# Patient Record
Sex: Male | Born: 1960 | Race: Black or African American | Hispanic: No | Marital: Single | Smoking: Current every day smoker
Health system: Southern US, Community
[De-identification: ages and names within clinical notes are randomized; demographics above are authoritative.]

## PROBLEM LIST (undated history)

## (undated) DIAGNOSIS — F1910 Other psychoactive substance abuse, uncomplicated: Secondary | ICD-10-CM

## (undated) DIAGNOSIS — R933 Abnormal findings on diagnostic imaging of other parts of digestive tract: Secondary | ICD-10-CM

## (undated) DIAGNOSIS — R109 Unspecified abdominal pain: Secondary | ICD-10-CM

## (undated) DIAGNOSIS — K219 Gastro-esophageal reflux disease without esophagitis: Secondary | ICD-10-CM

## (undated) HISTORY — PX: OTHER SURGICAL HISTORY: SHX169

---

## 2005-04-09 ENCOUNTER — Emergency Department (HOSPITAL_COMMUNITY): Admission: EM | Admit: 2005-04-09 | Discharge: 2005-04-09 | Payer: Self-pay | Admitting: Emergency Medicine

## 2006-12-05 ENCOUNTER — Emergency Department (HOSPITAL_COMMUNITY): Admission: EM | Admit: 2006-12-05 | Discharge: 2006-12-05 | Payer: Self-pay | Admitting: Emergency Medicine

## 2012-02-14 ENCOUNTER — Encounter (HOSPITAL_COMMUNITY): Payer: Self-pay | Admitting: Emergency Medicine

## 2012-02-14 ENCOUNTER — Emergency Department (HOSPITAL_COMMUNITY)
Admission: EM | Admit: 2012-02-14 | Discharge: 2012-02-15 | Disposition: A | Discharge status: Home / Self Care | Payer: Medicaid Other | Attending: Emergency Medicine | Admitting: Emergency Medicine

## 2012-02-14 DIAGNOSIS — M545 Low back pain, unspecified: Secondary | ICD-10-CM | POA: Insufficient documentation

## 2012-02-14 DIAGNOSIS — M25519 Pain in unspecified shoulder: Secondary | ICD-10-CM | POA: Insufficient documentation

## 2012-02-14 DIAGNOSIS — R109 Unspecified abdominal pain: Secondary | ICD-10-CM | POA: Insufficient documentation

## 2012-02-14 NOTE — ED Notes (Addendum)
Pt c/o intermittent right side abd pain that radiates to lower back and shoulder, that has been persistent for years, but pain has worsened over the last couple days.  Pain is worse during while sleeping and during movement.  Denies nausea and vomiting or burning on urination.

## 2012-02-15 MED ORDER — FENTANYL CITRATE 0.05 MG/ML IJ SOLN: 100.0000 ug | Freq: Once | INTRAMUSCULAR | Status: DC

## 2012-03-01 DIAGNOSIS — F1910 Other psychoactive substance abuse, uncomplicated: Secondary | ICD-10-CM

## 2012-03-01 DIAGNOSIS — R933 Abnormal findings on diagnostic imaging of other parts of digestive tract: Secondary | ICD-10-CM

## 2012-03-01 HISTORY — DX: Other psychoactive substance abuse, uncomplicated: F19.10

## 2012-03-01 HISTORY — DX: Abnormal findings on diagnostic imaging of other parts of digestive tract: R93.3

## 2012-03-02 ENCOUNTER — Emergency Department (HOSPITAL_COMMUNITY): Payer: Medicaid Other

## 2012-03-02 ENCOUNTER — Emergency Department (HOSPITAL_COMMUNITY)
Admission: EM | Admit: 2012-03-02 | Discharge: 2012-03-02 | Disposition: A | Discharge status: Home / Self Care | Payer: Medicaid Other | Attending: Emergency Medicine | Admitting: Emergency Medicine

## 2012-03-02 ENCOUNTER — Encounter (HOSPITAL_COMMUNITY): Payer: Self-pay | Admitting: Radiology

## 2012-03-02 DIAGNOSIS — D72829 Elevated white blood cell count, unspecified: Secondary | ICD-10-CM | POA: Insufficient documentation

## 2012-03-02 DIAGNOSIS — F17200 Nicotine dependence, unspecified, uncomplicated: Secondary | ICD-10-CM | POA: Insufficient documentation

## 2012-03-02 DIAGNOSIS — N390 Urinary tract infection, site not specified: Secondary | ICD-10-CM | POA: Insufficient documentation

## 2012-03-02 DIAGNOSIS — K219 Gastro-esophageal reflux disease without esophagitis: Secondary | ICD-10-CM

## 2012-03-02 LAB — URINALYSIS, ROUTINE W REFLEX MICROSCOPIC
Glucose, UA: NEGATIVE mg/dL
Hgb urine dipstick: NEGATIVE
Protein, ur: 100 mg/dL — AB
pH: 6 (ref 5.0–8.0)

## 2012-03-02 LAB — COMPREHENSIVE METABOLIC PANEL
ALT: 13 U/L (ref 0–53)
AST: 24 U/L (ref 0–37)
Albumin: 4 g/dL (ref 3.5–5.2)
CO2: 30 mEq/L (ref 19–32)
Calcium: 9.8 mg/dL (ref 8.4–10.5)
Chloride: 96 mEq/L (ref 96–112)
GFR calc non Af Amer: >90 mL/min (ref >90)
Sodium: 138 mEq/L (ref 135–145)
Total Bilirubin: 0.7 mg/dL (ref 0.3–1.2)

## 2012-03-02 LAB — RAPID URINE DRUG SCREEN, HOSP PERFORMED
Amphetamines: NOT DETECTED
Benzodiazepines: NOT DETECTED
Opiates: NOT DETECTED
Tetrahydrocannabinol: NOT DETECTED

## 2012-03-02 LAB — URINE MICROSCOPIC-ADD ON

## 2012-03-02 LAB — CBC WITH DIFFERENTIAL/PLATELET
Basophils Absolute: 0 10*3/uL (ref 0.0–0.1)
Lymphocytes Relative: 6 % — ABNORMAL LOW (ref 12–46)
Neutro Abs: 19.2 10*3/uL — ABNORMAL HIGH (ref 1.7–7.7)
Platelets: 162 10*3/uL (ref 150–400)
RDW: 12.8 % (ref 11.5–15.5)
WBC: 22.2 10*3/uL — ABNORMAL HIGH (ref 4.0–10.5)

## 2012-03-02 MED ORDER — SODIUM CHLORIDE 0.9 % IV BOLUS (SEPSIS)
2000.0000 mL | Freq: Once | INTRAVENOUS | Status: AC
  Administered 2012-03-02: 1000 mL via INTRAVENOUS

## 2012-03-02 MED ORDER — HYDROCODONE-ACETAMINOPHEN 5-325 MG PO TABS: 2.0000 | ORAL_TABLET | ORAL | Status: AC | PRN

## 2012-03-02 MED ORDER — HYDROMORPHONE HCL PF 1 MG/ML IJ SOLN
1.0000 mg | Freq: Once | INTRAMUSCULAR | Status: DC
  Filled 2012-03-02: qty 1

## 2012-03-02 MED ORDER — HYDROMORPHONE HCL PF 1 MG/ML IJ SOLN: 1.0000 mg | Freq: Once | INTRAMUSCULAR | Status: DC

## 2012-03-02 MED ORDER — NITROFURANTOIN MONOHYD MACRO 100 MG PO CAPS: 100.0000 mg | ORAL_CAPSULE | Freq: Two times a day (BID) | ORAL | Status: AC

## 2012-03-02 MED ORDER — HYDROMORPHONE HCL PF 1 MG/ML IJ SOLN
1.0000 mg | Freq: Once | INTRAMUSCULAR | Status: AC
  Administered 2012-03-02: 1 mg via INTRAMUSCULAR

## 2012-03-02 MED ORDER — PANTOPRAZOLE SODIUM 20 MG PO TBEC: 20.0000 mg | DELAYED_RELEASE_TABLET | Freq: Every day | ORAL | Status: DC

## 2012-03-02 MED ORDER — ONDANSETRON HCL 4 MG/2ML IJ SOLN
4.0000 mg | Freq: Once | INTRAMUSCULAR | Status: DC
  Filled 2012-03-02 (×2): qty 2

## 2012-03-02 MED ORDER — IOHEXOL 300 MG/ML  SOLN
20.0000 mL | INTRAMUSCULAR | Status: AC
  Administered 2012-03-02: 20 mL via ORAL

## 2012-03-02 MED ORDER — SODIUM CHLORIDE 0.9 % IV SOLN
INTRAVENOUS | Status: DC
  Administered 2012-03-02: 125 mL/h via INTRAVENOUS

## 2012-03-02 MED ORDER — HYDROMORPHONE HCL PF 1 MG/ML IJ SOLN
1.0000 mg | Freq: Once | INTRAMUSCULAR | Status: AC
  Administered 2012-03-02: 1 mg via INTRAVENOUS
  Filled 2012-03-02: qty 1

## 2012-03-02 MED ORDER — IOHEXOL 300 MG/ML  SOLN
80.0000 mL | Freq: Once | INTRAMUSCULAR | Status: AC | PRN
  Administered 2012-03-02: 80 mL via INTRAVENOUS

## 2012-03-02 NOTE — ED Notes (Signed)
Pt brought in via EMS from [facility] store where he was "hanging out" with nausea/vomiting x1 day. Pt requested pain medication from EMS and was denied Pain medication by EMS pt then refused to come to ED

## 2012-03-02 NOTE — ED Provider Notes (Signed)
History     CSN: [id number]  Arrival date & time 03/02/12  1324   First MD Initiated Contact with Patient 03/02/12 1505      Chief Complaint  Patient presents with  . Abdominal Pain    (Consider location/radiation/quality/duration/timing/severity/associated sxs/prior treatment) HPI Pt presenting with c/o epigastric pain as well as nausea and vomiting. He states the pain is sharp and constant.  Has been present for several weeks but became worse today.  Emesis is nonbilious and nonbloody.  No diarrhea.  No fever/chills.  States certain foods make symptoms worse like fruits and vegetables.  There are no other associated systemic symptoms, there are no other alleviating or modifying factors. He denes trying anything for pain prior to arrival.  Pt brought in by EMS.   History reviewed. No pertinent past medical history.  History reviewed. No pertinent past surgical history.  History reviewed. No pertinent family history.  History  Substance Use Topics  . Smoking status: Current Everyday Smoker  . Smokeless tobacco: Not on file  . Alcohol Use: Yes      Review of Systems ROS reviewed and all otherwise negative except for mentioned in HPI  Allergies  Review of patient's allergies indicates no known allergies.  Home Medications   Current Outpatient Rx  Name Route Sig Dispense Refill  . HYDROCODONE-ACETAMINOPHEN 5-325 MG PO TABS Oral Take 2 tablets by mouth every 4 (four) hours as needed for pain. 10 tablet 0  . NITROFURANTOIN MONOHYD MACRO 100 MG PO CAPS Oral Take 1 capsule (100 mg total) by mouth 2 (two) times daily. 10 capsule 0  . PANTOPRAZOLE SODIUM 20 MG PO TBEC Oral Take 1 tablet (20 mg total) by mouth daily. 30 tablet 0    BP 145/89  Pulse 93  Temp 98 F (36.7 C) (Oral)  Resp 18  SpO2 100% Vitals reviewed Physical Exam Physical Examination: General appearance - alert, uncomfortable appearing, and in no distress Mental status - alert, oriented to person, place,  and time Eyes - pupils equal and reactive, no conjunctival injection, no scleral icterus Mouth - mucous membranes moist, pharynx normal without lesions Chest - clear to auscultation, no wheezes, rales or rhonchi, symmetric air entry Heart - normal rate, regular rhythm, normal S1, S2, no murmurs, rubs, clicks or gallops Abdomen - soft, mild ttp in epigastric region, no gaurding or rebound tenderness, nondistended, no masses or organomegaly Extremities - peripheral pulses normal, no pedal edema, no clubbing or cyanosis Skin - normal coloration and turgor, no rashes, no suspicious skin lesions noted Psych- agitated, disgruntled  ED Course  Procedures (including critical care time)  3:09 PM went to see patient, he is not in room  4:58 PM  D/w Doran Durand, PA who will cover patient in CDU  Labs Reviewed  CBC WITH DIFFERENTIAL - Abnormal; Notable for the following:    WBC 22.2 (*)     Hemoglobin 17.1 (*)     Neutrophils Relative 87 (*)     Neutro Abs 19.2 (*)     Lymphocytes Relative 6 (*)     Monocytes Absolute 1.7 (*)     All other components within normal limits  COMPREHENSIVE METABOLIC PANEL - Abnormal; Notable for the following:    Glucose, Bld 107 (*)     All other components within normal limits  URINALYSIS, ROUTINE W REFLEX MICROSCOPIC - Abnormal; Notable for the following:    Color, Urine AMBER (*)  BIOCHEMICALS MAY BE AFFECTED BY COLOR   APPearance HAZY (*)  Bilirubin Urine SMALL (*)     Ketones, ur 15 (*)     Protein, ur 100 (*)     Leukocytes, UA SMALL (*)     All other components within normal limits  URINE RAPID DRUG SCREEN (HOSP PERFORMED) - Abnormal; Notable for the following:    Cocaine POSITIVE (*)     All other components within normal limits  URINE MICROSCOPIC-ADD ON - Abnormal; Notable for the following:    Casts HYALINE CASTS (*)     All other components within normal limits  LIPASE, BLOOD  ETHANOL  URINE CULTURE   Ct Abdomen Pelvis W  Contrast  03/02/2012  *RADIOLOGY REPORT*  Clinical Data: Abdominal pain.  Leukocytosis.  CT ABDOMEN AND PELVIS WITH CONTRAST  Technique:  Multidetector CT imaging of the abdomen and pelvis was performed following the standard protocol during bolus administration of intravenous contrast.  Contrast: 80mL OMNIPAQUE IOHEXOL 300 MG/ML  SOLN  Comparison: No priors.  Findings:  Lung Bases: Marked asymmetric circumferential thickening of the distal esophagus.  Abdomen/Pelvis:  The appearance of the liver, gallbladder, pancreas, spleen, bilateral adrenal glands and bilateral kidneys is unremarkable.  No ascites or pneumoperitoneum and no pathologic distension of bowel.  There is some moderate gaseous distension throughout the colon.  Prostate and urinary bladder are unremarkable in appearance.  Incompletely distended right testicle in the right inguinal canal.  Musculoskeletal: There are no aggressive appearing lytic or blastic lesions noted in the visualized portions of the skeleton.  IMPRESSION: 1.  No acute findings in the abdomen or pelvis to account for the patient's symptoms. 2. However, there is profound asymmetric thickening of the distal esophagus.  While this may simply relate to reflux esophagitis, clinical correlation is recommended.  If there is any clinical concern for Barrett's metaplasia or esophageal neoplasia, further evaluation with endoscopy may be warranted. 3.  Incompletely distended right testicle localized in the right inguinal canal.  This places the patient at increased risk for development of testicular carcinoma, and appropriate clinical surveillance is recommended in the future.  Original Report Authenticated By: Florencia Reasons, M.D.   Dg Abd Acute W/chest  03/02/2012  *RADIOLOGY REPORT*  Clinical Data: Chest pain.  ACUTE ABDOMEN SERIES (ABDOMEN 2 VIEW & CHEST 1 VIEW)  Comparison: None.  Findings: Heart is normal size.  Scarring in the left upper lobe. Right lung is clear.  No effusions.   Mild diffuse gaseous distention of bowel, predominately large bowel.  This may reflect mild ileus.  No free air organomegaly.  No suspicious calcification or acute bony abnormality.  IMPRESSION: Mild gaseous distention of bowel, possibly mild ileus.  Left apical scarring.  Original Report Authenticated By: Cyndie Chime, M.D.     1. GERD (gastroesophageal reflux disease)   2. UTI (urinary tract infection)       MDM  Patient with epigastric pain which has been going on for the past several weeks. He does have an elevated white blood cell count and CT scan was obtained which shows esophagitis. He also has white blood cells in his urine consistent with urinary tract infection. There are no other acute abnormalities on a CT scan or laboratory results. He was apprised of the abnormality in the distal esophagus and given information for followup with GI. He was also started on antibiotics for his urinary tract infection.        Ethelda Chick, MD 03/03/12 2059

## 2012-03-02 NOTE — ED Provider Notes (Signed)
Medical screening exam: Patient presents per EMS, for evaluation of right upper abdominal and flank pain present for several weeks. He asked EMS, for pain medicine. He was taken to triage, where he had a episode of possible syncope. After that, he was brought to the back. He then chose to lie on the floor. He is irritable, but answers questions. They're to family members with him were also somewhat irritable, but cooperative. Vital signs are normal. Screening evaluation is ordered. He'll be given medications for pain, and nausea during the course of the initial evaluation.  Flint Melter, MD 03/02/12 [phone]

## 2012-03-02 NOTE — ED Provider Notes (Signed)
6:15 PM Assumed care of patient in the CDU from Dr. Karma Ganja.  Patient presents today with a chief complaint of nausea, vomiting, and abdominal pain for one day. Patient found to have an elevated WBC of 22, 000 on CBC.   Plan is for patient to get an Ab/Pelvis CT.  Dispo will be determined once the CT has been resulted.  Reassessed patient.  He reports that he is still having pain.  Patient alert and orientated, Heart RRR, Lungs CTAB, Abdomen soft with tenderness to palpation of the epigastrium, No rebound or guarding.    7:45 PM Results of the CT as follows:  1.  No acute findings in the abdomen or pelvis to account for the  patient's symptoms.  2. However, there is profound asymmetric thickening of the distal  esophagus. While this may simply relate to reflux esophagitis,  clinical correlation is recommended. If there is any clinical  concern for Barrett's metaplasia or esophageal neoplasia, further  evaluation with endoscopy may be warranted.  Discussed results of the CT with patient.  Patient will be discharged home with antibiotics to treat UTI and will be discharged with Protonix for GERD.    Pascal Lux [place], PA-C 03/03/12 0149  Pascal Lux [place], PA-C 03/03/12 [phone]

## 2012-03-03 NOTE — ED Provider Notes (Signed)
Medical screening examination/treatment/procedure(s) were conducted as a shared visit with non-physician practitioner(s) and myself.  I personally evaluated the patient during the encounter  I saw this patient initially.   Ethelda Chick, MD 03/03/12 1745

## 2012-03-04 LAB — URINE CULTURE
Colony Count: NO GROWTH
Culture: NO GROWTH

## 2012-08-09 ENCOUNTER — Encounter (HOSPITAL_COMMUNITY): Admission: EM | Disposition: A | Discharge status: Home / Self Care | Payer: Self-pay | Source: Home / Self Care

## 2012-08-09 ENCOUNTER — Encounter (HOSPITAL_COMMUNITY): Payer: Self-pay | Admitting: Emergency Medicine

## 2012-08-09 ENCOUNTER — Inpatient Hospital Stay (HOSPITAL_COMMUNITY)
Admission: EM | Admit: 2012-08-09 | Discharge: 2012-08-20 | DRG: 326 | Disposition: A | Discharge status: Home / Self Care | Payer: Medicaid Other | Attending: General Surgery | Admitting: General Surgery

## 2012-08-09 ENCOUNTER — Emergency Department (HOSPITAL_COMMUNITY): Payer: Medicaid Other

## 2012-08-09 DIAGNOSIS — K264 Chronic or unspecified duodenal ulcer with hemorrhage: Principal | ICD-10-CM | POA: Diagnosis present

## 2012-08-09 DIAGNOSIS — K922 Gastrointestinal hemorrhage, unspecified: Secondary | ICD-10-CM

## 2012-08-09 DIAGNOSIS — F1010 Alcohol abuse, uncomplicated: Secondary | ICD-10-CM

## 2012-08-09 DIAGNOSIS — D62 Acute posthemorrhagic anemia: Secondary | ICD-10-CM | POA: Diagnosis not present

## 2012-08-09 DIAGNOSIS — R55 Syncope and collapse: Secondary | ICD-10-CM

## 2012-08-09 DIAGNOSIS — K269 Duodenal ulcer, unspecified as acute or chronic, without hemorrhage or perforation: Secondary | ICD-10-CM

## 2012-08-09 DIAGNOSIS — R578 Other shock: Secondary | ICD-10-CM | POA: Diagnosis present

## 2012-08-09 DIAGNOSIS — F17200 Nicotine dependence, unspecified, uncomplicated: Secondary | ICD-10-CM | POA: Diagnosis present

## 2012-08-09 DIAGNOSIS — K920 Hematemesis: Secondary | ICD-10-CM

## 2012-08-09 DIAGNOSIS — J9600 Acute respiratory failure, unspecified whether with hypoxia or hypercapnia: Secondary | ICD-10-CM

## 2012-08-09 DIAGNOSIS — I10 Essential (primary) hypertension: Secondary | ICD-10-CM | POA: Diagnosis present

## 2012-08-09 DIAGNOSIS — K219 Gastro-esophageal reflux disease without esophagitis: Secondary | ICD-10-CM | POA: Diagnosis present

## 2012-08-09 DIAGNOSIS — D500 Iron deficiency anemia secondary to blood loss (chronic): Secondary | ICD-10-CM

## 2012-08-09 DIAGNOSIS — I498 Other specified cardiac arrhythmias: Secondary | ICD-10-CM | POA: Diagnosis present

## 2012-08-09 DIAGNOSIS — Z9889 Other specified postprocedural states: Secondary | ICD-10-CM

## 2012-08-09 DIAGNOSIS — I959 Hypotension, unspecified: Secondary | ICD-10-CM

## 2012-08-09 DIAGNOSIS — G9340 Encephalopathy, unspecified: Secondary | ICD-10-CM

## 2012-08-09 HISTORY — DX: Unspecified abdominal pain: R10.9

## 2012-08-09 HISTORY — DX: Abnormal findings on diagnostic imaging of other parts of digestive tract: R93.3

## 2012-08-09 HISTORY — DX: Gastro-esophageal reflux disease without esophagitis: K21.9

## 2012-08-09 HISTORY — DX: Other psychoactive substance abuse, uncomplicated: F19.10

## 2012-08-09 LAB — BASIC METABOLIC PANEL
BUN: 31 mg/dL — ABNORMAL HIGH (ref 6–23)
Calcium: 7.7 mg/dL — ABNORMAL LOW (ref 8.4–10.5)
GFR calc Af Amer: 70 mL/min — ABNORMAL LOW (ref >90)
GFR calc non Af Amer: 60 mL/min — ABNORMAL LOW (ref >90)
Glucose, Bld: 133 mg/dL — ABNORMAL HIGH (ref 70–99)
Sodium: 141 mEq/L (ref 135–145)

## 2012-08-09 LAB — HEPATIC FUNCTION PANEL
Bilirubin, Direct: <0.1 mg/dL (ref 0.0–0.3)
Total Bilirubin: 0.1 mg/dL — ABNORMAL LOW (ref 0.3–1.2)

## 2012-08-09 LAB — URINALYSIS, ROUTINE W REFLEX MICROSCOPIC
Nitrite: NEGATIVE
Protein, ur: NEGATIVE mg/dL
Specific Gravity, Urine: 1.023 (ref 1.005–1.030)
Urobilinogen, UA: 1 mg/dL (ref 0.0–1.0)

## 2012-08-09 LAB — LIPASE, BLOOD: Lipase: 31 U/L (ref 11–59)

## 2012-08-09 LAB — CBC WITH DIFFERENTIAL/PLATELET
Basophils Relative: 0 % (ref 0–1)
Eosinophils Absolute: 0.1 10*3/uL (ref 0.0–0.7)
Eosinophils Relative: 1 % (ref 0–5)
Lymphs Abs: 1.9 10*3/uL (ref 0.7–4.0)
MCH: 26 pg (ref 26.0–34.0)
MCHC: 33.1 g/dL (ref 30.0–36.0)
MCV: 78.4 fL (ref 78.0–100.0)
Neutrophils Relative %: 79 % — ABNORMAL HIGH (ref 43–77)
Platelets: 126 10*3/uL — ABNORMAL LOW (ref 150–400)
RBC: 2.27 MIL/uL — ABNORMAL LOW (ref 4.22–5.81)

## 2012-08-09 LAB — POCT I-STAT TROPONIN I: Troponin i, poc: 0.01 ng/mL (ref 0.00–0.08)

## 2012-08-09 LAB — TROPONIN I: Troponin I: <0.3 ng/mL (ref <0.30)

## 2012-08-09 LAB — PROTIME-INR
INR: 1.35 (ref 0.00–1.49)
Prothrombin Time: 16.4 seconds — ABNORMAL HIGH (ref 11.6–15.2)

## 2012-08-09 LAB — URINE MICROSCOPIC-ADD ON

## 2012-08-09 LAB — ETHANOL: Alcohol, Ethyl (B): <11 mg/dL (ref 0–11)

## 2012-08-09 LAB — PREPARE RBC (CROSSMATCH)

## 2012-08-09 SURGERY — EGD (ESOPHAGOGASTRODUODENOSCOPY)
Anesthesia: Moderate Sedation

## 2012-08-09 MED ORDER — LIDOCAINE VISCOUS 2 % MT SOLN
20.0000 mL | Freq: Once | OROMUCOSAL | Status: AC
  Administered 2012-08-09: 20 mL via OROMUCOSAL
  Filled 2012-08-09: qty 15

## 2012-08-09 MED ORDER — THIAMINE HCL 100 MG/ML IJ SOLN
100.0000 mg | Freq: Every day | INTRAMUSCULAR | Status: DC
  Administered 2012-08-11 – 2012-08-12 (×2): 100 mg via INTRAVENOUS
  Filled 2012-08-09 (×3): qty 1

## 2012-08-09 MED ORDER — SODIUM CHLORIDE 0.9 % IV SOLN
INTRAVENOUS | Status: DC
  Administered 2012-08-09 – 2012-08-10 (×2): via INTRAVENOUS
  Administered 2012-08-10: 200 mL/h via INTRAVENOUS
  Administered 2012-08-12: 09:00:00 via INTRAVENOUS
  Administered 2012-08-13: 20 mL/h via INTRAVENOUS
  Administered 2012-08-16 – 2012-08-18 (×2): via INTRAVENOUS

## 2012-08-09 MED ORDER — OXYMETAZOLINE HCL 0.05 % NA SOLN
1.0000 | Freq: Once | NASAL | Status: AC
  Administered 2012-08-09: 1 via NASAL
  Filled 2012-08-09: qty 15

## 2012-08-09 MED ORDER — FOLIC ACID 1 MG PO TABS
1.0000 mg | ORAL_TABLET | Freq: Every day | ORAL | Status: DC
  Filled 2012-08-09 (×3): qty 1

## 2012-08-09 MED ORDER — MORPHINE SULFATE 4 MG/ML IJ SOLN
4.0000 mg | Freq: Once | INTRAMUSCULAR | Status: AC
  Administered 2012-08-09: 4 mg via INTRAVENOUS
  Filled 2012-08-09: qty 1

## 2012-08-09 MED ORDER — SODIUM CHLORIDE 0.9 % IV SOLN
8.0000 mg/h | INTRAVENOUS | Status: AC
  Administered 2012-08-09 – 2012-08-15 (×11): 8 mg/h via INTRAVENOUS
  Filled 2012-08-09 (×26): qty 80

## 2012-08-09 MED ORDER — SODIUM CHLORIDE 0.9 % IV BOLUS (SEPSIS)
1000.0000 mL | Freq: Once | INTRAVENOUS | Status: AC
  Administered 2012-08-09: 1000 mL via INTRAVENOUS

## 2012-08-09 MED ORDER — VITAMIN B-1 100 MG PO TABS
100.0000 mg | ORAL_TABLET | Freq: Every day | ORAL | Status: DC
  Filled 2012-08-09 (×3): qty 1

## 2012-08-09 MED ORDER — PROMETHAZINE HCL 25 MG PO TABS
12.5000 mg | ORAL_TABLET | Freq: Four times a day (QID) | ORAL | Status: DC | PRN
  Administered 2012-08-19: 12.5 mg via ORAL
  Filled 2012-08-09: qty 1

## 2012-08-09 MED ORDER — LORAZEPAM 1 MG PO TABS: 1.0000 mg | ORAL_TABLET | Freq: Four times a day (QID) | ORAL | Status: DC | PRN

## 2012-08-09 MED ORDER — SODIUM CHLORIDE 0.9 % IJ SOLN
3.0000 mL | Freq: Two times a day (BID) | INTRAMUSCULAR | Status: DC
  Administered 2012-08-10: 10 mL via INTRAVENOUS
  Administered 2012-08-11 – 2012-08-12 (×3): 3 mL via INTRAVENOUS

## 2012-08-09 MED ORDER — PANTOPRAZOLE SODIUM 40 MG IV SOLR
40.0000 mg | Freq: Once | INTRAVENOUS | Status: AC
  Administered 2012-08-09: 40 mg via INTRAVENOUS
  Filled 2012-08-09: qty 40

## 2012-08-09 MED ORDER — ADULT MULTIVITAMIN W/MINERALS CH
1.0000 | ORAL_TABLET | Freq: Every day | ORAL | Status: AC
  Filled 2012-08-09 (×3): qty 1

## 2012-08-09 MED ORDER — MORPHINE SULFATE 2 MG/ML IJ SOLN: 0.5000 mg | INTRAMUSCULAR | Status: DC | PRN

## 2012-08-09 MED ORDER — LORAZEPAM 2 MG/ML IJ SOLN
1.0000 mg | Freq: Four times a day (QID) | INTRAMUSCULAR | Status: AC | PRN
  Administered 2012-08-12: 1 mg via INTRAVENOUS
  Filled 2012-08-09: qty 1

## 2012-08-09 MED ORDER — GI COCKTAIL ~~LOC~~
30.0000 mL | Freq: Once | ORAL | Status: AC
  Administered 2012-08-09: 30 mL via ORAL
  Filled 2012-08-09: qty 30

## 2012-08-09 NOTE — H&P (Signed)
[facility] History and Physical  Jeffree Cazeau ZOX:[id number] DOB: 1961-03-04 DOA: 08/09/2012  Referring physician: Anitra Lauth PCP: Default, Provider, MD  Specialists: GI, already aware of pt.  Chief Complaint: Vomiting blood  HPI: [name] is a 52 y.o. male is an AAM who started having GI upset since 07/31/12-he had an episode of emesis on that day and then on new years day as well.  THis is the first time this has happened.  He states he came here in August and was given some medicaiton fr this which "Did him no good and made him worse" Patient states his voice is hoarse and he is not very communicative [although able to] and as such a full history is hard to complete verbally. He states he had a total of 2 vomiting episodes on 12/31, then another on 1.1.14-he states he continued to have some malaise with mild abdominal discomfort  And a finla non-bloody emesis on 1.7.14 and was spitting up phlegm with this.  On 1/9 am at 01:00 he developed dark bloody emesis and more diaphoresis that "filled the toilet bowl'  He describes darkening of stool over past 1-2 days. He reports he drinks 1-2 beers daily-he quantifies this as drinking about 40 ounces daily but his last drink was 12.31   Review of Systems: The patient denies CP, states he has RUQ pains, + N/+ dark emesis, + dark stool, no CP, no blurred vision, has felt dizzy, has no feer, no chills, no cough or cold   Past Medical History  Diagnosis Date  . Abdominal  pain, other specified site   . GERD (gastroesophageal reflux disease)    History reviewed. No pertinent past surgical history. Social History:  reports that he has been smoking.  He does not have any smokeless tobacco history on file. He reports that he drinks alcohol. He reports that he uses illicit drugs (Marijuana).  No Known Allergies  History reviewed. No pertinent family history. Patient not agreeable to discussing PMH/PSH  Prior to Admission medications   Not on  File   Physical Exam: Filed Vitals:   08/09/12 1345 08/09/12 1430 08/09/12 1543 08/09/12 1557  BP: 104/44 101/49 95/49 104/62  Pulse: 90 88  96  Temp: 97.7 F (36.5 C)  98.1 F (36.7 C) 98.1 F (36.7 C)  TempSrc: Oral  Oral Oral  Resp: 16 20  17   SpO2: 100% 100%       General:  Alert, oriented but sleepy AAM.    Eyes: eomi, some pallor, no ict  ENT: poor dentition  Neck:  Soft, visible Carotid pulsations  Cardiovascular: s1 s2 tachycardic, rate ~ 110  Respiratory: Clear   Abdomen: tender on the RUQ, no Murphy's-states paroxysms of pain made better by laying on that side  Skin: nad  Musculoskeletal: good ROM  Psychiatric: disgruntled and not cooperative  Neurologic:  Grossly intact and follow commands  Labs on Admission:  Basic Metabolic Panel:  Lab 08/09/12 8119  NA 141  K 3.4*  CL 103  CO2 28  GLUCOSE 133*  BUN 31*  CREATININE 1.33  CALCIUM 7.7*  MG --  PHOS --   Liver Function Tests:  Lab 08/09/12 1356  AST 13  ALT 7  ALKPHOS 36*  BILITOT 0.1*  PROT 4.3*  ALBUMIN 2.1*   No results found for this basename: LIPASE:5,AMYLASE:5 in the last 168 hours No results found for this basename: AMMONIA:5 in the last 168 hours CBC:  Lab 08/09/12 1356  WBC 13.0*  NEUTROABS  10.2*  HGB 5.9*  HCT 17.8*  MCV 78.4  PLT 126*   Cardiac Enzymes:  Lab 08/09/12 1402  CKTOTAL --  CKMB --  CKMBINDEX --  TROPONINI <0.30    BNP (last 3 results) No results found for this basename: PROBNP:3 in the last 8760 hours CBG: No results found for this basename: GLUCAP:5 in the last 168 hours  Radiological Exams on Admission: No results found.  EKG: Independently reviewed. Sinus tachycardia ~ 100, PR interval 0.04 QRS axis appears to be 90 with peak T waves in V2 and possible rate related ST-T wave elevations, however there appears to also be some LVH.  Assessment/Plan Active Problems:  * No active hospital problems. *    1. Likely acute gastrointestinal  bleed from gastritis and chronic ethanol use-continue fluid resuscitation, agreed with plan delineated by ED. Patient has a received one unit of packed red blood cells, Protonix drip has been started, NG tube is in place yielding 500 cc of dark blood. An INR has been ordered by EDP. Gastroenterology is aware of patient-B. and creatinine ratio suggests uremia secondary to breakdown of blood l commonly seen with GI bleed of upper etiology.   Patient will be transferred to the step down unit for close monitoring. 2. Abdominal pain-most likely secondary to gastritis, however cannot rule out perforated esophagus with acute episodes of vomiting. Abdominal portable shows no acute findings 3. Hypovolemic shock-on initial presentation blood pressure was 80 systolic and patient was symptomatic -secondary to GI bleed. Maintain IV fluids at 200 cc per hour. Will consult critical care for him to be aware of patient in case he decompensates 4. Leukocytosis-secondary to #1  5. Sinus tachycardia-potentially related to acute hypotension and physiologic in nature. 6. Abnormal EKG-probably has some LVH.  7. ? Altered LFTs-alkaline phosphatase is 36 apneas 2.1 total protein 4.3 bilirubin 0.1.-Patient potentially has cirrhosis based on poor production of these enzymes  Dr Christella Hartigan aware of patient and has already been seen by PA for GI  Code Status: DNAR  Family Communication: none  Disposition Plan: sdu  Time spent: 30  Mahala Menghini [facility] Pager [phone]  If 7PM-7AM, please contact night-coverage [email or web address] Password [email or web address] 08/09/2012, 4:16 PM

## 2012-08-09 NOTE — ED Provider Notes (Signed)
History     CSN: [id number]  Arrival date & time 08/09/12  1332   First MD Initiated Contact with Patient 08/09/12 1353      Chief Complaint  Patient presents with  . Loss of Consciousness    (Consider location/radiation/quality/duration/timing/severity/associated sxs/prior treatment) Patient is a 52 y.o. male presenting with syncope. The history is provided by the patient.  Loss of Consciousness This is a new problem. Episode onset: unknown. states he passed out in the yard last night after vomiting blood. The problem occurs constantly. The problem has been resolved. Associated symptoms include abdominal pain. Pertinent negatives include no chest pain, no headaches and no shortness of breath. Associated symptoms comments: States last night he was having abdominal pain, vomiting blood and diarrhea which has completely resolved. Denies any nausea or abdominal pain now.. Nothing aggravates the symptoms. Nothing relieves the symptoms. He has tried nothing for the symptoms. The treatment provided significant relief.    Past Medical History  Diagnosis Date  . Abdominal  pain, other specified site   . GERD (gastroesophageal reflux disease)     History reviewed. No pertinent past surgical history.  History reviewed. No pertinent family history.  History  Substance Use Topics  . Smoking status: Current Every Day Smoker  . Smokeless tobacco: Not on file  . Alcohol Use: Yes      Review of Systems  Constitutional: Positive for appetite change. Negative for fever.  Respiratory: Negative for shortness of breath.   Cardiovascular: Positive for syncope. Negative for chest pain.  Gastrointestinal: Positive for nausea, vomiting, abdominal pain and diarrhea.       Vomiting blood last night. No vomiting today  Neurological: Negative for headaches.  All other systems reviewed and are negative.    Allergies  Review of patient's allergies indicates no known allergies.  Home Medications     Current Outpatient Rx  Name  Route  Sig  Dispense  Refill  . PANTOPRAZOLE SODIUM 20 MG PO TBEC   Oral   Take 1 tablet (20 mg total) by mouth daily.   30 tablet   0     BP 104/44  Pulse 90  Temp 97.7 F (36.5 C) (Oral)  Resp 16  SpO2 100%  Physical Exam  Nursing note and vitals reviewed. Constitutional: He is oriented to person, place, and time. He appears well-developed and well-nourished. No distress.  HENT:  Head: Normocephalic and atraumatic.  Mouth/Throat: Oropharynx is clear and moist. Mucous membranes are dry. No oropharyngeal exudate, posterior oropharyngeal edema or posterior oropharyngeal erythema.  Eyes: Conjunctivae normal and EOM are normal. Pupils are equal, round, and reactive to light.  Neck: Normal range of motion. Neck supple.  Cardiovascular: Normal rate, regular rhythm and intact distal pulses.   No murmur heard. Pulmonary/Chest: Effort normal and breath sounds normal. No stridor. No respiratory distress. He has no wheezes. He has no rales.  Abdominal: Soft. He exhibits no distension. There is no tenderness. There is no rebound and no guarding.  Musculoskeletal: Normal range of motion. He exhibits no edema and no tenderness.  Neurological: He is alert and oriented to person, place, and time.  Skin: Skin is warm and dry. No rash noted. No erythema.  Psychiatric: He has a normal mood and affect. His behavior is normal.    ED Course  Procedures (including critical care time)  Labs Reviewed  CBC WITH DIFFERENTIAL - Abnormal; Notable for the following:    WBC 13.0 (*)     RBC 2.27 (*)  Hemoglobin 5.9 (*)     HCT 17.8 (*)     RDW 17.5 (*)     Platelets 126 (*)     Neutrophils Relative 79 (*)     Neutro Abs 10.2 (*)     All other components within normal limits  BASIC METABOLIC PANEL - Abnormal; Notable for the following:    Potassium 3.4 (*)     Glucose, Bld 133 (*)     BUN 31 (*)     Calcium 7.7 (*)     GFR calc non Af Amer 60 (*)     GFR  calc Af Amer 70 (*)     All other components within normal limits  HEPATIC FUNCTION PANEL - Abnormal; Notable for the following:    Total Protein 4.3 (*)     Albumin 2.1 (*)     Alkaline Phosphatase 36 (*)     Total Bilirubin 0.1 (*)     All other components within normal limits  ETHANOL  TROPONIN I  POCT I-STAT TROPONIN I  TYPE AND SCREEN  URINALYSIS, ROUTINE W REFLEX MICROSCOPIC  URINE RAPID DRUG SCREEN (HOSP PERFORMED)  LIPASE, BLOOD  PREPARE RBC (CROSSMATCH)  PREPARE FRESH FROZEN PLASMA   No results found.   Date: 08/09/2012  Rate: 96  Rhythm: normal sinus rhythm  QRS Axis: normal  Intervals: Prolonged QT  ST/T Wave abnormalities: nonspecific T wave changes and ST depressions inferiorly  Conduction Disutrbances:none  Narrative Interpretation:   Old EKG Reviewed: none available  CRITICAL CARE Performed by: Gwyneth Sprout   Total critical care time: 30  Critical care time was exclusive of separately billable procedures and treating other patients.  Critical care was necessary to treat or prevent imminent or life-threatening deterioration.  Critical care was time spent personally by me on the following activities: development of treatment plan with patient and/or surrogate as well as nursing, discussions with consultants, evaluation of patient's response to treatment, examination of patient, obtaining history from patient or surrogate, ordering and performing treatments and interventions, ordering and review of laboratory studies, ordering and review of radiographic studies, pulse oximetry and re-evaluation of patient's condition.   No diagnosis found.    MDM   Patient complaining of abdominal pain and vomiting starting last night. States that he had 2 episodes of syncope. States he drinks 2 beers a day but denies heavy alcohol use last night. Patient states now his throat hurts but he denies any chest pain, shortness of breath, abdominal pain, no nausea or  vomiting now. Patient states he has no medical problems except for GERD which she has been here in the past 4. He takes no medications. He denies any allergies. On exam he has no abdominal tenderness and complaints of sore throat however his throat is unrevealing and he has no signs of stridor or other suspicious findings.  Per EMS patient had multiple episodes of vomiting over the last 3 days and went and sat up with them today he syncopized which resolved when I laid him down with a hypotensive blood pressure of 80s.  Pt also states vomiting blood this am which is new.  No prior hx of GI bleed or ulcers.  EKG shows some mild ST depression inferiorly with a prolonged QT. No prior to compare.  Patient symptoms may be GI related however could also be cardiac related. CBC, CMP, UA, EtOH, UDS, troponin, chest x-ray pending  CBC with hb of 5.9 and the patient attempted to sit up he became near  syncopal. NG tube will be placed to evaluate for current active bleeding. Patient was started on protonix bolus and drip.  We'll transfuse 3 units of blood. EKG with prolonged QT but negative troponin.  3:33 PM Patient becoming more hypotensive and responding some time the fluids however patient needs blood. He was given 1 unit of emergency release blood while his type and screen was pending.  Due to transfusing 3 units of blood will also give a unit of FFP   NG tube with 500 cc of dark blood but no active bleeding. Will discuss the case with GI.   Gwyneth Sprout, MD 08/09/12 1540

## 2012-08-09 NOTE — Consult Note (Signed)
[facility] Consult: 4:37 PM 08/09/2012   Referring Provider: ED MD: Anitra Lauth Primary Care Physician:   Primary Gastroenterologist:  none  Reason for Consultation:  [name] bleeding  HPI: [name] is a 52 y.o. male.   In 03/2012 had upper abdominal pain and CT showed "profound asymmetric thickening of the distal esophagus".  He took rx'd PPI, Pantoprazole 20 mg q day until it ran out ~ 05/2012.  He never followed up with a GI MD as recommended by ED staff. Hgb then was 17.1 Describes gastric distress with n/v if he eats fatty or spicey foods.  Started vomiting Tuesday through today.  Yesterday vomit turned maroon, stool  Formed and darker but not bloody.  This AM with hematemesis persisting, he developed syncope.  EMS brought him to ED.  Hgb is 5.9.  BUN is 31.  Maroon, bloody material coming out an NGT.  3 units blood ordered for Hgb of 5.9 and FFP also ordered. However no PPI resulted at present.     ROS Pt has no hx of GI bleed or transfusions.  No dysphagia, no weight loss.  Drinks about 20 to 40 oz malt liquor daily, smokes 2 cigs per day.  No recent street drugs.  No ETOH binging. No abdominal pain.  No chest pain or SOB. No difficulty with urination.  Vision is blurry for distance objects.   Quit school at 30, in 11th grade.  Unable to read very well.      Past Medical History  Diagnosis Date  . Abdominal  pain, other specified site   . GERD (gastroesophageal reflux disease)   . Abnormal CT scan, esophagus 03/2012  . Substance abuse 03/2012    tox screen positive for cocaine.     No previous surgical procedures.  Undescended right testicle on CT of 03/2012. Has not had GU evaluation.   Prior to Admission medications   Not on File    Scheduled Meds:   Infusions:    . pantoprozole (PROTONIX) infusion     PRN Meds:    Allergies as of 08/09/2012  . (No Known Allergies)    History reviewed. No pertinent family  history.  History   Social History  . Marital Status: Single    Spouse Name: N/A    Number of Children: N/A  . Years of Education: N/A   Occupational History  . Not on file.   Social History Main Topics  . Smoking status: Current Every Day Smoker  . Smokeless tobacco: Not on file  . Alcohol Use: Yes  . Drug Use: Yes    Special: Marijuana  . Sexually Active:    Other Topics Concern  . Not on file   Social History Narrative  . Lives with friends     PHYSICAL EXAM: Vital signs in last 24 hours: Temp:  [97.7 F (36.5 C)-98.2 F (36.8 C)] 98.2 F (36.8 C) (01/09 1616) Pulse Rate:  [88-96] 96  (01/09 1616) Resp:  [16-20] 18  (01/09 1616) BP: (95-109)/(44-62) 109/56 mmHg (01/09 1616) SpO2:  [100 %] 100 % (01/09 1430)  General: pale, somewhat ill looking AAM.  NAD Head:  No signs of trauma  Eyes:  No icterus, no pallor.  Eyes sensitive to light Ears:  Not HOH  Nose:  No bleeding or congestion.  NGT with burgundy bloody suctionate. Mouth:  Poor dentition.  Tongue and oral MM clear.  Tongue mid-line Neck:  No mass, no JVD Lungs:  Clear.  No resp distress Heart: RRR.  No MRG Abdomen:  Soft, no mass, no HSM.  No bruits.  BS active..   Rectal: deferred   Musc/Skeltl: no joint swelling or edema Extremities:  No pedal edema  Neurologic:  No asterixis, no tremor.  Moves all 4s Skin:  No rash.  No sores.  No telangectasia.  Tattoos:  Small tatoo, homemade looking, on left forarm Nodes:  No adenopathy at groin or neck   Psych:  Pleasant, cooperative, appropriate.   Intake/Output from previous day:   Intake/Output this shift: Total I/O In: 302 [Blood:302] Out: -   LAB RESULTS:  Basename 08/09/12 1356  WBC 13.0*  HGB 5.9*  HCT 17.8*  PLT 126*   BMET Lab Results  Component Value Date   NA 141 08/09/2012   NA 138 03/02/2012   K 3.4* 08/09/2012   K 4.0 03/02/2012   CL 103 08/09/2012   CL 96 03/02/2012   CO2 28 08/09/2012   CO2 30 03/02/2012   GLUCOSE 133* 08/09/2012    GLUCOSE 107* 03/02/2012   BUN 31* 08/09/2012   BUN 14 03/02/2012   CREATININE 1.33 08/09/2012   CREATININE 0.98 03/02/2012   CALCIUM 7.7* 08/09/2012   CALCIUM 9.8 03/02/2012   LFT  Basename 08/09/12 1356  PROT 4.3*  ALBUMIN 2.1*  AST 13  ALT 7  ALKPHOS 36*  BILITOT 0.1*  BILIDIR <0.1  IBILI NOT CALCULATED   PT/INR No results found for this basename: INR,  PROTIME    Drugs of Abuse     Component Value Date/Time   LABOPIA NONE DETECTED 03/02/2012 1511   COCAINSCRNUR POSITIVE* 03/02/2012 1511   LABBENZ NONE DETECTED 03/02/2012 1511   AMPHETMU NONE DETECTED 03/02/2012 1511   THCU NONE DETECTED 03/02/2012 1511   LABBARB NONE DETECTED 03/02/2012 1511     RADIOLOGY STUDIES: 03/02/12  CT ABDOMEN AND PELVIS WITH CONTRAST IMPRESSION:  1. No acute findings in the abdomen or pelvis to account for the  patient's symptoms.  2. However, there is profound asymmetric thickening of the distal  esophagus. While this may simply relate to reflux esophagitis,  clinical correlation is recommended. If there is any clinical  concern for Barrett's metaplasia or esophageal neoplasia, further  evaluation with endoscopy may be warranted.  3. Incompletely distended right testicle localized in the right  inguinal canal. This places the patient at increased risk for  development of testicular carcinoma, and appropriate clinical  surveillance is recommended in the future.    ENDOSCOPIC STUDIES: None ever  IMPRESSION: *  UGIB.  Rule out ulcers. R/o cancer in esophagus.  R/o Mallory Weiss tear.  *  Symptomatic (syncope) ABL anemia.  3 units PRBCs and one unit FFP ordered.  *  Hyperglycemia.   PLAN: *  EGD but need PT/INR before deciding timing of this.  *  Agree with Protonix drip, transfusions though 2 units might be sufficient.  *  Needs coags tested   LOS: 0 days   Jennye Moccasin  08/09/2012, 4:37 PM Pager: [phone]    ________________________________________________________________________  [facility] MD  note:  I personally examined the patient, reviewed the data and agree with the assessment and plan described above.  I am seeing him at 8pm.  He has received 1 unit of PRBC so far and 1 unit FFP.  His BP is 115/80, HR 82.  I attatched his NG tube to suction cannister in room and there was dark maroon blood, no red blood.    He is HD stable.  Already on PPI  ggts.  Needs the remaining two units of PRBC to be transfused. Will plan on EGD tomorrow AM, sooner if needed.   Rob Bunting, MD [facility] Pager [phone]

## 2012-08-09 NOTE — ED Notes (Signed)
Pt currently denies abdominal pain. Pt states at night his right side of abdomen will hurt. Pt states woke up today and was sweaty. Pt states saw blood in vomit today. Pt mentating appropriately.

## 2012-08-09 NOTE — ED Notes (Signed)
Report given to [place], RN on floor. Nurse has no further questions upon report given. Pt being prepared for transport to floor.

## 2012-08-09 NOTE — Progress Notes (Addendum)
Re-evaluated patient.  Receiving Protonix.  Blood pressure still low.  Ordered IVF 200 cc/hr.  Again asked patient if he were ever to get sick what would he wish for [place] to do in that scenario.  HE states "do everything"  I explained carefully to him the risks of GI bleed and risks for decompensation and he understands and wishes to be FULL CODE.  Will alert PCCM to him being here if he decompensates-Alerted Dr. Delton Coombes of ccm who is aware.  ALso alerted Ms Georgia Dom of Hospitalist service to check in on him later tomight.  Pleas Koch, MD [facility] [phone]

## 2012-08-09 NOTE — ED Notes (Signed)
Pt denies numbness and tingling. Pt denies dizziness and lightheadedness.
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
## 2012-08-09 NOTE — ED Notes (Addendum)
Pt arrives via EMS with reported frequent episodes of syncope over the last week. On EMS arrival pt was diaphoretic, responsive to pain only while sitting. Pt laid flat and began talking. Pt immediately syncopized upon sitting up. BP systolic in 80s, supine. Pt also c/o sore throat and vomiting last 3 days, pt noted blood in vomiting. C/o pain across upper abdomen radiating to right side and flank. CBG 163. 18GIV LAC. 500cc IVF PTA

## 2012-08-09 NOTE — ED Notes (Signed)
MD at bedside. 

## 2012-08-10 ENCOUNTER — Encounter (HOSPITAL_COMMUNITY): Admission: EM | Disposition: A | Discharge status: Home / Self Care | Payer: Self-pay | Source: Home / Self Care

## 2012-08-10 ENCOUNTER — Inpatient Hospital Stay (HOSPITAL_COMMUNITY): Payer: Medicaid Other

## 2012-08-10 ENCOUNTER — Encounter (HOSPITAL_COMMUNITY): Payer: Self-pay | Admitting: *Deleted

## 2012-08-10 ENCOUNTER — Encounter (HOSPITAL_COMMUNITY): Payer: Self-pay | Admitting: Certified Registered"

## 2012-08-10 ENCOUNTER — Inpatient Hospital Stay (HOSPITAL_COMMUNITY): Payer: Medicaid Other | Admitting: Certified Registered"

## 2012-08-10 DIAGNOSIS — J9600 Acute respiratory failure, unspecified whether with hypoxia or hypercapnia: Secondary | ICD-10-CM | POA: Diagnosis not present

## 2012-08-10 DIAGNOSIS — K269 Duodenal ulcer, unspecified as acute or chronic, without hemorrhage or perforation: Secondary | ICD-10-CM

## 2012-08-10 DIAGNOSIS — K264 Chronic or unspecified duodenal ulcer with hemorrhage: Secondary | ICD-10-CM | POA: Diagnosis present

## 2012-08-10 DIAGNOSIS — K260 Acute duodenal ulcer with hemorrhage: Secondary | ICD-10-CM

## 2012-08-10 DIAGNOSIS — F1010 Alcohol abuse, uncomplicated: Secondary | ICD-10-CM | POA: Diagnosis present

## 2012-08-10 DIAGNOSIS — K920 Hematemesis: Secondary | ICD-10-CM | POA: Insufficient documentation

## 2012-08-10 DIAGNOSIS — I10 Essential (primary) hypertension: Secondary | ICD-10-CM | POA: Diagnosis present

## 2012-08-10 DIAGNOSIS — D500 Iron deficiency anemia secondary to blood loss (chronic): Secondary | ICD-10-CM | POA: Diagnosis present

## 2012-08-10 DIAGNOSIS — G9340 Encephalopathy, unspecified: Secondary | ICD-10-CM | POA: Diagnosis not present

## 2012-08-10 DIAGNOSIS — Z9889 Other specified postprocedural states: Secondary | ICD-10-CM

## 2012-08-10 HISTORY — PX: ESOPHAGOGASTRODUODENOSCOPY: SHX5428

## 2012-08-10 HISTORY — PX: LAPAROTOMY: SHX154

## 2012-08-10 LAB — BASIC METABOLIC PANEL
BUN: 23 mg/dL (ref 6–23)
CO2: 24 mEq/L (ref 19–32)
Calcium: 7.6 mg/dL — ABNORMAL LOW (ref 8.4–10.5)
Creatinine, Ser: 0.83 mg/dL (ref 0.50–1.35)
Glucose, Bld: 154 mg/dL — ABNORMAL HIGH (ref 70–99)

## 2012-08-10 LAB — CBC WITH DIFFERENTIAL/PLATELET
Basophils Relative: 0 % (ref 0–1)
HCT: 30.7 % — ABNORMAL LOW (ref 39.0–52.0)
Lymphs Abs: 0.8 10*3/uL (ref 0.7–4.0)
MCH: 29.3 pg (ref 26.0–34.0)
MCHC: 35.2 g/dL (ref 30.0–36.0)
Monocytes Absolute: 0.7 10*3/uL (ref 0.1–1.0)
Monocytes Relative: 5 % (ref 3–12)
Neutro Abs: 12.8 10*3/uL — ABNORMAL HIGH (ref 1.7–7.7)
WBC: 14.3 10*3/uL — ABNORMAL HIGH (ref 4.0–10.5)

## 2012-08-10 LAB — POCT I-STAT 7, (LYTES, BLD GAS, ICA,H+H)
Bicarbonate: 24.5 mEq/L — ABNORMAL HIGH (ref 20.0–24.0)
HCT: 21 % — ABNORMAL LOW (ref 39.0–52.0)
HCT: 25 % — ABNORMAL LOW (ref 39.0–52.0)
Hemoglobin: 7.1 g/dL — ABNORMAL LOW (ref 13.0–17.0)
Hemoglobin: 8.5 g/dL — ABNORMAL LOW (ref 13.0–17.0)
Patient temperature: 35.6
Patient temperature: 35.3
Sodium: 142 mEq/L (ref 135–145)
TCO2: 26 mmol/L (ref 0–100)
pCO2 arterial: 54.5 mmHg — ABNORMAL HIGH (ref 35.0–45.0)
pCO2 arterial: 44.8 mmHg (ref 35.0–45.0)
pH, Arterial: 7.254 — ABNORMAL LOW (ref 7.350–7.450)
pH, Arterial: 7.371 (ref 7.350–7.450)
pO2, Arterial: 399 mmHg — ABNORMAL HIGH (ref 80.0–100.0)
pO2, Arterial: 451 mmHg — ABNORMAL HIGH (ref 80.0–100.0)

## 2012-08-10 LAB — CBC
Hemoglobin: 7.1 g/dL — ABNORMAL LOW (ref 13.0–17.0)
MCH: 27.5 pg (ref 26.0–34.0)
MCV: 85.2 fL (ref 78.0–100.0)
Platelets: 87 10*3/uL — ABNORMAL LOW (ref 150–400)
Platelets: 106 10*3/uL — ABNORMAL LOW (ref 150–400)
RBC: 2.58 MIL/uL — ABNORMAL LOW (ref 4.22–5.81)
RDW: 15.3 % (ref 11.5–15.5)
WBC: 13.5 10*3/uL — ABNORMAL HIGH (ref 4.0–10.5)
WBC: 19.5 10*3/uL — ABNORMAL HIGH (ref 4.0–10.5)

## 2012-08-10 LAB — PROTIME-INR
INR: 1.37 (ref 0.00–1.49)
INR: 1.19 (ref 0.00–1.49)
Prothrombin Time: 16.5 seconds — ABNORMAL HIGH (ref 11.6–15.2)

## 2012-08-10 LAB — APTT: aPTT: 31 seconds (ref 24–37)

## 2012-08-10 LAB — POCT I-STAT 3, ART BLOOD GAS (G3+)
Bicarbonate: 24.4 mEq/L — ABNORMAL HIGH (ref 20.0–24.0)
TCO2: 26 mmol/L (ref 0–100)
pCO2 arterial: 42.1 mmHg (ref 35.0–45.0)
pH, Arterial: 7.372 (ref 7.350–7.450)
pO2, Arterial: 213 mmHg — ABNORMAL HIGH (ref 80.0–100.0)

## 2012-08-10 LAB — GLUCOSE, CAPILLARY: Glucose-Capillary: 124 mg/dL — ABNORMAL HIGH (ref 70–99)

## 2012-08-10 LAB — TROPONIN I: Troponin I: <0.3 ng/mL (ref <0.30)

## 2012-08-10 SURGERY — EGD (ESOPHAGOGASTRODUODENOSCOPY)
Anesthesia: Moderate Sedation

## 2012-08-10 SURGERY — LAPAROTOMY, EXPLORATORY
Anesthesia: General | Site: Abdomen | Wound class: Contaminated

## 2012-08-10 MED ORDER — ROCURONIUM BROMIDE 100 MG/10ML IV SOLN
INTRAVENOUS | Status: DC | PRN
  Administered 2012-08-10 (×3): 50 mg via INTRAVENOUS

## 2012-08-10 MED ORDER — HYDRALAZINE HCL 20 MG/ML IJ SOLN
10.0000 mg | INTRAMUSCULAR | Status: DC | PRN
  Administered 2012-08-11: 20 mg via INTRAVENOUS
  Filled 2012-08-10: qty 2

## 2012-08-10 MED ORDER — SODIUM CHLORIDE 0.9 % IV SOLN: INTRAVENOUS | Status: DC

## 2012-08-10 MED ORDER — BIOTENE DRY MOUTH MT LIQD
15.0000 mL | Freq: Four times a day (QID) | OROMUCOSAL | Status: DC
  Administered 2012-08-11 – 2012-08-12 (×6): 15 mL via OROMUCOSAL

## 2012-08-10 MED ORDER — CHLORHEXIDINE GLUCONATE 0.12 % MT SOLN
15.0000 mL | Freq: Two times a day (BID) | OROMUCOSAL | Status: DC
  Administered 2012-08-10 – 2012-08-12 (×4): 15 mL via OROMUCOSAL
  Filled 2012-08-10 (×4): qty 15

## 2012-08-10 MED ORDER — CEFAZOLIN SODIUM-DEXTROSE 2-3 GM-% IV SOLR
INTRAVENOUS | Status: DC | PRN
  Administered 2012-08-10: 2 g via INTRAVENOUS

## 2012-08-10 MED ORDER — PROPOFOL 10 MG/ML IV EMUL
INTRAVENOUS | Status: AC
  Administered 2012-08-10: 5 ug/kg/min via INTRAVENOUS
  Filled 2012-08-10: qty 100

## 2012-08-10 MED ORDER — MIDAZOLAM HCL 10 MG/2ML IJ SOLN
INTRAMUSCULAR | Status: DC | PRN
  Administered 2012-08-10: 1 mg via INTRAVENOUS
  Administered 2012-08-10 (×2): 2 mg via INTRAVENOUS

## 2012-08-10 MED ORDER — VECURONIUM BROMIDE 10 MG IV SOLR
INTRAVENOUS | Status: DC | PRN
  Administered 2012-08-10: 5 mg via INTRAVENOUS
  Administered 2012-08-10: 10 mg via INTRAVENOUS

## 2012-08-10 MED ORDER — FENTANYL CITRATE 0.05 MG/ML IJ SOLN
INTRAMUSCULAR | Status: DC | PRN
  Administered 2012-08-10 (×3): 25 ug via INTRAVENOUS

## 2012-08-10 MED ORDER — BUTAMBEN-TETRACAINE-BENZOCAINE 2-2-14 % EX AERO
INHALATION_SPRAY | CUTANEOUS | Status: DC | PRN
  Administered 2012-08-10: 2 via TOPICAL

## 2012-08-10 MED ORDER — MIDAZOLAM HCL 10 MG/2ML IJ SOLN
2.5000 mg | Freq: Once | INTRAMUSCULAR | Status: AC
  Administered 2012-08-10: 2.5 mg via INTRAVENOUS

## 2012-08-10 MED ORDER — METOPROLOL TARTRATE 1 MG/ML IV SOLN: 10.0000 mg | INTRAVENOUS | Status: DC | PRN

## 2012-08-10 MED ORDER — ALBUTEROL SULFATE HFA 108 (90 BASE) MCG/ACT IN AERS
8.0000 | INHALATION_SPRAY | Freq: Four times a day (QID) | RESPIRATORY_TRACT | Status: DC
  Administered 2012-08-10 – 2012-08-11 (×3): 8 via RESPIRATORY_TRACT
  Filled 2012-08-10: qty 6.7

## 2012-08-10 MED ORDER — IPRATROPIUM BROMIDE HFA 17 MCG/ACT IN AERS
8.0000 | INHALATION_SPRAY | Freq: Four times a day (QID) | RESPIRATORY_TRACT | Status: DC
  Administered 2012-08-10 – 2012-08-11 (×3): 8 via RESPIRATORY_TRACT
  Filled 2012-08-10: qty 12.9

## 2012-08-10 MED ORDER — MIDAZOLAM HCL 5 MG/5ML IJ SOLN
INTRAMUSCULAR | Status: DC | PRN
  Administered 2012-08-10 (×2): 2 mg via INTRAVENOUS

## 2012-08-10 MED ORDER — POVIDONE-IODINE 10 % EX OINT
TOPICAL_OINTMENT | CUTANEOUS | Status: AC
  Filled 2012-08-10: qty 28.35

## 2012-08-10 MED ORDER — FENTANYL CITRATE 0.05 MG/ML IJ SOLN
INTRAMUSCULAR | Status: DC | PRN
  Administered 2012-08-10 (×2): 50 ug via INTRAVENOUS
  Administered 2012-08-10: 100 ug via INTRAVENOUS
  Administered 2012-08-10: 50 ug via INTRAVENOUS

## 2012-08-10 MED ORDER — DIPHENHYDRAMINE HCL 50 MG/ML IJ SOLN
INTRAMUSCULAR | Status: DC | PRN
  Administered 2012-08-10: 25 mg via INTRAVENOUS

## 2012-08-10 MED ORDER — METHYLPREDNISOLONE SODIUM SUCC 125 MG IJ SOLR
80.0000 mg | Freq: Once | INTRAMUSCULAR | Status: AC
  Administered 2012-08-10: 80 mg via INTRAVENOUS
  Filled 2012-08-10: qty 1.28

## 2012-08-10 MED ORDER — FENTANYL CITRATE 0.05 MG/ML IJ SOLN
25.0000 ug | Freq: Once | INTRAMUSCULAR | Status: AC
  Administered 2012-08-10: 25 ug via INTRAVENOUS

## 2012-08-10 MED ORDER — DEXTROSE 5 % IV SOLN
10.0000 mg | INTRAVENOUS | Status: DC | PRN
  Administered 2012-08-10: 50 ug/min via INTRAVENOUS

## 2012-08-10 MED ORDER — DIPHENHYDRAMINE HCL 50 MG/ML IJ SOLN
INTRAMUSCULAR | Status: AC
  Filled 2012-08-10: qty 1

## 2012-08-10 MED ORDER — MIDAZOLAM HCL 5 MG/ML IJ SOLN
INTRAMUSCULAR | Status: AC
  Filled 2012-08-10: qty 2

## 2012-08-10 MED ORDER — METOPROLOL TARTRATE 1 MG/ML IV SOLN
INTRAVENOUS | Status: AC
  Administered 2012-08-10: 10 mg
  Filled 2012-08-10: qty 10

## 2012-08-10 MED ORDER — SODIUM BICARBONATE 4.2 % IV SOLN
INTRAVENOUS | Status: DC | PRN
  Administered 2012-08-10: 50 mL via INTRAVENOUS

## 2012-08-10 MED ORDER — 0.9 % SODIUM CHLORIDE (POUR BTL) OPTIME
TOPICAL | Status: DC | PRN
  Administered 2012-08-10 (×3): 1000 mL

## 2012-08-10 MED ORDER — LACTATED RINGERS IV SOLN
INTRAVENOUS | Status: DC | PRN
  Administered 2012-08-10 (×2): via INTRAVENOUS

## 2012-08-10 MED ORDER — CEFAZOLIN SODIUM 1-5 GM-% IV SOLN
INTRAVENOUS | Status: AC
  Filled 2012-08-10: qty 100

## 2012-08-10 MED ORDER — FENTANYL BOLUS VIA INFUSION
25.0000 ug | Freq: Four times a day (QID) | INTRAVENOUS | Status: DC | PRN
  Filled 2012-08-10: qty 100

## 2012-08-10 MED ORDER — SODIUM CHLORIDE 0.9 % IV SOLN
80.0000 mg | INTRAVENOUS | Status: DC | PRN
  Administered 2012-08-10: 8 mg/h via INTRAVENOUS

## 2012-08-10 MED ORDER — FENTANYL CITRATE 0.05 MG/ML IJ SOLN
INTRAMUSCULAR | Status: AC
  Filled 2012-08-10: qty 2

## 2012-08-10 MED ORDER — PROPOFOL 10 MG/ML IV EMUL
5.0000 ug/kg/min | INTRAVENOUS | Status: DC
  Administered 2012-08-10: 5 ug/kg/min via INTRAVENOUS
  Administered 2012-08-11: 15 ug/kg/min via INTRAVENOUS
  Filled 2012-08-10: qty 100

## 2012-08-10 MED ORDER — EPINEPHRINE HCL 0.1 MG/ML IJ SOLN
INTRAMUSCULAR | Status: AC
  Filled 2012-08-10: qty 10

## 2012-08-10 MED ORDER — ALBUTEROL SULFATE HFA 108 (90 BASE) MCG/ACT IN AERS: 8.0000 | INHALATION_SPRAY | RESPIRATORY_TRACT | Status: DC | PRN

## 2012-08-10 MED ORDER — ARTIFICIAL TEARS OP OINT
TOPICAL_OINTMENT | OPHTHALMIC | Status: DC | PRN
  Administered 2012-08-10: 1 via OPHTHALMIC

## 2012-08-10 MED ORDER — SODIUM CHLORIDE 0.9 % IV SOLN
25.0000 ug/h | INTRAVENOUS | Status: DC
  Administered 2012-08-10: 50 ug/h via INTRAVENOUS
  Administered 2012-08-11: 100 ug/h via INTRAVENOUS
  Filled 2012-08-10 (×2): qty 50

## 2012-08-10 SURGICAL SUPPLY — 58 items
BLADE SURG ROTATE 9660 (MISCELLANEOUS) ×2 IMPLANT
CANISTER SUCTION 2500CC (MISCELLANEOUS) ×4 IMPLANT
CHLORAPREP W/TINT 26ML (MISCELLANEOUS) ×2 IMPLANT
CLOTH BEACON ORANGE TIMEOUT ST (SAFETY) ×2 IMPLANT
COVER MAYO STAND STRL (DRAPES) IMPLANT
COVER SURGICAL LIGHT HANDLE (MISCELLANEOUS) ×2 IMPLANT
DRAIN CHANNEL 19F RND (DRAIN) ×2 IMPLANT
DRAPE LAPAROSCOPIC ABDOMINAL (DRAPES) ×2 IMPLANT
DRAPE UTILITY 15X26 W/TAPE STR (DRAPE) ×4 IMPLANT
DRAPE WARM FLUID 44X44 (DRAPE) ×2 IMPLANT
DRSG MEPILEX BORDER 4X12 (GAUZE/BANDAGES/DRESSINGS) ×2 IMPLANT
ELECT BLADE 6.5 EXT (BLADE) ×2 IMPLANT
ELECT CAUTERY BLADE 6.4 (BLADE) ×2 IMPLANT
ELECT REM PT RETURN 9FT ADLT (ELECTROSURGICAL) ×2
ELECTRODE REM PT RTRN 9FT ADLT (ELECTROSURGICAL) ×1 IMPLANT
EVACUATOR SILICONE 100CC (DRAIN) ×2 IMPLANT
GLOVE BIO SURGEON STRL SZ7 (GLOVE) ×2 IMPLANT
GLOVE BIO SURGEON STRL SZ7.5 (GLOVE) ×4 IMPLANT
GLOVE BIO SURGEON STRL SZ8 (GLOVE) ×4 IMPLANT
GLOVE BIOGEL PI IND STRL 6.5 (GLOVE) ×1 IMPLANT
GLOVE BIOGEL PI IND STRL 7.0 (GLOVE) ×1 IMPLANT
GLOVE BIOGEL PI IND STRL 8 (GLOVE) ×3 IMPLANT
GLOVE BIOGEL PI INDICATOR 6.5 (GLOVE) ×1
GLOVE BIOGEL PI INDICATOR 7.0 (GLOVE) ×1
GLOVE BIOGEL PI INDICATOR 8 (GLOVE) ×3
GLOVE ECLIPSE 6.5 STRL STRAW (GLOVE) ×2 IMPLANT
GLOVE ECLIPSE 7.0 STRL STRAW (GLOVE) ×4 IMPLANT
GLOVE ECLIPSE 7.5 STRL STRAW (GLOVE) ×12 IMPLANT
GLOVE ORTHO TXT STRL SZ7.5 (GLOVE) ×2 IMPLANT
GOWN BRE IMP SLV AUR XL STRL (GOWN DISPOSABLE) ×2 IMPLANT
GOWN SRG XL XLNG 56XLVL 4 (GOWN DISPOSABLE) ×1 IMPLANT
GOWN STRL NON-REIN LRG LVL3 (GOWN DISPOSABLE) ×6 IMPLANT
GOWN STRL NON-REIN XL XLG LVL4 (GOWN DISPOSABLE) ×2
KIT BASIN OR (CUSTOM PROCEDURE TRAY) ×2 IMPLANT
KIT ROOM TURNOVER OR (KITS) ×2 IMPLANT
LIGASURE IMPACT 36 18CM CVD LR (INSTRUMENTS) IMPLANT
NS IRRIG 1000ML POUR BTL (IV SOLUTION) ×4 IMPLANT
PACK GENERAL/GYN (CUSTOM PROCEDURE TRAY) ×2 IMPLANT
PAD ARMBOARD 7.5X6 YLW CONV (MISCELLANEOUS) ×2 IMPLANT
PAD SHARPS MAGNETIC DISPOSAL (MISCELLANEOUS) IMPLANT
RETAINER VISCERA MED (MISCELLANEOUS) ×2 IMPLANT
SPECIMEN JAR X LARGE (MISCELLANEOUS) IMPLANT
SPONGE LAP 18X18 X RAY DECT (DISPOSABLE) IMPLANT
STAPLER VISISTAT 35W (STAPLE) ×4 IMPLANT
SUCTION POOLE TIP (SUCTIONS) ×2 IMPLANT
SUT ETHILON 3 0 FSL (SUTURE) ×2 IMPLANT
SUT PDS AB 1 TP1 96 (SUTURE) ×4 IMPLANT
SUT SILK 2 0 SH CR/8 (SUTURE) ×4 IMPLANT
SUT SILK 2 0 TIES 10X30 (SUTURE) ×2 IMPLANT
SUT SILK 2 0SH CR/8 30 (SUTURE) ×8 IMPLANT
SUT SILK 3 0 SH CR/8 (SUTURE) ×2 IMPLANT
SUT SILK 3 0 TIES 10X30 (SUTURE) ×2 IMPLANT
SYR BULB IRRIGATION 50ML (SYRINGE) ×2 IMPLANT
SYRINGE IRR TOOMEY STRL 70CC (SYRINGE) ×2 IMPLANT
TOWEL OR 17X26 10 PK STRL BLUE (TOWEL DISPOSABLE) ×2 IMPLANT
TRAY FOLEY CATH 14FRSI W/METER (CATHETERS) ×2 IMPLANT
WATER STERILE IRR 1000ML POUR (IV SOLUTION) IMPLANT
YANKAUER SUCT BULB TIP NO VENT (SUCTIONS) ×2 IMPLANT

## 2012-08-10 NOTE — Interval H&P Note (Signed)
History and Physical Interval Note:  08/10/2012 9:16 AM  [name]  has presented today for surgery, with the diagnosis of gi bleeding  The various methods of treatment have been discussed with the patient and family. After consideration of risks, benefits and other options for treatment, the patient has consented to  Procedure(s) (LRB) with comments: ESOPHAGOGASTRODUODENOSCOPY (EGD) (N/A) - will be done in room 1  as a surgical intervention .  The patient's history has been reviewed, patient examined, no change in status, stable for surgery.  I have reviewed the patient's chart and labs.  Questions were answered to the patient's satisfaction.     Rob Bunting

## 2012-08-10 NOTE — Procedures (Signed)
Intubation Procedure Note Melissa Pulido [id number] [birth year]  Procedure: Intubation Indications: Airway protection and maintenance  Procedure Details Consent: Unable to obtain consent because of emergent medical necessity. Time Out: Verified patient identification, verified procedure, site/side was marked, verified correct patient position, special equipment/implants available, medications/allergies/relevent history reviewed, required imaging and test results available.  Performed  Versed, fentanyl  Etomidate 40 mg Rocuronium 40 mg #4 MAC blade Cords deep and anterior but well visualized Blood suctioned from airway 7.5 ETT placed on first attempt Position confirmed by auscultation and EZCap    Evaluation Hemodynamic Status: BP stable throughout; O2 sats: stable throughout Patient's Current Condition: stable Complications: No apparent complications Patient did tolerate procedure well. Chest X-ray ordered to verify placement.  CXR: tube position acceptable.   Billy Fischer 08/10/2012

## 2012-08-10 NOTE — Consult Note (Signed)
[name] 07/11/1961  [id number].   Requesting MD: Dr. Rob Bunting Chief Complaint/Reason for Consult: UGI bleed from duodenal ulcer HPI: This is a 52 yo male who is currently in endoscopy and sedated.  No information is obtained from him.  It appears from the H&P that the patient has been having some abdominal discomfort and upset.  He has had some emesis, but yesterday awoke with hematemesis.  His hgb was 5 on arrival.  He was given 3 units of pRBCs yesterday and hgb came up to 7.  He was taken to endo today where he was found to have an active bleeding duodenal artery from a duodenal ulcer.  We were called to see the patient given this was unable to be controlled endoscopically.  Review of Systems: unable to obtained.  History reviewed. No pertinent family history.  Past Medical History  Diagnosis Date  . Abdominal  pain, other specified site   . GERD (gastroesophageal reflux disease)   . Abnormal CT scan, esophagus 03/2012  . Substance abuse 03/2012    tox screen positive for cocaine.     Past Surgical History  Procedure Date  . Undescended testicle     on right. noted on 03/2012 CT scan.  no surgery referral.     Social History:  reports that he has been smoking.  He does not have any smokeless tobacco history on file. He reports that he drinks alcohol. He reports that he uses illicit drugs (Marijuana).  Allergies: No Known Allergies  No prescriptions prior to admission    Blood pressure 155/97, pulse 82, temperature 97.5 F (36.4 C), temperature source Oral, resp. rate 16, height 5\' 9"  (1.753 m), weight 163 lb 5.8 oz (74.1 kg), SpO2 100.00%. Physical Exam: General: sedated thin black male HEENT: head is normocephalic, atraumatic.  Sclera are noninjected.  PERRL.  Ears and nose without any masses or lesions.  Mouth is pink and moist Heart: regular, rate, and rhythm.  Normal s1,s2. No obvious murmurs, gallops, or rubs noted.  Palpable radial and pedal pulses  bilaterally Lungs: diffuse rhonchi, coarse breath sounds, no respiratory distress Abd: soft, NT, ND, +BS, no masses, hernias, or organomegaly MS: all 4 extremities are symmetrical with no cyanosis, clubbing, or edema. Skin: warm and dry with no masses, lesions, or rashes, some diaphoresis Psych: sedated, unable to assess    Results for orders placed during the hospital encounter of 08/09/12 (from the past 48 hour(s))  CBC WITH DIFFERENTIAL     Status: Abnormal   Collection Time   08/09/12  1:56 PM      Component Value Range Comment   WBC 13.0 (*) 4.0 - 10.5 K/uL    RBC 2.27 (*) 4.22 - 5.81 MIL/uL    Hemoglobin 5.9 (*) 13.0 - 17.0 g/dL    HCT 40.9 (*) 81.1 - 52.0 %    MCV 78.4  78.0 - 100.0 fL    MCH 26.0  26.0 - 34.0 pg    MCHC 33.1  30.0 - 36.0 g/dL    RDW 91.4 (*) 78.2 - 15.5 %    Platelets 126 (*) 150 - 400 K/uL    Neutrophils Relative 79 (*) 43 - 77 %    Neutro Abs 10.2 (*) 1.7 - 7.7 K/uL    Lymphocytes Relative 15  12 - 46 %    Lymphs Abs 1.9  0.7 - 4.0 K/uL    Monocytes Relative 6  3 - 12 %    Monocytes Absolute 0.8  0.1 - 1.0 K/uL    Eosinophils Relative 1  0 - 5 %    Eosinophils Absolute 0.1  0.0 - 0.7 K/uL    Basophils Relative 0  0 - 1 %    Basophils Absolute 0.0  0.0 - 0.1 K/uL   BASIC METABOLIC PANEL     Status: Abnormal   Collection Time   08/09/12  1:56 PM      Component Value Range Comment   Sodium 141  135 - 145 mEq/L    Potassium 3.4 (*) 3.5 - 5.1 mEq/L    Chloride 103  96 - 112 mEq/L    CO2 28  19 - 32 mEq/L    Glucose, Bld 133 (*) 70 - 99 mg/dL    BUN 31 (*) 6 - 23 mg/dL    Creatinine, Ser 4.09  0.50 - 1.35 mg/dL    Calcium 7.7 (*) 8.4 - 10.5 mg/dL    GFR calc non Af Amer 60 (*) >90 mL/min    GFR calc Af Amer 70 (*) >90 mL/min   HEPATIC FUNCTION PANEL     Status: Abnormal   Collection Time   08/09/12  1:56 PM      Component Value Range Comment   Total Protein 4.3 (*) 6.0 - 8.3 g/dL    Albumin 2.1 (*) 3.5 - 5.2 g/dL    AST 13  0 - 37 U/L    ALT 7  0 -  53 U/L    Alkaline Phosphatase 36 (*) 39 - 117 U/L    Total Bilirubin 0.1 (*) 0.3 - 1.2 mg/dL    Bilirubin, Direct <8.1  0.0 - 0.3 mg/dL    Indirect Bilirubin NOT CALCULATED  0.3 - 0.9 mg/dL   ETHANOL     Status: Normal   Collection Time   08/09/12  1:56 PM      Component Value Range Comment   Alcohol, Ethyl (B) <11  0 - 11 mg/dL   LIPASE, BLOOD     Status: Normal   Collection Time   08/09/12  1:56 PM      Component Value Range Comment   Lipase 31  11 - 59 U/L   TROPONIN I     Status: Normal   Collection Time   08/09/12  2:02 PM      Component Value Range Comment   Troponin I <0.30  <0.30 ng/mL   POCT I-STAT TROPONIN I     Status: Normal   Collection Time   08/09/12  2:19 PM      Component Value Range Comment   Troponin i, poc 0.01  0.00 - 0.08 ng/mL    Comment 3            PREPARE RBC (CROSSMATCH)     Status: Normal   Collection Time   08/09/12  3:13 PM      Component Value Range Comment   Order Confirmation ORDER PROCESSED BY BLOOD BANK     TYPE AND SCREEN     Status: Normal (Preliminary result)   Collection Time   08/09/12  3:13 PM      Component Value Range Comment   ABO/RH(D) O NEG      Antibody Screen NEG      Sample Expiration 08/12/2012      Unit Number [id number]      Blood Component Type RBC LR PHER1      Unit division 00      Status of Unit ISSUED,FINAL  Unit tag comment VERBAL ORDERS PER DR PLUNKETT      Transfusion Status OK TO TRANSFUSE      Crossmatch Result COMPATIBLE      Unit Number [id number]      Blood Component Type RED CELLS,LR      Unit division 00      Status of Unit ISSUED,FINAL      Transfusion Status OK TO TRANSFUSE      Crossmatch Result Compatible      Unit Number [id number]      Blood Component Type RBC LR PHER1      Unit division 00      Status of Unit ISSUED,FINAL      Transfusion Status OK TO TRANSFUSE      Crossmatch Result Compatible      Unit Number [id number]      Blood Component Type RBC LR PHER2      Unit division  00      Status of Unit REL FROM [facility]      Transfusion Status OK TO TRANSFUSE      Crossmatch Result Compatible      Unit Number [id number]      Blood Component Type RBC LR PHER1      Unit division 00      Status of Unit REL FROM [facility]      Transfusion Status OK TO TRANSFUSE      Crossmatch Result Compatible      Unit Number [id number]      Blood Component Type RED CELLS,LR      Unit division 00      Status of Unit ISSUED      Transfusion Status OK TO TRANSFUSE      Crossmatch Result COMPATIBLE      Unit tag comment VERBAL ORDERS PER DR DR. PATRICK      Unit Number [id number]      Blood Component Type RED CELLS,LR      Unit division 00      Status of Unit ISSUED      Transfusion Status OK TO TRANSFUSE      Crossmatch Result COMPATIBLE      Unit tag comment VERBAL ORDERS PER DR PATRICK      Unit Number [id number]      Blood Component Type RBC LR PHER2      Unit division 00      Status of Unit ISSUED      Unit tag comment VERBAL ORDERS PER DR DR Luisa Hart      Transfusion Status OK TO TRANSFUSE      Crossmatch Result COMPATIBLE      Unit Number [id number]      Blood Component Type RED CELLS,LR      Unit division 00      Status of Unit ISSUED      Unit tag comment VERBAL ORDERS PER DR DR. PATRICK      Transfusion Status OK TO TRANSFUSE      Crossmatch Result COMPATIBLE      Unit Number [id number]      Blood Component Type RBC LR PHER1      Unit division 00      Status of Unit ISSUED      Unit tag comment VERBAL ORDERS PER DR DR. PATRICK      Transfusion Status OK TO TRANSFUSE      Crossmatch Result COMPATIBLE      Unit Number [id number]      Blood Component Type RED CELLS,LR  Unit division 00      Status of Unit ISSUED      Unit tag comment VERBAL ORDERS PER DR DR. PATRICK      Transfusion Status OK TO TRANSFUSE      Crossmatch Result COMPATIBLE      Unit Number [id number]      Blood Component Type RBC LR PHER2      Unit division 00       Status of Unit ISSUED      Unit tag comment VERBAL ORDERS PER DR DR. PATRICK      Transfusion Status OK TO TRANSFUSE      Crossmatch Result COMPATIBLE      Unit Number [id number]      Blood Component Type RBC LR PHER1      Unit division 00      Status of Unit ISSUED      Unit tag comment VERBAL ORDERS PER DR DR Luisa Hart      Transfusion Status OK TO TRANSFUSE      Crossmatch Result COMPATIBLE      Unit Number [id number]      Blood Component Type RBC LR PHER2      Unit division 00      Status of Unit REL FROM [facility]      Unit tag comment VERBAL ORDERS PER DR DR Luisa Hart      Transfusion Status OK TO TRANSFUSE      Crossmatch Result COMPATIBLE      Unit Number [id number]      Blood Component Type RBC LR PHER2      Unit division 00      Status of Unit REL FROM [facility]      Unit tag comment VERBAL ORDERS PER DR DR Luisa Hart      Transfusion Status OK TO TRANSFUSE      Crossmatch Result COMPATIBLE      Unit Number [id number]      Blood Component Type RBC LR PHER1      Unit division 00      Status of Unit REL FROM [facility]      Unit tag comment VERBAL ORDERS PER DR DR Luisa Hart      Transfusion Status OK TO TRANSFUSE      Crossmatch Result COMPATIBLE      Unit Number [id number]      Blood Component Type RBC LR PHER1      Unit division 00      Status of Unit REL FROM [facility]      Unit tag comment VERBAL ORDERS PER DR DR Luisa Hart      Transfusion Status OK TO TRANSFUSE      Crossmatch Result COMPATIBLE      Unit Number [id number]      Blood Component Type RBC LR PHER2      Unit division 00      Status of Unit REL FROM [facility]      Unit tag comment VERBAL ORDERS PER DR DR Luisa Hart      Transfusion Status OK TO TRANSFUSE      Crossmatch Result COMPATIBLE      Unit Number [id number]      Blood Component Type RED CELLS,LR      Unit division 00      Status of Unit REL FROM [facility]      Unit tag comment VERBAL ORDERS PER DR DR Luisa Hart      Transfusion Status OK TO TRANSFUSE       Crossmatch Result  COMPATIBLE      Unit Number [id number]      Blood Component Type RED CELLS,LR      Unit division 00      Status of Unit REL FROM [facility]      Unit tag comment VERBAL ORDERS PER DR DR Luisa Hart      Transfusion Status OK TO TRANSFUSE      Crossmatch Result COMPATIBLE      Unit Number [id number]      Blood Component Type RED CELLS,LR      Unit division 00      Status of Unit ALLOCATED      Unit tag comment VERBAL ORDERS PER DR PATRICK      Transfusion Status OK TO TRANSFUSE      Crossmatch Result COMPATIBLE      Unit Number [id number]      Blood Component Type RED CELLS,LR      Unit division 00      Status of Unit ALLOCATED      Unit tag comment VERBAL ORDERS PER DR PATRICK      Transfusion Status OK TO TRANSFUSE      Crossmatch Result COMPATIBLE      Unit Number [id number]      Blood Component Type RED CELLS,LR      Unit division 00      Status of Unit ALLOCATED      Unit tag comment VERBAL ORDERS PER DR DR Luisa Hart      Transfusion Status OK TO TRANSFUSE      Crossmatch Result COMPATIBLE      Unit Number [id number]      Blood Component Type RED CELLS,LR      Unit division 00      Status of Unit ALLOCATED      Unit tag comment VERBAL ORDERS PER DR DR Luisa Hart      Transfusion Status OK TO TRANSFUSE      Crossmatch Result COMPATIBLE      Unit Number [id number]      Blood Component Type RED CELLS,LR      Unit division 00      Status of Unit ALLOCATED      Unit tag comment VERBAL ORDERS PER DR DR Luisa Hart      Transfusion Status OK TO TRANSFUSE      Crossmatch Result COMPATIBLE      Unit Number [id number]      Blood Component Type RED CELLS,LR      Unit division 00      Status of Unit ALLOCATED      Unit tag comment VERBAL ORDERS PER DR DR Luisa Hart      Transfusion Status OK TO TRANSFUSE      Crossmatch Result COMPATIBLE     ABO/RH     Status: Normal   Collection Time   08/09/12  3:13 PM      Component Value Range Comment   ABO/RH(D) O  NEG     PREPARE FRESH FROZEN PLASMA     Status: Normal (Preliminary result)   Collection Time   08/09/12  4:00 PM      Component Value Range Comment   Unit Number [id number]      Blood Component Type THAWED PLASMA      Unit division 00      Status of Unit ISSUED,FINAL      Transfusion Status OK TO TRANSFUSE      Unit Number [id number]      Blood Component Type THAWED  PLASMA      Unit division 00      Status of Unit ISSUED      Transfusion Status OK TO TRANSFUSE      Unit Number [id number]      Blood Component Type THAWED PLASMA      Unit division 00      Status of Unit ISSUED      Transfusion Status OK TO TRANSFUSE      Unit Number [id number]      Blood Component Type THAWED PLASMA      Unit division 00      Status of Unit ISSUED      Transfusion Status OK TO TRANSFUSE      Unit Number [id number]      Blood Component Type THAWED PLASMA      Unit division 00      Status of Unit ISSUED      Transfusion Status OK TO TRANSFUSE     PROTIME-INR     Status: Abnormal   Collection Time   08/09/12  4:34 PM      Component Value Range Comment   Prothrombin Time 16.4 (*) 11.6 - 15.2 seconds    INR 1.35  0.00 - 1.49   URINALYSIS, ROUTINE W REFLEX MICROSCOPIC     Status: Abnormal   Collection Time   08/09/12  6:24 PM      Component Value Range Comment   Color, Urine YELLOW  YELLOW    APPearance CLEAR  CLEAR    Specific Gravity, Urine 1.023  1.005 - 1.030    pH 5.5  5.0 - 8.0    Glucose, UA NEGATIVE  NEGATIVE mg/dL    Hgb urine dipstick NEGATIVE  NEGATIVE    Bilirubin Urine NEGATIVE  NEGATIVE    Ketones, ur NEGATIVE  NEGATIVE mg/dL    Protein, ur NEGATIVE  NEGATIVE mg/dL    Urobilinogen, UA 1.0  0.0 - 1.0 mg/dL    Nitrite NEGATIVE  NEGATIVE    Leukocytes, UA LARGE (*) NEGATIVE   URINE RAPID DRUG SCREEN (HOSP PERFORMED)     Status: Abnormal   Collection Time   08/09/12  6:24 PM      Component Value Range Comment   Opiates POSITIVE (*) NONE DETECTED    Cocaine NONE  DETECTED  NONE DETECTED    Benzodiazepines NONE DETECTED  NONE DETECTED    Amphetamines NONE DETECTED  NONE DETECTED    Tetrahydrocannabinol NONE DETECTED  NONE DETECTED    Barbiturates NONE DETECTED  NONE DETECTED   URINE MICROSCOPIC-ADD ON     Status: Abnormal   Collection Time   08/09/12  6:24 PM      Component Value Range Comment   Squamous Epithelial / LPF FEW (*) RARE    WBC, UA 7-10  <3 WBC/hpf    Bacteria, UA RARE  RARE   MRSA PCR SCREENING     Status: Normal   Collection Time   08/09/12  7:26 PM      Component Value Range Comment   MRSA by PCR NEGATIVE  NEGATIVE   CBC     Status: Abnormal   Collection Time   08/10/12  4:14 AM      Component Value Range Comment   WBC 13.5 (*) 4.0 - 10.5 K/uL    RBC 2.58 (*) 4.22 - 5.81 MIL/uL    Hemoglobin 7.1 (*) 13.0 - 17.0 g/dL    HCT 74.2 (*) 59.5 - 52.0 %    MCV 80.2  78.0 - 100.0 fL    MCH 27.5  26.0 - 34.0 pg    MCHC 34.3  30.0 - 36.0 g/dL    RDW 16.1 (*) 09.6 - 15.5 %    Platelets 87 (*) 150 - 400 K/uL   PREPARE FRESH FROZEN PLASMA     Status: Normal (Preliminary result)   Collection Time   08/10/12 11:04 AM      Component Value Range Comment   Unit Number [id number]      Blood Component Type THAWED PLASMA      Unit division 00      Status of Unit ALLOCATED      Transfusion Status OK TO TRANSFUSE      Unit Number [id number]      Blood Component Type THAWED PLASMA      Unit division 00      Status of Unit ALLOCATED      Transfusion Status OK TO TRANSFUSE      Unit Number [id number]      Blood Component Type THAWED PLASMA      Unit division 00      Status of Unit ISSUED      Transfusion Status OK TO TRANSFUSE      Unit Number [id number]      Blood Component Type THAWED PLASMA      Unit division 00      Status of Unit ISSUED      Transfusion Status OK TO TRANSFUSE      Unit Number [id number]      Blood Component Type THAWED PLASMA      Unit division 00      Status of Unit ALLOCATED      Transfusion  Status OK TO TRANSFUSE      Unit Number [id number]      Blood Component Type THAWED PLASMA      Unit division 00      Status of Unit ALLOCATED      Transfusion Status OK TO TRANSFUSE      Unit Number [id number]      Blood Component Type THAWED PLASMA      Unit division 00      Status of Unit ALLOCATED      Transfusion Status OK TO TRANSFUSE      Unit Number [id number]      Blood Component Type THAWED PLASMA      Unit division 00      Status of Unit ALLOCATED      Transfusion Status OK TO TRANSFUSE     PREPARE PLATELET PHERESIS     Status: Normal (Preliminary result)   Collection Time   08/10/12 11:08 AM      Component Value Range Comment   Unit Number [id number]      Blood Component Type PLTPHER LR1      Unit division 00      Status of Unit ISSUED      Transfusion Status OK TO TRANSFUSE      Unit Number [id number]      Blood Component Type PLTPHER LR2      Unit division 00      Status of Unit ISS'D ANOTH PT      Transfusion Status OK TO TRANSFUSE     TYPE AND SCREEN     Status: Normal   Collection Time   08/10/12 11:15 AM      Component Value Range Comment   ABO/RH(D) O NEG      Antibody Screen NEG  Sample Expiration 08/13/2012     POCT I-STAT 7, (LYTES, BLD GAS, ICA,H+H)     Status: Abnormal   Collection Time   08/10/12 11:49 AM      Component Value Range Comment   pH, Arterial 7.254 (*) 7.350 - 7.450    pCO2 arterial 54.5 (*) 35.0 - 45.0 mmHg    pO2, Arterial 399.0 (*) 80.0 - 100.0 mmHg    Bicarbonate 24.5 (*) 20.0 - 24.0 mEq/L    TCO2 26  0 - 100 mmol/L    O2 Saturation 100.0      Acid-base deficit 3.0 (*) 0.0 - 2.0 mmol/L    Sodium 145  135 - 145 mEq/L    Potassium 4.8  3.5 - 5.1 mEq/L    Calcium, Ion 0.72 (*) 1.12 - 1.23 mmol/L    HCT 21.0 (*) 39.0 - 52.0 %    Hemoglobin 7.1 (*) 13.0 - 17.0 g/dL    Patient temperature 35.6 C      Sample type ARTERIAL      Comment VALUES EXPECTED, NO REPEAT     APTT     Status: Normal   Collection Time    08/10/12 12:46 PM      Component Value Range Comment   aPTT 31  24 - 37 seconds   PROTIME-INR     Status: Abnormal   Collection Time   08/10/12 12:46 PM      Component Value Range Comment   Prothrombin Time 16.5 (*) 11.6 - 15.2 seconds    INR 1.37  0.00 - 1.49   CBC     Status: Abnormal   Collection Time   08/10/12 12:46 PM      Component Value Range Comment   WBC 19.5 (*) 4.0 - 10.5 K/uL    RBC 3.05 (*) 4.22 - 5.81 MIL/uL    Hemoglobin 8.9 (*) 13.0 - 17.0 g/dL DELTA CHECK NOTED   HCT 26.0 (*) 39.0 - 52.0 %    MCV 85.2  78.0 - 100.0 fL    MCH 29.2  26.0 - 34.0 pg    MCHC 34.2  30.0 - 36.0 g/dL    RDW 16.1  09.6 - 04.5 %    Platelets 106 (*) 150 - 400 K/uL CONSISTENT WITH PREVIOUS RESULT  POCT I-STAT 7, (LYTES, BLD GAS, ICA,H+H)     Status: Abnormal   Collection Time   08/10/12 12:53 PM      Component Value Range Comment   pH, Arterial 7.371  7.350 - 7.450    pCO2 arterial 44.8  35.0 - 45.0 mmHg    pO2, Arterial 451.0 (*) 80.0 - 100.0 mmHg    Bicarbonate 26.4 (*) 20.0 - 24.0 mEq/L    TCO2 28  0 - 100 mmol/L    O2 Saturation 100.0      Sodium 142  135 - 145 mEq/L    Potassium 5.5 (*) 3.5 - 5.1 mEq/L    Calcium, Ion 0.81 (*) 1.12 - 1.23 mmol/L    HCT 25.0 (*) 39.0 - 52.0 %    Hemoglobin 8.5 (*) 13.0 - 17.0 g/dL    Patient temperature 35.3 C      Sample type ARTERIAL      Dg Chest Port 1 View  08/09/2012  *RADIOLOGY REPORT*  Clinical Data: Vomiting blood for 4 days.  Abdominal pain.  PORTABLE CHEST - 1 VIEW  Comparison: 03/02/2012.  Findings: Trachea is midline.  Heart size normal.  Nasogastric tube is followed into the stomach.  Left apical pleural parenchymal scarring with retraction of the left hilum.  Lungs are low in volume but otherwise clear.  No pleural fluid.  IMPRESSION: No acute findings.   Original Report Authenticated By: Leanna Battles, M.D.    Dg Abd Portable 1v  08/09/2012  *RADIOLOGY REPORT*  Clinical Data: Evaluate for free intraperitoneal gas.  PORTABLE ABDOMEN -  1 VIEW  Comparison: None.  Findings: There are no disproportionally dilated loops of bowel. There is no obvious free intraperitoneal gas.  Free intraperitoneal gas cannot be excluded on a supine AP abdominal film.  Unremarkable soft tissues. NG tube tip is in the body of the stomach.  IMPRESSION: Nonobstructive bowel gas pattern.  No obvious free intraperitoneal gas.  See above comments.   Original Report Authenticated By: Jolaine Click, M.D.        Assessment/Plan 1. Acute UGI bleed secondary to duodenal ulcer with arterial bleed 2. Likely aspiration 3. Polysubstance abuse 4. ABL anemia 5. Hypovolemic shock secondary to number 4  Plan: 1. Patient is currently being resuscitated in the endoscopy suite.  CCM has been called for their assistance.  He will be intubated prior to needing an emergent laparotomy.  He has one unit of blood going, but we will initiate the massive blood transfusion protocol.  He has no family that he would like [place] to call per Dr. Mahala Menghini, who I called to alert of the situation.  Thank you for this consultation.  Rafel Garde E 08/10/2012, 2:56 PM Pager: [phone]

## 2012-08-10 NOTE — Op Note (Signed)
[facility] [address] [place], [address]   ENDOSCOPY PROCEDURE REPORT  PATIENT: [name], [name]  MR#: [id number] BIRTHDATE: 1961/02/07 , 52  yrs. old GENDER: Male ENDOSCOPIST: [name], MD PROCEDURE DATE:  08/10/2012 PROCEDURE:  EGD w/ control of bleeding ASA CLASS:     Class IV INDICATIONS:  hematemesis. MEDICATIONS: Fentanyl-Detailed and Versed-Detailed TOPICAL ANESTHETIC: Cetacaine Spray  DESCRIPTION OF PROCEDURE: After the risks benefits and alternatives of the procedure were thoroughly explained, informed consent was obtained.  The Pentax Gastroscope [id number] endoscope was introduced through the mouth and advanced to the second portion of the duodenum. Without limitations.  The instrument was slowly withdrawn as the mucosa was fully examined.    There was red blood in esophagus and proximal stomach.  There was active bleeding in duodenal bulb.  A small, pulsatile vessel was found in pool of blood in distal duodenum bulb.  A single endoclip was placed at the site and bleeding stopped.  A second endoclip was placed to ensure hemostasis and bleed returned, very briskly.  He was increaslingy hypotensive with SBP as low as 60s.  Procedure was terminated.  Rush blood was ordered.  Surgery and critical care were consulted and both evaluated patient in endoscopy.  He was intubated for airway protection and plan is to take him directly to the OR.  Retroflexed views revealed no abnormalities.     The scope was then withdrawn from the patient and the procedure completed.  COMPLICATIONS: There were no complications. ENDOSCOPIC IMPRESSION: There was red blood in esophagus and proximal stomach.  There was active bleeding in duodenal bulb.  A small, pulsatile vessel was found in pool of blood in distal duodenum bulb.  A single endoclip was placed at the site and bleeding stopped.  A second endoclip was placed to ensure hemostasis and bleed  returned, very briskly.  He was increaslingy hypotensive with SBP as low as 60s.  Procedure was terminated.  Rush blood was ordered.  Surgery and critical care were consulted and both evaluated patient in endoscopy.  He was intubated for airway protection and plan is to take him directly to the OR.  RECOMMENDATIONS: Emergent laparotomy.    eSigned:  [name], MD 08/10/2012 10:42 AM

## 2012-08-10 NOTE — Op Note (Signed)
OPERATIVE REPORT  DATE OF OPERATION: 08/09/2012 - 08/10/2012  PATIENT:  [name]  52 y.o. male  PRE-OPERATIVE DIAGNOSIS:  Duodenal hemorrhage from ulcer with hypotension  POST-OPERATIVE DIAGNOSIS:  Bleeding posterior duodenal bleeding ulcer  PROCEDURE:  Procedure(s): EXPLORATORY LAPAROTOMY  SURGEON:  Surgeon(s): [name], MD Liz Malady, MD  ASSISTANT: Osborne/Thompson  ANESTHESIA:   general  EBL: 1500 ml  BLOOD ADMINISTERED: 2500 CC PRBC  DRAINS: (One) Blake drain(s) in the Morrison's pouch   SPECIMEN:  No Specimen  COUNTS CORRECT:  YES  PROCEDURE DETAILS: The patient was taken to the operating room and placed on the table in the supine position. He was taken directly to the OR from the endoscopy suite after almost exsanguinating from a large posterior duodenal ulcer with visible bleeding.  After proper time out was performed identifying the patient and the procedure to be performed he was prepped and draped in usual sterile manner. A midline incision was made from the xiphoid down to just right side of the umbilicus. Was taken down to and through the midline fascia.  The small bowel and intestines are markedly distended from the gaseous distention from the endoscopy and also being filled with blood.  An NG tube was in place but had to be passed further into the stomach. Induration around the duodenal bulb was noted.  With the patient in reverse Trendelenburg and left side down a Kocher maneuver was performed mobilizing the second and third portion of the duodenum.  At the level of the pylorus 2 longitudinal stay sutures of 2-0 silk were placed in the full thickness wall. A longitudinal duodenotomy was made using electrocautery. As we entered into the duodenal bulb a large amount of old bloody and also fresh bloody fluid was obtained. There was also bleeding from the serosal wall which was controlled with a figure-of-eight stitch of 2-0 silk. With the stay  sutures in place and proper retraction using an Omni retractor we were able to identify the 2 cm posterior duodenal ulcer. There was active bleeding from a blood vessel that was visible in the center of the ulcer.  It took several stitches in order to control the bleeding from the ulcer, however we ultimately were able to do so with complete control of the bleeding from the posterior wall. After this was confirmed we closed the anterior duodenotomy using interrupted 2-0 silk sutures. A 19 mm Blake drain was placed lateral and posterior to the repair into Morrison's pouch.  The NG tube was in proper position in the mid to distal portion of the stomach well away from the reapproximated duodenum. The surgeon and the assistant changed gowns and gloves prior to irrigation which we did so with normal saline. The patient was very actively trying to breathe during the closure likely the because he metabolizes paralytics and other medications very quickly.  Once we irrigated the abdomen we reapproximated the fascia using running looped #1 PDS suture. Once this was done the skin was closed using stainless steel staples. A sterile dressing was applied. He remained intubated on the ventilator and taken back to the intensive care unit much more hemodynamically stable.  The patient did use multiple units of packed cells and FFP in the operating room. He also received platelets.  PATIENT DISPOSITION:  ICU - intubated and hemodynamically stable.   [name] 1/10/20143:28 PM

## 2012-08-10 NOTE — Consult Note (Signed)
PULMONARY  / CRITICAL CARE MEDICINE  Name: [name] MRN: [id number] DOB: 03/10/1961    LOS: 1  REFERRING MD :  [facility] / GI  CHIEF COMPLAINT:  Acute respiratory failure, shock  BRIEF PATIENT DESCRIPTION: 51 y/o AAM with PMH of GERD, ETOH abuse, ? Cocaine abuse admitted per [facility] on 1/9 with a week hx of  hematemesis, melena.  Underwent EGD on 1/10 with active duodenal bleeding.  Decompensated requiring intubation and emergent surgical consultation for laparotomy.  Returned to ICU on vent.    LINES / TUBES: 1/10 OETT>>> 1/10 R FEM Cordis>>> 1/10 L Rad Aline>>> 1/10 R Abd drain>>>  CULTURES:   ANTIBIOTICS:   SIGNIFICANT EVENTS:  1/9 - Admit per [facility] for hematemesis, dark stool 1/10 - EGD with active duodenal bleed, decompensated.  Intubated & to OR for Ex-Lap  LEVEL OF CARE:  ICU PRIMARY SERVICE:  TR[facility]-->PCCM CONSULTANTS:  GI, CCS CODE STATUS:  Full Code DIET:  NPO DVT Px:  SCD's GI Px:  Protonix gtt  HISTORY OF PRESENT ILLNESS:  52 y/o AAM with PMH of GERD, ETOH abuse, ? Cocaine abuse admitted per [facility] on 1/9 with a week hx of hematemesis and melena.  Required 3 units PRBC's on 1/9 with Hbg up to 7 gm.  Underwent EGD on 1/10 with active duodenal bleeding requiring endoclip.  However, once second clip was placed, he had return of bleeding.  Decompensated requiring intubation and emergent surgical consultation for laparotomy.  Returned to ICU on vent.Marland Kitchen    PAST MEDICAL HISTORY :  Past Medical History  Diagnosis Date  . Abdominal  pain, other specified site   . GERD (gastroesophageal reflux disease)   . Abnormal CT scan, esophagus 03/2012  . Substance abuse 03/2012    tox screen positive for cocaine.    Past Surgical History  Procedure Date  . Undescended testicle     on right. noted on 03/2012 CT scan.  no surgery referral.    Prior to Admission medications   Not on File   No Known Allergies  FAMILY HISTORY:  History reviewed. No pertinent family history. SOCIAL  HISTORY:  reports that he has been smoking.  He does not have any smokeless tobacco history on file. He reports that he drinks alcohol. He reports that he uses illicit drugs (Marijuana).  REVIEW OF SYSTEMS:  Unable to complete as pt is on vent, no family available.    INTERVAL HISTORY:  Sedate on vent, no distress.  VITAL SIGNS: Temp:  [97.5 F (36.4 C)-99.5 F (37.5 C)] 97.5 F (36.4 C) (01/10 1415) Pulse Rate:  [64-102] 64  (01/10 1530) Resp:  [6-31] 16  (01/10 1530) BP: (64-174)/(19-99) 149/94 mmHg (01/10 1545) SpO2:  [97 %-100 %] 100 % (01/10 1530) Arterial Line BP: (176-184)/(97-99) 184/99 mmHg (01/10 1500) FiO2 (%):  [39.9 %-60.1 %] 39.9 % (01/10 1530) Weight:  [160 lb 0.9 oz (72.6 kg)-163 lb 5.8 oz (74.1 kg)] 163 lb 5.8 oz (74.1 kg) (01/10 0500)  HEMODYNAMICS:    VENTILATOR SETTINGS: Vent Mode:  [-] PRVC FiO2 (%):  [39.9 %-60.1 %] 39.9 % Set Rate:  [16 bmp] 16 bmp Vt Set:  [550 mL] 550 mL PEEP:  [5 cmH20] 5 cmH20 Plateau Pressure:  [30 cmH20-32 cmH20] 30 cmH20  INTAKE / OUTPUT: Intake/Output      01/09 0701 - 01/10 0700 01/10 0701 - 01/11 0700   P.O. 590    I.V. (mL/kg) 3070 (41.4) 2000 (27)   Blood 1282.8 4186   NG/GT  200    Total Intake(mL/kg) 5142.8 (69.4) 6186 (83.5)   Urine (mL/kg/hr) 1350 (0.8) 725 (1.1)   Drains  60   Other 300    Blood  1500   Total Output 1650 2285   Net +3492.8 +3901        Stool Occurrence 1 x      PHYSICAL EXAMINATION: General:  wdwn adult male in NAD Neuro:  sedate HEENT:  Mm pink/moist, OETT Cardiovascular:  s1s2 rrr, no m/r/g Lungs:  resp's even/non-labored on vent, lungs bilaterally coarse, no wheeze Abdomen:  Round /tight, midline incision, JP x1 , bsx4 hypoactive Musculoskeletal:  No acute deformities Skin:  Warm/dry, no edema  LABS: Cbc  Lab 08/10/12 1253 08/10/12 1246 08/10/12 1149 08/10/12 0414 08/09/12 1356  WBC -- 19.5* -- -- --  HGB 8.5* 8.9* 7.1* -- --  HCT 25.0* 26.0* 21.0* -- --  PLT -- 106* -- 87*  126*   Chemistry  Lab 08/10/12 1253 08/10/12 1149 08/09/12 1356  NA 142 145 141  K 5.5* 4.8 3.4*  CL -- -- 103  CO2 -- -- 28  BUN -- -- 31*  CREATININE -- -- 1.33  CALCIUM -- -- 7.7*  MG -- -- --  PHOS -- -- --  GLUCOSE -- -- 133*   Liver fxn  Lab 08/09/12 1356  AST 13  ALT 7  ALKPHOS 36*  BILITOT 0.1*  PROT 4.3*  ALBUMIN 2.1*   coags  Lab 08/10/12 1246 08/09/12 1634  APTT 31 --  INR 1.37 1.35   Sepsis markers No results found for this basename: LATICACIDVEN:3,PROCALCITON:3 in the last 168 hours  Cardiac markers  Lab 08/09/12 1402  CKTOTAL --  CKMB --  TROPONINI <0.30   BNP No results found for this basename: PROBNP:3 in the last 168 hours  ABG  Lab 08/10/12 1523 08/10/12 1253 08/10/12 1149  PHART 7.372 7.371 7.254*  PCO2ART 42.1 44.8 54.5*  PO2ART 213.0* 451.0* 399.0*  HCO3 24.4* 26.4* 24.5*  TCO2 26 28 26     CBG trend No results found for this basename: GLUCAP:5 in the last 168 hours  IMAGING: 1/10 CXR>>>  ECG: 1/10>>>  DIAGNOSES: Principal Problem:  *Status post laparotomy with oversew of bleeding duodenal artery Active Problems:  Acute respiratory failure  Duodenal ulcer  Anemia due to GI blood loss  Hypertension  Alcohol abuse  Encephalopathy acute   ASSESSMENT / PLAN:  PULMONARY  ASSESSMENT: Acute respiratory failure - in setting of ex-lap, duodenal bleeding ? Aspiration Event - vomiting / bleeding during intubation  PLAN:   -full vent support, SBT in am -propofol overnight with fentanyl gtt -  hopeful to be able to extubate in am (ok with surgery if meets criteria) -abg / cxr now and in am   CARDIOVASCULAR  ASSESSMENT:  Hypertension  Hx of Cocaine Abuse - UDS neg for cocaine on admit 1/9 Hypotension - Resolved, occurred during active bleed prior to surgery  PLAN:  -no beta blocker -cycle enzymes with hypotension, concern for demand ischemia -ekg now -PRN hydralazine for SBP <170 -propofol for sedation agent /  BP effect  RENAL  ASSESSMENT:   No known Hx.    PLAN:   -Assess post-op labs now  GASTROINTESTINAL  ASSESSMENT:   Hematemesis  - in setting of duodenal bleed S/P Exploratory Laparotomy 1/10 -   PLAN:   -post-op care per CCS -IV protonix gtt -NPO -NGT / Further feedings per CCS  HEMATOLOGIC  ASSESSMENT:   Acute Blood Loss Anemia  PLAN:  -now CBC with Diff, repeat at MN and in am -transfusion Hgb <7 %, new active bleeding or MI (<8)   INFECTIOUS  ASSESSMENT:   Rule out Aspiration PNA - ? Aspiration event during intubation with vomiting / bleeding  PLAN:   -assess cxr now -hold abx  ENDOCRINE  ASSESSMENT:   No hx of DM  PLAN:   CBG Q6 while NPO  NEUROLOGIC  ASSESSMENT:   Acute Encephalopathy - in setting of acute blood loss anemia, post op laparotomy Hx of Polysubstance Abuse   PLAN:   -monitor mental status, WUA in am -will need to monitor for DT's closely once sedation off / extubated  I have personally obtained a history, examined the patient, evaluated laboratory and imaging results, formulated the assessment and plan and placed orders.   CRITICAL CARE: The patient is critically ill with multiple organ systems failure and requires high complexity decision making for assessment and support, frequent evaluation and titration of therapies, application of advanced monitoring technologies and extensive interpretation of multiple databases. Critical Care Time devoted to patient care services described in this note is 45 minutes.   Discussed with Dr Christella Hartigan and Dr Lindie Spruce    Billy Fischer, MD ; [facility] [phone].  After 5:30 PM or weekends, call [phone]   08/10/2012, 3:49 PM

## 2012-08-10 NOTE — Progress Notes (Signed)
INITIAL NUTRITION ASSESSMENT  DOCUMENTATION CODES Per approved criteria  -Not Applicable   INTERVENTION:  Recommend nutrition support initiation (EN vs TPN) within 24-48 hours if prolonged intubation expected RD to follow for nutrition care plan  NUTRITION DIAGNOSIS: Inadequate oral intake related to inability to eat as evidenced by NPO status  Goal: Oral intake vs nutrition support to meet >/= 90% of estimated nutrition needs  Monitor:  Nutrition support initiation, respiratory status, weight, labs, I/O's  Reason for Assessment: VDRF  52 y.o. male  Admitting Dx: GI bleed  ASSESSMENT: Patient admitted after passing out in the yard after vomiting blood; s/p EGD this AM ---> red blood in esophagus and proximal stomach as well as active bleeding in duodenal bulb; patient became increaslingy hypotensive and procedure was terminated; taken to OR for emergent laparotomy.  Patient is currently intubated on ventilator support MV: 8.3 Temp: 36.4  Height: Ht Readings from Last 1 Encounters:  08/09/12 5\' 9"  (1.753 m)    Weight: Wt Readings from Last 1 Encounters:  08/10/12 163 lb 5.8 oz (74.1 kg)    Ideal Body Weight: 72.7 kg  % Ideal Body Weight: 101%  Wt Readings from Last 10 Encounters:  08/10/12 163 lb 5.8 oz (74.1 kg)  08/10/12 163 lb 5.8 oz (74.1 kg)  08/10/12 163 lb 5.8 oz (74.1 kg)  08/10/12 163 lb 5.8 oz (74.1 kg)  08/10/12 163 lb 5.8 oz (74.1 kg)    Usual Body Weight: unable to obtain  % Usual Body Weight: ---  BMI:  Body mass index is 24.12 kg/(m^2).  Estimated Nutritional Needs: Kcal: 1650-1750 Protein: 110-120 gm Fluid: 1.6-1.7 L  Skin: surgical abdominal incision   Diet Order: NPO  EDUCATION NEEDS: -No education needs identified at this time   Intake/Output Summary (Last 24 hours) at 08/10/12 1457 Last data filed at 08/10/12 1338  Gross per 24 hour  Intake 11153.83 ml  Output   3775 ml  Net 7378.83 ml    Labs:   Lab 08/10/12 1253  08/10/12 1149 08/09/12 1356  NA 142 145 141  K 5.5* 4.8 3.4*  CL -- -- 103  CO2 -- -- 28  BUN -- -- 31*  CREATININE -- -- 1.33  CALCIUM -- -- 7.7*  MG -- -- --  PHOS -- -- --  GLUCOSE -- -- 133*    CBG (last 3)  No results found for this basename: GLUCAP:3 in the last 72 hours  Scheduled Meds:   . folic acid  1 mg Oral Daily  . metoprolol      . multivitamin with minerals  1 tablet Oral Daily  . propofol      . sodium chloride  3 mL Intravenous Q12H  . thiamine  100 mg Oral Daily   Or  . thiamine  100 mg Intravenous Daily    Continuous Infusions:   . sodium chloride 200 mL/hr (08/10/12 0738)  . fentaNYL infusion INTRAVENOUS    . pantoprozole (PROTONIX) infusion 8 mg/hr (08/10/12 0428)  . propofol      Past Medical History  Diagnosis Date  . Abdominal  pain, other specified site   . GERD (gastroesophageal reflux disease)   . Abnormal CT scan, esophagus 03/2012  . Substance abuse 03/2012    tox screen positive for cocaine.     Past Surgical History  Procedure Date  . Undescended testicle     on right. noted on 03/2012 CT scan.  no surgery referral.     Maureen Chatters, RD,  LDN Pager #: [phone] After-Hours Pager #: [phone]

## 2012-08-10 NOTE — Transfer of Care (Signed)
Immediate Anesthesia Transfer of Care Note  Patient: [name]  Procedure(s) Performed: Procedure(s) (LRB) with comments: EXPLORATORY LAPAROTOMY (N/A)  Patient Location: PACU  Anesthesia Type:General  Level of Consciousness: sedated and unresponsive  Airway & Oxygen Therapy: Patient remains intubated per anesthesia plan and Patient placed on Ventilator (see vital sign flow sheet for setting)  Post-op Assessment: Report given to PACU RN and Post -op Vital signs reviewed and stable  Post vital signs: Reviewed and stable  Complications: No apparent anesthesia complications

## 2012-08-10 NOTE — Progress Notes (Signed)
During egd procedure, pt. actively bleeding in distal duodenum.  BP 70's systolic. Dr. Christella Hartigan notified surgeons after attemptinh clips x2.  2 units packed cells ordered per Dr. Christella Hartigan.  Fentany 25mg . Ordered per Dr. Sung Amabile and Versed 2.5 mg in 2 increments.  Pt. Intubated per Dr. Sung Amabile.  1srt unit of blood checked and hung infusing wide open.  BP then in 100 systolic range.  Pt not responsive at this time.  IVF's running at 999 Normal saline.  OR staff present and took the pt to the OR monitored en route.

## 2012-08-10 NOTE — Progress Notes (Signed)
Foley catheter removed per patient request.

## 2012-08-11 DIAGNOSIS — K922 Gastrointestinal hemorrhage, unspecified: Secondary | ICD-10-CM

## 2012-08-11 DIAGNOSIS — F1010 Alcohol abuse, uncomplicated: Secondary | ICD-10-CM

## 2012-08-11 LAB — COMPREHENSIVE METABOLIC PANEL
AST: 30 U/L (ref 0–37)
Albumin: 2.4 g/dL — ABNORMAL LOW (ref 3.5–5.2)
BUN: 19 mg/dL (ref 6–23)
CO2: 26 mEq/L (ref 19–32)
Calcium: 8 mg/dL — ABNORMAL LOW (ref 8.4–10.5)
Calcium: 8 mg/dL — ABNORMAL LOW (ref 8.4–10.5)
Creatinine, Ser: 0.98 mg/dL (ref 0.50–1.35)
Creatinine, Ser: 0.96 mg/dL (ref 0.50–1.35)
GFR calc Af Amer: >90 mL/min (ref >90)
GFR calc non Af Amer: >90 mL/min (ref >90)
GFR calc non Af Amer: >90 mL/min (ref >90)
Glucose, Bld: 103 mg/dL — ABNORMAL HIGH (ref 70–99)

## 2012-08-11 LAB — PREPARE PLATELET PHERESIS: Unit division: 0

## 2012-08-11 LAB — PROCALCITONIN: Procalcitonin: 0.15 ng/mL

## 2012-08-11 LAB — CBC
HCT: 27.5 % — ABNORMAL LOW (ref 39.0–52.0)
HCT: 28.8 % — ABNORMAL LOW (ref 39.0–52.0)
HCT: 31.5 % — ABNORMAL LOW (ref 39.0–52.0)
Hemoglobin: 10 g/dL — ABNORMAL LOW (ref 13.0–17.0)
Hemoglobin: 10.6 g/dL — ABNORMAL LOW (ref 13.0–17.0)
MCH: 29.8 pg (ref 26.0–34.0)
MCH: 29.5 pg (ref 26.0–34.0)
MCH: 28.8 pg (ref 26.0–34.0)
MCHC: 33.7 g/dL (ref 30.0–36.0)
MCV: 82.8 fL (ref 78.0–100.0)
MCV: 85 fL (ref 78.0–100.0)
Platelets: 99 10*3/uL — ABNORMAL LOW (ref 150–400)
RBC: 3.39 MIL/uL — ABNORMAL LOW (ref 4.22–5.81)
RDW: 15.3 % (ref 11.5–15.5)
RDW: 15.7 % — ABNORMAL HIGH (ref 11.5–15.5)

## 2012-08-11 LAB — PREPARE FRESH FROZEN PLASMA
Unit division: 0
Unit division: 0
Unit division: 0

## 2012-08-11 LAB — TROPONIN I
Troponin I: <0.3 ng/mL (ref <0.30)
Troponin I: <0.3 ng/mL (ref <0.30)
Troponin I: <0.3 ng/mL (ref <0.30)

## 2012-08-11 MED ORDER — FENTANYL CITRATE 0.05 MG/ML IJ SOLN
INTRAMUSCULAR | Status: AC
  Administered 2012-08-11: 100 ug via INTRAVENOUS
  Filled 2012-08-11: qty 2

## 2012-08-11 MED ORDER — IPRATROPIUM BROMIDE 0.02 % IN SOLN
RESPIRATORY_TRACT | Status: AC
  Administered 2012-08-12: 0.5 mg via RESPIRATORY_TRACT
  Filled 2012-08-11: qty 2.5

## 2012-08-11 MED ORDER — IPRATROPIUM BROMIDE 0.02 % IN SOLN
0.5000 mg | Freq: Four times a day (QID) | RESPIRATORY_TRACT | Status: DC
  Administered 2012-08-11 – 2012-08-13 (×7): 0.5 mg via RESPIRATORY_TRACT
  Filled 2012-08-11 (×6): qty 2.5

## 2012-08-11 MED ORDER — FENTANYL CITRATE 0.05 MG/ML IJ SOLN
25.0000 ug | INTRAMUSCULAR | Status: DC | PRN
  Administered 2012-08-11: 100 ug via INTRAVENOUS
  Administered 2012-08-11: 50 ug via INTRAVENOUS
  Administered 2012-08-11 – 2012-08-12 (×6): 100 ug via INTRAVENOUS
  Filled 2012-08-11 (×7): qty 2

## 2012-08-11 MED ORDER — ALBUTEROL SULFATE (5 MG/ML) 0.5% IN NEBU
INHALATION_SOLUTION | RESPIRATORY_TRACT | Status: AC
  Administered 2012-08-12: 2.5 mg via RESPIRATORY_TRACT
  Filled 2012-08-11: qty 0.5

## 2012-08-11 MED ORDER — FENTANYL BOLUS VIA INFUSION
25.0000 ug | INTRAVENOUS | Status: DC | PRN
  Filled 2012-08-11: qty 100

## 2012-08-11 MED ORDER — ALBUTEROL SULFATE HFA 108 (90 BASE) MCG/ACT IN AERS
2.0000 | INHALATION_SPRAY | RESPIRATORY_TRACT | Status: DC | PRN
  Administered 2012-08-15: 2 via RESPIRATORY_TRACT
  Filled 2012-08-11 (×3): qty 6.7

## 2012-08-11 MED ORDER — ALBUTEROL SULFATE (5 MG/ML) 0.5% IN NEBU
2.5000 mg | INHALATION_SOLUTION | Freq: Four times a day (QID) | RESPIRATORY_TRACT | Status: DC
  Administered 2012-08-11 – 2012-08-13 (×7): 2.5 mg via RESPIRATORY_TRACT
  Filled 2012-08-11 (×6): qty 0.5

## 2012-08-11 NOTE — Procedures (Signed)
Extubation Procedure Note   Patient Details:   Name: [name] DOB: 1960/11/25 MRN: [id number]   Airway Documentation:    Evaluation  O2 sats: stable throughout Complications: No apparent complications Patient did tolerate procedure well. Bilateral Breath Sounds: Clear Suctioning: Airway Yes  Patient weaned from ventilator and extubated. VC>810cc's and good cuff leak. He is now wearing nasal cannula with oxygen running at 4lpm.

## 2012-08-11 NOTE — Progress Notes (Signed)
1 Day Post-Op  Subjective: ON VENT STABLE  Objective: Vital signs in last 24 hours: Temp:  [97.5 F (36.4 C)-99.3 F (37.4 C)] 98 F (36.7 C) (01/11 0726) Pulse Rate:  [61-98] 61  (01/11 0700) Resp:  [11-31] 16  (01/11 0700) BP: (64-174)/(19-103) 114/54 mmHg (01/11 0700) SpO2:  [99 %-100 %] 100 % (01/11 0700) Arterial Line BP: (122-184)/(57-99) 138/58 mmHg (01/11 0700) FiO2 (%):  [39.7 %-60.1 %] 40.1 % (01/11 0700) Weight:  [179 lb 0.2 oz (81.2 kg)] 179 lb 0.2 oz (81.2 kg) (01/11 0500)    Intake/Output from previous day: 01/10 0701 - 01/11 0700 In: 7686.5 [I.V.:3390.5; HYQMV:7846; NG/GT:110] Out: 3400 [Urine:1335; Emesis/NG output:150; Drains:415; Blood:1500] Intake/Output this shift:    Incision/Wound:DRESSING WITH MINIMAL DRAINAGE. JPSEROBLOODY DRAINAG  Lab Results:   [facility] 08/11/12 0317 08/10/12 1530  WBC 17.8* 14.3*  HGB 9.9* 10.8*  HCT 27.5* 30.7*  PLT 99* 109*   BMET  Basename 08/11/12 0317 08/11/12 0206  NA 142 144  K 4.0 4.0  CL 108 110  CO2 26 26  GLUCOSE 104* 103*  BUN 19 19  CREATININE 0.96 0.98  CALCIUM 8.0* 8.0*   PT/INR  Basename 08/10/12 1530 08/10/12 1246  LABPROT 14.9 16.5*  INR 1.19 1.37   ABG  Basename 08/10/12 1523 08/10/12 1253  PHART 7.372 7.371  HCO3 24.4* 26.4*    Studies/Results: Dg Chest Port 1 View  08/10/2012  *RADIOLOGY REPORT*  Clinical Data: Intubation, evaluate endotracheal tube placement  PORTABLE CHEST - 1 VIEW  Comparison: Portable exam 1515 hours compared to 08/09/2012  Findings: Tip of endotracheal tube 2.6 cm above carina. Nasogastric tube coiled in proximal stomach. Upper normal heart size. Stable mediastinal contours. Left upper lobe scarring. New left lower lobe infiltrate question pneumonia versus aspiration. Mild atelectasis versus early infiltrate at right base as well. No gross pleural effusion or pneumothorax.  IMPRESSION: Satisfactory endotracheal tube position. New left lower lobe infiltrate with  atelectasis versus consolidation at right base as well. Persistent left upper lobe scarring   Original Report Authenticated By: Ulyses Southward, M.D.    Dg Chest Port 1 View  08/09/2012  *RADIOLOGY REPORT*  Clinical Data: Vomiting blood for 4 days.  Abdominal pain.  PORTABLE CHEST - 1 VIEW  Comparison: 03/02/2012.  Findings: Trachea is midline.  Heart size normal.  Nasogastric tube is followed into the stomach.  Left apical pleural parenchymal scarring with retraction of the left hilum.  Lungs are low in volume but otherwise clear.  No pleural fluid.  IMPRESSION: No acute findings.   Original Report Authenticated By: Leanna Battles, M.D.    Dg Abd Portable 1v  08/09/2012  *RADIOLOGY REPORT*  Clinical Data: Evaluate for free intraperitoneal gas.  PORTABLE ABDOMEN - 1 VIEW  Comparison: None.  Findings: There are no disproportionally dilated loops of bowel. There is no obvious free intraperitoneal gas.  Free intraperitoneal gas cannot be excluded on a supine AP abdominal film.  Unremarkable soft tissues. NG tube tip is in the body of the stomach.  IMPRESSION: Nonobstructive bowel gas pattern.  No obvious free intraperitoneal gas.  See above comments.   Original Report Authenticated By: Jolaine Click, M.D.     Anti-infectives: Anti-infectives    None      Assessment/Plan: s/p Procedure(s) (LRB) with comments: EXPLORATORY LAPAROTOMY (N/A) Looks well Continue protonix drip for now. Vent per CCM Hopefully extubate soon. Continue NGT  LOS: 2 days    Shandale Malak A. 08/11/2012

## 2012-08-11 NOTE — Consult Note (Signed)
This patient was exsanguinating from a large posterior duodenal ulcer with a visible vessel.  After intubation in the endoscopy suite he was taken to the OR for oversew.  This patient has been seen and I agree with the findings and treatment plan. Marta Lamas. Gae Bon, MD, FACS [phone] [facility]

## 2012-08-11 NOTE — Progress Notes (Signed)
200 cc of Fentanyl wasted in sink from IV infusion bag.  50 cc of Diprivan wasted in sink from IV infusion bottle.  Both wastes witnessed by 2nd RN, Margaree Mackintosh.

## 2012-08-11 NOTE — Consult Note (Signed)
PULMONARY  / CRITICAL CARE MEDICINE  Name: [name] MRN: [id number] DOB: June 20, 1961    LOS: 2  REFERRING MD :  TRH / GI  CHIEF COMPLAINT:  Acute respiratory failure, shock  BRIEF PATIENT DESCRIPTION: 52 y/o AAM with PMH of GERD, ETOH abuse, ? Cocaine abuse admitted per [facility] on 1/9 with a week hx of  hematemesis, melena.  Underwent EGD on 1/10 with active duodenal bleeding.  Decompensated requiring intubation and emergent surgical consultation for laparotomy.  Returned to ICU on vent.    LINES / TUBES: 1/10 OETT>>> 1/10 R FEM Cordis>>> 1/10 L Rad Aline>>> 1/10 R Abd drain>>>  CULTURES: Results for orders placed during the hospital encounter of 08/09/12  MRSA PCR SCREENING     Status: Normal   Collection Time   08/09/12  7:26 PM      Component Value Range Status Comment   MRSA by PCR NEGATIVE  NEGATIVE Final      ANTIBIOTICS: Anti-infectives    None       SIGNIFICANT EVENTS:  1/9 - Admit per [facility] for hematemesis, dark stool 1/10 - EGD with active duodenal bleed, decompensated.  Intubated & to OR for Ex-Lap  LEVEL OF CARE:  ICU PRIMARY SERVICE:  TRH-->PCCM CONSULTANTS:  GI, CCS CODE STATUS:  Full Code DIET:  NPO DVT Px:  SCD's GI Px:  Protonix gtt  HISTORY OF PRESENT ILLNESS:  52 y/o AAM with PMH of GERD, ETOH abuse, ? Cocaine abuse admitted per [facility] on 1/9 with a week hx of hematemesis and melena.  Required 3 units PRBC's on 1/9 with Hbg up to 7 gm.  Underwent EGD on 1/10 with active duodenal bleeding requiring endoclip.  However, once second clip was placed, he had return of bleeding.  Decompensated requiring intubation and emergent surgical consultation for laparotomy.  Returned to ICU on vent.Marland Kitchen    EVENTS.    INTERVAL HISTORY:    08/11/2012: Meets extubation criteria. No active bleeding. Hgb up   VITAL SIGNS: Temp:  [97.5 F (36.4 C)-99.3 F (37.4 C)] 98.4 F (36.9 C) (01/11 1121) Pulse Rate:  [61-103] 103  (01/11 1101) Resp:  [12-20] 20  (01/11 1101) BP:  (106-174)/(51-103) 172/72 mmHg (01/11 1115) SpO2:  [99 %-100 %] 99 % (01/11 1100) Arterial Line BP: (118-184)/(54-99) 172/65 mmHg (01/11 1100) FiO2 (%):  [39.7 %-60.1 %] 40 % (01/11 1102) Weight:  [81.2 kg (179 lb 0.2 oz)] 81.2 kg (179 lb 0.2 oz) (01/11 0500)  HEMODYNAMICS:    VENTILATOR SETTINGS: Vent Mode:  [-] PSV FiO2 (%):  [39.7 %-60.1 %] 40 % Set Rate:  [16 bmp] 16 bmp Vt Set:  [550 mL] 550 mL PEEP:  [5 cmH20] 5 cmH20 Pressure Support:  [5 cmH20-10 cmH20] 5 cmH20 Plateau Pressure:  [26 cmH20-32 cmH20] 26 cmH20  INTAKE / OUTPUT: Intake/Output      01/10 0701 - 01/11 0700 01/11 0701 - 01/12 0700   P.O.     I.V. (mL/kg) 3390.5 (41.8) 200 (2.5)   Blood 4186    NG/GT 110 30   Total Intake(mL/kg) 7686.5 (94.7) 230 (2.8)   Urine (mL/kg/hr) 1335 (0.7) 110 (0.3)   Emesis/NG output 150    Drains 415 265   Other     Blood 1500    Total Output 3400 375   Net +4286.5 -145          PHYSICAL EXAMINATION: General:  Male on vent Neuro:  On diprivan and fent gtt -> RASS 0, CAm_ICU neg for delirium.  Asking for extubation HEENT:  Mm pink/moist, OETT Cardiovascular:  s1s2 rrr, no m/r/g Lungs:  resp's even/non-labored on vent, lungs bilaterally coarse, no wheeze Abdomen:  Round /tight, midline incision, JP x1 , bsx4 hypoactive Musculoskeletal:  No acute deformities Skin:  Warm/dry, no edema  LABS: Cbc  Lab 08/11/12 1049 08/11/12 0317 08/10/12 1530  WBC 17.5* -- --  HGB 10.0* 9.9* 10.8*  HCT 28.8* 27.5* 30.7*  PLT 104* 99* 109*   Chemistry  Lab 08/11/12 0317 08/11/12 0206 08/10/12 1530  NA 142 144 144  K 4.0 4.0 3.7  CL 108 110 110  CO2 26 26 24   BUN 19 19 23   CREATININE 0.96 0.98 0.83  CALCIUM 8.0* 8.0* 7.6*  MG -- 1.6 --  PHOS -- -- --  GLUCOSE 104* 103* 154*   Liver fxn  Lab 08/11/12 0317 08/11/12 0206 08/09/12 1356  AST 30 29 13   ALT 23 24 7   ALKPHOS 45 46 36*  BILITOT 0.6 0.6 0.1*  PROT 4.8* 4.8* 4.3*  ALBUMIN 2.4* 2.4* 2.1*   coags  Lab 08/10/12  1530 08/10/12 1246 08/09/12 1634  APTT -- 31 --  INR 1.19 1.37 1.35   Sepsis markers No results found for this basename: LATICACIDVEN:3,PROCALCITON:3 in the last 168 hours  Cardiac markers  Lab 08/11/12 0930 08/11/12 0317 08/10/12 1534  CKTOTAL -- -- --  CKMB -- -- --  TROPONINI <0.30 <0.30 <0.30   BNP No results found for this basename: PROBNP:3 in the last 168 hours  ABG  Lab 08/10/12 1523 08/10/12 1253 08/10/12 1149  PHART 7.372 7.371 7.254*  PCO2ART 42.1 44.8 54.5*  PO2ART 213.0* 451.0* 399.0*  HCO3 24.4* 26.4* 24.5*  TCO2 26 28 26     CBG trend  Lab 08/10/12 1735  GLUCAP 124*    IMAGING:  Dg Chest Port 1 View  08/10/2012  *RADIOLOGY REPORT*  Clinical Data: Intubation, evaluate endotracheal tube placement  PORTABLE CHEST - 1 VIEW  Comparison: Portable exam 1515 hours compared to 08/09/2012  Findings: Tip of endotracheal tube 2.6 cm above carina. Nasogastric tube coiled in proximal stomach. Upper normal heart size. Stable mediastinal contours. Left upper lobe scarring. New left lower lobe infiltrate question pneumonia versus aspiration. Mild atelectasis versus early infiltrate at right base as well. No gross pleural effusion or pneumothorax.  IMPRESSION: Satisfactory endotracheal tube position. New left lower lobe infiltrate with atelectasis versus consolidation at right base as well. Persistent left upper lobe scarring   Original Report Authenticated By: Ulyses Southward, M.D.    Dg Chest Port 1 View  08/09/2012  *RADIOLOGY REPORT*  Clinical Data: Vomiting blood for 4 days.  Abdominal pain.  PORTABLE CHEST - 1 VIEW  Comparison: 03/02/2012.  Findings: Trachea is midline.  Heart size normal.  Nasogastric tube is followed into the stomach.  Left apical pleural parenchymal scarring with retraction of the left hilum.  Lungs are low in volume but otherwise clear.  No pleural fluid.  IMPRESSION: No acute findings.   Original Report Authenticated By: Leanna Battles, M.D.    Dg Abd  Portable 1v  08/09/2012  *RADIOLOGY REPORT*  Clinical Data: Evaluate for free intraperitoneal gas.  PORTABLE ABDOMEN - 1 VIEW  Comparison: None.  Findings: There are no disproportionally dilated loops of bowel. There is no obvious free intraperitoneal gas.  Free intraperitoneal gas cannot be excluded on a supine AP abdominal film.  Unremarkable soft tissues. NG tube tip is in the body of the stomach.  IMPRESSION: Nonobstructive bowel gas pattern.  No obvious free intraperitoneal gas.  See above comments.   Original Report Authenticated By: Jolaine Click, M.D.       DIAGNOSES: Principal Problem:  *Status post laparotomy with oversew of bleeding duodenal artery Active Problems:  Anemia due to GI blood loss  Acute respiratory failure  Hypertension  Duodenal ulcer  Alcohol abuse  Encephalopathy acute   ASSESSMENT / PLAN:  PULMONARY  ASSESSMENT: Acute respiratory failure - in setting of ex-lap, duodenal bleeding ? Aspiration Event - vomiting / bleeding during intubation  08/11/2012: meets extubation criteria but currently on diprivan and fent gtt   PLAN:   - extubate if maintains status off sedation gtt   CARDIOVASCULAR   Lab 08/11/12 0930 08/11/12 0317 08/10/12 1534 08/09/12 1402  TROPONINI <0.30 <0.30 <0.30 <0.30   No results found for this basename: PROBNP:5 in the last 168 hours  ASSESSMENT:  Hypertension  Hx of Cocaine Abuse - UDS neg for cocaine on admit 1/9 Hypotension - Resolved, occurred during active bleed prior to surgery  On 08/11/2012: Normal hemodynamics  PLAN:  -no beta blocker -dc propofol for sedation agent / BP effect  RENAL  Lab 08/11/12 0317 08/11/12 0206 08/10/12 1530 08/10/12 1253 08/10/12 1149 08/09/12 1356  NA 142 144 144 142 145 --  K 4.0 4.0 -- -- -- --  CL 108 110 110 -- -- 103  CO2 26 26 24  -- -- 28  GLUCOSE 104* 103* 154* -- -- 133*  BUN 19 19 23  -- -- 31*  CREATININE 0.96 0.98 0.83 -- -- 1.33  CALCIUM 8.0* 8.0* 7.6* -- -- 7.7*  MG --  1.6 -- -- -- --  PHOS -- -- -- -- -- --    ASSESSMENT:   No known Hx.    PLAN:   -monitor  GASTROINTESTINAL  Lab 08/11/12 0317 08/11/12 0206 08/10/12 1530 08/10/12 1246 08/09/12 1634 08/09/12 1356  AST 30 29 -- -- -- 13  ALT 23 24 -- -- -- 7  ALKPHOS 45 46 -- -- -- 36*  BILITOT 0.6 0.6 -- -- -- 0.1*  PROT 4.8* 4.8* -- -- -- 4.3*  ALBUMIN 2.4* 2.4* -- -- -- 2.1*  INR -- -- 1.19 1.37 1.35 --    ASSESSMENT:   Hematemesis  - in setting of duodenal bleed S/P Exploratory Laparotomy 1/10 -   On 08/11/2012: no active bleeding  PLAN:   -post-op care per CCS -IV protonix gtt -NPO -NGT / Further feedings per CCS  HEMATOLOGIC  Lab 08/11/12 1049 08/11/12 0317 08/10/12 1530  HGB 10.0* 9.9* 10.8*  HCT 28.8* 27.5* 30.7*  WBC 17.5* 17.8* 14.3*  PLT 104* 99* 109*    ASSESSMENT:   Acute Blood Loss Anemia   PLAN:  -now CBC with Diff, repeat at MN and in am -transfusion Hgb <7 % even with bleeding unless hemodynamically unstable than prbc per bp/hr  INFECTIOUS No results found for this basename: PROCALCITON:5 in the last 168 hours  ASSESSMENT:   Rule out Aspiration PNA - ? Aspiration event during intubation with vomiting / bleeding  PLAN:   -hold abx  ENDOCRINE  ASSESSMENT:   No hx of DM  PLAN:   CBG Q6 while NPO  NEUROLOGIC  ASSESSMENT:   Acute Encephalopathy - in setting of acute blood loss anemia, post op laparotomy Hx of Polysubstance Abuse    On 08/11/2012: normal mental status   PLAN:   -monitor mental status, WUA in am -will need to monitor for DT's closely once sedation off /  extubated; use precedex - fent prn for pain  I have personally obtained a history, examined the patient, evaluated laboratory and imaging results, formulated the assessment and plan and placed orders.   CRITICAL CARE: The patient is critically ill with multiple organ systems failure and requires high complexity decision making for assessment and support, frequent evaluation  and titration of therapies, application of advanced monitoring technologies and extensive interpretation of multiple databases. Critical Care Time devoted to patient care services described in this note is 45 minutes.     Dr. Kalman Shan, M.D., [facility].C.P Pulmonary and Critical Care Medicine Staff Physician [facility] [place] [facility] Pager: [phone], If no answer or between  15:00h - 7:00h: call [phone]  08/11/2012 12:32 PM

## 2012-08-11 NOTE — Procedures (Deleted)
Extubation Procedure Note  Patient Details:   Name: Lacy Taglieri DOB: [birth year] MRN: [id number]   Airway Documentation:  Patient weaned from ventilator and extubated. Patient is wearing nasal cannula with oxygen running at 4lpm.  Evaluation  O2 sats: stable throughout Complications: No apparent complications Patient did tolerate procedure well. Bilateral Breath Sounds: Clear Suctioning: Airway Yes  Clearance Coots 08/11/2012, 2:36 PM

## 2012-08-12 LAB — CBC
HCT: 29 % — ABNORMAL LOW (ref 39.0–52.0)
MCH: 29.5 pg (ref 26.0–34.0)
MCH: 29.7 pg (ref 26.0–34.0)
MCHC: 33.9 g/dL (ref 30.0–36.0)
MCHC: 34.1 g/dL (ref 30.0–36.0)
MCHC: 34 g/dL (ref 30.0–36.0)
MCV: 87.1 fL (ref 78.0–100.0)
Platelets: 132 10*3/uL — ABNORMAL LOW (ref 150–400)
Platelets: 123 10*3/uL — ABNORMAL LOW (ref 150–400)
RBC: 3.66 MIL/uL — ABNORMAL LOW (ref 4.22–5.81)
RDW: 15.4 % (ref 11.5–15.5)

## 2012-08-12 LAB — PREPARE FRESH FROZEN PLASMA
Unit division: 0
Unit division: 0
Unit division: 0
Unit division: 0

## 2012-08-12 LAB — PROCALCITONIN: Procalcitonin: 0.15 ng/mL

## 2012-08-12 LAB — MAGNESIUM: Magnesium: 2.1 mg/dL (ref 1.5–2.5)

## 2012-08-12 MED ORDER — INSULIN ASPART 100 UNIT/ML ~~LOC~~ SOLN
0.0000 [IU] | SUBCUTANEOUS | Status: DC
  Administered 2012-08-13 – 2012-08-14 (×2): 1 [IU] via SUBCUTANEOUS

## 2012-08-12 MED ORDER — CLINIMIX E/DEXTROSE (5/15) 5 % IV SOLN
INTRAVENOUS | Status: DC
  Filled 2012-08-12: qty 2000

## 2012-08-12 MED ORDER — DIPHENHYDRAMINE HCL 50 MG/ML IJ SOLN: 12.5000 mg | Freq: Four times a day (QID) | INTRAMUSCULAR | Status: DC | PRN

## 2012-08-12 MED ORDER — HYDROMORPHONE 0.3 MG/ML IV SOLN
INTRAVENOUS | Status: DC
  Administered 2012-08-12: 2 mg via INTRAVENOUS
  Administered 2012-08-12: 1.8 mg via INTRAVENOUS
  Administered 2012-08-12 (×2): 1.5 mg via INTRAVENOUS
  Administered 2012-08-12: 09:00:00 via INTRAVENOUS
  Administered 2012-08-13: 1.5 mg via INTRAVENOUS
  Administered 2012-08-13: 1.2 mg via INTRAVENOUS
  Administered 2012-08-13: 3 mg via INTRAVENOUS
  Administered 2012-08-13: 1.8 mg via INTRAVENOUS
  Administered 2012-08-13: 0.9 mg via INTRAVENOUS
  Administered 2012-08-13: 15:00:00 via INTRAVENOUS
  Administered 2012-08-14: 1.5 mg via INTRAVENOUS
  Administered 2012-08-14: 0.6 mg via INTRAVENOUS
  Administered 2012-08-14: 0.3 mg via INTRAVENOUS
  Administered 2012-08-14: 1.5 mg via INTRAVENOUS
  Administered 2012-08-15: 0.3 mg via INTRAVENOUS
  Administered 2012-08-15: 0.9 mg via INTRAVENOUS
  Administered 2012-08-15: 1.2 mg via INTRAVENOUS
  Administered 2012-08-15: 1.5 mg via INTRAVENOUS
  Administered 2012-08-15: 2.7 mg via INTRAVENOUS
  Administered 2012-08-15: 11:00:00 via INTRAVENOUS
  Administered 2012-08-16 (×2): 0.9 mg via INTRAVENOUS
  Administered 2012-08-16: 0.6 mg via INTRAVENOUS
  Filled 2012-08-12 (×4): qty 25

## 2012-08-12 MED ORDER — ONDANSETRON HCL 4 MG/2ML IJ SOLN
4.0000 mg | Freq: Four times a day (QID) | INTRAMUSCULAR | Status: DC | PRN
  Administered 2012-08-14 – 2012-08-15 (×2): 4 mg via INTRAVENOUS
  Filled 2012-08-12 (×2): qty 2

## 2012-08-12 MED ORDER — BIOTENE DRY MOUTH MT LIQD
15.0000 mL | Freq: Two times a day (BID) | OROMUCOSAL | Status: DC
  Administered 2012-08-13 – 2012-08-15 (×4): 15 mL via OROMUCOSAL

## 2012-08-12 MED ORDER — SODIUM CHLORIDE 0.9 % IJ SOLN: 10.0000 mL | INTRAMUSCULAR | Status: DC | PRN

## 2012-08-12 MED ORDER — NALOXONE HCL 0.4 MG/ML IJ SOLN: 0.4000 mg | INTRAMUSCULAR | Status: DC | PRN

## 2012-08-12 MED ORDER — FENTANYL 10 MCG/ML IV SOLN
INTRAVENOUS | Status: DC
  Filled 2012-08-12: qty 50

## 2012-08-12 MED ORDER — DIPHENHYDRAMINE HCL 12.5 MG/5ML PO ELIX
12.5000 mg | ORAL_SOLUTION | Freq: Four times a day (QID) | ORAL | Status: DC | PRN
  Filled 2012-08-12: qty 5

## 2012-08-12 MED ORDER — CHLORHEXIDINE GLUCONATE 0.12 % MT SOLN
15.0000 mL | Freq: Two times a day (BID) | OROMUCOSAL | Status: DC
  Administered 2012-08-12 – 2012-08-16 (×6): 15 mL via OROMUCOSAL
  Filled 2012-08-12 (×6): qty 15

## 2012-08-12 MED ORDER — ONDANSETRON HCL 4 MG/2ML IJ SOLN
4.0000 mg | Freq: Three times a day (TID) | INTRAMUSCULAR | Status: DC | PRN
  Administered 2012-08-12: 4 mg via INTRAVENOUS

## 2012-08-12 MED ORDER — SODIUM CHLORIDE 0.9 % IJ SOLN
10.0000 mL | Freq: Two times a day (BID) | INTRAMUSCULAR | Status: DC
  Administered 2012-08-12: 10 mL

## 2012-08-12 MED ORDER — SODIUM CHLORIDE 0.9 % IJ SOLN: 9.0000 mL | INTRAMUSCULAR | Status: DC | PRN

## 2012-08-12 MED ORDER — SODIUM CHLORIDE 0.9 % IJ SOLN
9.0000 mL | INTRAMUSCULAR | Status: DC | PRN
  Administered 2012-08-12: 9 mL via INTRAVENOUS

## 2012-08-12 MED ORDER — ONDANSETRON HCL 4 MG/2ML IJ SOLN
INTRAMUSCULAR | Status: AC
  Administered 2012-08-12: 4 mg via INTRAVENOUS
  Filled 2012-08-12: qty 2

## 2012-08-12 MED ORDER — THIAMINE HCL 100 MG/ML IJ SOLN
INTRAMUSCULAR | Status: AC
  Administered 2012-08-12: 17:00:00 via INTRAVENOUS
  Filled 2012-08-12: qty 2000

## 2012-08-12 NOTE — Progress Notes (Signed)
eLink Physician-Brief Progress Note Patient Name: [name] DOB: 02/10/1961 MRN: [id number]  Date of Service  08/12/2012   HPI/Events of Note  Nausea despite NGT in place.  Has oral phenergan ordered    eICU Interventions  Plan: IV zofran ordered prn   Intervention Category Minor Interventions: Routine modifications to care plan (e.g. PRN medications for pain, fever)  DETERDING,ELIZABETH 08/12/2012, 4:51 AM

## 2012-08-12 NOTE — Progress Notes (Signed)
Pt transferred to 6N24 via wheelchair on 2L [place].  Pt received by RN and stable.

## 2012-08-12 NOTE — Progress Notes (Signed)
Peripherally Inserted Central Catheter/Midline Placement  The IV Nurse has discussed with the patient and/or persons authorized to consent for the patient, the purpose of this procedure and the potential benefits and risks involved with this procedure.  The benefits include less needle sticks, lab draws from the catheter and patient may be discharged home with the catheter.  Risks include, but not limited to, infection, bleeding, blood clot (thrombus formation), and puncture of an artery; nerve damage and irregular heat beat.  Alternatives to this procedure were also discussed.  PICC/Midline Placement Documentation  PICC / Midline Double Lumen 08/12/12 PICC Right Basilic (Active)       [name] 08/12/2012, 11:29 AM

## 2012-08-12 NOTE — Progress Notes (Signed)
[facility]S/Marijke [facility] Progress Note 2 Days Post-Op  Subjective: The patient is doing okay.  Extubated for 24 hours.  Complaining of pain.  Objective: Vital signs in last 24 hours: Temp:  [98 F (36.7 C)-98.6 F (37 C)] 98.6 F (37 C) (01/12 0718) Pulse Rate:  [62-110] 87  (01/12 0730) Resp:  [12-32] 15  (01/12 0730) BP: (120-172)/(65-82) 138/74 mmHg (01/12 0700) SpO2:  [95 %-100 %] 99 % (01/12 0730) Arterial Line BP: (150-173)/(52-70) 171/57 mmHg (01/11 1500) FiO2 (%):  [39.9 %-40.2 %] 39.9 % (01/11 1200) Weight:  [78.6 kg (173 lb 4.5 oz)] 78.6 kg (173 lb 4.5 oz) (01/12 0500)    Intake/Output from previous day: 01/11 0701 - 01/12 0700 In: 1500 [I.V.:1350; NG/GT:150] Out: 2880 [Urine:2085; Emesis/NG output:50; Drains:745] Intake/Output this shift: Total I/O In: 30 [NG/GT:30] Out: 70 [Drains:70]  General: Moaning in distress.  Lungs: Clear  Abd: Firm, guarding.  RUQ drain putting out only serosanguinous fluid, total of 745cc , non bilious.  Extremities: No DVT signs or symptoms.  Neuro: Intact  Lab Results:   Hct 31.9%  WBC 16K. BMET  Basename 08/11/12 0317 08/11/12 0206  NA 142 144  K 4.0 4.0  CL 108 110  CO2 26 26  GLUCOSE 104* 103*  BUN 19 19  CREATININE 0.96 0.98  CALCIUM 8.0* 8.0*   PT/INR  Basename 08/10/12 1530 08/10/12 1246  LABPROT 14.9 16.5*  INR 1.19 1.37   ABG  Basename 08/10/12 1523 08/10/12 1253  PHART 7.372 7.371  HCO3 24.4* 26.4*    Studies/Results: Dg Chest Port 1 View  08/10/2012  *RADIOLOGY REPORT*  Clinical Data: Intubation, evaluate endotracheal tube placement  PORTABLE CHEST - 1 VIEW  Comparison: Portable exam 1515 hours compared to 08/09/2012  Findings: Tip of endotracheal tube 2.6 cm above carina. Nasogastric tube coiled in proximal stomach. Upper normal heart size. Stable mediastinal contours. Left upper lobe scarring. New left lower lobe infiltrate question pneumonia versus aspiration. Mild atelectasis versus early infiltrate at right  base as well. No gross pleural effusion or pneumothorax.  IMPRESSION: Satisfactory endotracheal tube position. New left lower lobe infiltrate with atelectasis versus consolidation at right base as well. Persistent left upper lobe scarring   Original Report Authenticated By: Ulyses Southward, M.D.     Anti-infectives: Anti-infectives    None      Assessment/Plan: s/p Procedure(s): EXPLORATORY LAPAROTOMY d/c foley Advance diet May transfer from the unit Would start PICC line and TPN.   LOS: 3 days   Marta Lamas. Gae Bon, MD, FACS [phone] [facility] Surgery 08/12/2012

## 2012-08-12 NOTE — Progress Notes (Signed)
PULMONARY  / CRITICAL CARE MEDICINE  Name: [name] MRN: [id number] DOB: 1961/05/16    LOS: 3  REFERRING MD :  TRH / GI  CHIEF COMPLAINT:  Acute respiratory failure, shock  BRIEF PATIENT DESCRIPTION: 52 y/o AAM with PMH of GERD, ETOH abuse, ? Cocaine abuse admitted per [facility] on 1/9 with a week hx of  hematemesis, melena.  Underwent EGD on 1/10 with active duodenal bleeding.  Decompensated requiring intubation and emergent surgical consultation for laparotomy.  Returned to ICU on vent.    LINES / TUBES: 1/10 OETT>>>1/11 1/10 R FEM Cordis>>>1/11 1/10 L Rad Aline>>>1/11 1/10 R Abd drain>>>  CULTURES: 1/9 MRSA PCR>>>neg  ANTIBIOTICS: None LEVEL OF CARE:  ICU PRIMARY SERVICE:  TRH-->PCCM CONSULTANTS:  GI, CCS CODE STATUS:  Full Code DIET:  NPO DVT Px:  SCD's GI Px:  Protonix gtt  SIGNIFICANT EVENTS:  1/9 - Admit per [facility] for hematemesis, dark stool 1/10 - EGD with active duodenal bleed, decompensated.  Intubated & to OR for Ex-Lap 1/11 -  Meets extubation criteria. No active bleeding. Hgb up 1/12 - stable from respiratory standpoint, begin PCA for pain, tx out of ICU   INTERVAL HISTORY:   08/12/2012 : Complains of abd pain and nausea despite NGT.  No acute events overnight.      VITAL SIGNS: Temp:  [98 F (36.7 C)-98.6 F (37 C)] 98.6 F (37 C) (01/12 0718) Pulse Rate:  [83-108] 91  (01/12 1100) Resp:  [13-32] 25  (01/12 1100) BP: (120-147)/(65-82) 141/80 mmHg (01/12 1000) SpO2:  [93 %-100 %] 97 % (01/12 1100) Arterial Line BP: (150-173)/(52-60) 171/57 mmHg (01/11 1500) Weight:  [78.6 kg (173 lb 4.5 oz)] 78.6 kg (173 lb 4.5 oz) (01/12 0500)  VENTILATOR SETTINGS:    INTAKE / OUTPUT: Intake/Output      01/11 0701 - 01/12 0700 01/12 0701 - 01/13 0700   I.V. (mL/kg) 1350 (17.2) 200 (2.5)   Blood     NG/GT 150 30   Total Intake(mL/kg) 1500 (19.1) 230 (2.9)   Urine (mL/kg/hr) 2085 (1.1) 385 (0.9)   Emesis/NG output 50 150   Drains 745 120   Blood     Total  Output 2880 655   Net -1380 -425          PHYSICAL EXAMINATION: General:  AAM on vent Neuro:  AAOx4, speech clear, MAE HEENT:  Mm pink/moist, OETT Cardiovascular:  s1s2 rrr, no m/r/g Lungs:  resp's even/non-labored, lungs bilaterally coarse, no wheeze.  Hoarse voice Abdomen:  Round / tight, midline incision c/d/i, JP x1 , bsx4 hypoactive, NGT in place Musculoskeletal:  No acute deformities Skin:  Warm/dry, no edema  LABS: Cbc  Lab 08/12/12 0420 08/11/12 2019 08/11/12 1049  WBC 16.0* -- --  HGB 10.8* 10.6* 10.0*  HCT 31.9* 31.5* 28.8*  PLT 132* 120* 104*   Chemistry  Lab 08/12/12 0420 08/11/12 0317 08/11/12 0206 08/10/12 1530  NA -- 142 144 144  K -- 4.0 4.0 3.7  CL -- 108 110 110  CO2 -- 26 26 24   BUN -- 19 19 23   CREATININE -- 0.96 0.98 0.83  CALCIUM -- 8.0* 8.0* 7.6*  MG 2.1 -- 1.6 --  PHOS 3.6 -- -- --  GLUCOSE -- 104* 103* 154*   Liver fxn  Lab 08/11/12 0317 08/11/12 0206 08/09/12 1356  AST 30 29 13   ALT 23 24 7   ALKPHOS 45 46 36*  BILITOT 0.6 0.6 0.1*  PROT 4.8* 4.8* 4.3*  ALBUMIN 2.4* 2.4*  2.1*   coags  Lab 08/10/12 1530 08/10/12 1246 08/09/12 1634  APTT -- 31 --  INR 1.19 1.37 1.35   Sepsis markers  Lab 08/12/12 0420 08/11/12 1315  LATICACIDVEN -- --  PROCALCITON 0.15 0.15    Cardiac markers  Lab 08/11/12 2020 08/11/12 0930 08/11/12 0317  CKTOTAL -- -- --  CKMB -- -- --  TROPONINI <0.30 <0.30 <0.30   ABG  Lab 08/10/12 1523 08/10/12 1253 08/10/12 1149  PHART 7.372 7.371 7.254*  PCO2ART 42.1 44.8 54.5*  PO2ART 213.0* 451.0* 399.0*  HCO3 24.4* 26.4* 24.5*  TCO2 26 28 26    CBG trend  Lab 08/10/12 1735  GLUCAP 124*    IMAGING:  Dg Chest Port 1 View  08/10/2012  *RADIOLOGY REPORT*  Clinical Data: Intubation, evaluate endotracheal tube placement  PORTABLE CHEST - 1 VIEW  Comparison: Portable exam 1515 hours compared to 08/09/2012  Findings: Tip of endotracheal tube 2.6 cm above carina. Nasogastric tube coiled in proximal stomach.  Upper normal heart size. Stable mediastinal contours. Left upper lobe scarring. New left lower lobe infiltrate question pneumonia versus aspiration. Mild atelectasis versus early infiltrate at right base as well. No gross pleural effusion or pneumothorax.  IMPRESSION: Satisfactory endotracheal tube position. New left lower lobe infiltrate with atelectasis versus consolidation at right base as well. Persistent left upper lobe scarring   Original Report Authenticated By: Ulyses Southward, M.D.     DIAGNOSES: Principal Problem:  *Status post laparotomy with oversew of bleeding duodenal artery Active Problems:  Anemia due to GI blood loss  Acute respiratory failure  Hypertension  Duodenal ulcer  Alcohol abuse  Encephalopathy acute   ASSESSMENT / PLAN:  PULMONARY  ASSESSMENT: Acute respiratory failure - in setting of ex-lap, duodenal bleeding ? Aspiration Event - vomiting / bleeding during intubation  PLAN:   -aggressive pulmonary hygiene; hold off abx   CARDIOVASCULAR   ASSESSMENT:  Hypertension  Hx of Cocaine Abuse - UDS neg for cocaine on admit 1/9 Hypotension - Resolved, occurred during active bleed prior to surgery  PLAN:  -consider restart BP medications   RENAL  ASSESSMENT:   No known Hx.    PLAN:   -monitor  GASTROINTESTINAL  ASSESSMENT:   Hematemesis  - in setting of duodenal bleed S/P Exploratory Laparotomy 1/10 -   PLAN:   -post-op care per CCS -IV protonix gtt -NPO -NGT / feedings per CCS  HEMATOLOGIC  ASSESSMENT:   Acute Blood Loss Anemia  - pn 08/12/2012: NO active bleeding   PLAN:  -now CBC with Diff, repeat at MN and in am -transfusion Hgb <7 % even with bleeding unless hemodynamically unstable than prbc per bp/hr  INFECTIOUS  ASSESSMENT:   Rule out Aspiration PNA - ? Aspiration event during intubation with vomiting / bleeding  PLAN:   -hold abx for now  ENDOCRINE  ASSESSMENT:   No hx of DM  PLAN:   CBG Q6 while  NPO  NEUROLOGIC  ASSESSMENT:   Acute Encephalopathy - in setting of acute blood loss anemia, post op laparotomy Hx of Polysubstance Abuse   PLAN:   -will need to monitor for DT's closely once sedation off / extubated - dilaudid PCA per CCS   Patient with stable hemodynamics and respiratory status.  Will transfer out of ICU to med tele and back to [facility] in am 11/13 0700.  PCCM will sign off.     Canary Brim, NP-C [place] [facility] Pgr: [phone] or [phone]    08/12/2012 12:28  PM    STAFF NOTE: I, Dr Lavinia Sharps have personally reviewed patient's available data, including medical history, events of note, physical examination and test results as part of my evaluation. I have discussed with resident/NP and other care providers such as pharmacist, RN and RRT.  In addition,  I personally evaluated patient and elicited key findings of gi bleeding s/p lap and s/p suture of ulcer. He is hemodynamically stable and no active bleeding. Can move out of ICU. [facility] pick up tomorrow. Active issues are a) surgical pain - dialudid PCA per CCS; b) monitor for DTs; c)  atlectasis lung post aspiration; wathching off antibiotics.  Rest per NP/medical resident whose note is outlined above and that I agree with    Dr. Kalman Shan, M.D., [facility].C.P Pulmonary and Critical Care Medicine Staff Physician [place] [facility] [place] [facility] Pager: [phone], If no answer or between  15:00h - 7:00h: call [phone]  08/12/2012 12:28 PM

## 2012-08-12 NOTE — Progress Notes (Signed)
PARENTERAL NUTRITION CONSULT NOTE - INITIAL  Pharmacy Consult for TPN Indication: Bleeding duodenal ulcer  No Known Allergies  Patient Measurements: Height: 5\' 9"  (175.3 cm) Weight: 173 lb 4.5 oz (78.6 kg) IBW/kg (Calculated) : 70.7   Vital Signs: Temp: 98.6 F (37 C) (01/12 0718) Temp src: Oral (01/12 0718) BP: 141/80 mmHg (01/12 1000) Pulse Rate: 84  (01/12 1000) Intake/Output from previous day: 01/11 0701 - 01/12 0700 In: 1500 [I.V.:1350; NG/GT:150] Out: 2880 [Urine:2085; Emesis/NG output:50; Drains:745] Intake/Output from this shift: Total I/O In: 180 [I.V.:150; NG/GT:30] Out: 545 [Urine:325; Emesis/NG output:150; Drains:70]  Labs:  [facility] 08/12/12 0420 08/11/12 2019 08/11/12 1049 08/10/12 1530 08/10/12 1246 08/09/12 1634  WBC 16.0* 16.1* 17.5* -- -- --  HGB 10.8* 10.6* 10.0* -- -- --  HCT 31.9* 31.5* 28.8* -- -- --  PLT 132* 120* 104* -- -- --  APTT -- -- -- -- 31 --  INR -- -- -- 1.19 1.37 1.35     Basename 08/12/12 0420 08/11/12 0317 08/11/12 0206 08/10/12 1530 08/09/12 1356  NA -- 142 144 144 --  K -- 4.0 4.0 3.7 --  CL -- 108 110 110 --  CO2 -- 26 26 24  --  GLUCOSE -- 104* 103* 154* --  BUN -- 19 19 23  --  CREATININE -- 0.96 0.98 0.83 --  LABCREA -- -- -- -- --  CREAT24HRUR -- -- -- -- --  CALCIUM -- 8.0* 8.0* 7.6* --  MG 2.1 -- 1.6 -- --  PHOS 3.6 -- -- -- --  PROT -- 4.8* 4.8* -- 4.3*  ALBUMIN -- 2.4* 2.4* -- 2.1*  AST -- 30 29 -- 13  ALT -- 23 24 -- 7  ALKPHOS -- 45 46 -- 36*  BILITOT -- 0.6 0.6 -- 0.1*  BILIDIR -- -- -- -- <0.1  IBILI -- -- -- -- NOT CALCULATED  PREALBUMIN -- -- -- -- --  TRIG -- -- -- -- --  CHOLHDL -- -- -- -- --  CHOL -- -- -- -- --   Estimated Creatinine Clearance: 90 ml/min (by C-G formula based on Cr of 0.96).    Basename 08/10/12 1735  GLUCAP 124*    Medical History: Past Medical History  Diagnosis Date  . Abdominal  pain, other specified site   . GERD (gastroesophageal reflux disease)   . Abnormal CT  scan, esophagus 03/2012  . Substance abuse 03/2012    tox screen positive for cocaine.     Medications:  No prescriptions prior to admission    Insulin Requirements in the past 24 hours:  No insulin on board  Current Nutrition:  NPO  Nutritional Goals per RD:  1650-1750 kCal, 110-120 grams of protein per day  Assessment:  Admit: [name] was admitted with hematemesis and Hgb of 5 on admit. He underwent EGD which revealed active bleeding duodenal artery from a duodenal ulcer, which could not be controlled endoscopically. Patient is now s/p urgent surgical repair of his bleeding duodenal ulcer (1/10).   GI: NGT in place, suctioning out bloody output- 150cc in last 24h. No BM reported, patient is NPO and complains of abd pain and nausea. Noted he is on a PPI drip (started ~ 64 hrs ago). Noted PO MVI ordered. Moderate refeeding risk as pt has poor baseline nutritional status and has been NPO for ~ 5 days. Endo: No prior hx of DM or other endocrine disorders. Not currently on insulin supplementation. LytesJudieth Keens were nml on CMET 1/11. Corr Ca 9.3. On  NS at 50cc/hr. Renal: Scr nml, UOP is good at 1.1 ml/kg/hr. Pulm: Extubated ~24 h ago, off propofol (had received ~ 3 days worth) Cards: VSS Hepatobil: Alb low at 2.4, LFTs otherwise nml. Neuro: WNL, conversant, but sleepy ID: Afeb, WBC elevated at 16, PCT 0.15. No current abx, received Ancef perioperatively. Best Practices: SCDs, IV PPI gtt TPN Access: PICC pending (spoke to IV team, this will be done today) TPN day#: 1  Plan:  - Once PICC placed, start TPN with Clinimix E 5/15 at 50cc/hr (goal is 100 cc/hr). Will titrate slowly depending on patient tolerance to ultimately meet RD recommended goals. - Will supplement MVI, trace elements and IV fats on MWF due to ongoing national shortages. Will d/c PO MVI and folate (? Absorption) for now, and supplement thiamine and folate via TPN.  - Will start sensitive scale SSI when TPN starts  tonight, will plan to d/c SSI if glycemia is well controlled on goal TPN - Continue NS at 50cc/hr for now, will plan to decrease this as TPN rate increases. - Will f/up TPN labs in AM  Thanks, Katrena Stehlin K. Allena Katz, PharmD, BCPS.  Clinical Pharmacist Pager [phone]. 08/12/2012 10:30 AM

## 2012-08-13 ENCOUNTER — Encounter (HOSPITAL_COMMUNITY): Payer: Self-pay | Admitting: Gastroenterology

## 2012-08-13 ENCOUNTER — Inpatient Hospital Stay (HOSPITAL_COMMUNITY): Payer: Medicaid Other

## 2012-08-13 DIAGNOSIS — I10 Essential (primary) hypertension: Secondary | ICD-10-CM

## 2012-08-13 DIAGNOSIS — D62 Acute posthemorrhagic anemia: Secondary | ICD-10-CM

## 2012-08-13 LAB — TYPE AND SCREEN
Unit division: 0
Unit division: 0
Unit division: 0
Unit division: 0
Unit division: 0
Unit division: 0
Unit division: 0
Unit division: 0
Unit division: 0
Unit division: 0
Unit division: 0
Unit division: 0
Unit division: 0
Unit division: 0

## 2012-08-13 LAB — CBC
HCT: 27.9 % — ABNORMAL LOW (ref 39.0–52.0)
RDW: 14.9 % (ref 11.5–15.5)
WBC: 14.3 10*3/uL — ABNORMAL HIGH (ref 4.0–10.5)

## 2012-08-13 LAB — COMPREHENSIVE METABOLIC PANEL
AST: 22 U/L (ref 0–37)
Albumin: 2.2 g/dL — ABNORMAL LOW (ref 3.5–5.2)
CO2: 32 mEq/L (ref 19–32)
Calcium: 8.4 mg/dL (ref 8.4–10.5)
Creatinine, Ser: 0.77 mg/dL (ref 0.50–1.35)
GFR calc non Af Amer: >90 mL/min (ref >90)
Total Protein: 5.2 g/dL — ABNORMAL LOW (ref 6.0–8.3)

## 2012-08-13 LAB — GLUCOSE, CAPILLARY
Glucose-Capillary: 114 mg/dL — ABNORMAL HIGH (ref 70–99)
Glucose-Capillary: 112 mg/dL — ABNORMAL HIGH (ref 70–99)

## 2012-08-13 LAB — DIFFERENTIAL
Basophils Absolute: 0 10*3/uL (ref 0.0–0.1)
Lymphocytes Relative: 7 % — ABNORMAL LOW (ref 12–46)
Monocytes Absolute: 1 10*3/uL (ref 0.1–1.0)
Neutro Abs: 12.3 10*3/uL — ABNORMAL HIGH (ref 1.7–7.7)

## 2012-08-13 LAB — PHOSPHORUS: Phosphorus: 2.6 mg/dL (ref 2.3–4.6)

## 2012-08-13 LAB — CHOLESTEROL, TOTAL: Cholesterol: 106 mg/dL (ref 0–200)

## 2012-08-13 LAB — PREALBUMIN: Prealbumin: 10.1 mg/dL — ABNORMAL LOW (ref 17.0–34.0)

## 2012-08-13 LAB — TRIGLYCERIDES: Triglycerides: 54 mg/dL (ref <150)

## 2012-08-13 MED ORDER — FAT EMULSION 20 % IV EMUL
250.0000 mL | INTRAVENOUS | Status: AC
  Administered 2012-08-13: 250 mL via INTRAVENOUS
  Filled 2012-08-13: qty 250

## 2012-08-13 MED ORDER — HYDRALAZINE HCL 20 MG/ML IJ SOLN
5.0000 mg | INTRAMUSCULAR | Status: DC | PRN
  Administered 2012-08-14: 10 mg via INTRAVENOUS
  Filled 2012-08-13 (×2): qty 0.5

## 2012-08-13 MED ORDER — LORAZEPAM 1 MG PO TABS: 1.0000 mg | ORAL_TABLET | Freq: Four times a day (QID) | ORAL | Status: AC | PRN

## 2012-08-13 MED ORDER — LORAZEPAM 2 MG/ML IJ SOLN
1.0000 mg | Freq: Four times a day (QID) | INTRAMUSCULAR | Status: AC | PRN
  Administered 2012-08-13 – 2012-08-16 (×3): 1 mg via INTRAVENOUS
  Filled 2012-08-13 (×5): qty 1

## 2012-08-13 MED ORDER — SODIUM CHLORIDE 0.9 % IJ SOLN
10.0000 mL | INTRAMUSCULAR | Status: DC | PRN
  Administered 2012-08-13 – 2012-08-20 (×7): 10 mL

## 2012-08-13 MED ORDER — TRACE MINERALS CR-CU-F-FE-I-MN-MO-SE-ZN IV SOLN
INTRAVENOUS | Status: AC
  Administered 2012-08-13: 18:00:00 via INTRAVENOUS
  Filled 2012-08-13: qty 2400

## 2012-08-13 NOTE — Evaluation (Addendum)
Physical Therapy Evaluation Patient Details Name: [name] MRN: [id number] DOB: 17-Aug-1960 Today's Date: 08/13/2012 Time: 4098-1191 PT Time Calculation (min): 24 min  PT Assessment / Plan / Recommendation Clinical Impression  Patient is 52 y.o. male s/p laparotomy with deficits in functional mobility secondary to pain, poor activity tolerance and fatigue.  Patient ambulated 2 trials of 80 feet with breaks need for rest secondary to increased dizziness with ambulation. Patient placed on 2 liters O2 for ambulation with increased tolerance for activity and resolved dizziness. Will continue to monitor progress and increase activity as tolerated.    PT Assessment  Patient needs continued PT services    Follow Up Recommendations   NO PT follow up    Does the patient have the potential to tolerate intense rehabilitation      Barriers to Discharge Decreased caregiver support lives with a friend unsure if friend can provide support    Equipment Recommendations       Recommendations for Other Services     Frequency Min 3X/week    Precautions / Restrictions     Pertinent Vitals/Pain 6/10      Mobility  Bed Mobility Bed Mobility: Not assessed Transfers Transfers: Sit to Stand;Stand to Sit Sit to Stand: 4: Min assist;4: Min guard;From chair/3-in-1 (from rolling chair; two trials from recliner) Stand to Sit: 4: Min assist;4: Min guard;To chair/3-in-1 Details for Transfer Assistance: Patient steady but limited by pain and not feeling well;  Previous bouts of dizziness with standing, resolved Ambulation/Gait Ambulation/Gait Assistance: 4: Min guard Ambulation Distance (Feet): 80 Feet Assistive device: None Ambulation/Gait Assistance Details: assist to maintain O2 tank on 2 liters, patient saturation ranging from 97 to 91 on 2 liters with ambulation Gait Pattern: Step-through pattern;Decreased stride length;Trunk flexed Gait velocity: decreased General Gait Details: patient very  rigid and max cues for upright posture Stairs: No           PT Diagnosis: Difficulty walking;Acute pain;Generalized weakness  PT Problem List: Decreased strength;Decreased activity tolerance;Pain;Decreased mobility PT Treatment Interventions: Gait training;Functional mobility training;Therapeutic activities;Patient/family education   PT Goals Acute Rehab PT Goals PT Goal Formulation: With patient Time For Goal Achievement: 08/20/12 Potential to Achieve Goals: Good Pt will go Sit to Stand: Independently PT Goal: Sit to Stand - Progress: Goal set today Pt will go Stand to Sit: Independently PT Goal: Stand to Sit - Progress: Goal set today Pt will Ambulate: >150 feet;Independently PT Goal: Ambulate - Progress: Goal set today  Visit Information  Last PT Received On: 08/13/12 Assistance Needed: +1    Subjective Data  Subjective: I was trying to walk with nsg but felt dizzy Patient Stated Goal: to go home   Prior Functioning  Home Living Lives With: Friend(s) Available Help at Discharge: Available PRN/intermittently Type of Home: House Home Access: Level entry Home Layout: One level Bathroom Shower/Tub: Health visitor: Standard Home Adaptive Equipment: None Prior Function Level of Independence: Independent Able to Take Stairs?: Yes Driving: No Vocation: Unemployed Communication Communication: No difficulties Dominant Hand: Right    Cognition  Overall Cognitive Status: Appears within functional limits for tasks assessed/performed Arousal/Alertness: Awake/alert Orientation Level: Appears intact for tasks assessed Behavior During Session: [facility] for tasks performed    Extremity/Trunk Assessment Right Upper Extremity Assessment RUE ROM/Strength/Tone: [facility] for tasks assessed Left Upper Extremity Assessment LUE ROM/Strength/Tone: [facility] for tasks assessed Right Lower Extremity Assessment RLE ROM/Strength/Tone: [facility] for tasks assessed Left Lower Extremity  Assessment LLE ROM/Strength/Tone: [facility] for tasks assessed   Balance  End of Session PT - End of Session Equipment Utilized During Treatment: Gait belt;Oxygen Activity Tolerance: Patient limited by fatigue;Patient limited by pain Patient left: in chair;with call bell/phone within reach Nurse Communication: Mobility status  GP     [name] 08/13/2012, 12:57 PM  Charlotte Crumb, PT DPT  [phone]

## 2012-08-13 NOTE — Progress Notes (Signed)
PROGRESS NOTE  [name] ZOX:[id number] DOB: 1961/05/06 DOA: 08/09/2012 PCP: Default, Provider, MD  Brief narrative: This 52 year old African American male was admitted 08/09/2012. He complained of a one-week history of continuous nausea vomiting and emesis that suddenly became bloody. This is in setting of drinking 40 ounces daily of alcohol. He also describe darkening of her stool that period of time. He was admitted to step down unit and during the endoscopic procedure on 1.10 was still bleeding from a large post duodenal ulcer with blood pressure in the 70s, and an attempt was made by gastroenterologist to clip the area x2. Because of hemodynamic instability he was intubated. He went the OR emergently and had Exploratory Laprarotomy He was transfused 3 units of blood and 1 unit of FFP He was extubated on 1.11 and thought to be stable on 1.12 qand transferred back to [facility] to help coordinate medical management with Gen surgery  Past medical history-As per Problem lis  Consultants:  PCCM-signed off 11.13  GI-Singed off 1.10  Gen surgery  Procedures:  EGD 08/10/2012  Intubated 1.10  Extubated 1.11  Exploratory laparotomy 08/09/2012-JP drain R side placed 1/10, NG tube in situ 1/10  Antibiotics:  None currently   Subjective  Alert oriented and calm does not seem to be in much pain.. Still no flatus, no stool. NG putting out dark appearing blood   Objective    Interim History: No other issues  Telemetry: None telemetry at present  Objective: Filed Vitals:   08/12/12 2120 08/13/12 0209 08/13/12 0321 08/13/12 0441  BP: 153/80 141/80  164/89  Pulse: 103 87  99  Temp: 99.6 F (37.6 C) 98.6 F (37 C)  99.3 F (37.4 C)  TempSrc: Oral Oral  Oral  Resp: 21 19 16 18   Height:      Weight:      SpO2: 94% 98% 96% 98%    Intake/Output Summary (Last 24 hours) at 08/13/12 0854 Last data filed at 08/13/12 8119  Gross per 24 hour  Intake   1109 ml  Output    1740 ml  Net   -631 ml    Exam:  General: Alert pleasant oriented African American male, no specific distress Cardiovascular: S1-S2 slightly tachycardic Respiratory: Clinically clear no added sound Abdomen: Bowel slightly firm, is dressing soaked through on the right side, bowel sounds hypoactive, mild tenderness Skin lower extremity soft Neuro intact  Data Reviewed: Basic Metabolic Panel:  Lab 08/13/12 1478 08/12/12 0420 08/11/12 0317 08/11/12 0206 08/10/12 1530 08/10/12 1253 08/09/12 1356  NA 140 -- 142 144 144 142 --  K 3.7 -- 4.0 -- -- -- --  CL 102 -- 108 110 110 -- 103  CO2 32 -- 26 26 24  -- 28  GLUCOSE 120* -- 104* 103* 154* -- 133*  BUN 16 -- 19 19 23  -- 31*  CREATININE 0.77 -- 0.96 0.98 0.83 -- 1.33  CALCIUM 8.4 -- 8.0* 8.0* 7.6* -- 7.7*  MG 2.0 2.1 -- 1.6 -- -- --  PHOS 2.6 3.6 -- -- -- -- --   Liver Function Tests:  Lab 08/13/12 0640 08/11/12 0317 08/11/12 0206 08/09/12 1356  AST 22 30 29 13   ALT 17 23 24 7   ALKPHOS 55 45 46 36*  BILITOT 0.5 0.6 0.6 0.1*  PROT 5.2* 4.8* 4.8* 4.3*  ALBUMIN 2.2* 2.4* 2.4* 2.1*    Lab 08/09/12 1356  LIPASE 31  AMYLASE --   No results found for this basename: AMMONIA:5 in the last 168  hours CBC:  Lab 08/13/12 0640 08/12/12 2000 08/12/12 1200 08/12/12 0420 08/11/12 2019 08/10/12 1530 08/09/12 1356  WBC 14.3* 14.6* 15.2* 16.0* 16.1* -- --  NEUTROABS 12.3* -- -- -- -- 12.8* 10.2*  HGB 9.5* 9.6* 9.9* 10.8* 10.6* -- --  HCT 27.9* 28.2* 29.0* 31.9* 31.5* -- --  MCV 86.9 87.3 87.1 87.2 85.6 -- --  PLT 133* 123* 134* 132* 120* -- --   Cardiac Enzymes:  Lab 08/11/12 2020 08/11/12 0930 08/11/12 0317 08/10/12 1534 08/09/12 1402  CKTOTAL -- -- -- -- --  CKMB -- -- -- -- --  CKMBINDEX -- -- -- -- --  TROPONINI <0.30 <0.30 <0.30 <0.30 <0.30   BNP: No components found with this basename: POCBNP:5 CBG:  Lab 08/13/12 0807 08/13/12 0436 08/12/12 2358 08/12/12 2028 08/10/12 1735  GLUCAP 110* 121* 120* 112* 124*    Recent  Results (from the past 240 hour(s))  MRSA PCR SCREENING     Status: Normal   Collection Time   08/09/12  7:26 PM      Component Value Range Status Comment   MRSA by PCR NEGATIVE  NEGATIVE Final      Studies:              All Imaging reviewed and is as per above notation   Scheduled Meds:   . albuterol  2.5 mg Nebulization Q6H  . antiseptic oral rinse  15 mL Mouth Rinse q12n4p  . chlorhexidine  15 mL Mouth Rinse BID  . HYDROmorphone PCA 0.3 mg/mL   Intravenous Q4H  . insulin aspart  0-9 Units Subcutaneous Q4H  . ipratropium  0.5 mg Nebulization Q6H   Continuous Infusions:   . sodium chloride 50 mL/hr at 08/12/12 1100  . pantoprozole (PROTONIX) infusion 8 mg/hr (08/13/12 0319)  . TPN (CLINIMIX) +/- additives 50 mL/hr at 08/12/12 1726     Assessment/Plan: 1. Acute resp failure-?Aspiration-CXR 1/13 shows only atelectasis-Unsure what event prompted this-probable sedation with Endoscopy in an already hemodynamically tenuous patient-No current role for Abx coverage at present-suspect white count and low grade temps post-surgical 2. Acute upper GI bleed from posteriorDuodenal ulcer status post Exploratory Laparotomy 1/10-doing stable. Hemoglobin stable at present. Continue protonic Gtt 8 mg per as per surgery. Continue TPN/Clinimix as per surgery 50 cc per hour. Pain management with PCA dilaudid and the decision to feed to be determined as the patient progress-? Clamping trial planned for 08/13/12. Continue IV fluids is normal saline 50 cc per hour till feeds-JP Drain has put out about 230 cc overnight 1/12, NGT ~150 cc 3. FEN-Continue TPN per PICC line placed 08/12/12-CBG q4 hourly-monitor and cover c insulin if prn-sugars 110-121 4. Acute blood loss anemia-hemoglobin has been stable her last 3 checks-will change to every 24 hours. 5. Hypertension-unclear if this is because of pain. Patient is on hydralazine 10-40 mg every 4 when necessary which are changed to 5-10 mg IV every 4 when necessary.   If he persistently hypertensive despite lack pain, could add calcium. blocker amlodipine 5-10 mg when able to take PO meds 6. H/o ETOH abuse-CIWA protocol added.  Not being measured, but suspect would have clinically withdrawn over 3-4 days in hospital; without alcohol   Code Status: Full Family Communication: none at bedside Disposition Plan: Per general surgery-I have requested Gen surgery PA Ms Dort to assess patient with Attending MD when seen to take over management-happy to follow on as consultant if Gen surg feels need   Pleas Koch, MD  [facility] Pager [phone] 08/13/2012, 8:54 AM    LOS: 4 days

## 2012-08-13 NOTE — Anesthesia Postprocedure Evaluation (Signed)
  Anesthesia Post-op Note  Patient: [name]  Procedure(s) Performed: Procedure(s) (LRB) with comments: EXPLORATORY LAPAROTOMY (N/A)  Patient Location: PACU and Nursing Unit  Anesthesia Type:General  Level of Consciousness: awake, alert , oriented and patient cooperative  Airway and Oxygen Therapy: Patient Spontanous Breathing and Patient connected to nasal cannula oxygen  Post-op Pain: mild  Post-op Assessment: Post-op Vital signs reviewed, Patient's Cardiovascular Status Stable, Respiratory Function Stable, Patent Airway and No signs of Nausea or vomiting  Post-op Vital Signs: Reviewed and stable  Complications: No apparent anesthesia complications

## 2012-08-13 NOTE — Progress Notes (Signed)
3 Days Post-Op  Subjective: Pt states pain is still severe, pt still nauseated.  Pt denies flatus or BM, urinating on own.  Pt not ambulating yet.  Objective: Vital signs in last 24 hours: Temp:  [97.9 F (36.6 C)-99.6 F (37.6 C)] 99.3 F (37.4 C) (01/13 0441) Pulse Rate:  [84-103] 99  (01/13 0441) Resp:  [14-25] 18  (01/13 0441) BP: (136-164)/(80-89) 164/89 mmHg (01/13 0441) SpO2:  [93 %-100 %] 98 % (01/13 0441)    Intake/Output from previous day: 01/12 0701 - 01/13 0700 In: 1189 [I.V.:619; NG/GT:510] Out: 2065 [Urine:1685; Emesis/NG output:150; Drains:230] Intake/Output this shift: Total I/O In: -  Out: 70 [Drains:70]  PE: Gen:  Alert, NAD, pleasant Abd: Soft, ND, moderate ttp, +BS, no HSM, incisions C/D/I, drain with minimal sanguinous drainage   Lab Results:   Basename 08/13/12 0640 08/12/12 2000  WBC 14.3* 14.6*  HGB 9.5* 9.6*  HCT 27.9* 28.2*  PLT 133* 123*   BMET  Basename 08/13/12 0640 08/11/12 0317  NA 140 142  K 3.7 4.0  CL 102 108  CO2 32 26  GLUCOSE 120* 104*  BUN 16 19  CREATININE 0.77 0.96  CALCIUM 8.4 8.0*   PT/INR  Basename 08/10/12 1530 08/10/12 1246  LABPROT 14.9 16.5*  INR 1.19 1.37   CMP     Component Value Date/Time   NA 140 08/13/2012 0640   K 3.7 08/13/2012 0640   CL 102 08/13/2012 0640   CO2 32 08/13/2012 0640   GLUCOSE 120* 08/13/2012 0640   BUN 16 08/13/2012 0640   CREATININE 0.77 08/13/2012 0640   CALCIUM 8.4 08/13/2012 0640   PROT 5.2* 08/13/2012 0640   ALBUMIN 2.2* 08/13/2012 0640   AST 22 08/13/2012 0640   ALT 17 08/13/2012 0640   ALKPHOS 55 08/13/2012 0640   BILITOT 0.5 08/13/2012 0640   GFRNONAA >90 08/13/2012 0640   GFRAA >90 08/13/2012 0640   Lipase     Component Value Date/Time   LIPASE 31 08/09/2012 1356     Studies/Results: Dg Chest Port 1 View  08/13/2012  *RADIOLOGY REPORT*  Clinical Data: Follow up airspace disease, concern for aspiration 01/10  PORTABLE CHEST - 1 VIEW  Comparison: Prior chest x-ray 08/10/2012   Findings: The patient has been extubated.  Stable position of nasogastric tube and right upper extremity approach PICC.  The tip of the PICC catheter projects over the superior cavoatrial junction.  Stable left apical and medial pleural parenchymal scarring versus fibrosis.  Overall, the inspiratory volumes are low there are linear bibasilar opacities most likely reflecting atelectasis.  New blunting of the right costophrenic angle may reflect a small pleural effusion.  IMPRESSION:  1.  Interval extubation. 2.  Persistent low inspiratory volumes with bibasilar opacities favored to reflect atelectasis. 3.  Likely small right pleural effusion.   Original Report Authenticated By: Malachy Moan, M.D.       Assessment/Plan POD #3 s/p Ex Lap with oversew of bleeding duodenal artery and ulcer -Urinating on own -Cont PICC line and TPN, NG tube for now, may start clamping trials tomorrow ABL Anemia - stable HTN - medicine following Duodenal ulcer  ETOH Abuse - CIWA protocol VTE - SCD's, no lovenox due to bleeding FEN - TPN DISP - Hopefully start progressing diet soon and weaning off TPN, when on PO's        LOS: 4 days    DORT, Bernadette Gores 08/13/2012, 8:30 AM Pager: [phone]

## 2012-08-13 NOTE — Progress Notes (Signed)
PARENTERAL NUTRITION CONSULT NOTE - Follow up  Pharmacy Consult for TPN Indication: Bleeding duodenal ulcer  No Known Allergies  Patient Measurements: Height: 5\' 9"  (175.3 cm) Weight: 173 lb 4.5 oz (78.6 kg) IBW/kg (Calculated) : 70.7   Vital Signs: Temp: 98.9 F (37.2 C) (01/13 0937) Temp src: Oral (01/13 0937) BP: 144/68 mmHg (01/13 0937) Pulse Rate: 99  (01/13 0937) Intake/Output from previous day: 01/12 0701 - 01/13 0700 In: 1189 [I.V.:619; NG/GT:510] Out: 2065 [Urine:1685; Emesis/NG output:150; Drains:230] Intake/Output from this shift: Total I/O In: 0  Out: 70 [Drains:70]  Labs:  [facility] 08/13/12 0640 08/12/12 2000 08/12/12 1200 08/10/12 1530 08/10/12 1246  WBC 14.3* 14.6* 15.2* -- --  HGB 9.5* 9.6* 9.9* -- --  HCT 27.9* 28.2* 29.0* -- --  PLT 133* 123* 134* -- --  APTT -- -- -- -- 31  INR -- -- -- 1.19 1.37     Basename 08/13/12 0640 08/12/12 0420 08/11/12 0317 08/11/12 0206  NA 140 -- 142 144  K 3.7 -- 4.0 4.0  CL 102 -- 108 110  CO2 32 -- 26 26  GLUCOSE 120* -- 104* 103*  BUN 16 -- 19 19  CREATININE 0.77 -- 0.96 0.98  LABCREA -- -- -- --  CREAT24HRUR -- -- -- --  CALCIUM 8.4 -- 8.0* 8.0*  MG 2.0 2.1 -- 1.6  PHOS 2.6 3.6 -- --  PROT 5.2* -- 4.8* 4.8*  ALBUMIN 2.2* -- 2.4* 2.4*  AST 22 -- 30 29  ALT 17 -- 23 24  ALKPHOS 55 -- 45 46  BILITOT 0.5 -- 0.6 0.6  BILIDIR -- -- -- --  IBILI -- -- -- --  PREALBUMIN -- -- -- --  TRIG 54 -- -- --  CHOLHDL -- -- -- --  CHOL 106 -- -- --   Estimated Creatinine Clearance: 108 ml/min (by C-G formula based on Cr of 0.77).    Basename 08/13/12 0807 08/13/12 0436 08/12/12 2358  GLUCAP 110* 121* 120*    Medical History: Past Medical History  Diagnosis Date  . Abdominal  pain, other specified site   . GERD (gastroesophageal reflux disease)   . Abnormal CT scan, esophagus 03/2012  . Substance abuse 03/2012    tox screen positive for cocaine.     Medications:  No prescriptions prior to admission     Insulin Requirements in the past 24 hours:  1 unit Novolog while on sensitive SSI  Current Nutrition:  Clinimix E 5/15 at 63ml/hr  Nutritional Goals per RD:  1650-1750 kCal, 110-120 grams of protein per day  Assessment:  Admit: Mr. Greenleaf was admitted with hematemesis and Hgb of 5 on admit. He underwent EGD which revealed active bleeding duodenal artery from a duodenal ulcer, which could not be controlled endoscopically. Patient is now s/p urgent surgical repair of his bleeding duodenal ulcer (1/10).   GI: NGT in place, suctioning out bloody output- 150cc in last 24h. No BM reported, patient is NPO and complains of abd pain and nausea. Noted he is on a PPI drip  Noted PO MVI ordered. Moderate refeeding risk as pt has poor baseline nutritional status and has been NPO for ~ 6 days. Endo: No prior hx of DM or other endocrine disorders. Not currently on insulin supplementation. Lytes: Lytes were nml . Renal: Scr nml, UOP is good at 1.1 ml/kg/hr. Pulm: 96% sat on ra Cards: VSS; bp 144/68 Hepatobil: Alb low at 2.4, LFTs otherwise nml. Neuro: WNL, conversant, but sleepy ID: Afeb, WBC elevated  at 14, PCT 0.15. No current abx, received Ancef perioperatively. Best Practices: SCDs, IV PPI gtt TPN Access: DL PICC line right basilic TPN day#: 2  Plan:  Increase TPN to 175ml/hr to give slightly above goal for calories. Patient will receive estimated 120g protein and 1909 kcal per day based upon 3 times weekly lipid supplementation. - Will supplement MVI, trace elements and IV fats on MWF due to ongoing Publishing copy. Will d/c PO MVI and folate (? Absorption) for now, and supplement thiamine and folate via TPN.  - Continue sensitive scale SSI  - Decrease IVF to kvo - Will f/up TPN labs in AM  [place], PharmD, [place] Pager [phone] 08/13/2012 9:42 AM

## 2012-08-13 NOTE — Progress Notes (Signed)
NUTRITION FOLLOW UP  Intervention:   TPN per pharmacy. RD to continue to follow nutrition care plan.  Nutrition Dx:   Inadequate oral intake related to inability to eat as evidenced by NPO status. Ongoing.  Goal:   Pt to meet >/= 90% of their estimated nutrition needs; unmet.  Monitor:   TPN adequacy, transition to oral diet vs EN, weights, labs, I/O's  Assessment:   Extubated 1/11. NGT in place presently. Pt has remained NPO since admission x 4 days. TPN pharmacist consulted yesterday for initiation of TPN.  RD obtained brief nutrition hx from pt during this visit. He states that his appetite was adequate PTA and that he typically eats 3 meals daily. States his weight is stable. Did not discuss ETOH intake, however noted pt with hx of 40 oz daily ETOH intake PTA. Pt states he uses foodstamps to purchase foods.  Patient is receiving TPN with Clinimix E 5/15 @ 50 ml/hr. Pt to advance to goal rate tonight of TPN with Clinimix E 5/15 @ 100 ml/hr. Lipids (20% IVFE @ 10 ml/hr), multivitamins, and trace elements are provided 3 times weekly (MWF) due to national backorder.  Goal rate will provide 1909 kcal and 120 grams protein daily (based on weekly average).  Meets 100% minimum estimated kcal and 100% minimum estimated protein needs. Noted pharmacist concern for moderate risk for refeeding syndrome. Current potassium, magnesium and phosphorus WNL.  Height: Ht Readings from Last 1 Encounters:  08/09/12 5\' 9"  (1.753 m)    Weight Status:   Wt Readings from Last 1 Encounters:  08/12/12 173 lb 4.5 oz (78.6 kg)    Re-estimated needs:  Kcal: 1800 - 2100 Protein: 100 - 120 grams Fluid: 1.8 - 2 liters daily  Skin: intact  Diet Order: NPO   Intake/Output Summary (Last 24 hours) at 08/13/12 1205 Last data filed at 08/13/12 0940  Gross per 24 hour  Intake    959 ml  Output   1450 ml  Net   -491 ml    Last BM: unknown   Labs:   Lab 08/13/12 0640 08/12/12 0420 08/11/12 0317  08/11/12 0206  NA 140 -- 142 144  K 3.7 -- 4.0 4.0  CL 102 -- 108 110  CO2 32 -- 26 26  BUN 16 -- 19 19  CREATININE 0.77 -- 0.96 0.98  CALCIUM 8.4 -- 8.0* 8.0*  MG 2.0 2.1 -- 1.6  PHOS 2.6 3.6 -- --  GLUCOSE 120* -- 104* 103*    CBG (last 3)   Basename 08/13/12 0807 08/13/12 0436 08/12/12 2358  GLUCAP 110* 121* 120*    Scheduled Meds:   . antiseptic oral rinse  15 mL Mouth Rinse q12n4p  . chlorhexidine  15 mL Mouth Rinse BID  . HYDROmorphone PCA 0.3 mg/mL   Intravenous Q4H  . insulin aspart  0-9 Units Subcutaneous Q4H    Continuous Infusions:   . sodium chloride 50 mL/hr at 08/12/12 1100  . TPN (CLINIMIX) +/- additives     And  . fat emulsion    . pantoprozole (PROTONIX) infusion 8 mg/hr (08/13/12 0319)  . TPN [facility]) +/- additives 50 mL/hr at 08/12/12 1726    [name] MS, [place], LDN Pager: [phone] After-hours pager: [phone]

## 2012-08-14 ENCOUNTER — Inpatient Hospital Stay (HOSPITAL_COMMUNITY): Payer: Medicaid Other

## 2012-08-14 LAB — CBC
Hemoglobin: 9.4 g/dL — ABNORMAL LOW (ref 13.0–17.0)
MCHC: 34.3 g/dL (ref 30.0–36.0)
Platelets: 140 10*3/uL — ABNORMAL LOW (ref 150–400)
RDW: 14.2 % (ref 11.5–15.5)

## 2012-08-14 LAB — BASIC METABOLIC PANEL
BUN: 13 mg/dL (ref 6–23)
Calcium: 8.5 mg/dL (ref 8.4–10.5)
GFR calc non Af Amer: >90 mL/min (ref >90)
Glucose, Bld: 124 mg/dL — ABNORMAL HIGH (ref 70–99)

## 2012-08-14 LAB — GLUCOSE, CAPILLARY
Glucose-Capillary: 109 mg/dL — ABNORMAL HIGH (ref 70–99)
Glucose-Capillary: 104 mg/dL — ABNORMAL HIGH (ref 70–99)

## 2012-08-14 MED ORDER — POTASSIUM CHLORIDE 10 MEQ/50ML IV SOLN
10.0000 meq | INTRAVENOUS | Status: DC
  Filled 2012-08-14 (×4): qty 50

## 2012-08-14 MED ORDER — CLINIMIX E/DEXTROSE (5/15) 5 % IV SOLN
INTRAVENOUS | Status: AC
  Administered 2012-08-14: 17:00:00 via INTRAVENOUS
  Filled 2012-08-14: qty 2400

## 2012-08-14 MED ORDER — POTASSIUM CHLORIDE 10 MEQ/100ML IV SOLN
10.0000 meq | INTRAVENOUS | Status: AC
  Administered 2012-08-14 (×4): 10 meq via INTRAVENOUS
  Filled 2012-08-14: qty 100
  Filled 2012-08-14: qty 200

## 2012-08-14 MED ORDER — POTASSIUM CHLORIDE 10 MEQ/100ML IV SOLN
INTRAVENOUS | Status: AC
Start: 2012-08-14 — End: 2012-08-14
  Administered 2012-08-14: 10 meq via INTRAVENOUS
  Filled 2012-08-14: qty 100

## 2012-08-14 MED ORDER — IOHEXOL 300 MG/ML  SOLN
150.0000 mL | Freq: Once | INTRAMUSCULAR | Status: AC | PRN
  Administered 2012-08-14: 150 mL

## 2012-08-14 NOTE — Progress Notes (Signed)
Physical Therapy Treatment Patient Details Name: [name] MRN: [id number] DOB: Sep 02, 1960 Today's Date: 08/14/2012 Time: 4098-1191 PT Time Calculation (min): 24 min  PT Assessment / Plan / Recommendation Comments on Treatment Session  Pt agreeable to participate in ambulation and exercise. Patient demonstrates deficits in functional mobility at this time seocndary to deconditioning, increased pain with activity, and overall "unwell" feeling.  Patient able to ambulate on rm air but required multiple breaks to improve saturation. Patient is unsteady with ambulation but attribute this to pain and "all the wires" currently running.  Will continue to see patient to progress activity as tolerated. Patient may require short term SNF stay upon discharge pending progress. (Will continue to monitor and advise change in plan as needed)    Follow Up Recommendations        Does the patient have the potential to tolerate intense rehabilitation     Barriers to Discharge        Equipment Recommendations       Recommendations for Other Services    Frequency Min 3X/week   Plan Discharge plan remains appropriate    Precautions / Restrictions     Pertinent Vitals/Pain 8/10 but no significant evidence of distress per vitals    Mobility  Bed Mobility Bed Mobility: Rolling Right;Right Sidelying to Sit;Sitting - Scoot to [place] of Bed;Sit to Sidelying Right Rolling Right: 5: Supervision Right Sidelying to Sit: 4: Min assist Sitting - Scoot to Edge of Bed: 4: Min assist Sit to Sidelying Right: 4: Min assist Details for Bed Mobility Assistance: Assist for LE lift  to supine and trunk support to sit Transfers Transfers: Sit to Stand;Stand to Sit Sit to Stand: 4: Min assist;From bed Stand to Sit: 4: Min guard;To bed Details for Transfer Assistance: Patient requires assist to initiate pull to stand Ambulation/Gait Ambulation/Gait Assistance: 4: Min guard;4: Min Environmental consultant (Feet): 100  Feet Assistive device: None Ambulation/Gait Assistance Details: Min guard mostly; occassional assist for balance checks; VCs for controlled gait and rest breaks to improve saturation Gait Pattern: Step-through pattern;Decreased stride length;Trunk flexed Gait velocity: decreased General Gait Details: patient very rigid and max cues for upright posture      PT Goals Acute Rehab PT Goals PT Goal: Sit to Stand - Progress: Progressing toward goal PT Goal: Stand to Sit - Progress: Progressing toward goal PT Goal: Ambulate - Progress: Progressing toward goal  Visit Information  Last PT Received On: 08/14/12 Assistance Needed: +1    Subjective Data  Subjective: I keep tripping on all this stuff   Cognition  Overall Cognitive Status: Appears within functional limits for tasks assessed/performed Arousal/Alertness: Awake/alert Orientation Level: Appears intact for tasks assessed Behavior During Session: [facility] for tasks performed    Balance  Balance Balance Assessed: Yes Dynamic Sitting Balance Dynamic Sitting - Balance Support: No upper extremity supported;Feet supported Dynamic Sitting - Level of Assistance: 7: Independent Dynamic Standing Balance Dynamic Standing - Balance Support: No upper extremity supported;During functional activity Dynamic Standing - Level of Assistance: 5: Stand by assistance (patient able to perform self care while standing in bathroom)  End of Session PT - End of Session Equipment Utilized During Treatment: Gait belt Activity Tolerance: Patient limited by fatigue;Patient limited by pain Patient left: in bed;with call bell/phone within reach Nurse Communication: Mobility status (Activity and O2 saturation levels)   GP     [name] 08/14/2012, 3:38 PM Charlotte Crumb, PT DPT  [phone]

## 2012-08-14 NOTE — Progress Notes (Signed)
4 Days Post-Op  Subjective: Patient a bit confused today, unable to coordinate urinal this am.  Pt thinks he is supposed to be transferred to [facility] or [place], re-assureed the patient is staying here.  Pt much improved today, pain much better well controlled.  No nausea/vomiting, urinating on own, no BM or flatus yet.  Pt is very hungry/thirsty.  Pt ambulated with PT/OT yesterday.  Objective: Vital signs in last 24 hours: Temp:  [97.5 F (36.4 C)-99.8 F (37.7 C)] 98.9 F (37.2 C) (01/14 0555) Pulse Rate:  [90-99] 93  (01/14 0555) Resp:  [16-27] 20  (01/14 0842) BP: (142-180)/(68-89) 172/73 mmHg (01/14 0555) SpO2:  [90 %-99 %] 97 % (01/14 0842)    Intake/Output from previous day: 01/13 0701 - 01/14 0700 In: 2618.5 [I.V.:1308; TPN:1310.5] Out: 3075 [Urine:2350; Emesis/NG output:450; Drains:275] Intake/Output this shift: Total I/O In: -  Out: 500 [Urine:500]  PE: Gen:  Alert, NAD, pleasant Card:  RRR, no M/G/R heard Pulm:  CTA, no W/R/R Abd: Soft, NT/ND, +BS, no HSM, incisions C/D/I, drain with sanguinous drainage (no bile colored drainage)   Lab Results:   Basename 08/14/12 0400 08/13/12 0640  WBC 13.1* 14.3*  HGB 9.4* 9.5*  HCT 27.4* 27.9*  PLT 140* 133*   BMET  Basename 08/14/12 0400 08/13/12 0640  NA 139 140  K 3.4* 3.7  CL 101 102  CO2 32 32  GLUCOSE 124* 120*  BUN 13 16  CREATININE 0.79 0.77  CALCIUM 8.5 8.4   PT/INR No results found for this basename: LABPROT:2,INR:2 in the last 72 hours CMP     Component Value Date/Time   NA 139 08/14/2012 0400   K 3.4* 08/14/2012 0400   CL 101 08/14/2012 0400   CO2 32 08/14/2012 0400   GLUCOSE 124* 08/14/2012 0400   BUN 13 08/14/2012 0400   CREATININE 0.79 08/14/2012 0400   CALCIUM 8.5 08/14/2012 0400   PROT 5.2* 08/13/2012 0640   ALBUMIN 2.2* 08/13/2012 0640   AST 22 08/13/2012 0640   ALT 17 08/13/2012 0640   ALKPHOS 55 08/13/2012 0640   BILITOT 0.5 08/13/2012 0640   GFRNONAA >90 08/14/2012 0400   GFRAA >90  08/14/2012 0400   Lipase     Component Value Date/Time   LIPASE 31 08/09/2012 1356       Studies/Results: Dg Chest Port 1 View  08/13/2012  *RADIOLOGY REPORT*  Clinical Data: Follow up airspace disease, concern for aspiration 01/10  PORTABLE CHEST - 1 VIEW  Comparison: Prior chest x-ray 08/10/2012  Findings: The patient has been extubated.  Stable position of nasogastric tube and right upper extremity approach PICC.  The tip of the PICC catheter projects over the superior cavoatrial junction.  Stable left apical and medial pleural parenchymal scarring versus fibrosis.  Overall, the inspiratory volumes are low there are linear bibasilar opacities most likely reflecting atelectasis.  New blunting of the right costophrenic angle may reflect a small pleural effusion.  IMPRESSION:  1.  Interval extubation. 2.  Persistent low inspiratory volumes with bibasilar opacities favored to reflect atelectasis. 3.  Likely small right pleural effusion.   Original Report Authenticated By: Malachy Moan, M.D.      Assessment/Plan POD #3 s/p Ex Lap with oversew of bleeding duodenal artery and ulcer  -Urinating on own  -Cont PICC line and TPN, NG tube for now -Will do gastrograph study through NG tube to ensure no duodenal leak at surgery site prior to starting clears Confusion - will continue to monitor, likely  due to withdrawal/pain meds, may need condom cath if not able to urinate on own ABL Anemia - stable  HTN - medicine following  Duodenal ulcer  ETOH Abuse - CIWA protocol  VTE - SCD's, no lovenox due to bleeding  FEN - TPN, IVF DISP - Hopefully start progressing diet soon and weaning off TPN, when on PO's   Appreciate IM Hospitalist staying on as consult, will take him on as primary     LOS: 5 days    DORT, Flavio Lindroth 08/14/2012, 9:05 AM Pager: [phone]

## 2012-08-14 NOTE — Progress Notes (Addendum)
PROGRESS NOTE  [name] ZOX:[id number] DOB: [birth year] DOA: 08/09/2012 PCP: Default, Provider, MD  Brief narrative: This 52 year old African American male was admitted 08/09/2012. He complained of a one-week history of continuous nausea vomiting and emesis that suddenly became bloody. This is in setting of drinking 40 ounces daily of alcohol. He also describe darkening of her stool that period of time. He was admitted to step down unit and during the endoscopic procedure on 1.10 was still bleeding from a large post duodenal ulcer with blood pressure in the 70s, and an attempt was made by gastroenterologist to clip the area x2. Because of hemodynamic instability he was intubated. He went the OR emergently and had Exploratory Laprarotomy He was transfused 3 units of blood and 1 unit of FFP He was extubated on 1.11 and thought to be stable on 1.12 qand transferred back to [facility] for Gen Med issues-[facility] have assumed 1ry care  Past medical history-As per Problem lis  Consultants:  PCCM-signed off 11.13  GI-Singed off 1.10  Gen surgery  Procedures:  EGD 08/10/2012  Intubated 1.10  Extubated 1.11  Exploratory laparotomy 08/09/2012-JP drain R side placed 1/10, NG tube in situ 1/10  Antibiotics:  None currently   Subjective  Upset Wishes to eat-doesn't understand why he cannot despite my explanations Has a court date 1.23 and worried about losing his SSI check Will not let me examine him   Objective    Interim History: No other issues  Telemetry: None telemetry at present  Objective: Filed Vitals:   08/14/12 0925 08/14/12 1150 08/14/12 1410 08/14/12 1550  BP: 149/68  134/72   Pulse: 97  107 10  Temp: 99.1 F (37.3 C)  98.4 F (36.9 C)   TempSrc: Oral  Oral   Resp: 25 20 23 24   Height:      Weight:      SpO2: 97% 97% 95% 99%    Intake/Output Summary (Last 24 hours) at 08/14/12 1757 Last data filed at 08/14/12 1411  Gross per 24 hour  Intake  2618.5 ml  Output   3590 ml  Net -971.5 ml    Exam:  Refused Exam citing-"if your not a surgeon, I don't want to have anything to do with you"  Data Reviewed: Basic Metabolic Panel:  Lab 08/14/12 8119 08/13/12 0640 08/12/12 0420 08/11/12 0317 08/11/12 0206 08/10/12 1530  NA 139 140 -- 142 144 144  K 3.4* 3.7 -- -- -- --  CL 101 102 -- 108 110 110  CO2 32 32 -- 26 26 24   GLUCOSE 124* 120* -- 104* 103* 154*  BUN 13 16 -- 19 19 23   CREATININE 0.79 0.77 -- 0.96 0.98 0.83  CALCIUM 8.5 8.4 -- 8.0* 8.0* 7.6*  MG 2.0 2.0 2.1 -- 1.6 --  PHOS 3.1 2.6 3.6 -- -- --   Liver Function Tests:  Lab 08/13/12 0640 08/11/12 0317 08/11/12 0206 08/09/12 1356  AST 22 30 29 13   ALT 17 23 24 7   ALKPHOS 55 45 46 36*  BILITOT 0.5 0.6 0.6 0.1*  PROT 5.2* 4.8* 4.8* 4.3*  ALBUMIN 2.2* 2.4* 2.4* 2.1*    Lab 08/09/12 1356  LIPASE 31  AMYLASE --   No results found for this basename: AMMONIA:5 in the last 168 hours CBC:  Lab 08/14/12 0400 08/13/12 0640 08/12/12 2000 08/12/12 1200 08/12/12 0420 08/10/12 1530 08/09/12 1356  WBC 13.1* 14.3* 14.6* 15.2* 16.0* -- --  NEUTROABS -- 12.3* -- -- -- 12.8* 10.2*  HGB  9.4* 9.5* 9.6* 9.9* 10.8* -- --  HCT 27.4* 27.9* 28.2* 29.0* 31.9* -- --  MCV 85.9 86.9 87.3 87.1 87.2 -- --  PLT 140* 133* 123* 134* 132* -- --   Cardiac Enzymes:  Lab 08/11/12 2020 08/11/12 0930 08/11/12 0317 08/10/12 1534 08/09/12 1402  CKTOTAL -- -- -- -- --  CKMB -- -- -- -- --  CKMBINDEX -- -- -- -- --  TROPONINI <0.30 <0.30 <0.30 <0.30 <0.30   BNP: No components found with this basename: POCBNP:5 CBG:  Lab 08/14/12 1602 08/14/12 1201 08/14/12 0801 08/14/12 0328 08/13/12 2326  GLUCAP 104* 109* 139* 114* 130*    Recent Results (from the past 240 hour(s))  MRSA PCR SCREENING     Status: Normal   Collection Time   08/09/12  7:26 PM      Component Value Range Status Comment   MRSA by PCR NEGATIVE  NEGATIVE Final      Studies:              All Imaging reviewed and is as per  above notation   Scheduled Meds:    . antiseptic oral rinse  15 mL Mouth Rinse q12n4p  . chlorhexidine  15 mL Mouth Rinse BID  . HYDROmorphone PCA 0.3 mg/mL   Intravenous Q4H  . insulin aspart  0-9 Units Subcutaneous Q4H   Continuous Infusions:    . sodium chloride 20 mL/hr (08/13/12 1342)  . TPN (CLINIMIX) +/- additives 100 mL/hr at 08/13/12 1755   And  . fat emulsion 250 mL (08/13/12 1756)  . pantoprozole (PROTONIX) infusion 8 mg/hr (08/14/12 1111)  . TPN (CLINIMIX) +/- additives 100 mL/hr at 08/14/12 1723     Assessment/Plan: 1. Acute resp failure-?Aspiration-CXR 1/13 shows only atelectasis-Unsure what event prompted this-probable sedation with Endoscopy in an already hemodynamically tenuous patient-No current role for Abx coverage at present-Gastrograffin done 1.14 showed no Duod perf 2. Acute upper GI bleed from posteriorDuodenal ulcer status post Exploratory Laparotomy 1/10-doing stable. Hemoglobin stable at present. Continue protonic Gtt 8 mg per as per surgery. Continue TPN/Clinimix as per surgery 100 cc per hour. Pain management with PCA dilaudid and the decision to feed to be determined as the patient progress-? Clamping trial planned for 1/15?Marland Kitchen Continue IV fluids is normal saline 50 cc per hour till feeds-JP Drain has put out about 275 cc overnight 1/12, NGT ~450 cc 3. FEN-Continue TPN per PICC line placed 08/12/12-CBG q4 hourly-monitor and cover c insulin if prn-sugars 104-139 4. Acute blood loss anemia-hemoglobin has been stable-Daily CBC. 5. Hypertension-unclear if this is because of pain. Patient is on hydralazine 10-40 mg every 4 when necessary which are changed to 5-10 mg IV every 4 when necessary.  If he persistently hypertensive despite lack pain, could add calcium. blocker amlodipine 5-10 mg when able to take PO meds 6. H/o ETOH abuse-CIWA protocol added.  Not being measured, but suspect would have clinically withdrawn over 3-4 days in hospital; without  alcohol 7. Social issues-Has court date-need s/SW input   Code Status: Full Family Communication: none at bedside Disposition Plan: Per general surgery-[facility] will follow along with you.  Might need SNF per PT note   Pleas Koch, MD  [facility] Pager [phone] 08/14/2012, 5:57 PM    LOS: 5 days

## 2012-08-14 NOTE — Progress Notes (Signed)
Went into pt's room and NGT had come out.  Pt very upset and states he is tired of hurting and he wants something to drink.  Tried to explain to pt why he could not have anything to eat or drink but pt very agitated.  MD notified and awaiting orders.  Hector Shade [place]

## 2012-08-14 NOTE — Progress Notes (Signed)
RN in to see patient. Pt c/o about NPO status and not "being treated right". Reviewed plan of care with patient., including treatment /xrays and possible plan for re-insertion of NGT for today. Agreed with  pt that  nothing will be done against his will. And our goal to help him feel better. Pt very aware of being in [facility] and recent surgery and events of earlier today. Does not recall pulling out NGT. Emotional support provided Will plan to review plan of care frequently.

## 2012-08-14 NOTE — Progress Notes (Signed)
OT Cancellation Note  Patient Details Name: [name] MRN: [id number] DOB: 05-Jan-1961   Cancelled Treatment:    Reason Eval/Treat Not Completed: Patient at procedure or test/ unavailable  Boykin Reaper [phone] 08/14/2012, 4:32 PM

## 2012-08-14 NOTE — Progress Notes (Signed)
PARENTERAL NUTRITION CONSULT NOTE - Follow up  Pharmacy Consult for TPN Indication: Bleeding duodenal ulcer  No Known Allergies  Patient Measurements: Height: 5\' 9"  (175.3 cm) Weight: 173 lb 4.5 oz (78.6 kg) IBW/kg (Calculated) : 70.7   Vital Signs: Temp: 98.9 F (37.2 C) (01/14 0555) Temp src: Oral (01/14 0555) BP: 172/73 mmHg (01/14 0555) Pulse Rate: 93  (01/14 0555) Intake/Output from previous day: 01/13 0701 - 01/14 0700 In: 2618.5 [I.V.:1308; TPN:1310.5] Out: 3075 [Urine:2350; Emesis/NG output:450; Drains:275] Intake/Output from this shift:    Labs:  [facility] 08/14/12 0400 08/13/12 0640 08/12/12 2000  WBC 13.1* 14.3* 14.6*  HGB 9.4* 9.5* 9.6*  HCT 27.4* 27.9* 28.2*  PLT 140* 133* 123*  APTT -- -- --  INR -- -- --     Basename 08/14/12 0400 08/13/12 0640 08/12/12 0420  NA 139 140 --  K 3.4* 3.7 --  CL 101 102 --  CO2 32 32 --  GLUCOSE 124* 120* --  BUN 13 16 --  CREATININE 0.79 0.77 --  LABCREA -- -- --  CREAT24HRUR -- -- --  CALCIUM 8.5 8.4 --  MG 2.0 2.0 2.1  PHOS 3.1 2.6 3.6  PROT -- 5.2* --  ALBUMIN -- 2.2* --  AST -- 22 --  ALT -- 17 --  ALKPHOS -- 55 --  BILITOT -- 0.5 --  BILIDIR -- -- --  IBILI -- -- --  PREALBUMIN -- 10.1* --  TRIG -- 54 --  CHOLHDL -- -- --  CHOL -- 106 --   Estimated Creatinine Clearance: 108 ml/min (by C-G formula based on Cr of 0.79).    Basename 08/14/12 0801 08/14/12 0328 08/13/12 2326  GLUCAP 139* 114* 130*    Medical History: Past Medical History  Diagnosis Date  . Abdominal  pain, other specified site   . GERD (gastroesophageal reflux disease)   . Abnormal CT scan, esophagus 03/2012  . Substance abuse 03/2012    tox screen positive for cocaine.     Medications:  No prescriptions prior to admission    Insulin Requirements in the past 24 hours:  No insulin required; SSI  Current Nutrition:  Clinimix E 5/15 at 128ml/hr and Intralipid 20% 51ml/hr MWF  Nutritional Goals per RD:  1650-1750 kCal,  110-120 grams of protein per day  Assessment:  Admit: Mr. Aumiller was admitted with hematemesis and Hgb of 5 on admit. He underwent EGD which revealed active bleeding duodenal artery from a duodenal ulcer, which could not be controlled endoscopically. Patient is now s/p urgent surgical repair of his bleeding duodenal ulcer (1/10).   GI: NGT in place, suctioning out bloody output- 150cc in last 24h. No BM reported, patient is NPO and complains of abd pain and nausea. Noted he is on a PPI drip  Noted PO MVI ordered. Moderate refeeding risk as pt has poor baseline nutritional status and has been NPO for ~ 7 days. Continue to tolerate TPN at goal rate. Endo: No prior hx of DM or other endocrine disorders. Not currently on insulin supplementation. Lytes: k low today, ordered 4 runs kcl Renal: Scr nml, UOP is good at 1.1 ml/kg/hr. Pulm: 96% sat on ra Cards: VSS but hypertensive; bp 172/73; consider adding scheduled med Hepatobil: Alb low at 2.4, LFTs otherwise nml. Neuro: WNL, conversant, but sleepy ID: Afeb, WBC elevated at 13, PCT 0.15. No current abx, received Ancef perioperatively. Best Practices: SCDs, IV PPI gtt TPN Access: DL PICC line right basilic TPN day#: 3  Plan:  Continue  TPN tat 170ml/hr to give slightly above goal for calories. Patient will receive estimated 120g protein and 1909 kcal per day based upon 3 times weekly lipid supplementation. - Will supplement MVI, trace elements and IV fats on MWF due to ongoing Publishing copy.- Continue sensitive scale SSI  - IVF at kvo Give 4 runs kcl - Will f/up TPN labs in AM  [place], PharmD, [place] Pager [phone] 08/14/2012 8:42 AM

## 2012-08-15 LAB — CBC
HCT: 28 % — ABNORMAL LOW (ref 39.0–52.0)
Platelets: 185 10*3/uL (ref 150–400)
RDW: 14.1 % (ref 11.5–15.5)
WBC: 12 10*3/uL — ABNORMAL HIGH (ref 4.0–10.5)

## 2012-08-15 LAB — PHOSPHORUS: Phosphorus: 4.2 mg/dL (ref 2.3–4.6)

## 2012-08-15 LAB — GLUCOSE, CAPILLARY
Glucose-Capillary: 119 mg/dL — ABNORMAL HIGH (ref 70–99)
Glucose-Capillary: 114 mg/dL — ABNORMAL HIGH (ref 70–99)

## 2012-08-15 LAB — MAGNESIUM: Magnesium: 2.3 mg/dL (ref 1.5–2.5)

## 2012-08-15 MED ORDER — FAT EMULSION 20 % IV EMUL
250.0000 mL | INTRAVENOUS | Status: AC
  Administered 2012-08-15: 250 mL via INTRAVENOUS
  Filled 2012-08-15: qty 250

## 2012-08-15 MED ORDER — TRACE MINERALS CR-CU-F-FE-I-MN-MO-SE-ZN IV SOLN
INTRAVENOUS | Status: AC
  Administered 2012-08-15: 17:00:00 via INTRAVENOUS
  Filled 2012-08-15: qty 2400

## 2012-08-15 NOTE — Progress Notes (Signed)
NUTRITION FOLLOW UP  Intervention:   TPN per pharmacy. RD to continue to follow nutrition care plan.  Nutrition Dx:   Inadequate oral intake related to inability to eat as evidenced by NPO status. Ongoing.  Goal:   Pt to meet >/= 90% of their estimated nutrition needs; met.  Monitor:   TPN adequacy, transition to oral diet vs EN, weights, labs, I/O's  Assessment:   5 days Post-Op: EXPLORATORY LAPAROTOMY (N/A), duodenotomy with oversewing bleeding duodenal ulcer.  NGT inadvertently came out yesterday. Pt was upset and stated he is tired of hurting and wants something to drink. NGT replaced. 600 cc bilious output total yesterday. Per MD, hopefully pt will have clamping trials on Thursday.  Patient is receiving TPN with Clinimix E 5/15 @ 100 ml/hr. Lipids (20% IVFE @ 10 ml/hr), multivitamins, and trace elements are provided 3 times weekly (MWF) due to national backorder. This provides 1909 kcal and 120 grams protein daily (based on weekly average).  Meets 100% minimum estimated kcal and 100% minimum estimated protein needs. Noted pharmacist concern for moderate risk for refeeding syndrome. Current potassium, magnesium and phosphorus WNL.  Height: Ht Readings from Last 1 Encounters:  08/09/12 5\' 9"  (1.753 m)    Weight Status:   Wt Readings from Last 1 Encounters:  08/12/12 173 lb 4.5 oz (78.6 kg)    Re-estimated needs:  Kcal: 1800 - 2100 Protein: 100 - 120 grams Fluid: 1.8 - 2 liters daily  Skin: intact  Diet Order: NPO   Intake/Output Summary (Last 24 hours) at 08/15/12 1114 Last data filed at 08/15/12 1000  Gross per 24 hour  Intake 3719.17 ml  Output   4800 ml  Net -1080.83 ml    Last BM: unknown   Labs:   Lab 08/15/12 0505 08/14/12 0400 08/13/12 0640 08/11/12 0317  NA -- 139 140 142  K -- 3.4* 3.7 4.0  CL -- 101 102 108  CO2 -- 32 32 26  BUN -- 13 16 19   CREATININE -- 0.79 0.77 0.96  CALCIUM -- 8.5 8.4 8.0*  MG 2.3 2.0 2.0 --  PHOS 4.2 3.1 2.6 --    GLUCOSE -- 124* 120* 104*    CBG (last 3)   Basename 08/15/12 0738 08/15/12 0426 08/14/12 2359  GLUCAP 114* 119* 111*    Scheduled Meds:    . antiseptic oral rinse  15 mL Mouth Rinse q12n4p  . chlorhexidine  15 mL Mouth Rinse BID  . HYDROmorphone PCA 0.3 mg/mL   Intravenous Q4H    Continuous Infusions:    . sodium chloride 20 mL/hr (08/13/12 1342)  . TPN (CLINIMIX) +/- additives     And  . fat emulsion    . pantoprozole (PROTONIX) infusion 8 mg/hr (08/14/12 2258)  . TPN [facility]) +/- additives 100 mL/hr at 08/14/12 1723    Ryan Motto MS, RD, LDN Pager: [phone] After-hours pager: [phone]

## 2012-08-15 NOTE — Evaluation (Signed)
Occupational Therapy Evaluation Patient Details Name: [name] MRN: [id number] DOB: 1960-11-30 Today's Date: 08/15/2012 Time: 4098-1191 OT Time Calculation (min): 28 min  OT Assessment / Plan / Recommendation Clinical Impression  Pt admitted with bloody emesis due to large post duodenal ulcer requiring laparotomy in the setting of alcohol abuse.  Pt remains NPO with NG tube with weakness and decreased activity tolerance and requires min assist for mobility and ADL.  Will follow acutely.    OT Assessment  Patient needs continued OT Services    Follow Up Recommendations  Home health OT    Barriers to Discharge      Equipment Recommendations  None recommended by OT    Recommendations for Other Services    Frequency  Min 2X/week    Precautions / Restrictions Precautions Precautions: Fall   Pertinent Vitals/Pain Abdomen, did not rate, PCA pump encouraged, repositioned    ADL  Eating/Feeding: NPO Grooming: Supervision/safety;Wash/dry hands Where Assessed - Grooming: Unsupported standing Upper Body Bathing: Supervision/safety Where Assessed - Upper Body Bathing: Unsupported sitting Lower Body Bathing: Minimal assistance Where Assessed - Lower Body Bathing: Unsupported standing Upper Body Dressing: Moderate assistance Where Assessed - Upper Body Dressing: Unsupported sitting Lower Body Dressing: Supervision/safety Where Assessed - Lower Body Dressing: Unsupported sit to stand Toileting - Clothing Manipulation and Hygiene: Minimal assistance Where Assessed - Toileting Clothing Manipulation and Hygiene: Standing Equipment Used: Gait belt Transfers/Ambulation Related to ADLs: min guard assist, no device ADL Comments: Pt's ability to perform ADL hindered by multiple lines impeding hand use. Pt noted to need reminders during pericare activity, easily off task.    OT Diagnosis: Generalized weakness;Acute pain;Cognitive deficits  OT Problem List: Decreased activity  tolerance;Impaired balance (sitting and/or standing);Decreased cognition;Pain;Decreased strength OT Treatment Interventions: Self-care/ADL training;Therapeutic activities;Patient/family education   OT Goals Acute Rehab OT Goals OT Goal Formulation: With patient Time For Goal Achievement: 08/22/12 Potential to Achieve Goals: Good ADL Goals Pt Will Perform Grooming: Standing at sink;Independently ADL Goal: Grooming - Progress: Goal set today Pt Will Perform Upper Body Bathing: Sitting at sink;with modified independence ADL Goal: Upper Body Bathing - Progress: Goal set today Pt Will Perform Lower Body Bathing: Sit to stand from chair;Sitting at sink;with modified independence ADL Goal: Lower Body Bathing - Progress: Goal set today Pt Will Perform Upper Body Dressing: Independently;Sitting, chair ADL Goal: Upper Body Dressing - Progress: Goal set today Pt Will Perform Lower Body Dressing: Sit to stand from bed;with modified independence ADL Goal: Lower Body Dressing - Progress: Goal set today Pt Will Transfer to Toilet: Independently;Ambulation;Comfort height toilet ADL Goal: Toilet Transfer - Progress: Goal set today Pt Will Perform Toileting - Clothing Manipulation: Independently;Standing ADL Goal: Toileting - Clothing Manipulation - Progress: Goal set today Pt Will Perform Toileting - Hygiene: Independently;Sit to stand from 3-in-1/toilet ADL Goal: Toileting - Hygiene - Progress: Goal set today Miscellaneous OT Goals Miscellaneous OT Goal #1: Pt will gather necessary items to perform ADL independently. OT Goal: Miscellaneous Goal #1 - Progress: Goal set today  Visit Information  Last OT Received On: 08/15/12 Assistance Needed: +1 (2 helpful due to lines) PT/OT Co-Evaluation/Treatment: Yes    Subjective Data  Subjective: "Thank you." Patient Stated Goal: Return home with assist of friends and sister.   Prior Functioning     Home Living Lives With: Friend(s) Available Help at  Discharge: Available PRN/intermittently Type of Home: House Home Access: Level entry Home Layout: One level Bathroom Shower/Tub: Health visitor: Standard Home Adaptive Equipment: None Prior Function Level  of Independence: Independent Able to Take Stairs?: Yes Driving: No Vocation: Unemployed Communication Communication: No difficulties Dominant Hand: Right         Vision/Perception     Cognition  Overall Cognitive Status: No family/caregiver present to determine baseline cognitive functioning (pt with hx of alcoholism, easily agitated) Arousal/Alertness: Awake/alert Orientation Level: Appears intact for tasks assessed Behavior During Session: Flat affect Cognition - Other Comments: While in bathroom to perform pericare, pt began washing other body parts instead and repeatedly requiring verbal redirection.    Extremity/Trunk Assessment Right Upper Extremity Assessment RUE ROM/Strength/Tone: WFL for tasks assessed RUE Coordination: WFL - gross/fine motor Left Upper Extremity Assessment LUE ROM/Strength/Tone: WFL for tasks assessed LUE Coordination: WFL - gross/fine motor Trunk Assessment Trunk Assessment:  (walks with forward head)     Mobility Bed Mobility Bed Mobility: Not assessed Transfers Transfers: Sit to Stand;Stand to Sit Sit to Stand: 4: Min guard;Without upper extremity assist;From chair/3-in-1 Stand to Sit: 4: Min guard;With upper extremity assist;To chair/3-in-1 Details for Transfer Assistance: verbal cues for hand placement     Shoulder Instructions     Exercise     Balance Balance Balance Assessed: Yes Dynamic Standing Balance Dynamic Standing - Balance Support: No upper extremity supported;During functional activity Dynamic Standing - Level of Assistance: 5: Stand by assistance   End of Session OT - End of Session Equipment Utilized During Treatment: Gait belt Activity Tolerance: Patient limited by fatigue Patient left: in  chair;with call bell/phone within reach Nurse Communication: Mobility status (activity tolerance, NG tube management)  GO     Evern Bio 08/15/2012, 2:51 PM [id number]

## 2012-08-15 NOTE — Progress Notes (Signed)
PROGRESS NOTE  [name] ZOX:[id number] DOB: 1961/05/26 DOA: 08/09/2012 PCP: Default, Provider, MD  Brief narrative: This 52 year old African American male was admitted 08/09/2012. He complained of a one-week history of continuous nausea vomiting and emesis that suddenly became bloody. This is in setting of drinking 40 ounces daily of alcohol. He also describe darkening of her stool that period of time. He was admitted to step down unit and during the endoscopic procedure on 1.10 was still bleeding from a large post duodenal ulcer with blood pressure in the 70s, and an attempt was made by gastroenterologist to clip the area x2. Because of hemodynamic instability he was intubated. He went the OR emergently and had Exploratory Laprarotomy He was transfused 3 units of blood and 1 unit of FFP He was extubated on 1.11 and thought to be stable on 1.12 qand transferred back to [facility] for Gen Med issues  Past medical history-As per Problem lis  Consultants:  PCCM-signed off 11.13  GI-Singed off 1.10  Gen surgery  Procedures:  EGD 08/10/2012  Intubated 1.10  Extubated 1.11  Exploratory laparotomy 08/09/2012-JP drain R side placed 1/10, NG tube in situ 1/10  Antibiotics:  None currently   Subjective  Doing well today, has no complaints. Denies nausea/vomiting. Was able to walk on the hallway today without problems.    Objective   Interim History: No other issues  Telemetry: None telemetry at present  Objective: Filed Vitals:   08/15/12 0823 08/15/12 0952 08/15/12 1147 08/15/12 1316  BP:  151/84  140/69  Pulse:  82  84  Temp:  98.4 F (36.9 C)  98.5 F (36.9 C)  TempSrc:  Oral  Oral  Resp: 18 18 14 19   Height:      Weight:      SpO2: 100% 99% 100% 99%    Intake/Output Summary (Last 24 hours) at 08/15/12 1413 Last data filed at 08/15/12 1316  Gross per 24 hour  Intake 3739.17 ml  Output   4380 ml  Net -640.83 ml    Exam:  GEN: NAD Pulm: Lung  clear to auscultation, good air movement CV: RRR, no MRG Ext: no edema  Data Reviewed: Basic Metabolic Panel:  Lab 08/15/12 8119 08/14/12 0400 08/13/12 0640 08/12/12 0420 08/11/12 0317 08/11/12 0206 08/10/12 1530  NA -- 139 140 -- 142 144 144  K -- 3.4* 3.7 -- -- -- --  CL -- 101 102 -- 108 110 110  CO2 -- 32 32 -- 26 26 24   GLUCOSE -- 124* 120* -- 104* 103* 154*  BUN -- 13 16 -- 19 19 23   CREATININE -- 0.79 0.77 -- 0.96 0.98 0.83  CALCIUM -- 8.5 8.4 -- 8.0* 8.0* 7.6*  MG 2.3 2.0 2.0 2.1 -- 1.6 --  PHOS 4.2 3.1 2.6 3.6 -- -- --   Liver Function Tests:  Lab 08/13/12 0640 08/11/12 0317 08/11/12 0206 08/09/12 1356  AST 22 30 29 13   ALT 17 23 24 7   ALKPHOS 55 45 46 36*  BILITOT 0.5 0.6 0.6 0.1*  PROT 5.2* 4.8* 4.8* 4.3*  ALBUMIN 2.2* 2.4* 2.4* 2.1*    Lab 08/09/12 1356  LIPASE 31  AMYLASE --   No results found for this basename: AMMONIA:5 in the last 168 hours CBC:  Lab 08/15/12 0505 08/14/12 0400 08/13/12 0640 08/12/12 2000 08/12/12 1200 08/10/12 1530 08/09/12 1356  WBC 12.0* 13.1* 14.3* 14.6* 15.2* -- --  NEUTROABS -- -- 12.3* -- -- 12.8* 10.2*  HGB 9.4* 9.4* 9.5*  9.6* 9.9* -- --  HCT 28.0* 27.4* 27.9* 28.2* 29.0* -- --  MCV 87.5 85.9 86.9 87.3 87.1 -- --  PLT 185 140* 133* 123* 134* -- --   Cardiac Enzymes:  Lab 08/11/12 2020 08/11/12 0930 08/11/12 0317 08/10/12 1534 08/09/12 1402  CKTOTAL -- -- -- -- --  CKMB -- -- -- -- --  CKMBINDEX -- -- -- -- --  TROPONINI <0.30 <0.30 <0.30 <0.30 <0.30   BNP: No components found with this basename: POCBNP:5 CBG:  Lab 08/15/12 0738 08/15/12 0426 08/14/12 2359 08/14/12 2012 08/14/12 1602  GLUCAP 114* 119* 111* 116* 104*    Recent Results (from the past 240 hour(s))  MRSA PCR SCREENING     Status: Normal   Collection Time   08/09/12  7:26 PM      Component Value Range Status Comment   MRSA by PCR NEGATIVE  NEGATIVE Final      Studies:              All Imaging reviewed and is as per above notation   Scheduled  Meds:    . antiseptic oral rinse  15 mL Mouth Rinse q12n4p  . chlorhexidine  15 mL Mouth Rinse BID  . HYDROmorphone PCA 0.3 mg/mL   Intravenous Q4H   Continuous Infusions:    . sodium chloride 20 mL/hr (08/13/12 1342)  . TPN (CLINIMIX) +/- additives     And  . fat emulsion    . pantoprozole (PROTONIX) infusion 8 mg/hr (08/15/12 1146)  . TPN (CLINIMIX) +/- additives 100 mL/hr at 08/14/12 1723     Assessment/Plan: 1. Acute resp failure-?Aspiration-CXR 1/13 shows only atelectasis-Unsure what event prompted this-probable sedation with Endoscopy in an already hemodynamically tenuous patient-No current role for Abx coverage at present-Gastrograffin done 1.14 showed no Duod perf  2. Acute upper GI bleed from posteriorDuodenal ulcer status post Exploratory Laparotomy 1/10-doing stable. Hemoglobin stable at present, 12 today. Continue protonic Gtt 8 mg per as per surgery. Continue TPN/Clinimix as per surgery 100 cc per hour. Pain management with PCA dilaudid and the decision to feed to be determined as the patient progress. Continue IV fluids is normal saline 50 cc per hour until able to tolerate po intake.   3. FEN-Continue TPN per PICC line placed 08/12/12-CBG q4 hourly-monitor and cover c insulin if prn-sugars 104-139  4. Acute blood loss anemia-hemoglobin has been stable  - Daily CBC.  5. Hypertension-unclear if this is because of pain. Patient is on hydralazine 10-40 mg every 4 when necessary which are changed to 5-10 mg IV every 4 when necessary.  If he persistently hypertensive despite lack pain, could add calcium. blocker amlodipine 5-10 mg when able to take PO meds. Would hold po antihypertensives for now as he is NPO. Most recent BP 140/69.   6. H/o ETOH abuse-CIWA protocol   7. Social issues-Has court date-need s/SW input   Code Status: Full Family Communication: none at bedside Disposition Plan: Per general surgery-[facility] will follow along with you.  Might need SNF per PT  note   Chelsey Redondo M. Elvera Lennox, MD [facility] Pager: [phone] Please contact night coverage between 7 p.m. - 7 a.m. ([email or web address]) 08/15/2012, 2:13 PM  LOS: 6 days

## 2012-08-15 NOTE — Progress Notes (Signed)
PARENTERAL NUTRITION CONSULT NOTE - Follow up  Pharmacy Consult for TPN Indication: Bleeding duodenal ulcer  No Known Allergies  Patient Measurements: Height: 5\' 9"  (175.3 cm) Weight: 173 lb 4.5 oz (78.6 kg) IBW/kg (Calculated) : 70.7   Vital Signs: Temp: 98 F (36.7 C) (01/15 0504) Temp src: Oral (01/14 2225) BP: 111/64 mmHg (01/15 0504) Pulse Rate: 88  (01/15 0504) Intake/Output from previous day: 01/14 0701 - 01/15 0700 In: 3719.2 [I.V.:683.3; NG/GT:70; IV Piggyback:400; TPN:2565.8] Out: 4940 [Urine:4200; Emesis/NG output:600; Drains:140] Intake/Output from this shift:    Labs:  [facility] 08/15/12 0505 08/14/12 0400 08/13/12 0640  WBC 12.0* 13.1* 14.3*  HGB 9.4* 9.4* 9.5*  HCT 28.0* 27.4* 27.9*  PLT 185 140* 133*  APTT -- -- --  INR -- -- --     Basename 08/15/12 0505 08/14/12 0400 08/13/12 0640  NA -- 139 140  K -- 3.4* 3.7  CL -- 101 102  CO2 -- 32 32  GLUCOSE -- 124* 120*  BUN -- 13 16  CREATININE -- 0.79 0.77  LABCREA -- -- --  CREAT24HRUR -- -- --  CALCIUM -- 8.5 8.4  MG 2.3 2.0 2.0  PHOS 4.2 3.1 2.6  PROT -- -- 5.2*  ALBUMIN -- -- 2.2*  AST -- -- 22  ALT -- -- 17  ALKPHOS -- -- 55  BILITOT -- -- 0.5  BILIDIR -- -- --  IBILI -- -- --  PREALBUMIN -- -- 10.1*  TRIG -- -- 54  CHOLHDL -- -- --  CHOL -- -- 106   Estimated Creatinine Clearance: 108 ml/min (by C-G formula based on Cr of 0.79).    Basename 08/15/12 0738 08/15/12 0426 08/14/12 2359  GLUCAP 114* 119* 111*    Medical History: Past Medical History  Diagnosis Date  . Abdominal  pain, other specified site   . GERD (gastroesophageal reflux disease)   . Abnormal CT scan, esophagus 03/2012  . Substance abuse 03/2012    tox screen positive for cocaine.     Medications:  No prescriptions prior to admission    Insulin Requirements in the past 24 hours:  No insulin required; SSI  Current Nutrition:  Clinimix E 5/15 at 185ml/hr and Intralipid 20% 69ml/hr MWF  Nutritional Goals  per RD:  1650-1750 kCal, 110-120 grams of protein per day  Assessment:  Admit: [name] was admitted with hematemesis and Hgb of 5 on admit. He underwent EGD which revealed active bleeding duodenal artery from a duodenal ulcer, which could not be controlled endoscopically. Patient is now s/p urgent surgical repair of his bleeding duodenal ulcer (1/10).   GI: NGT in place, suctioning out bilious output- 600cc in last 24h. LBM 1.14, patient is NPO and complains of abd pain and nausea. Noted he is on a PPI drip , will change to pepcid in tna.  Noted PO MVI ordered. Moderate refeeding risk as pt has poor baseline nutritional status and has been NPO for ~ 7 days. Continue to tolerate TPN at goal rate. Endo: No prior hx of DM or other endocrine disorders. Not currently on insulin supplementation. Will d/c cbg's d/t minimal insulin usage Lytes: lytes wnl Renal: Scr nml, UOP is good at 1.1 ml/kg/hr. Pulm: 96% sat on ra Cards: VSS but hypertensive; bp better today; consider adding scheduled med Hepatobil: Alb low at 2.4, LFTs otherwise nml. Baseline prealbumin 10.1 Neuro: WNL, conversant, b ID: Afeb, WBC elevated at 13, PCT 0.15. No current abx, received Ancef perioperatively. Best Practices: SCDs, h2ra TPN Access:  DL PICC line right basilic TPN day#: 3  Plan:  Continue TPN tat 128ml/hr to give slightly above goal for calories. Patient will receive estimated 120g protein and 1909 kcal per day based upon 3 times weekly lipid supplementation. - Will supplement MVI, trace elements and IV fats on MWF due to ongoing Publishing copy. - d/c cbg and ssi  - IVF at kvo - add pepcid to tpn and d/c protonix gtt - Will f/up TPN labs in AM  [place], PharmD, [place] Pager [phone] 08/15/2012 9:18 AM

## 2012-08-15 NOTE — Progress Notes (Signed)
Physical Therapy Treatment Patient Details Name: [name] MRN: [id number] DOB: Feb 21, 1961 Today's Date: 08/15/2012 Time: 4010-2725 PT Time Calculation (min): 28 min  PT Assessment / Plan / Recommendation Comments on Treatment Session  Pt making steady progress towards PT goals at this time, patient with increased O2 tolerance with activity today. Will continue to monitor and progress as tolerated.    Follow Up Recommendations        Does the patient have the potential to tolerate intense rehabilitation     Barriers to Discharge        Equipment Recommendations       Recommendations for Other Services    Frequency Min 3X/week   Plan Discharge plan remains appropriate    Precautions / Restrictions Precautions Precautions: Fall   Pertinent Vitals/Pain No pain rated; SpO2 on rm air 97% desaturation to 89% after 180 feet of ambulation, 1 min rest to improve to 96% on rm air.    Mobility  Bed Mobility Bed Mobility: Not assessed Transfers Transfers: Sit to Stand;Stand to Sit Sit to Stand: 4: Min guard;Without upper extremity assist;From chair/3-in-1 Stand to Sit: 4: Min guard;With upper extremity assist;To chair/3-in-1 Details for Transfer Assistance: verbal cues for hand placement Ambulation/Gait Ambulation/Gait Assistance: 4: Min guard Ambulation Distance (Feet): 260 Feet Assistive device: None Ambulation/Gait Assistance Details: VC's for rest break at approx 180' with VC's for deep breathing to increase O2 saturation Gait Pattern: Step-through pattern;Decreased stride length;Trunk flexed Gait velocity: decreased General Gait Details: patient very rigid and VC's for upright posture      PT Goals Acute Rehab PT Goals PT Goal: Sit to Stand - Progress: Progressing toward goal PT Goal: Stand to Sit - Progress: Progressing toward goal PT Goal: Ambulate - Progress: Progressing toward goal  Visit Information  Last PT Received On: 08/15/12 Assistance Needed: +1 (2  helpful due to lines)    Subjective Data  Subjective: feels good to be out of bed Patient Stated Goal: to go home   Cognition  Overall Cognitive Status: No family/caregiver present to determine baseline cognitive functioning (pt with hx of alcoholism, easily agitated) Arousal/Alertness: Awake/alert Orientation Level: Appears intact for tasks assessed Behavior During Session: Flat affect Cognition - Other Comments: While in bathroom to perform pericare, pt began washing other body parts instead and repeatedly requiring verbal redirection.    Balance  Balance Balance Assessed: Yes Dynamic Standing Balance Dynamic Standing - Balance Support: No upper extremity supported;During functional activity Dynamic Standing - Level of Assistance: 5: Stand by assistance  End of Session PT - End of Session Equipment Utilized During Treatment: Gait belt Activity Tolerance: Patient tolerated treatment well Patient left: in chair;with call bell/phone within reach Nurse Communication: Mobility status   GP     [name] 08/15/2012, 4:09 PM

## 2012-08-15 NOTE — Progress Notes (Signed)
5 Days Post-Op  Subjective: Pt doesn't understand why he is still in hospital. No flatus. Reports BM yesterday. Pain ok. 600cc bilious output ng  Objective: Vital signs in last 24 hours: Temp:  [98 F (36.7 C)-99.2 F (37.3 C)] 98 F (36.7 C) (01/15 0504) Pulse Rate:  [10-107] 88  (01/15 0504) Resp:  [11-25] 18  (01/15 0504) BP: (111-151)/(57-83) 111/64 mmHg (01/15 0504) SpO2:  [92 %-100 %] 95 % (01/15 0504) Last BM Date: 08/14/12  Intake/Output from previous day: 01/14 0701 - 01/15 0700 In: 3719.2 [I.V.:683.3; NG/GT:70; IV Piggyback:400; TPN:2565.8] Out: 4940 [Urine:4200; Emesis/NG output:600; Drains:140] Intake/Output this shift:    Alert, ox3 cta with decreased bs at bases Reg Soft, nd. approp TTP. hypoBS No edema  Lab Results:   Basename 08/15/12 0505 08/14/12 0400  WBC 12.0* 13.1*  HGB 9.4* 9.4*  HCT 28.0* 27.4*  PLT 185 140*   BMET  Basename 08/14/12 0400 08/13/12 0640  NA 139 140  K 3.4* 3.7  CL 101 102  CO2 32 32  GLUCOSE 124* 120*  BUN 13 16  CREATININE 0.79 0.77  CALCIUM 8.5 8.4   PT/INR No results found for this basename: LABPROT:2,INR:2 in the last 72 hours ABG No results found for this basename: PHART:2,PCO2:2,PO2:2,HCO3:2 in the last 72 hours  Studies/Results: Dg Abd 1 View  08/14/2012  *RADIOLOGY REPORT*  Clinical Data: NG placement  ABDOMEN - 1 VIEW  Comparison: 08/09/2012  Findings: NG tube is in the gastric antrum.  Midline skin staples from recent laparotomy.  IMPRESSION: NG tube in the gastric antrum.   Original Report Authenticated By: Janeece Riggers, M.D.    Dg Naso G Tube Plc W/fl-no Rad  08/14/2012  CLINICAL DATA: placement of NG tube   NASO G TUBE PLACEMENT WITH FLUORO  Fluoroscopy was utilized by the requesting physician.  No radiographic  interpretation.     Dg Ugi W/water Sol Cm  08/14/2012  *RADIOLOGY REPORT*  Clinical Data:  Evaluate for possible duodenal leak.  Perforating duodenal ulcer status post surgical repair.  UPPER GI  SERIES WITH KUB  Technique:  Routine upper GI series was performed with Omnipaque- 300.  Fluoroscopy Time: 7.16 minutes  Comparison:  No priors.  Findings: The preprocedural KUB demonstrated midline surgical staples.  Bowel gas pattern is nonobstructive.  The examination was very difficult, limited by patient mobility and lack of patient cooperation.  Accordingly, a focused examination was performed with attention directed to the duodenum.  The patient was placed supine and in a right lateral decubitus position while ingesting Omnipaque-300.  Sequential fluoroscopic images were obtained.  These demonstrated marked thickening of the folds of the duodenum, suggesting some degree of duodenal edema.  During the examination, there seemed to be a relatively limited transit of the contrast past the third portion of the duodenum.  No obstructing mass lesion was noted, and this was favored to be related to some postoperative edema.  No definite leakage of contrast outside the confines of the duodenum was identified during the examination.  IMPRESSION: 1.  No evidence of duodenal perforation at this time. 2.  Marked thickening of the duodenal folds and limited the transit of contrast past the third portion of the duodenum, likely indicative of some duodenal edema and postoperative swelling.   Original Report Authenticated By: Trudie Reed, M.D.     Anti-infectives: Anti-infectives    None      Assessment/Plan: s/p Procedure(s) (LRB) with comments: EXPLORATORY LAPAROTOMY (N/A), duodenotomy with oversewing bleeding duodenal ulcer  Cont NPO, ng not ready to be pulled today. Hopefully clamping trials Thursday Picc/tpn Pt/ot Tried to explain rationale for therapies and why ng needs to stay in.   Mary Sella. Andrey Campanile, MD, FACS General, Bariatric, & Minimally Invasive Surgery [facility], [place]   LOS: 6 days    Atilano Ina 08/15/2012

## 2012-08-15 NOTE — Progress Notes (Signed)
Spoke with medical team and then with patient about the need for a letter re: his court date on 08/23/2012. The medical team plans to attempt NGT clamping tomorrow, 08/16/2012.  If the patient tolerates this, he could potentially be discharged prior to his court date.  He may require someone else to transport him there but he would still be able to attend the court hearing.  I explained this to the patient and advised him that we would reassess next week and if he was not going to be able to attend the court date, I would absolutely handle this matter for him.  He stated understanding.

## 2012-08-16 LAB — PHOSPHORUS: Phosphorus: 4.8 mg/dL — ABNORMAL HIGH (ref 2.3–4.6)

## 2012-08-16 LAB — COMPREHENSIVE METABOLIC PANEL
Alkaline Phosphatase: 66 U/L (ref 39–117)
BUN: 17 mg/dL (ref 6–23)
Creatinine, Ser: 0.86 mg/dL (ref 0.50–1.35)
GFR calc Af Amer: >90 mL/min (ref >90)
Glucose, Bld: 114 mg/dL — ABNORMAL HIGH (ref 70–99)
Potassium: 3.9 mEq/L (ref 3.5–5.1)
Total Bilirubin: 0.3 mg/dL (ref 0.3–1.2)
Total Protein: 5.9 g/dL — ABNORMAL LOW (ref 6.0–8.3)

## 2012-08-16 LAB — CBC
MCH: 29.4 pg (ref 26.0–34.0)
MCHC: 33.9 g/dL (ref 30.0–36.0)
Platelets: 231 10*3/uL (ref 150–400)
RBC: 3.33 MIL/uL — ABNORMAL LOW (ref 4.22–5.81)

## 2012-08-16 LAB — MAGNESIUM: Magnesium: 2.1 mg/dL (ref 1.5–2.5)

## 2012-08-16 MED ORDER — FAMOTIDINE 10 MG/ML IV SOLN
INTRAVENOUS | Status: AC
  Administered 2012-08-16: 18:00:00 via INTRAVENOUS
  Filled 2012-08-16: qty 2400

## 2012-08-16 MED ORDER — HYDROMORPHONE HCL PF 1 MG/ML IJ SOLN
0.5000 mg | INTRAMUSCULAR | Status: DC | PRN
  Administered 2012-08-16 – 2012-08-19 (×3): 1 mg via INTRAVENOUS
  Filled 2012-08-16 (×3): qty 1

## 2012-08-16 NOTE — Progress Notes (Signed)
CONSULT PROGRESS NOTE  [name] KVQ:[id number] DOB: Dec 19, 1960 DOA: 08/09/2012 PCP: Default, Provider, MD  Brief narrative: This 52 year old African American male was admitted 08/09/2012. He complained of a one-week history of continuous nausea vomiting and emesis that suddenly became bloody. This is in setting of drinking 40 ounces daily of alcohol. He also describe darkening of her stool that period of time. He was admitted to step down unit and during the endoscopic procedure on 1.10 was still bleeding from a large post duodenal ulcer with blood pressure in the 70s, and an attempt was made by gastroenterologist to clip the area x2. Because of hemodynamic instability he was intubated. He went the OR emergently and had Exploratory Laprarotomy He was transfused 3 units of blood and 1 unit of FFP He was extubated on 1.11 and thought to be stable on 1.12 qand transferred back to [facility] consulting for Gen Med issues    Assessment/Plan: 1. Acute resp failure - resolved, patient is currently on room air.  3. Acute upper GI bleed from posteriorDuodenal ulcer status post Exploratory Laparotomy 1/10-doing stable. Hemoglobin has been stable over the last 4 days. Gastrografin evaluation on January 14 showed no evidence of perforation at that time.  2. FEN-Continue TPN per PICC line placed 08/12/12-CBG q4 hourly-monitor and cover c insulin if prn-sugars 104-139. Patient extremely upset today and adamant that he eats. If okay from a surgical standpoint he would not be unreasonable to try clear liquid diet.  3. Acute blood loss anemia-hemoglobin has been stable  4. Hypertension-unclear if this was because of pain. Patient was more comfortable yesterday and today and his blood pressure has been within normal limits.  5. H/o ETOH abuse - he has been hospitalized for 6 days and showed no overt signs of alcohol withdrawal.  6. Social issues-Has court date  His acute respiratory failure has resolved  patient is currently on room air. He is no longer hypertensive as his pain seems well controlled, his blood pressures are normal. His hemoglobin has been stable over the last few days showing that he is not actively bleeding. Medicine team will sign off at this time, please do not hesitate to call with questions. Thank you for this interesting consult.    Procedures:  EGD 08/10/2012  Intubated 1.10  Extubated 1.11  Exploratory laparotomy 08/09/2012-JP drain R side placed 1/10, NG tube in situ 1/10  Antibiotics:  None currently   Subjective  Patient is extremely upset with me and the treatment team about his inability to eat. Any attempts to explain his medical situation are met with resistance.   Objective   Interim History: No other issues  Telemetry: None telemetry at present  Objective: Filed Vitals:   08/16/12 0528 08/16/12 0729 08/16/12 1300 08/16/12 1404  BP: 126/76   130/77  Pulse: 77   89  Temp: 98.1 F (36.7 C)   97.5 F (36.4 C)  TempSrc: Oral   Oral  Resp: 17 12  18   Height:      Weight:      SpO2: 97% 97% 98% 97%    Intake/Output Summary (Last 24 hours) at 08/16/12 1653 Last data filed at 08/16/12 1600  Gross per 24 hour  Intake   1080 ml  Output   3626 ml  Net  -2546 ml    Exam:  Patient refused physical exam today.  Data Reviewed: Basic Metabolic Panel:  Lab 08/16/12 6433 08/15/12 0505 08/14/12 0400 08/13/12 2951 08/12/12 8841 08/11/12 6606 08/11/12 0206  NA 139 -- 139 140 -- 142 144  K 3.9 -- 3.4* -- -- -- --  CL 101 -- 101 102 -- 108 110  CO2 30 -- 32 32 -- 26 26  GLUCOSE 114* -- 124* 120* -- 104* 103*  BUN 17 -- 13 16 -- 19 19  CREATININE 0.86 -- 0.79 0.77 -- 0.96 0.98  CALCIUM 8.8 -- 8.5 8.4 -- 8.0* 8.0*  MG 2.1 2.3 2.0 2.0 2.1 -- --  PHOS 4.8* 4.2 3.1 2.6 3.6 -- --   Liver Function Tests:  Lab 08/16/12 0515 08/13/12 0640 08/11/12 0317 08/11/12 0206  AST 21 22 30 29   ALT 20 17 23 24   ALKPHOS 66 55 45 46  BILITOT 0.3 0.5 0.6  0.6  PROT 5.9* 5.2* 4.8* 4.8*  ALBUMIN 2.2* 2.2* 2.4* 2.4*   No results found for this basename: LIPASE:5,AMYLASE:5 in the last 168 hours No results found for this basename: AMMONIA:5 in the last 168 hours CBC:  Lab 08/16/12 0515 08/15/12 0505 08/14/12 0400 08/13/12 0640 08/12/12 2000 08/10/12 1530  WBC 11.8* 12.0* 13.1* 14.3* 14.6* --  NEUTROABS -- -- -- 12.3* -- 12.8*  HGB 9.8* 9.4* 9.4* 9.5* 9.6* --  HCT 28.9* 28.0* 27.4* 27.9* 28.2* --  MCV 86.8 87.5 85.9 86.9 87.3 --  PLT 231 185 140* 133* 123* --   Cardiac Enzymes:  Lab 08/11/12 2020 08/11/12 0930 08/11/12 0317 08/10/12 1534  CKTOTAL -- -- -- --  CKMB -- -- -- --  CKMBINDEX -- -- -- --  TROPONINI <0.30 <0.30 <0.30 <0.30   BNP: No components found with this basename: POCBNP:5 CBG:  Lab 08/15/12 0738 08/15/12 0426 08/14/12 2359 08/14/12 2012 08/14/12 1602  GLUCAP 114* 119* 111* 116* 104*    Recent Results (from the past 240 hour(s))  MRSA PCR SCREENING     Status: Normal   Collection Time   08/09/12  7:26 PM      Component Value Range Status Comment   MRSA by PCR NEGATIVE  NEGATIVE Final      Studies:              All Imaging reviewed and is as per above notation   Scheduled Meds:    . antiseptic oral rinse  15 mL Mouth Rinse q12n4p  . chlorhexidine  15 mL Mouth Rinse BID   Continuous Infusions:    . sodium chloride 20 mL/hr at 08/16/12 0727  . TPN (CLINIMIX) +/- additives 100 mL/hr at 08/15/12 1706   And  . fat emulsion 250 mL (08/15/12 1706)  . TPN (CLINIMIX) +/- additives        Code Status: Full Family Communication: none at bedside Disposition Plan: Per general surgery-[facility] will follow along with you.  Might need SNF per PT note   Carlyle Achenbach M. Elvera Lennox, MD [facility] Pager: [phone] Please contact night coverage between 7 p.m. - 7 a.m. ([email or web address]) 08/16/2012, 4:53 PM  LOS: 7 days

## 2012-08-16 NOTE — Progress Notes (Signed)
6 Days Post-Op  Subjective: The patient is feeling improved today, still sedated, but pain well controlled.  Pt denies nausea, no flatus.  Small BM on Tues, none since.  Pt is ambulating with PT/OT well.  Pt's NG tube somehow backed its way out and was no longer in the stomach.  Pt denies pulling it out.    Objective: Vital signs in last 24 hours: Temp:  [97.9 F (36.6 C)-98.8 F (37.1 C)] 98.1 F (36.7 C) (01/16 0528) Pulse Rate:  [77-84] 77  (01/16 0528) Resp:  [12-20] 12  (01/16 0729) BP: (126-152)/(65-84) 126/76 mmHg (01/16 0528) SpO2:  [97 %-100 %] 97 % (01/16 0729) Last BM Date: 08/14/12  Intake/Output from previous day: 01/15 0701 - 01/16 0700 In: 1220 [I.V.:360; NG/GT:60; TPN:800] Out: 3830 [Urine:3200; Emesis/NG output:600; Drains:30] Intake/Output this shift: Total I/O In: -  Out: 60 [Drains:60]  PE: Gen:  Alert, NAD, pleasant Card:  RRR, no M/G/R heard Pulm:  CTA, no W/R/R Abd: Soft, minimally tender in upper quadrants, ND, +BS, no HSM, incisions C/D/I, drain with minimal sanguinous drainage   Lab Results:   Basename 08/16/12 0515 08/15/12 0505  WBC 11.8* 12.0*  HGB 9.8* 9.4*  HCT 28.9* 28.0*  PLT 231 185   BMET  Basename 08/16/12 0515 08/14/12 0400  NA 139 139  K 3.9 3.4*  CL 101 101  CO2 30 32  GLUCOSE 114* 124*  BUN 17 13  CREATININE 0.86 0.79  CALCIUM 8.8 8.5   PT/INR No results found for this basename: LABPROT:2,INR:2 in the last 72 hours CMP     Component Value Date/Time   NA 139 08/16/2012 0515   K 3.9 08/16/2012 0515   CL 101 08/16/2012 0515   CO2 30 08/16/2012 0515   GLUCOSE 114* 08/16/2012 0515   BUN 17 08/16/2012 0515   CREATININE 0.86 08/16/2012 0515   CALCIUM 8.8 08/16/2012 0515   PROT 5.9* 08/16/2012 0515   ALBUMIN 2.2* 08/16/2012 0515   AST 21 08/16/2012 0515   ALT 20 08/16/2012 0515   ALKPHOS 66 08/16/2012 0515   BILITOT 0.3 08/16/2012 0515   GFRNONAA >90 08/16/2012 0515   GFRAA >90 08/16/2012 0515   Lipase     Component Value  Date/Time   LIPASE 31 08/09/2012 1356       Studies/Results: Dg Abd 1 View  08/14/2012  *RADIOLOGY REPORT*  Clinical Data: NG placement  ABDOMEN - 1 VIEW  Comparison: 08/09/2012  Findings: NG tube is in the gastric antrum.  Midline skin staples from recent laparotomy.  IMPRESSION: NG tube in the gastric antrum.   Original Report Authenticated By: Janeece Riggers, M.D.    Dg Naso G Tube Plc W/fl-no Rad  08/14/2012  CLINICAL DATA: placement of NG tube   NASO G TUBE PLACEMENT WITH FLUORO  Fluoroscopy was utilized by the requesting physician.  No radiographic  interpretation.     Dg Ugi W/water Sol Cm  08/14/2012  *RADIOLOGY REPORT*  Clinical Data:  Evaluate for possible duodenal leak.  Perforating duodenal ulcer status post surgical repair.  UPPER GI SERIES WITH KUB  Technique:  Routine upper GI series was performed with Omnipaque- 300.  Fluoroscopy Time: 7.16 minutes  Comparison:  No priors.  Findings: The preprocedural KUB demonstrated midline surgical staples.  Bowel gas pattern is nonobstructive.  The examination was very difficult, limited by patient mobility and lack of patient cooperation.  Accordingly, a focused examination was performed with attention directed to the duodenum.  The patient was placed supine  and in a right lateral decubitus position while ingesting Omnipaque-300.  Sequential fluoroscopic images were obtained.  These demonstrated marked thickening of the folds of the duodenum, suggesting some degree of duodenal edema.  During the examination, there seemed to be a relatively limited transit of the contrast past the third portion of the duodenum.  No obstructing mass lesion was noted, and this was favored to be related to some postoperative edema.  No definite leakage of contrast outside the confines of the duodenum was identified during the examination.  IMPRESSION: 1.  No evidence of duodenal perforation at this time. 2.  Marked thickening of the duodenal folds and limited the transit  of contrast past the third portion of the duodenum, likely indicative of some duodenal edema and postoperative swelling.   Original Report Authenticated By: Trudie Reed, M.D.     Anti-infectives: Anti-infectives    None       Assessment/Plan POD #5 s/p Ex Lap with oversew of bleeding duodenal artery and ulcer  -Urinating on own , pain much improved today -Cont PICC line and TPN, NG tube discontinued because it was nearly out of the patients nose this AM, strict NPO!!! -Will do gastrograph study through NG tube to ensure no duodenal leak at surgery site prior to starting clears  Confusion - much improved today, will d/c PCA to see if he continues to improve ABL Anemia - stable  HTN - medicine following  Duodenal ulcer  ETOH Abuse - CIWA protocol VTE - SCD's, no lovenox due to bleeding, ambulation TID at least FEN - TPN, IVF, may be able to start fluids late today or tomorrow if tolerates NG out today DISP - Hopefully start progressing diet soon and weaning off TPN, when on PO's   Appreciate IM Hospitalist staying on as consult, will take him on as primary   LOS: 7 days    Frazier, Ryan Schlafer 08/16/2012, 7:50 AM Pager: [phone]

## 2012-08-16 NOTE — Progress Notes (Signed)
PARENTERAL NUTRITION CONSULT NOTE - Follow up  Pharmacy Consult for TPN Indication: Bleeding duodenal ulcer  No Known Allergies  Patient Measurements: Height: 5\' 9"  (175.3 cm) Weight: 173 lb 4.5 oz (78.6 kg) IBW/kg (Calculated) : 70.7   Vital Signs: Temp: 98.1 F (36.7 C) (01/16 0528) Temp src: Oral (01/16 0528) BP: 126/76 mmHg (01/16 0528) Pulse Rate: 77  (01/16 0528) Intake/Output from previous day: 01/15 0701 - 01/16 0700 In: 1220 [I.V.:360; NG/GT:60; TPN:800] Out: 3830 [Urine:3200; Emesis/NG output:600; Drains:30] Intake/Output from this shift: Total I/O In: 0  Out: 285 [Urine:225; Drains:60]  Labs:  [facility] 08/16/12 0515 08/15/12 0505 08/14/12 0400  WBC 11.8* 12.0* 13.1*  HGB 9.8* 9.4* 9.4*  HCT 28.9* 28.0* 27.4*  PLT 231 185 140*  APTT -- -- --  INR -- -- --     Basename 08/16/12 0515 08/15/12 0505 08/14/12 0400  NA 139 -- 139  K 3.9 -- 3.4*  CL 101 -- 101  CO2 30 -- 32  GLUCOSE 114* -- 124*  BUN 17 -- 13  CREATININE 0.86 -- 0.79  LABCREA -- -- --  CREAT24HRUR -- -- --  CALCIUM 8.8 -- 8.5  MG 2.1 2.3 2.0  PHOS 4.8* 4.2 3.1  PROT 5.9* -- --  ALBUMIN 2.2* -- --  AST 21 -- --  ALT 20 -- --  ALKPHOS 66 -- --  BILITOT 0.3 -- --  BILIDIR -- -- --  IBILI -- -- --  PREALBUMIN -- -- --  TRIG -- -- --  CHOLHDL -- -- --  CHOL -- -- --   Estimated Creatinine Clearance: 100.5 ml/min (by C-G formula based on Cr of 0.86).    Basename 08/15/12 0738 08/15/12 0426 08/14/12 2359  GLUCAP 114* 119* 111*    Medical History: Past Medical History  Diagnosis Date  . Abdominal  pain, other specified site   . GERD (gastroesophageal reflux disease)   . Abnormal CT scan, esophagus 03/2012  . Substance abuse 03/2012    tox screen positive for cocaine.     Medications:  No prescriptions prior to admission    Insulin Requirements in the past 24 hours:  No insulin required  Current Nutrition:  Clinimix E 5/15 at 148ml/hr and Intralipid 20% 31ml/hr  MWF  Nutritional Goals per RD:  1650-1750 kCal, 110-120 grams of protein per day  Assessment:  Admit: Mr. Redmann was admitted with hematemesis and Hgb of 5 on admit. He underwent EGD which revealed active bleeding duodenal artery from a duodenal ulcer, which could not be controlled endoscopically. Patient is now s/p urgent surgical repair of his bleeding duodenal ulcer (1/10).   GI: NGT has ben dislodged but patient remains npo. Feeling better. pepcid added to tna. Continues to tolerate TPN at goal rate. Endo: No prior hx of DM or other endocrine disorders. Not currently on insulin supplementation. Lytes: lytes wnl, phos elevated, will watch Renal: Scr nml, UOP is good at 1.1 ml/kg/hr. I/O not accurately being measured yest. Pulm: 96% sat on ra Cards: VSS Hepatobil: Alb low at 2.4, LFTs otherwise nml. Baseline prealbumin 10.1 Neuro: WNL, conversant, b ID: Afeb, WBC elevated at 13, PCT 0.15. No current abx, received Ancef perioperatively. Best Practices: SCDs, h2ra TPN Access: DL PICC line right basilic TPN day#: 5  Plan:  Continue TPN tat 178ml/hr to give slightly above goal for calories. Patient will receive estimated 120g protein and 1909 kcal per day based upon 3 times weekly lipid supplementation. - Will supplement MVI, trace elements and IV  fats on MWF due to ongoing national shortages. -Pepcid 40mg  IV in tna daily - IVF at kvo - Will f/up TPN labs in AM  [place], PharmD, [place] Pager [phone] 08/16/2012 11:00 AM

## 2012-08-17 LAB — CBC
Platelets: 276 10*3/uL (ref 150–400)
RBC: 3.5 MIL/uL — ABNORMAL LOW (ref 4.22–5.81)
RDW: 13.8 % (ref 11.5–15.5)
WBC: 10.5 10*3/uL (ref 4.0–10.5)

## 2012-08-17 LAB — PHOSPHORUS: Phosphorus: 4.6 mg/dL (ref 2.3–4.6)

## 2012-08-17 MED ORDER — TRACE MINERALS CR-CU-F-FE-I-MN-MO-SE-ZN IV SOLN
INTRAVENOUS | Status: DC
  Administered 2012-08-17: 17:00:00 via INTRAVENOUS
  Filled 2012-08-17: qty 2400

## 2012-08-17 MED ORDER — FAT EMULSION 20 % IV EMUL
250.0000 mL | INTRAVENOUS | Status: DC
  Administered 2012-08-17: 250 mL via INTRAVENOUS
  Filled 2012-08-17: qty 250

## 2012-08-17 MED ORDER — BOOST / RESOURCE BREEZE PO LIQD
1.0000 | ORAL | Status: DC
  Administered 2012-08-18 – 2012-08-19 (×2): 1 via ORAL

## 2012-08-17 NOTE — Progress Notes (Signed)
NUTRITION FOLLOW UP  Intervention:   1. TPN per pharmacy.  2. Resource Breeze po daily, each supplement provides 250 kcal and 9 grams of protein. 3. RD to continue to follow nutrition care plan.  Nutrition Dx:   Inadequate oral intake related to inability to eat as evidenced by NPO status. Ongoing.  Goal:   Pt to meet >/= 90% of their estimated nutrition needs; met.  Monitor:   TPN adequacy, transition to oral diet vs EN, weights, labs, I/O's  Assessment:   7 days Post-Op: EXPLORATORY LAPAROTOMY (N/A), duodenotomy with oversewing bleeding duodenal ulcer.  Pt stated on sips of non-caffeinated and non-acidic liquids today. Pt had BM and flatus yesterday. Pt reports that he is able to take his clear liquids without any issues at this time, states that he has had broth, juices, and clear sodas.  Patient is receiving TPN with Clinimix E 5/15 @ 100 ml/hr. Lipids (20% IVFE @ 10 ml/hr), multivitamins, and trace elements are provided 3 times weekly (MWF) due to national backorder. This provides 1909 kcal and 120 grams protein daily (based on weekly average).  Meets 100% minimum estimated kcal and 100% minimum estimated protein needs. Noted pharmacist concern for moderate risk for refeeding syndrome. Current potassium, magnesium and phosphorus WNL.  Height: Ht Readings from Last 1 Encounters:  08/09/12 5\' 9"  (1.753 m)    Weight Status:   Wt Readings from Last 1 Encounters:  08/12/12 173 lb 4.5 oz (78.6 kg)    Re-estimated needs:  Kcal: 1800 - 2100 Protein: 100 - 120 grams Fluid: 1.8 - 2 liters daily  Skin: intact  Diet Order: Clear Liquid   Intake/Output Summary (Last 24 hours) at 08/17/12 1144 Last data filed at 08/17/12 0700  Gross per 24 hour  Intake   1040 ml  Output   1851 ml  Net   -811 ml    Last BM: 1/16   Labs:   Lab 08/17/12 0600 08/16/12 0515 08/15/12 0505 08/14/12 0400 08/13/12 0640  NA -- 139 -- 139 140  K -- 3.9 -- 3.4* 3.7  CL -- 101 -- 101 102  CO2  -- 30 -- 32 32  BUN -- 17 -- 13 16  CREATININE -- 0.86 -- 0.79 0.77  CALCIUM -- 8.8 -- 8.5 8.4  MG 2.1 2.1 2.3 -- --  PHOS 4.6 4.8* 4.2 -- --  GLUCOSE -- 114* -- 124* 120*    CBG (last 3)   Basename 08/15/12 0738 08/15/12 0426 08/14/12 2359  GLUCAP 114* 119* 111*    Scheduled Meds:    . antiseptic oral rinse  15 mL Mouth Rinse q12n4p  . chlorhexidine  15 mL Mouth Rinse BID    Continuous Infusions:    . sodium chloride 20 mL/hr at 08/16/12 0727  . TPN (CLINIMIX) +/- additives     And  . fat emulsion    . TPN [facility]) +/- additives 100 mL/hr at 08/16/12 1803    [name] MS, RD, LDN Pager: [phone] After-hours pager: [phone]

## 2012-08-17 NOTE — Progress Notes (Signed)
7 Days Post-Op  Subjective: Pt feeling great, very alert today.  Pt denies any pain even with palpation.  BM yesterday, +flatus, urinating on own.  Ambulating with PT well.  Starting to do normal ADL's on own.    Objective: Vital signs in last 24 hours: Temp:  [97.5 F (36.4 C)-99 F (37.2 C)] 98.8 F (37.1 C) (01/17 0542) Pulse Rate:  [79-89] 79  (01/17 0542) Resp:  [16-18] 18  (01/17 0542) BP: (117-133)/(58-80) 117/58 mmHg (01/17 0542) SpO2:  [97 %-98 %] 98 % (01/17 0542) Last BM Date: 08/16/12  Intake/Output from previous day: 01/16 0701 - 01/17 0700 In: 1040 [I.V.:160; TPN:880] Out: 2136 [Urine:1975; Drains:159; Stool:2] Intake/Output this shift:    PE: Gen: Alert, NAD, pleasant  Card: RRR, no M/G/R heard  Pulm: CTA, no W/R/R  Abd: Soft, minimally tender in upper quadrants, ND, +BS, no HSM, incisions C/D/I, drain with minimal sanguinous drainage   Lab Results:   Basename 08/17/12 0600 08/16/12 0515  WBC 10.5 11.8*  HGB 10.3* 9.8*  HCT 29.8* 28.9*  PLT 276 231   BMET  Basename 08/16/12 0515  NA 139  K 3.9  CL 101  CO2 30  GLUCOSE 114*  BUN 17  CREATININE 0.86  CALCIUM 8.8   PT/INR No results found for this basename: LABPROT:2,INR:2 in the last 72 hours CMP     Component Value Date/Time   NA 139 08/16/2012 0515   K 3.9 08/16/2012 0515   CL 101 08/16/2012 0515   CO2 30 08/16/2012 0515   GLUCOSE 114* 08/16/2012 0515   BUN 17 08/16/2012 0515   CREATININE 0.86 08/16/2012 0515   CALCIUM 8.8 08/16/2012 0515   PROT 5.9* 08/16/2012 0515   ALBUMIN 2.2* 08/16/2012 0515   AST 21 08/16/2012 0515   ALT 20 08/16/2012 0515   ALKPHOS 66 08/16/2012 0515   BILITOT 0.3 08/16/2012 0515   GFRNONAA >90 08/16/2012 0515   GFRAA >90 08/16/2012 0515   Lipase     Component Value Date/Time   LIPASE 31 08/09/2012 1356     Assessment/Plan POD #6 s/p Ex Lap with oversew of bleeding duodenal artery and ulcer  -Urinating on own , pain nearly gone -Cont PICC line and TPN, will start  sips of non-caffenated, non-acidic liquids today Confusion - much improved today, will d/c PCA to see if he continues to improve  ABL Anemia - much improved HTN - medicine following  Duodenal ulcer  ETOH Abuse - CIWA protocol  VTE - SCD's, no lovenox due to bleeding, ambulation TID at least  FEN - TPN, IVF, may be able to start fluid diet tomorrow if tolerating sips today DISP - Hopefully start progressing diet soon and weaning off TPN, when on PO's, PT/OT recommending HH PT-mon/tues if all goes well  Appreciate IM Hospitalist staying on as consult, will take him on as primary  Talked to sister at bedside with patient with a patient update Cell [phone]) and home [phone])    LOS: 8 days    DORT, Kolette Vey 08/17/2012, 8:05 AM Pager: [phone]

## 2012-08-17 NOTE — Progress Notes (Signed)
Occupational Therapy Treatment and Discharge  Patient Details Name: [name] MRN: [id number] DOB: November 10, 1960 Today's Date: 08/17/2012 Time: 1030-1106 OT Time Calculation (min): 36 min  OT Assessment / Plan / Recommendation Comments on Treatment Session Pt has dramatically improved in activity tolerance, balance, cognition, and ADL independence since last visit on Wednesday.  He ambulated around his room, bathroom, and the nursing unit pushing his own IV pole safely. No further OT needs.    Follow Up Recommendations  No OT follow up;Supervision - Intermittent    Barriers to Discharge       Equipment Recommendations  None recommended by OT    Recommendations for Other Services    Frequency Min 2X/week   Plan Discharge plan needs to be updated    Precautions / Restrictions Precautions Precautions: None Precaution Comments: pt is safely ambulating with IV pole Restrictions Weight Bearing Restrictions: No   Pertinent Vitals/Pain No pain    ADL  Eating/Feeding: Independent (clear liquids) Where Assessed - Eating/Feeding: Edge of bed Grooming: Modified independent;Wash/dry hands;Wash/dry face Where Assessed - Grooming: Unsupported standing Upper Body Bathing: Independent Where Assessed - Upper Body Bathing: Unsupported standing Lower Body Bathing: Modified independent Where Assessed - Lower Body Bathing: Unsupported standing;Unsupported sitting Upper Body Dressing: Independent Where Assessed - Upper Body Dressing: Unsupported sitting Lower Body Dressing: Independent Where Assessed - Lower Body Dressing: Unsupported sit to stand Toilet Transfer: Independent Toilet Transfer Method: Sit to Barista: Comfort height toilet Toileting - Clothing Manipulation and Hygiene: Independent Where Assessed - Toileting Clothing Manipulation and Hygiene: Sit to stand from 3-in-1 or toilet Transfers/Ambulation Related to ADLs: mod I (holding IV pole) ADL Comments: Pt  able to perform ADL independently now that his lines have dramatically decreased.    OT Diagnosis:    OT Problem List:   OT Treatment Interventions:     OT Goals ADL Goals Pt Will Perform Grooming: Standing at sink;Independently ADL Goal: Grooming - Progress: Met Pt Will Perform Upper Body Bathing: Sitting at sink;with modified independence ADL Goal: Upper Body Bathing - Progress: Met Pt Will Perform Lower Body Bathing: Sit to stand from chair;Sitting at sink;with modified independence ADL Goal: Lower Body Bathing - Progress: Met Pt Will Perform Upper Body Dressing: Independently;Sitting, chair ADL Goal: Upper Body Dressing - Progress: Met Pt Will Perform Lower Body Dressing: Sit to stand from bed;with modified independence ADL Goal: Lower Body Dressing - Progress: Met Pt Will Transfer to Toilet: Independently;Ambulation;Comfort height toilet ADL Goal: Toilet Transfer - Progress: Met Pt Will Perform Toileting - Clothing Manipulation: Independently;Standing ADL Goal: Toileting - Clothing Manipulation - Progress: Met Pt Will Perform Toileting - Hygiene: Independently;Sit to stand from 3-in-1/toilet ADL Goal: Toileting - Hygiene - Progress: Met Miscellaneous OT Goals Miscellaneous OT Goal #1: Pt will gather necessary items to perform ADL independently. OT Goal: Miscellaneous Goal #1 - Progress: Met  Visit Information  Last OT Received On: 08/17/12 Assistance Needed: +1    Subjective Data      Prior Functioning       Cognition  Overall Cognitive Status: Appears within functional limits for tasks assessed/performed Arousal/Alertness: Awake/alert Orientation Level: Appears intact for tasks assessed Behavior During Session: [facility] for tasks performed    Mobility  Shoulder Instructions Bed Mobility Bed Mobility: Supine to Sit Rolling Right: 7: Independent Transfers Transfers: Sit to Stand;Stand to Sit Sit to Stand: With upper extremity assist;7: Independent;From bed;From  toilet Stand to Sit: 7: Independent;With upper extremity assist;To bed;To chair/3-in-1;To toilet  Exercises      Balance     End of Session OT - End of Session Equipment Utilized During Treatment: Gait belt Activity Tolerance: Patient tolerated treatment well Patient left: in chair;with call bell/phone within reach Nurse Communication: Other (comment) (pt safely ambulates with IV pole)  GO     Evern Bio 08/17/2012, 11:15 AM [phone]

## 2012-08-17 NOTE — Progress Notes (Signed)
PARENTERAL NUTRITION CONSULT NOTE - Follow up  Pharmacy Consult for TPN Indication: Bleeding duodenal ulcer  No Known Allergies  Patient Measurements: Height: 5\' 9"  (175.3 cm) Weight: 173 lb 4.5 oz (78.6 kg) IBW/kg (Calculated) : 70.7   Vital Signs: Temp: 98.8 F (37.1 C) (01/17 0542) Temp src: Oral (01/17 0542) BP: 117/58 mmHg (01/17 0542) Pulse Rate: 79  (01/17 0542) Intake/Output from previous day: 01/16 0701 - 01/17 0700 In: 1040 [I.V.:160; TPN:880] Out: 2136 [Urine:1975; Drains:159; Stool:2] Intake/Output from this shift:    Labs:  [facility] 08/17/12 0600 08/16/12 0515 08/15/12 0505  WBC 10.5 11.8* 12.0*  HGB 10.3* 9.8* 9.4*  HCT 29.8* 28.9* 28.0*  PLT 276 231 185  APTT -- -- --  INR -- -- --     Basename 08/17/12 0600 08/16/12 0515 08/15/12 0505  NA -- 139 --  K -- 3.9 --  CL -- 101 --  CO2 -- 30 --  GLUCOSE -- 114* --  BUN -- 17 --  CREATININE -- 0.86 --  LABCREA -- -- --  CREAT24HRUR -- -- --  CALCIUM -- 8.8 --  MG 2.1 2.1 2.3  PHOS 4.6 4.8* 4.2  PROT -- 5.9* --  ALBUMIN -- 2.2* --  AST -- 21 --  ALT -- 20 --  ALKPHOS -- 66 --  BILITOT -- 0.3 --  BILIDIR -- -- --  IBILI -- -- --  PREALBUMIN -- -- --  TRIG -- -- --  CHOLHDL -- -- --  CHOL -- -- --   Estimated Creatinine Clearance: 100.5 ml/min (by C-G formula based on Cr of 0.86).    Basename 08/15/12 0738 08/15/12 0426 08/14/12 2359  GLUCAP 114* 119* 111*   Insulin Requirements in the past 24 hours:  No insulin required - SSI d/c'd  Current Nutrition:  Clinimix E 5/15 at 132ml/hr and Intralipid 20% 48ml/hr MWF  Nutritional Goals per RD:  1800-2100 kCal, 100-120 grams of protein per day  Assessment: Admit: [name] was admitted with hematemesis and Hgb of 5 on admit. He underwent EGD which revealed active bleeding duodenal artery from a duodenal ulcer, which could not be controlled endoscopically. Patient is now s/p urgent surgical repair of his bleeding duodenal ulcer (1/10).    GI: NGT out 1/16. Pt wants to eat. Noted surgery note states may be able to start liquids if tolerate NGT out. Pepcid in tna. Continues to tolerate TPN at goal rate. Endo: No prior hx of DM or other endocrine disorders. Not currently on insulin supplementation. Lytes: lytes wnl Renal: Scr nml, UOP is good at 1 ml/kg/hr. I/O not accurately being recorded. Cards: VSS Hepatobil: Alb low at 2.2, LFTs otherwise nml. Baseline prealbumin 10.1 ID: Afeb, WBC wnl, PCT 0.15. No current abx, received Ancef perioperatively. Best Practices: SCDs, Pepcid in TPN TPN Access: DL PICC line right basilic TPN day#: 6  Plan:  1) Continue TPN at 118ml/hr with lipids 45ml/hr on MWF to provide 100% estimated needs. 2) Will supplement MVI, trace elements and IV fats on MWF due to ongoing Publishing copy. 3) Pepcid 40mg  IV in TPN daily 4) Will f/up ability to start po liquids  Christoper Fabian, PharmD, BCPS Clinical pharmacist, pager [phone] 08/17/2012 7:39 AM

## 2012-08-17 NOTE — Progress Notes (Signed)
Physical Therapy Treatment Patient Details Name: [name] MRN: [id number] DOB: 02-03-1961 Today's Date: 08/17/2012 Time: 4098-1191 PT Time Calculation (min): 25 min  PT Assessment / Plan / Recommendation Comments on Treatment Session  Patient demonstrates significant improvements in functional mobility at this time. Feel patient will be safe for d/c home from a mobility standpoint. No PT follow up recommended.  Patient able to increase ambulation without difficulty.  Spoke with patient at length regarding expectations for mobility. Patient did express concerns that hospital has no located the clothing that he had when he came in.      Follow Up Recommendations        Does the patient have the potential to tolerate intense rehabilitation     Barriers to Discharge        Equipment Recommendations       Recommendations for Other Services    Frequency Min 3X/week   Plan Discharge plan remains appropriate    Precautions / Restrictions Precautions Precautions: None Precaution Comments: pt is safely ambulating with IV pole   Pertinent Vitals/Pain Pt reports no pain at this time     Mobility  Bed Mobility Bed Mobility: Supine to Sit Rolling Right: 7: Independent Transfers Transfers: Sit to Stand;Stand to Sit Sit to Stand: With upper extremity assist;7: Independent;From bed;From toilet Stand to Sit: 7: Independent;With upper extremity assist;To bed;To chair/3-in-1;To toilet Ambulation/Gait Ambulation/Gait Assistance: 5: Supervision Ambulation Distance (Feet): 400 Feet Assistive device:  (iv pole) Gait Pattern: Within Functional Limits General Gait Details: patient much more fluid with ambulation     PT Goals Acute Rehab PT Goals PT Goal: Sit to Stand - Progress: Met PT Goal: Stand to Sit - Progress: Met PT Goal: Ambulate - Progress: Progressing toward goal  Visit Information  Last PT Received On: 08/17/12 Assistance Needed: +1    Subjective Data  Subjective: They  can't find my clothes Patient Stated Goal: to go home   Cognition  Overall Cognitive Status: Appears within functional limits for tasks assessed/performed Arousal/Alertness: Awake/alert Orientation Level: Appears intact for tasks assessed Behavior During Session: [facility] for tasks performed    Balance  Balance Balance Assessed: Yes Dynamic Sitting Balance Dynamic Sitting - Balance Support: No upper extremity supported;Feet supported Dynamic Sitting - Level of Assistance: 7: Independent Dynamic Standing Balance Dynamic Standing - Balance Support: No upper extremity supported;During functional activity Dynamic Standing - Level of Assistance: 7: Independent  End of Session PT - End of Session Equipment Utilized During Treatment: Gait belt Activity Tolerance: Patient tolerated treatment well Patient left: in chair;with call bell/phone within reach;with family/visitor present Nurse Communication: Mobility status   GP     [name] 08/17/2012, 3:38 PM Charlotte Crumb, PT DPT  [phone]

## 2012-08-17 NOTE — Progress Notes (Signed)
CSW referred to see patient- needs letter for court appearance next week. Per RNCM note- she is following patient and states that she will facilitate this next week should patient still be in the hospital and require court note. No further CSW needs identified. CSW will sign off for now but will be available if needed to assist.  Lupita Leash T. [place] Pugh  [phone] (coverage)

## 2012-08-18 DIAGNOSIS — E46 Unspecified protein-calorie malnutrition: Secondary | ICD-10-CM

## 2012-08-18 LAB — CBC
Platelets: 324 10*3/uL (ref 150–400)
RDW: 13.9 % (ref 11.5–15.5)
WBC: 11 10*3/uL — ABNORMAL HIGH (ref 4.0–10.5)

## 2012-08-18 LAB — PHOSPHORUS: Phosphorus: 4.1 mg/dL (ref 2.3–4.6)

## 2012-08-18 LAB — MAGNESIUM: Magnesium: 2.2 mg/dL (ref 1.5–2.5)

## 2012-08-18 MED ORDER — OXYCODONE-ACETAMINOPHEN 5-325 MG PO TABS
1.0000 | ORAL_TABLET | ORAL | Status: DC | PRN
  Administered 2012-08-19 – 2012-08-20 (×4): 2 via ORAL
  Filled 2012-08-18 (×5): qty 2

## 2012-08-18 MED ORDER — TRACE MINERALS CR-CU-F-FE-I-MN-MO-SE-ZN IV SOLN
INTRAVENOUS | Status: DC
  Filled 2012-08-18: qty 2400

## 2012-08-18 MED ORDER — FAMOTIDINE 10 MG/ML IV SOLN
INTRAVENOUS | Status: DC
  Administered 2012-08-18: 18:00:00 via INTRAVENOUS
  Filled 2012-08-18 (×2): qty 2000

## 2012-08-18 MED ORDER — FAMOTIDINE 10 MG/ML IV SOLN
INTRAVENOUS | Status: DC
  Filled 2012-08-18: qty 2400

## 2012-08-18 MED ORDER — FAT EMULSION 20 % IV EMUL
250.0000 mL | INTRAVENOUS | Status: AC
  Filled 2012-08-18 (×2): qty 250

## 2012-08-18 NOTE — Progress Notes (Signed)
Patient ID: [name], male   DOB: [birth year], 52 y.o.   MRN: [id number] 8 Days Post-Op  Subjective: Pt feels well.  Tolerating clear liquids without any nausea or increase in pain.  He is passing flatus and having BMs.  Objective: Vital signs in last 24 hours: Temp:  [98.2 F (36.8 C)-99.1 F (37.3 C)] 99.1 F (37.3 C) (01/18 0456) Pulse Rate:  [80-101] 86  (01/18 0456) Resp:  [16-19] 16  (01/18 0456) BP: (107-133)/(64-73) 107/64 mmHg (01/18 0456) SpO2:  [98 %-100 %] 98 % (01/18 0456) Last BM Date: 08/17/12  Intake/Output from previous day: 01/17 0701 - 01/18 0700 In: 3630.8 [P.O.:600; I.V.:421.7; TPN:2609.2] Out: 1055 [Urine:1000; Drains:55] Intake/Output this shift:    PE: Abd: soft, minimally tender, drain with serous output, incision c/d/i with staples  Lab Results:   Basename 08/18/12 0440 08/17/12 0600  WBC 11.0* 10.5  HGB 10.7* 10.3*  HCT 31.4* 29.8*  PLT 324 276   BMET  Basename 08/16/12 0515  NA 139  K 3.9  CL 101  CO2 30  GLUCOSE 114*  BUN 17  CREATININE 0.86  CALCIUM 8.8   PT/INR No results found for this basename: LABPROT:2,INR:2 in the last 72 hours CMP     Component Value Date/Time   NA 139 08/16/2012 0515   K 3.9 08/16/2012 0515   CL 101 08/16/2012 0515   CO2 30 08/16/2012 0515   GLUCOSE 114* 08/16/2012 0515   BUN 17 08/16/2012 0515   CREATININE 0.86 08/16/2012 0515   CALCIUM 8.8 08/16/2012 0515   PROT 5.9* 08/16/2012 0515   ALBUMIN 2.2* 08/16/2012 0515   AST 21 08/16/2012 0515   ALT 20 08/16/2012 0515   ALKPHOS 66 08/16/2012 0515   BILITOT 0.3 08/16/2012 0515   GFRNONAA >90 08/16/2012 0515   GFRAA >90 08/16/2012 0515   Lipase     Component Value Date/Time   LIPASE 31 08/09/2012 1356       Studies/Results: No results found.  Anti-infectives: Anti-infectives    None       Assessment/Plan  1. UGI bleed secondary to duodenal ulcer 2. S/p ex lap with large duodenotomy and oversew of bleeding vessel 3. Polysubstance abuse 4. Post op  ileus, resolving 5. PCM/TNA  Plan: 1. Advance to full liquids and start oral pain meds  2. Start to wean TNA 3. Hopefully home by early next week, maybe Monday?  LOS: 9 days    Glynn Freas E 08/18/2012, 8:32 AM Pager: [phone]

## 2012-08-18 NOTE — Progress Notes (Signed)
Agree with A&P of KO,PA Patient eating well, abd benign

## 2012-08-18 NOTE — Progress Notes (Addendum)
PARENTERAL NUTRITION CONSULT NOTE - Follow up  Pharmacy Consult for TPN Indication: Bleeding duodenal ulcer  No Known Allergies  Patient Measurements: Height: 5\' 9"  (175.3 cm) Weight: 173 lb 4.5 oz (78.6 kg) IBW/kg (Calculated) : 70.7   Vital Signs: Temp: 99.1 F (37.3 C) (01/18 0456) BP: 107/64 mmHg (01/18 0456) Pulse Rate: 86  (01/18 0456) Intake/Output from previous day: 01/17 0701 - 01/18 0700 In: 3630.8 [P.O.:600; I.V.:421.7; TPN:2609.2] Out: 1055 [Urine:1000; Drains:55] Intake/Output from this shift:    Labs:  Basename 08/18/12 0440 08/17/12 0600 08/16/12 0515  WBC 11.0* 10.5 11.8*  HGB 10.7* 10.3* 9.8*  HCT 31.4* 29.8* 28.9*  PLT 324 276 231  APTT -- -- --  INR -- -- --     Basename 08/18/12 0440 08/17/12 0600 08/16/12 0515  NA -- -- 139  K -- -- 3.9  CL -- -- 101  CO2 -- -- 30  GLUCOSE -- -- 114*  BUN -- -- 17  CREATININE -- -- 0.86  LABCREA -- -- --  CREAT24HRUR -- -- --  CALCIUM -- -- 8.8  MG 2.2 2.1 2.1  PHOS 4.1 4.6 4.8*  PROT -- -- 5.9*  ALBUMIN -- -- 2.2*  AST -- -- 21  ALT -- -- 20  ALKPHOS -- -- 66  BILITOT -- -- 0.3  BILIDIR -- -- --  IBILI -- -- --  PREALBUMIN -- -- --  TRIG -- -- --  CHOLHDL -- -- --  CHOL -- -- --   Estimated Creatinine Clearance: 100.5 ml/min (by C-G formula based on Cr of 0.86).   No results found for this basename: GLUCAP:3 in the last 72 hours  Insulin Requirements in the past 24 hours:  No insulin required - SSI d/c'd  Current Nutrition:  Clinimix E 5/15 at 180ml/hr and Intralipid 20% 57ml/hr MWF. Pt also taking some clear liquids  Nutritional Goals per RD:  1800-2100 kCal, 100-120 grams of protein per day  Assessment: Admit: [name] was admitted with hematemesis and Hgb of 5 on admit. He underwent EGD which revealed active bleeding duodenal artery from a duodenal ulcer, which could not be controlled endoscopically. s/p urgent surgical repair of his bleeding duodenal ulcer (1/10).   GI: Pt  tolerating clear liquids - noted plan to advance to full liquids soon. Pt with BM and flatus yesterday. Pepcid in tna. Continues to tolerate TPN at goal rate. Endo: No prior hx of DM or other endocrine disorders. Not currently on insulin supplementation. Lytes: lytes wnl Renal: Scr nml. I/O not accurately being recorded. Cards: VSS Hepatobil: Alb low at 2.2, LFTs otherwise nml. Baseline prealbumin 10.1 Best Practices: SCDs, Pepcid in TPN TPN Access: DL PICC line right basilic TPN day#: 7  Plan:  1) Continue TPN at 161ml/hr with lipids 47ml/hr on MWF to provide 100% estimated needs. 2) Will supplement MVI, trace elements and IV fats on MWF due to ongoing Publishing copy. 3) Pepcid 40mg  IV in TPN daily 4) Will f/up ability to start full liquids - once tolerates full liquids, should be able to wean/d/c TPN  Christoper Fabian, PharmD, BCPS Clinical pharmacist, pager [phone] 08/18/2012 7:42 AM   Addendum (1045): Starting full liquid diet today, will wean TPN to 1/2 goal rate. If tolerated full liquids, hopefully can d/c TPN in a.m.

## 2012-08-18 NOTE — Progress Notes (Signed)
Discussed clarification with pharmacist regarding TPN and lipids  Lipids are to continue and TPN is also to be continued and then decreased at 1800 to 50cc/hr to wean the patient off TPN.

## 2012-08-18 NOTE — Progress Notes (Signed)
Pt asking about personal belongings.  Called down to 2600 asking for admitting nurse.  Secretary states she will inform the charge nurse and that the admitting nurse will be present this evening.  Will inform night nurse to follow up.

## 2012-08-18 NOTE — Progress Notes (Addendum)
Followed up on the patients lost/misplaced belongings. He said he is looking for his wallet, keys and clothes. The last time he remembered having it in his room downstairs. Called 2300, 2600 and security. Still waiting response. Will let charge nurse know. KYoung RN  Bag of belongings found in 2600 given to the patient. No other concerns at this time.

## 2012-08-19 LAB — CBC
HCT: 30.2 % — ABNORMAL LOW (ref 39.0–52.0)
Hemoglobin: 10.2 g/dL — ABNORMAL LOW (ref 13.0–17.0)
MCH: 29.1 pg (ref 26.0–34.0)
MCV: 86 fL (ref 78.0–100.0)
RBC: 3.51 MIL/uL — ABNORMAL LOW (ref 4.22–5.81)

## 2012-08-19 MED ORDER — FAMOTIDINE 20 MG PO TABS
20.0000 mg | ORAL_TABLET | Freq: Two times a day (BID) | ORAL | Status: DC
  Administered 2012-08-19 – 2012-08-20 (×3): 20 mg via ORAL
  Filled 2012-08-19 (×4): qty 1

## 2012-08-19 NOTE — Progress Notes (Signed)
9 Days Post-Op   Assessment: s/p Procedure(s): EXPLORATORY LAPAROTOMY Patient Active Problem List  Diagnosis  . Anemia due to GI blood loss  . Acute respiratory failure  . Hypertension  . Status post laparotomy with oversew of bleeding duodenal artery  . Duodenal ulcer  . Alcohol abuse  . Encephalopathy acute    Improving and close to being able to go home  Plan: Advance diet Plan for discharge tomorrow  Subjective: Feels better daily, good pain control, tolerating full liquids and eating all of it.  Objective: Vital signs in last 24 hours: Temp:  [98.4 F (36.9 C)-98.8 F (37.1 C)] 98.8 F (37.1 C) (01/19 0441) Pulse Rate:  [80-96] 80  (01/19 0441) Resp:  [18-19] 18  (01/19 0441) BP: (116-140)/(60-108) 140/108 mmHg (01/19 0441) SpO2:  [98 %-99 %] 98 % (01/19 0441)   Intake/Output from previous day: 01/18 0701 - 01/19 0700 In: 1841.7 [P.O.:240; I.V.:478.3; TPN:1123.3] Out: 2500 [Urine:2450; Drains:50] Intake/Output this shift: Total I/O In: -  Out: 230 [Urine:200; Drains:30]   General appearance: alert, cooperative and no distress Resp: clear to auscultation bilaterally GI: soft, non-tender; bowel sounds normal; no masses,  no organomegaly  Incision: healing well  Lab Results:   Basename 08/19/12 0620 08/18/12 0440  WBC 10.7* 11.0*  HGB 10.2* 10.7*  HCT 30.2* 31.4*  PLT 324 324   BMET No results found for this basename: NA:2,K:2,CL:2,CO2:2,GLUCOSE:2,BUN:2,CREATININE:2,CALCIUM:2 in the last 72 hours PT/INR No results found for this basename: LABPROT:2,INR:2 in the last 72 hours ABG No results found for this basename: PHART:2,PCO2:2,PO2:2,HCO3:2 in the last 72 hours  MEDS, Scheduled    . antiseptic oral rinse  15 mL Mouth Rinse q12n4p  . chlorhexidine  15 mL Mouth Rinse BID  . feeding supplement  1 Container Oral Q24H    Studies/Results: No results found.    LOS: 10 days     Currie Paris, MD, [facility],  [place] [phone]   08/19/2012 9:02 AM

## 2012-08-19 NOTE — Progress Notes (Signed)
PARENTERAL NUTRITION CONSULT NOTE - Follow up  Pharmacy Consult for TPN Indication: Bleeding duodenal ulcer  Noted TPN d/c 1/19 a.m. Will d/c TPN labs, orderset. Will also change pepcid back to po - please consider whether pt will need this at d/c.  Christoper Fabian, PharmD, BCPS Clinical pharmacist, pager [phone] 08/19/2012  9:27 AM

## 2012-08-20 ENCOUNTER — Telehealth (INDEPENDENT_AMBULATORY_CARE_PROVIDER_SITE_OTHER): Payer: Self-pay

## 2012-08-20 MED ORDER — FAMOTIDINE 20 MG PO TABS: 20.0000 mg | ORAL_TABLET | Freq: Two times a day (BID) | ORAL | Status: DC

## 2012-08-20 MED ORDER — OMEPRAZOLE 20 MG PO CPDR: 20.0000 mg | DELAYED_RELEASE_CAPSULE | Freq: Every day | ORAL | Status: DC

## 2012-08-20 MED ORDER — OXYCODONE-ACETAMINOPHEN 5-325 MG PO TABS: 1.0000 | ORAL_TABLET | Freq: Four times a day (QID) | ORAL | Status: DC | PRN

## 2012-08-20 NOTE — Anesthesia Postprocedure Evaluation (Signed)
  Anesthesia Post-op Note  Patient: [name]  Procedure(s) Performed: Procedure(s) (LRB) with comments: EXPLORATORY LAPAROTOMY (N/A)  Patient Location: PACU and ICU  Anesthesia Type:General  Level of Consciousness: Patient remains intubated per anesthesia plan  Airway and Oxygen Therapy: Patient remains intubated per anesthesia plan and Patient placed on Ventilator (see vital sign flow sheet for setting)  Post-op Pain: none  Post-op Assessment: Post-op Vital signs reviewed, Patient's Cardiovascular Status Stable, Respiratory Function Stable, Patent Airway, No signs of Nausea or vomiting and Pain level controlled  Post-op Vital Signs: stable  Complications: No apparent anesthesia complications

## 2012-08-20 NOTE — Progress Notes (Signed)
NUTRITION FOLLOW UP  Intervention:   1. Continue Resource Breeze po daily, each supplement provides 250 kcal and 9 grams of protein. 2. RD to continue to follow nutrition care plan.  Nutrition Dx:   Inadequate oral intake related to inability to eat as evidenced by NPO status. Resolved.   Goal:   Pt to meet >/= 90% of their estimated nutrition needs; met.  Monitor:  Weights, labs, I/O's  Assessment:   10 days Post-Op: EXPLORATORY LAPAROTOMY (N/A), duodenotomy with oversewing bleeding duodenal ulcer.  Pt transitioned off TPN.  Pt tolerating solid food diet and eating well. Pt also consuming Resource Breeze supplement.  Noted most recent magnesium and phosphorus are WNL.   Height: Ht Readings from Last 1 Encounters:  08/09/12 5\' 9"  (1.753 m)    Weight Status:   Wt Readings from Last 1 Encounters:  08/12/12 173 lb 4.5 oz (78.6 kg)  Admit wt was 160 lb. Wt now trending back to admit weight.  Re-estimated needs:  Kcal: 1800 - 2100 Protein: 100 - 120 grams Fluid: 1.8 - 2 liters daily  Skin: intact  Diet Order: Fat Restricted   Intake/Output Summary (Last 24 hours) at 08/20/12 0915 Last data filed at 08/20/12 0500  Gross per 24 hour  Intake    712 ml  Output   1650 ml  Net   -938 ml    Last BM: 1/18   Labs:   Lab 08/18/12 0440 08/17/12 0600 08/16/12 0515 08/14/12 0400  NA -- -- 139 139  K -- -- 3.9 3.4*  CL -- -- 101 101  CO2 -- -- 30 32  BUN -- -- 17 13  CREATININE -- -- 0.86 0.79  CALCIUM -- -- 8.8 8.5  MG 2.2 2.1 2.1 --  PHOS 4.1 4.6 4.8* --  GLUCOSE -- -- 114* 124*    CBG (last 3)  No results found for this basename: GLUCAP:3 in the last 72 hours  Scheduled Meds:    . famotidine  20 mg Oral BID  . feeding supplement  1 Container Oral Q24H    Continuous Infusions:    . sodium chloride 20 mL/hr at 08/18/12 1147    [name]  Dietetic Intern Pager: [phone]  I agree with the above information. Ryan Motto MS, RD,  LDN Pager: [phone] After-hours pager: [phone]

## 2012-08-20 NOTE — Anesthesia Preprocedure Evaluation (Signed)
Anesthesia Evaluation  Patient identified by MRN, date of birth, ID band Patient unresponsive    Reviewed: Allergy & Precautions, H&P , NPO status , Patient's Chart, lab work & pertinent test results  Airway       Dental   Pulmonary  Intubated in endo bbs= + rhonchi         Cardiovascular hypertension, Rhythm:Regular Rate:Tachycardia  hemodyn unstable with GI bleed from endo   Neuro/Psych    GI/Hepatic PUD, GERD-  ,  Endo/Other    Renal/GU      Musculoskeletal   Abdominal   Peds  Hematology   Anesthesia Other Findings intubated  Reproductive/Obstetrics                           Anesthesia Physical Anesthesia Plan  ASA: III and emergent  Anesthesia Plan: General   Post-op Pain Management:    Induction: Intravenous  Airway Management Planned: Oral ETT  Additional Equipment: Arterial line  Intra-op Plan:   Post-operative Plan: Post-operative intubation/ventilation  Informed Consent: I have reviewed the patients History and Physical, chart, labs and discussed the procedure including the risks, benefits and alternatives for the proposed anesthesia with the patient or authorized representative who has indicated his/her understanding and acceptance.     Plan Discussed with: CRNA and Surgeon  Anesthesia Plan Comments:         Anesthesia Quick Evaluation

## 2012-08-20 NOTE — Telephone Encounter (Signed)
Message copied by Brennan Bailey on Mon Aug 20, 2012  1:33 PM ------      Message from: Wilder Glade      Created: Mon Aug 20, 2012  9:09 AM                   ----- Message -----         From: Aris [place], PA-C         Sent: 08/20/2012   8:38 AM           To: [facility] Clinical Pool            Dr. Dixon Boos RN:            Will also need staples removed in 4-5 days.

## 2012-08-20 NOTE — Telephone Encounter (Signed)
LMOM to call back to make a nurse visit for this week to have staples removed.

## 2012-08-20 NOTE — Progress Notes (Signed)
Clinical Social Work Department BRIEF PSYCHOSOCIAL ASSESSMENT 08/20/2012  Patient:  [name], [name]     Account Number:  [id number]     Admit date:  08/09/2012  Clinical Social Worker:  Dennison Bulla  Date/Time:  08/20/2012 10:00 AM  Referred by:  Physician  Date Referred:  08/20/2012 Referred for  Transportation assistance   Other Referral:   Interview type:  Patient Other interview type:    PSYCHOSOCIAL DATA Living Status:  FAMILY Admitted from facility:   Level of care:   Primary support name:  Marcelino Duster Primary support relationship to patient:  NONE Degree of support available:   Adequate    CURRENT CONCERNS Current Concerns  Other - See comment   Other Concerns:   Transportation    SOCIAL WORK ASSESSMENT / PLAN CSW received referral to assist with transportation needs. CSW reviewed chart and met with patient at bedside. No visitors present.    CSW introduced myself and explained role. Patient reports when he dc from the hospital he does not have transportation. Patient states he usually uses the bus and is aware of routes. Patient requested bus pass. CSW provided patient with bus pass.    CSW informed RN that patient has bus pass and knows how to ride the bus. CSW is signing off but can be consulted if further needs arise.   Assessment/plan status:  No Further Intervention Required Other assessment/ plan:   Information/referral to community resources:   Bus pass    PATIENT'S/FAMILY'S RESPONSE TO PLAN OF CARE: Patient alert and oriented. Patient thanked CSW for visit and reports no further needs at this tims.

## 2012-08-20 NOTE — Progress Notes (Signed)
Pt discharged home discharge instructions explained pt verbalized understanding.

## 2012-08-20 NOTE — Discharge Summary (Signed)
Physician Discharge Summary  Patient ID: [name] MRN: [id number] DOB/AGE: 52/11/1960 52 y.o.  Admit date: 08/09/2012 Discharge date: 08/20/2012  Admitting Diagnosis: Acute blood loss anemia Upper abdominal pain Duodenal Ulcer Etoh Abuse Hypotension  Discharge Diagnosis Patient Active Problem List   Diagnosis Date Noted  . Anemia due to GI blood loss 08/10/2012  . Acute respiratory failure 08/10/2012  . Hypertension 08/10/2012  . Status post laparotomy with oversew of bleeding duodenal artery 08/10/2012  . Duodenal ulcer 08/10/2012  . Alcohol abuse 08/10/2012  . Encephalopathy acute 08/10/2012    Consultants Dr. Gerilyn Pilgrim (GI) Dr Sung Amabile (CCM) Dr. Marchelle Gearing (CCM) Dr. Mahala Menghini ([facility])   Imaging: No results found.  Procedures Dr Lindie Spruce 1/101/4-Exploratory laparotomy, longitudinal duodenotomy, duodenal oversew Dr. Gerilyn Pilgrim 08/10/12-EGD w/ control of bleeding  Hospital Course:  [name] is a 52 y.o. male is an AAM who started having GI upset since 07/31/12-he had an episode of emesis on that day and then on new years day as well. This is the first time this has happened.  In 03/2012 had upper abdominal pain and CT showed "profound asymmetric thickening of the distal esophagus". He took rx'd PPI, Pantoprazole 20 mg q day until it ran out ~ 05/2012. He never followed up with a GI MD as recommended by ED staff. Hgb then was 17.1.  Describes gastric distress with n/v if he eats fatty or spicy foods. Started vomiting Tuesday through today. Yesterday vomit turned maroon, stool Formed and darker but not bloody. This AM with hematemesis persisting, he developed syncope. EMS brought him to ED. Hgb is 5.9. BUN is 31. Maroon, bloody material coming out an NGT. 3 units blood ordered for Hgb of 5.9 and FFP also ordered. However no PPI resulted at present.  He reports he drinks 1-2 beers daily-he quantifies this as drinking about 40 ounces daily but his last drink was 12/31.  He [facility]  [facility]  Dr. Gerilyn Pilgrim was consulted.  He underwent a EGD w/ control of bleeding.  Active bleeding during the procedure led to general surgery being consulted for an emergent laparotomy.  The patient underwent the procedure above.  He was transferred to the ICU due to decompensation requiring intubation.  CCM was conulted and followed the patient for respiratory failure.  On 08/11/12 he was extubated and on 08/12/12 he was transferred from the unit to the surgery floor.  PT/OT were consulted and recommended no further follow up needed.  Over the next few days he experienced significant pain and nausea.  He was NPO with NG tube in place.  On 08/14/12 he appeared to be showing some signs of alcohol withdrawal including confusion, and the nurses utilized CIWA protocol for management.  On 08/15/12 the patient was less confused, pain well controlled and bowel function started to return.  His diet was advanced as tolerated.  On POD #11, the patient was voiding well, tolerating diet, ambulating well, pain well controlled, vital signs stable, incisions c/d/i and felt stable for discharge home.  Patient will follow up in our office in 2 weeks and knows to call with questions or concerns.  Physical Exam: General:  Alert, NAD, pleasant, comfortable Abd:  Soft, ND, mild tenderness, incisions C/D/I, +BS, drain with minimal sanguinous drainage  (to be discontinued prior to discharge)    Medication List     As of 08/20/2012 11:00 AM    TAKE these medications         famotidine 20 MG tablet  Commonly known as: PEPCID   Take 1 tablet (20 mg total) by mouth 2 (two) times daily.      omeprazole 20 MG capsule   Commonly known as: PRILOSEC   Take 1 capsule (20 mg total) by mouth daily.      oxyCODONE-acetaminophen 5-325 MG per tablet   Commonly known as: PERCOCET/ROXICET   Take 1-2 tablets by mouth every 6 (six) hours as needed.           Follow-up Information     Follow up with WYATT, Marta Lamas, MD. Schedule an appointment as soon as possible for a visit in 2 weeks.   Contact information:   [address] [place] [address] [phone]       Follow up with Default, Provider, MD. (make an appt with a Primary Care provider)    Contact information:   [address] [place] [address] [phone]       Follow up with Rob Bunting, MD. Schedule an appointment as soon as possible for a visit in 2 weeks.   Contact information:   [address] [place] [address] [phone]          Signed: Aris [place] [facility] [phone]  08/20/2012, 7:51 AM  /

## 2012-08-20 NOTE — Discharge Summary (Signed)
Agree 

## 2012-08-21 ENCOUNTER — Telehealth (INDEPENDENT_AMBULATORY_CARE_PROVIDER_SITE_OTHER): Payer: Self-pay

## 2012-08-21 NOTE — Telephone Encounter (Signed)
Called pt and made nurse visit tomorrow for staple removal

## 2012-08-22 ENCOUNTER — Encounter (INDEPENDENT_AMBULATORY_CARE_PROVIDER_SITE_OTHER): Payer: Self-pay | Admitting: General Surgery

## 2012-08-22 ENCOUNTER — Encounter (HOSPITAL_COMMUNITY): Payer: Self-pay

## 2012-08-22 ENCOUNTER — Encounter (INDEPENDENT_AMBULATORY_CARE_PROVIDER_SITE_OTHER): Payer: Self-pay

## 2012-08-22 ENCOUNTER — Ambulatory Visit (INDEPENDENT_AMBULATORY_CARE_PROVIDER_SITE_OTHER): Payer: Medicaid Other

## 2012-08-22 ENCOUNTER — Emergency Department (HOSPITAL_COMMUNITY)
Admission: RE | Admit: 2012-08-22 | Discharge: 2012-08-22 | Disposition: A | Discharge status: Home / Self Care | Payer: Medicaid Other | Source: Ambulatory Visit | Attending: General Surgery | Admitting: General Surgery

## 2012-08-22 VITALS — BP 116/64 | HR 76 | Temp 98.4°F | Resp 16 | Ht 69.0 in | Wt 148.6 lb

## 2012-08-22 DIAGNOSIS — R110 Nausea: Secondary | ICD-10-CM | POA: Insufficient documentation

## 2012-08-22 DIAGNOSIS — R109 Unspecified abdominal pain: Secondary | ICD-10-CM

## 2012-08-22 DIAGNOSIS — Z4802 Encounter for removal of sutures: Secondary | ICD-10-CM

## 2012-08-22 MED ORDER — IOHEXOL 300 MG/ML  SOLN
80.0000 mL | Freq: Once | INTRAMUSCULAR | Status: AC | PRN
  Administered 2012-08-22: 80 mL via INTRAVENOUS

## 2012-08-22 NOTE — Progress Notes (Signed)
Patient ID: [name], male   DOB: [birth year], 52 y.o.   MRN: [id number] He is postop day #12 from an emergency exploratory laparotomy and oversewing of bleeding posterior duodenal ulcer. He was sent home 2 days ago. He was doing well until this morning when he ate some ice cream and had nausea and vomiting. He developed pain in his incision following this. He went to the office to have his staples removed and was referred to radiology for CT of the abdomen and pelvis. This demonstrated some edema at the operative site (duodenum) as well as a very small amount of free fluid but no evidence of infection. He's had no fevers or chills. He has been passing gas. On examination, his abdomen is soft with no significant tenderness. I removed the midline staples and applied benzoin Steri-Strips. I removed a drain site suture in the right lower quadrant.   I discussed with him pre-admission to the hospital if he cannot keep anything down but he refused. Of note is that he has not filled his prescription for his Prilosec and I told him this was extremely important. He wants to go home and assures me that he will start his Prilosec today. I told him if he has any further problems he should let us know and we may have to admit him back to the hospital.

## 2012-08-23 MED FILL — Medication: Qty: 1 | Status: AC

## 2012-09-04 ENCOUNTER — Encounter (INDEPENDENT_AMBULATORY_CARE_PROVIDER_SITE_OTHER): Payer: Self-pay | Admitting: General Surgery

## 2012-10-16 ENCOUNTER — Encounter (INDEPENDENT_AMBULATORY_CARE_PROVIDER_SITE_OTHER): Payer: Self-pay | Admitting: General Surgery

## 2012-10-29 ENCOUNTER — Encounter (INDEPENDENT_AMBULATORY_CARE_PROVIDER_SITE_OTHER): Payer: Self-pay | Admitting: General Surgery

## 2012-12-23 ENCOUNTER — Emergency Department (HOSPITAL_COMMUNITY)
Admission: EM | Admit: 2012-12-23 | Discharge: 2012-12-24 | Disposition: A | Discharge status: Home / Self Care | Payer: Medicaid Other | Attending: Emergency Medicine | Admitting: Emergency Medicine

## 2012-12-23 ENCOUNTER — Encounter (HOSPITAL_COMMUNITY): Payer: Self-pay | Admitting: Emergency Medicine

## 2012-12-23 DIAGNOSIS — Z8719 Personal history of other diseases of the digestive system: Secondary | ICD-10-CM | POA: Insufficient documentation

## 2012-12-23 DIAGNOSIS — F17200 Nicotine dependence, unspecified, uncomplicated: Secondary | ICD-10-CM | POA: Insufficient documentation

## 2012-12-23 DIAGNOSIS — Z79899 Other long term (current) drug therapy: Secondary | ICD-10-CM | POA: Insufficient documentation

## 2012-12-23 DIAGNOSIS — Z9889 Other specified postprocedural states: Secondary | ICD-10-CM | POA: Insufficient documentation

## 2012-12-23 DIAGNOSIS — R109 Unspecified abdominal pain: Secondary | ICD-10-CM | POA: Insufficient documentation

## 2012-12-23 DIAGNOSIS — K219 Gastro-esophageal reflux disease without esophagitis: Secondary | ICD-10-CM | POA: Insufficient documentation

## 2012-12-23 NOTE — ED Notes (Signed)
Pt arrived via EMS with a complaint of abdominal pain. Pt states it began today after he drank a hot beer.  Pt is not a good historian.  Pt takes a medication but is unaware of what it is or what it is for.

## 2012-12-24 LAB — COMPREHENSIVE METABOLIC PANEL
AST: 36 U/L (ref 0–37)
CO2: 27 mEq/L (ref 19–32)
Calcium: 9.6 mg/dL (ref 8.4–10.5)
Creatinine, Ser: 0.97 mg/dL (ref 0.50–1.35)
GFR calc Af Amer: >90 mL/min (ref >90)
GFR calc non Af Amer: >90 mL/min (ref >90)

## 2012-12-24 LAB — PROTIME-INR
INR: 1 (ref 0.00–1.49)
Prothrombin Time: 13.1 seconds (ref 11.6–15.2)

## 2012-12-24 LAB — CBC WITH DIFFERENTIAL/PLATELET
Basophils Absolute: 0 10*3/uL (ref 0.0–0.1)
Eosinophils Absolute: 0 10*3/uL (ref 0.0–0.7)
Eosinophils Relative: 0 % (ref 0–5)
HCT: 41.2 % (ref 39.0–52.0)
Lymphocytes Relative: 7 % — ABNORMAL LOW (ref 12–46)
MCH: 25 pg — ABNORMAL LOW (ref 26.0–34.0)
MCHC: 32.8 g/dL (ref 30.0–36.0)
MCV: 76.3 fL — ABNORMAL LOW (ref 78.0–100.0)
Monocytes Absolute: 0.8 10*3/uL (ref 0.1–1.0)
RDW: 16.3 % — ABNORMAL HIGH (ref 11.5–15.5)
WBC: 11.8 10*3/uL — ABNORMAL HIGH (ref 4.0–10.5)

## 2012-12-24 NOTE — ED Provider Notes (Signed)
History     CSN: [id number]  Arrival date & time 12/23/12  2323   First MD Initiated Contact with Patient 12/23/12 2352      Chief Complaint  Patient presents with  . Abdominal Pain    (Consider location/radiation/quality/duration/timing/severity/associated sxs/prior treatment) HPI Comments: Patient has Hx ETOH and cocaine abuse has intestinal surgery for bleeding ulcers in Janurary of this year now drinks less frequently but at least every other day.  Today drank a hot beer and developed pain that moves around his abdomen.  Denies vomiting, diarrhea, trauma, change in color of stools   Patient is a 52 y.o. male presenting with abdominal pain. The history is provided by the patient.  Abdominal Pain This is a recurrent problem. The current episode started today. The problem occurs intermittently. The problem has been rapidly improving. Associated symptoms include abdominal pain. Pertinent negatives include no chills, coughing, headaches, myalgias, nausea, rash or vomiting. Exacerbated by: beer. He has tried nothing for the symptoms.    Past Medical History  Diagnosis Date  . Abdominal  pain, other specified site   . GERD (gastroesophageal reflux disease)   . Abnormal CT scan, esophagus 03/2012  . Substance abuse 03/2012    tox screen positive for cocaine.     Past Surgical History  Procedure Laterality Date  . Undescended testicle      on right. noted on 03/2012 CT scan.  no surgery referral.   . Esophagogastroduodenoscopy  08/10/2012    Procedure: ESOPHAGOGASTRODUODENOSCOPY (EGD);  Surgeon: [name], MD;  Location: [facility] ENDOSCOPY;  Service: Endoscopy;  Laterality: N/A;  will be done in room 1   . Laparotomy  08/10/2012    Procedure: EXPLORATORY LAPAROTOMY;  Surgeon: [name], MD;  [facility];  Service: General;  Laterality: N/A;    History reviewed. No pertinent family history.  History  Substance Use Topics  . Smoking status: Current Every Day Smoker -- 1.00  packs/day for 34 years  . Smokeless tobacco: Not on file  . Alcohol Use: Yes     Comment: 40oz beer/day      Review of Systems  Constitutional: Negative for chills.  Respiratory: Negative for cough.   Gastrointestinal: Positive for abdominal pain. Negative for nausea, vomiting, diarrhea, constipation and blood in stool.  Genitourinary: Negative for decreased urine volume.  Musculoskeletal: Negative for myalgias.  Skin: Negative for rash.  Neurological: Negative for dizziness and headaches.  All other systems reviewed and are negative.    Allergies  Review of patient's allergies indicates no known allergies.  Home Medications   Current Outpatient Rx  Name  Route  Sig  Dispense  Refill  . famotidine (PEPCID) 20 MG tablet   Oral   Take 1 tablet (20 mg total) by mouth 2 (two) times daily.   60 tablet   2   . omeprazole (PRILOSEC) 20 MG capsule   Oral   Take 1 capsule (20 mg total) by mouth daily.   60 capsule   2   . oxyCODONE-acetaminophen (PERCOCET/ROXICET) 5-325 MG per tablet   Oral   Take 1-2 tablets by mouth every 6 (six) hours as needed.   40 tablet   0     BP 153/103  Pulse 64  Temp(Src) 98.9 F (37.2 C) (Oral)  Resp 18  Wt 163 lb (73.936 kg)  BMI 24.06 kg/m2  SpO2 97%  Physical Exam  Nursing note and vitals reviewed. Constitutional: He appears well-developed and well-nourished.  HENT:  Head: Normocephalic  and atraumatic.  Eyes: Pupils are equal, round, and reactive to light.  Neck: Normal range of motion.  Cardiovascular: Normal rate and regular rhythm.   Pulmonary/Chest: Effort normal and breath sounds normal.  Abdominal: Soft. Bowel sounds are normal. He exhibits no distension. There is no tenderness.  midline surgical scar well healed     ED Course  Procedures (including critical care time)  Labs Reviewed  CBC WITH DIFFERENTIAL - Abnormal; Notable for the following:    WBC 11.8 (*)    MCV 76.3 (*)    MCH 25.0 (*)    RDW 16.3 (*)     Neutrophils Relative % 87 (*)    Neutro Abs 10.2 (*)    Lymphocytes Relative 7 (*)    All other components within normal limits  COMPREHENSIVE METABOLIC PANEL  ETHANOL  PROTIME-INR  LIPASE, BLOOD   No results found.   1. Abdominal pain       MDM   Patient states no pain, nausea after sleeping for a short period of time         Arman Filter, NP 12/24/12 [phone]

## 2012-12-24 NOTE — ED Provider Notes (Signed)
Medical screening examination/treatment/procedure(s) were performed by non-physician practitioner and as supervising physician I was immediately available for consultation/collaboration.  Lyanne Co, MD 12/24/12 [phone]

## 2013-01-26 ENCOUNTER — Emergency Department (HOSPITAL_COMMUNITY)
Admission: EM | Admit: 2013-01-26 | Discharge: 2013-01-27 | Disposition: A | Discharge status: Home / Self Care | Payer: Medicaid Other | Attending: Emergency Medicine | Admitting: Emergency Medicine

## 2013-01-26 ENCOUNTER — Encounter (HOSPITAL_COMMUNITY): Payer: Self-pay | Admitting: Emergency Medicine

## 2013-01-26 ENCOUNTER — Emergency Department (HOSPITAL_COMMUNITY): Payer: Medicaid Other

## 2013-01-26 DIAGNOSIS — F17200 Nicotine dependence, unspecified, uncomplicated: Secondary | ICD-10-CM | POA: Insufficient documentation

## 2013-01-26 DIAGNOSIS — K2970 Gastritis, unspecified, without bleeding: Secondary | ICD-10-CM

## 2013-01-26 DIAGNOSIS — K219 Gastro-esophageal reflux disease without esophagitis: Secondary | ICD-10-CM | POA: Insufficient documentation

## 2013-01-26 DIAGNOSIS — Z79899 Other long term (current) drug therapy: Secondary | ICD-10-CM | POA: Insufficient documentation

## 2013-01-26 DIAGNOSIS — R109 Unspecified abdominal pain: Secondary | ICD-10-CM

## 2013-01-26 DIAGNOSIS — F1210 Cannabis abuse, uncomplicated: Secondary | ICD-10-CM | POA: Insufficient documentation

## 2013-01-26 DIAGNOSIS — K5289 Other specified noninfective gastroenteritis and colitis: Secondary | ICD-10-CM | POA: Insufficient documentation

## 2013-01-26 DIAGNOSIS — R112 Nausea with vomiting, unspecified: Secondary | ICD-10-CM | POA: Insufficient documentation

## 2013-01-26 LAB — CBC WITH DIFFERENTIAL/PLATELET
Basophils Absolute: 0 10*3/uL (ref 0.0–0.1)
Basophils Relative: 0 % (ref 0–1)
Eosinophils Absolute: 0 10*3/uL (ref 0.0–0.7)
Eosinophils Relative: 0 % (ref 0–5)
HCT: 34.8 % — ABNORMAL LOW (ref 39.0–52.0)
Hemoglobin: 11.1 g/dL — ABNORMAL LOW (ref 13.0–17.0)
MCH: 24.1 pg — ABNORMAL LOW (ref 26.0–34.0)
MCHC: 31.9 g/dL (ref 30.0–36.0)
MCV: 75.5 fL — ABNORMAL LOW (ref 78.0–100.0)
Monocytes Absolute: 1.2 10*3/uL — ABNORMAL HIGH (ref 0.1–1.0)
Monocytes Relative: 11 % (ref 3–12)
Neutro Abs: 8.8 10*3/uL — ABNORMAL HIGH (ref 1.7–7.7)
RDW: 15.3 % (ref 11.5–15.5)

## 2013-01-26 LAB — COMPREHENSIVE METABOLIC PANEL
AST: 22 U/L (ref 0–37)
Albumin: 3.6 g/dL (ref 3.5–5.2)
BUN: 7 mg/dL (ref 6–23)
Calcium: 9.6 mg/dL (ref 8.4–10.5)
Chloride: 100 mEq/L (ref 96–112)
Creatinine, Ser: 0.96 mg/dL (ref 0.50–1.35)
Total Bilirubin: 0.3 mg/dL (ref 0.3–1.2)

## 2013-01-26 LAB — LIPASE, BLOOD: Lipase: 18 U/L (ref 11–59)

## 2013-01-26 MED ORDER — OXYCODONE-ACETAMINOPHEN 5-325 MG PO TABS: 1.0000 | ORAL_TABLET | ORAL | Status: DC | PRN

## 2013-01-26 MED ORDER — OMEPRAZOLE 20 MG PO CPDR: 20.0000 mg | DELAYED_RELEASE_CAPSULE | Freq: Every day | ORAL | Status: DC

## 2013-01-26 MED ORDER — HYDROMORPHONE HCL PF 1 MG/ML IJ SOLN
1.0000 mg | Freq: Once | INTRAMUSCULAR | Status: AC
  Administered 2013-01-26: 1 mg via INTRAVENOUS
  Filled 2013-01-26: qty 1

## 2013-01-26 MED ORDER — SODIUM CHLORIDE 0.9 % IV BOLUS (SEPSIS)
1000.0000 mL | Freq: Once | INTRAVENOUS | Status: AC
  Administered 2013-01-26: 1000 mL via INTRAVENOUS

## 2013-01-26 MED ORDER — ONDANSETRON HCL 4 MG/2ML IJ SOLN
4.0000 mg | Freq: Once | INTRAMUSCULAR | Status: AC
  Administered 2013-01-26: 4 mg via INTRAVENOUS
  Filled 2013-01-26: qty 2

## 2013-01-26 NOTE — ED Provider Notes (Signed)
11:56 PM Pt feels much better at this time. Keeping oral fluids down.  This may represent gastritis.  Home on Prilosec and home with pain medications.  I recommended that he minimize his alcohol intake.  Outpatient GI followup.  Understands to return to the ER for new or worsening symptoms  The primary encounter diagnosis was Abdominal pain. A diagnosis of Gastritis was also pertinent to this visit.  Dg Abd Acute W/chest  01/26/2013   *RADIOLOGY REPORT*  Clinical Data: Weekly, left larger at, altered level of consciousness, left-sided abdominal pain, evaluate for free air  ACUTE ABDOMEN SERIES (ABDOMEN 2 VIEW & CHEST 1 VIEW)  Comparison: CT abdomen pelvis - 08/22/2012; chest radiograph - 08/13/2012; 03/02/2012  Findings:  Grossly unchanged enlarged cardiac silhouette.  Normal mediastinal contours.  Improved aeration of the bilateral lungs. There are persistent heterogeneous opacities within the left lung apex with associated architectural distortion to adjacent pleural parenchymal thickening.  No new focal airspace opacities.  No pleural effusion or pneumothorax.  No evidence of edema.  Nonobstructive bowel gas pattern.  No pneumoperitoneum, pneumatosis or portal venous gas.  No definite abnormal intra-abdominal calcifications.  No acute osseous abnormalities.  IMPRESSION:  1.  No acute cardiopulmonary disease.   Grossly unchanged heterogeneous opacities and architectural distortion within the left upper lung, similar to the 03/2012 examination and favored to represent atelectasis or scar.  2.  Nonobstructive bowel gas pattern.  No evidence of pneumoperitoneum.   Original Report Authenticated By: Tacey Ruiz, MD   I personally reviewed the imaging tests through PACS system I reviewed available ER/hospitalization records through the EMR   Lyanne Co, MD 01/26/13 2358

## 2013-01-26 NOTE — ED Provider Notes (Signed)
History    CSN: [id number] Arrival date & time 01/26/13  2210  First MD Initiated Contact with Patient 01/26/13 2215     Chief Complaint  Patient presents with  . Abdominal Pain  . Nausea   (Consider location/radiation/quality/duration/timing/severity/associated sxs/prior Treatment) Patient is a 52 y.o. male presenting with abdominal pain. The history is provided by the patient.  Abdominal Pain This is a new (pain is 10/10) problem. The current episode started 6 to 12 hours ago. The problem occurs constantly. The problem has been gradually worsening. Associated symptoms include abdominal pain. Pertinent negatives include no shortness of breath. Associated symptoms comments: States started after drinking a beer this am.  Has been vomiting since this morning but denies hematemesis.  . The symptoms are aggravated by walking, bending and eating. Nothing relieves the symptoms. He has tried nothing for the symptoms. The treatment provided no relief.   Past Medical History  Diagnosis Date  . Abdominal  pain, other specified site   . GERD (gastroesophageal reflux disease)   . Abnormal CT scan, esophagus 03/2012  . Substance abuse 03/2012    tox screen positive for cocaine.    Past Surgical History  Procedure Laterality Date  . Undescended testicle      on right. noted on 03/2012 CT scan.  no surgery referral.   . Esophagogastroduodenoscopy  08/10/2012    Procedure: ESOPHAGOGASTRODUODENOSCOPY (EGD);  Surgeon: [name], MD;  Location: [facility] ENDOSCOPY;  Service: Endoscopy;  Laterality: N/A;  will be done in room 1   . Laparotomy  08/10/2012    Procedure: EXPLORATORY LAPAROTOMY;  Surgeon: [name], MD;  Location: [facility] OR;  Service: General;  Laterality: N/A;   No family history on file. History  Substance Use Topics  . Smoking status: Current Every Day Smoker -- 1.00 packs/day for 34 years  . Smokeless tobacco: Not on file  . Alcohol Use: Yes     Comment: 40oz beer/day    Review of  Systems  Constitutional: Negative for fever.  Respiratory: Negative for cough and shortness of breath.   Gastrointestinal: Positive for nausea, vomiting and abdominal pain. Negative for diarrhea and blood in stool.  All other systems reviewed and are negative.    Allergies  Review of patient's allergies indicates no known allergies.  Home Medications   Current Outpatient Rx  Name  Route  Sig  Dispense  Refill  . famotidine (PEPCID) 20 MG tablet   Oral   Take 1 tablet (20 mg total) by mouth 2 (two) times daily.   60 tablet   2   . omeprazole (PRILOSEC) 20 MG capsule   Oral   Take 1 capsule (20 mg total) by mouth daily.   60 capsule   2   . oxyCODONE-acetaminophen (PERCOCET/ROXICET) 5-325 MG per tablet   Oral   Take 1-2 tablets by mouth every 6 (six) hours as needed.   40 tablet   0    BP 149/84  Pulse 77  Temp(Src) 98.1 F (36.7 C) (Oral)  Resp 18  SpO2 98% Physical Exam  Nursing note and vitals reviewed. Constitutional: He is oriented to person, place, and time. He appears well-developed and well-nourished. He appears distressed.  Laying face down on the bed moaning  HENT:  Head: Normocephalic and atraumatic.  Mouth/Throat: Oropharynx is clear and moist.  Eyes: Conjunctivae and EOM are normal. Pupils are equal, round, and reactive to light.  Neck: Normal range of motion. Neck supple.  Cardiovascular: Normal rate, regular  rhythm and intact distal pulses.   No murmur heard. Pulmonary/Chest: Effort normal and breath sounds normal. No respiratory distress. He has no wheezes. He has no rales.  Abdominal: Soft. He exhibits no distension. There is tenderness in the epigastric area and left upper quadrant. There is guarding. There is no rebound and no CVA tenderness.    Well healed surgical scar in the center of the abdomen  Musculoskeletal: Normal range of motion. He exhibits no edema and no tenderness.  Neurological: He is alert and oriented to person, place, and  time.  Skin: Skin is warm and dry. No rash noted. No erythema.  Psychiatric: He has a normal mood and affect. His behavior is normal.    ED Course  Procedures (including critical care time) Labs Reviewed  CBC WITH DIFFERENTIAL - Abnormal; Notable for the following:    WBC 10.9 (*)    Hemoglobin 11.1 (*)    HCT 34.8 (*)    MCV 75.5 (*)    MCH 24.1 (*)    Neutrophils Relative % 80 (*)    Neutro Abs 8.8 (*)    Lymphocytes Relative 8 (*)    Monocytes Absolute 1.2 (*)    All other components within normal limits  COMPREHENSIVE METABOLIC PANEL  LIPASE, BLOOD   Dg Abd Acute W/chest  01/26/2013   *RADIOLOGY REPORT*  Clinical Data: Weekly, left larger at, altered level of consciousness, left-sided abdominal pain, evaluate for free air  ACUTE ABDOMEN SERIES (ABDOMEN 2 VIEW & CHEST 1 VIEW)  Comparison: CT abdomen pelvis - 08/22/2012; chest radiograph - 08/13/2012; 03/02/2012  Findings:  Grossly unchanged enlarged cardiac silhouette.  Normal mediastinal contours.  Improved aeration of the bilateral lungs. There are persistent heterogeneous opacities within the left lung apex with associated architectural distortion to adjacent pleural parenchymal thickening.  No new focal airspace opacities.  No pleural effusion or pneumothorax.  No evidence of edema.  Nonobstructive bowel gas pattern.  No pneumoperitoneum, pneumatosis or portal venous gas.  No definite abnormal intra-abdominal calcifications.  No acute osseous abnormalities.  IMPRESSION:  1.  No acute cardiopulmonary disease.   Grossly unchanged heterogeneous opacities and architectural distortion within the left upper lung, similar to the 03/2012 examination and favored to represent atelectasis or scar.  2.  Nonobstructive bowel gas pattern.  No evidence of pneumoperitoneum.   Original Report Authenticated By: Tacey Ruiz, MD   No diagnosis found.  MDM   Patient with a history of abdominal pain that started today after drinking alcohol. He states  he's had pancreatitis in the past and this feels similar. However patient also has a significant history of bleeding duodenal ulcer requiring surgery in January of this year due to profound anemia.  Pt having upper abd pain here and very uncomfortable.  Denies hematemesis today.  No diarrhea or fever.  Bowel sounds present and pt admits to still drinking but denies NSAID use. Patient given pain control and nausea control. CBC, CMP, lipase pending. Acute abdominal series will be obtained to rule out free air.  11:29 PM Plain film without free air.  CBC with stable hb and WBC wnl.  Gwyneth Sprout, MD 01/26/13 2329

## 2013-01-26 NOTE — ED Notes (Signed)
Per EMS: Pt reports mid abdominal pain at the umbilicus. Per EMS: pt abdomen is soft, non-distended, and non-tender. Pt reports nausea and vomiting earlier today. Pt denies constipation or diarrhea.

## 2013-01-26 NOTE — ED Notes (Signed)
ZOX:[id number]<AV> Expected date:01/26/13<BR> Expected time:10:19 PM<BR> Means of arrival:Ambulance<BR> Comments:<BR> EMS

## 2013-03-16 ENCOUNTER — Emergency Department (HOSPITAL_COMMUNITY)
Admission: EM | Admit: 2013-03-16 | Discharge: 2013-03-16 | Disposition: A | Discharge status: Home / Self Care | Payer: Medicaid Other | Attending: Emergency Medicine | Admitting: Emergency Medicine

## 2013-03-16 ENCOUNTER — Encounter (HOSPITAL_COMMUNITY): Payer: Self-pay | Admitting: Emergency Medicine

## 2013-03-16 DIAGNOSIS — R109 Unspecified abdominal pain: Secondary | ICD-10-CM

## 2013-03-16 DIAGNOSIS — K219 Gastro-esophageal reflux disease without esophagitis: Secondary | ICD-10-CM | POA: Insufficient documentation

## 2013-03-16 DIAGNOSIS — R1013 Epigastric pain: Secondary | ICD-10-CM | POA: Insufficient documentation

## 2013-03-16 DIAGNOSIS — F17200 Nicotine dependence, unspecified, uncomplicated: Secondary | ICD-10-CM | POA: Insufficient documentation

## 2013-03-16 DIAGNOSIS — Z79899 Other long term (current) drug therapy: Secondary | ICD-10-CM | POA: Insufficient documentation

## 2013-03-16 LAB — CBC WITH DIFFERENTIAL/PLATELET
Basophils Absolute: 0 10*3/uL (ref 0.0–0.1)
HCT: 36.9 % — ABNORMAL LOW (ref 39.0–52.0)
Lymphocytes Relative: 22 % (ref 12–46)
Lymphs Abs: 1.8 10*3/uL (ref 0.7–4.0)
Monocytes Absolute: 0.9 10*3/uL (ref 0.1–1.0)
Neutro Abs: 5.4 10*3/uL (ref 1.7–7.7)
Platelets: 263 10*3/uL (ref 150–400)
RBC: 4.96 MIL/uL (ref 4.22–5.81)
RDW: 15.5 % (ref 11.5–15.5)
WBC: 8.2 10*3/uL (ref 4.0–10.5)

## 2013-03-16 LAB — COMPREHENSIVE METABOLIC PANEL
ALT: 11 U/L (ref 0–53)
AST: 25 U/L (ref 0–37)
Alkaline Phosphatase: 73 U/L (ref 39–117)
CO2: 29 mEq/L (ref 19–32)
Chloride: 101 mEq/L (ref 96–112)
GFR calc non Af Amer: 66 mL/min — ABNORMAL LOW (ref >90)
Glucose, Bld: 104 mg/dL — ABNORMAL HIGH (ref 70–99)
Sodium: 138 mEq/L (ref 135–145)
Total Bilirubin: 0.2 mg/dL — ABNORMAL LOW (ref 0.3–1.2)

## 2013-03-16 MED ORDER — ONDANSETRON HCL 4 MG/2ML IJ SOLN
4.0000 mg | Freq: Once | INTRAMUSCULAR | Status: AC
  Administered 2013-03-16: 4 mg via INTRAVENOUS
  Filled 2013-03-16: qty 2

## 2013-03-16 MED ORDER — SODIUM CHLORIDE 0.9 % IV BOLUS (SEPSIS)
1000.0000 mL | Freq: Once | INTRAVENOUS | Status: AC
  Administered 2013-03-16: 1000 mL via INTRAVENOUS

## 2013-03-16 NOTE — ED Provider Notes (Signed)
CSN: [id number]     Arrival date & time 03/16/13  1616 History     First MD Initiated Contact with Patient 03/16/13 1651     Chief Complaint  Patient presents with  . Abdominal Pain   (Consider location/radiation/quality/duration/timing/severity/associated sxs/prior Treatment) HPI Comments: Patient is a 52 year old male with a past medical history of pancreatitis and alcohol abuse who presents with abdominal pain since this morning. The pain is located in his epigastrium and does not radiate. The pain is described as aching and severe. The pain started gradually and progressively worsened since the onset. No alleviating/aggravating factors. The patient has tried nothing for symptoms without relief. Patient reports drinking ETOH yesterday. Associated symptoms include nothing. Patient denies fever, headache, NVD, chest pain, SOB, dysuria, constipation.   Patient is a 52 y.o. male presenting with abdominal pain.  Abdominal Pain   Past Medical History  Diagnosis Date  . Abdominal  pain, other specified site   . GERD (gastroesophageal reflux disease)   . Abnormal CT scan, esophagus 03/2012  . Substance abuse 03/2012    tox screen positive for cocaine.    Past Surgical History  Procedure Laterality Date  . Undescended testicle      on right. noted on 03/2012 CT scan.  no surgery referral.   . Esophagogastroduodenoscopy  08/10/2012    Procedure: ESOPHAGOGASTRODUODENOSCOPY (EGD);  Surgeon: [name], MD;  Location: [facility] ENDOSCOPY;  Service: Endoscopy;  Laterality: N/A;  will be done in room 1   . Laparotomy  08/10/2012    Procedure: EXPLORATORY LAPAROTOMY;  Surgeon: [name], MD;  Location: [facility] OR;  Service: General;  Laterality: N/A;   History reviewed. No pertinent family history. History  Substance Use Topics  . Smoking status: Current Every Day Smoker -- 1.00 packs/day for 34 years  . Smokeless tobacco: Not on file  . Alcohol Use: Yes     Comment: 40oz beer/day    Review of  Systems  Gastrointestinal: Positive for abdominal pain.  All other systems reviewed and are negative.    Allergies  Peanut-containing drug products and Spinach  Home Medications   Current Outpatient Rx  Name  Route  Sig  Dispense  Refill  . ibuprofen (ADVIL,MOTRIN) 200 MG tablet   Oral   Take 400 mg by mouth every 6 (six) hours as needed for pain.         . naproxen sodium (ANAPROX) 220 MG tablet   Oral   Take 440 mg by mouth 2 (two) times daily with a meal.         . omeprazole (PRILOSEC) 20 MG capsule   Oral   Take 1 capsule (20 mg total) by mouth daily.   30 capsule   0    BP 152/95  Pulse 61  Temp(Src) 98.4 F (36.9 C) (Oral)  Resp 25  SpO2 96% Physical Exam  Nursing note and vitals reviewed. Constitutional: He is oriented to person, place, and time. He appears well-developed and well-nourished. No distress.  HENT:  Head: Normocephalic and atraumatic.  Eyes: Conjunctivae are normal. No scleral icterus.  Neck: Normal range of motion.  Cardiovascular: Normal rate and regular rhythm.  Exam reveals no gallop and no friction rub.   No murmur heard. Pulmonary/Chest: Effort normal and breath sounds normal. He has no wheezes. He has no rales. He exhibits no tenderness.  Abdominal: Soft. He exhibits no distension. There is tenderness. There is no rebound and no guarding.  Epigastric tenderness to palpation.  No other focal tenderness or peritoneal signs.   Musculoskeletal: Normal range of motion.  Neurological: He is alert and oriented to person, place, and time. Coordination normal.  Speech is goal-oriented. Moves limbs without ataxia.   Skin: Skin is warm and dry.  Psychiatric: He has a normal mood and affect. His behavior is normal.    ED Course   Procedures (including critical care time)  Labs Reviewed  CBC WITH DIFFERENTIAL - Abnormal; Notable for the following:    Hemoglobin 11.7 (*)    HCT 36.9 (*)    MCV 74.4 (*)    MCH 23.6 (*)    All other  components within normal limits  COMPREHENSIVE METABOLIC PANEL - Abnormal; Notable for the following:    Glucose, Bld 104 (*)    Total Bilirubin 0.2 (*)    GFR calc non Af Amer 66 (*)    GFR calc Af Amer 76 (*)    All other components within normal limits  LIPASE, BLOOD   No results found.  1. Abdominal pain     MDM  6:54 PM Labs unremarkable. Vitals stable and patient afebrile. Patient likely having pain from gastritis. Vitals stable and patient afebrile. I doubt infectious process at this time. Patient reports complete relief of his symptoms and is ready to go. Patient instructed to return with worsening or concerning symptoms.   Emilia Beck, [place] 03/16/13 1904

## 2013-03-16 NOTE — ED Notes (Signed)
Pt has h/o pancreatitis and ETOH abuse.  Pt consumed ETOH yesterday and has abdominal pain of 7/10 that began this morning.  Patient denies fevers/N/V/D.

## 2013-03-17 NOTE — ED Provider Notes (Signed)
Medical screening examination/treatment/procedure(s) were performed by non-physician practitioner and as supervising physician I was immediately available for consultation/collaboration.   Audree Camel, MD 03/17/13 1128

## 2013-03-23 ENCOUNTER — Encounter (HOSPITAL_COMMUNITY): Payer: Self-pay | Admitting: Emergency Medicine

## 2013-03-23 ENCOUNTER — Emergency Department (HOSPITAL_COMMUNITY): Payer: Medicaid Other

## 2013-03-23 ENCOUNTER — Emergency Department (HOSPITAL_COMMUNITY)
Admission: EM | Admit: 2013-03-23 | Discharge: 2013-03-23 | Disposition: A | Discharge status: Home / Self Care | Payer: Medicaid Other | Attending: Emergency Medicine | Admitting: Emergency Medicine

## 2013-03-23 DIAGNOSIS — R109 Unspecified abdominal pain: Secondary | ICD-10-CM

## 2013-03-23 DIAGNOSIS — R1013 Epigastric pain: Secondary | ICD-10-CM | POA: Insufficient documentation

## 2013-03-23 DIAGNOSIS — Z79899 Other long term (current) drug therapy: Secondary | ICD-10-CM | POA: Insufficient documentation

## 2013-03-23 DIAGNOSIS — F17200 Nicotine dependence, unspecified, uncomplicated: Secondary | ICD-10-CM | POA: Insufficient documentation

## 2013-03-23 DIAGNOSIS — K219 Gastro-esophageal reflux disease without esophagitis: Secondary | ICD-10-CM | POA: Insufficient documentation

## 2013-03-23 DIAGNOSIS — Z9889 Other specified postprocedural states: Secondary | ICD-10-CM | POA: Insufficient documentation

## 2013-03-23 LAB — COMPREHENSIVE METABOLIC PANEL
ALT: 11 U/L (ref 0–53)
Calcium: 9.5 mg/dL (ref 8.4–10.5)
GFR calc non Af Amer: 82 mL/min — ABNORMAL LOW (ref >90)
Glucose, Bld: 110 mg/dL — ABNORMAL HIGH (ref 70–99)
Sodium: 135 mEq/L (ref 135–145)
Total Protein: 7.7 g/dL (ref 6.0–8.3)

## 2013-03-23 LAB — ETHANOL: Alcohol, Ethyl (B): <11 mg/dL (ref 0–11)

## 2013-03-23 LAB — CBC WITH DIFFERENTIAL/PLATELET
Basophils Absolute: 0 10*3/uL (ref 0.0–0.1)
Basophils Relative: 0 % (ref 0–1)
Eosinophils Absolute: 0 10*3/uL (ref 0.0–0.7)
Eosinophils Relative: 0 % (ref 0–5)
Lymphs Abs: 1.1 10*3/uL (ref 0.7–4.0)
MCH: 24.1 pg — ABNORMAL LOW (ref 26.0–34.0)
MCHC: 32.9 g/dL (ref 30.0–36.0)
MCV: 73.3 fL — ABNORMAL LOW (ref 78.0–100.0)
Platelets: 207 10*3/uL (ref 150–400)
RDW: 15.4 % (ref 11.5–15.5)

## 2013-03-23 LAB — RAPID URINE DRUG SCREEN, HOSP PERFORMED
Amphetamines: NOT DETECTED
Opiates: NOT DETECTED
Tetrahydrocannabinol: NOT DETECTED

## 2013-03-23 LAB — LIPASE, BLOOD: Lipase: 17 U/L (ref 11–59)

## 2013-03-23 MED ORDER — OMEPRAZOLE 20 MG PO CPDR: 20.0000 mg | DELAYED_RELEASE_CAPSULE | Freq: Every day | ORAL | Status: DC

## 2013-03-23 MED ORDER — MORPHINE SULFATE 4 MG/ML IJ SOLN
4.0000 mg | Freq: Once | INTRAMUSCULAR | Status: AC
  Administered 2013-03-23: 4 mg via INTRAVENOUS
  Filled 2013-03-23: qty 1

## 2013-03-23 MED ORDER — SODIUM CHLORIDE 0.9 % IV BOLUS (SEPSIS)
1000.0000 mL | Freq: Once | INTRAVENOUS | Status: AC
  Administered 2013-03-23: 1000 mL via INTRAVENOUS

## 2013-03-23 MED ORDER — ONDANSETRON HCL 4 MG/2ML IJ SOLN
4.0000 mg | Freq: Once | INTRAMUSCULAR | Status: AC
  Administered 2013-03-23: 4 mg via INTRAVENOUS
  Filled 2013-03-23: qty 2

## 2013-03-23 NOTE — ED Notes (Signed)
EMS reports pt is from home. Abdominal pain started yesterday and has progressively gotten worse. Pt has hx of pancreatitis and had a flare up last Saturday. Pt reports pain feels the same. Pt drank 3 beers this morning. Pt is A&O and ambulated to room. Reports pain 10/10.

## 2013-03-23 NOTE — ED Provider Notes (Signed)
CSN: [id number]     Arrival date & time 03/23/13  1754 History     First MD Initiated Contact with Patient 03/23/13 1756     Chief Complaint  Patient presents with  . Abdominal Pain   (Consider location/radiation/quality/duration/timing/severity/associated sxs/prior Treatment) HPI Comments: Patient presents to the emergency department with chief complaint of abdominal pain. She states that the abdominal pain started this morning. It is located in the epigastric region. He states that he has a history of pancreatitis. He states that he drank 3 beers this morning. Nothing makes the pain better or worse. The pain does not radiate. He denies any fevers, chills, nausea, or vomiting.  The history is provided by the patient. No language interpreter was used.    Past Medical History  Diagnosis Date  . Abdominal  pain, other specified site   . GERD (gastroesophageal reflux disease)   . Abnormal CT scan, esophagus 03/2012  . Substance abuse 03/2012    tox screen positive for cocaine.    Past Surgical History  Procedure Laterality Date  . Undescended testicle      on right. noted on 03/2012 CT scan.  no surgery referral.   . Esophagogastroduodenoscopy  08/10/2012    Procedure: ESOPHAGOGASTRODUODENOSCOPY (EGD);  Surgeon: [name], MD;  Location: [facility] ENDOSCOPY;  Service: Endoscopy;  Laterality: N/A;  will be done in room 1   . Laparotomy  08/10/2012    Procedure: EXPLORATORY LAPAROTOMY;  Surgeon: [name], MD;  Location: [facility] OR;  Service: General;  Laterality: N/A;   No family history on file. History  Substance Use Topics  . Smoking status: Current Every Day Smoker -- 1.00 packs/day for 34 years  . Smokeless tobacco: Never Used  . Alcohol Use: Yes     Comment: 40oz beer/day    Review of Systems  All other systems reviewed and are negative.    Allergies  Peanut-containing drug products and Spinach  Home Medications   Current Outpatient Rx  Name  Route  Sig  Dispense   Refill  . ibuprofen (ADVIL,MOTRIN) 200 MG tablet   Oral   Take 400 mg by mouth every 6 (six) hours as needed for pain.         . naproxen sodium (ANAPROX) 220 MG tablet   Oral   Take 440 mg by mouth 2 (two) times daily with a meal.         . omeprazole (PRILOSEC) 20 MG capsule   Oral   Take 1 capsule (20 mg total) by mouth daily.   30 capsule   0    BP 158/82  Pulse 66  Temp(Src) 98.5 F (36.9 C) (Oral)  Resp 20  SpO2 97% Physical Exam  Nursing note and vitals reviewed. Constitutional: He is oriented to person, place, and time. He appears well-developed and well-nourished.  HENT:  Head: Normocephalic and atraumatic.  Right Ear: External ear normal.  Left Ear: External ear normal.  Nose: Nose normal.  Mouth/Throat: Oropharynx is clear and moist. No oropharyngeal exudate.  Eyes: Conjunctivae and EOM are normal. Pupils are equal, round, and reactive to light. Right eye exhibits no discharge. Left eye exhibits no discharge. No scleral icterus.  Neck: Normal range of motion. Neck supple. No JVD present.  Cardiovascular: Normal rate, regular rhythm, normal heart sounds and intact distal pulses.  Exam reveals no gallop and no friction rub.   No murmur heard. Pulmonary/Chest: Effort normal and breath sounds normal. No respiratory distress. He has no wheezes.  He has no rales. He exhibits no tenderness.  Abdominal: Soft. Bowel sounds are normal. He exhibits no distension and no mass. There is tenderness. There is no rebound and no guarding.  Epigastric abdominal tenderness, no other focal abdominal tenderness, no fluid wave, or signs surgical abdomen  Musculoskeletal: Normal range of motion. He exhibits no edema and no tenderness.  Neurological: He is alert and oriented to person, place, and time. He has normal reflexes.  CN 3-12 intact  Skin: Skin is warm and dry.  Psychiatric: He has a normal mood and affect. His behavior is normal. Judgment and thought content normal.    ED  Course   Procedures (including critical care time)  Labs Reviewed  CBC WITH DIFFERENTIAL  COMPREHENSIVE METABOLIC PANEL  LIPASE, BLOOD  URINE RAPID DRUG SCREEN (HOSP PERFORMED)  ETHANOL   Results for orders placed during the hospital encounter of 03/23/13  CBC WITH DIFFERENTIAL      Result Value Range   WBC 10.8 (*) 4.0 - 10.5 K/uL   RBC 5.31  4.22 - 5.81 MIL/uL   Hemoglobin 12.8 (*) 13.0 - 17.0 g/dL   HCT 09.8 (*) 11.9 - 14.7 %   MCV 73.3 (*) 78.0 - 100.0 fL   MCH 24.1 (*) 26.0 - 34.0 pg   MCHC 32.9  30.0 - 36.0 g/dL   RDW 82.9  56.2 - 13.0 %   Platelets 207  150 - 400 K/uL   Neutrophils Relative % 82 (*) 43 - 77 %   Neutro Abs 8.9 (*) 1.7 - 7.7 K/uL   Lymphocytes Relative 10 (*) 12 - 46 %   Lymphs Abs 1.1  0.7 - 4.0 K/uL   Monocytes Relative 8  3 - 12 %   Monocytes Absolute 0.8  0.1 - 1.0 K/uL   Eosinophils Relative 0  0 - 5 %   Eosinophils Absolute 0.0  0.0 - 0.7 K/uL   Basophils Relative 0  0 - 1 %   Basophils Absolute 0.0  0.0 - 0.1 K/uL  COMPREHENSIVE METABOLIC PANEL      Result Value Range   Sodium 135  135 - 145 mEq/L   Potassium 3.6  3.5 - 5.1 mEq/L   Chloride 94 (*) 96 - 112 mEq/L   CO2 31  19 - 32 mEq/L   Glucose, Bld 110 (*) 70 - 99 mg/dL   BUN 9  6 - 23 mg/dL   Creatinine, Ser 8.65  0.50 - 1.35 mg/dL   Calcium 9.5  8.4 - 78.4 mg/dL   Total Protein 7.7  6.0 - 8.3 g/dL   Albumin 3.9  3.5 - 5.2 g/dL   AST 21  0 - 37 U/L   ALT 11  0 - 53 U/L   Alkaline Phosphatase 74  39 - 117 U/L   Total Bilirubin 0.5  0.3 - 1.2 mg/dL   GFR calc non Af Amer 82 (*) >90 mL/min   GFR calc Af Amer >90  >90 mL/min  LIPASE, BLOOD      Result Value Range   Lipase 17  11 - 59 U/L  URINE RAPID DRUG SCREEN (HOSP PERFORMED)      Result Value Range   Opiates NONE DETECTED  NONE DETECTED   Cocaine POSITIVE (*) NONE DETECTED   Benzodiazepines NONE DETECTED  NONE DETECTED   Amphetamines NONE DETECTED  NONE DETECTED   Tetrahydrocannabinol NONE DETECTED  NONE DETECTED    Barbiturates NONE DETECTED  NONE DETECTED  ETHANOL  Result Value Range   Alcohol, Ethyl (B) <11  0 - 11 mg/dL   Dg Abd Acute W/chest  03/23/2013   CLINICAL DATA:  Abdominal pain, similar to previous bouts of pancreatitis. Smoker.  EXAM: ACUTE ABDOMEN SERIES (ABDOMEN 2 VIEW & CHEST 1 VIEW)  COMPARISON:  01/26/2013.  FINDINGS: The heart remains normal in size. Stable pleural and parenchymal scarring in the left upper lobe. Stable mild diffuse peribronchial thickening.  Normal bowel gas pattern without free peritoneal air. Normal amount of stool. Minimal scoliosis.  IMPRESSION: No acute abnormality. Stable left upper lobe pleural and parenchymal scarring and diffuse chronic bronchitic changes.   Electronically Signed   By: Gordan Payment   On: 03/23/2013 19:04     1. Abdominal pain     MDM  Patient with epigastric abdominal pain. Will check basic labs, and will reevaluate.  Labs are unremarkable. Patient states his pain is improved with morphine. He is not in any apparent distress. He states that he drank alcohol this morning, but ethanol is less than 11 now. No evidence of DTs. No evidence of SBO.  Discussed the patient with Dr. Juleen China, who agrees with the plan. The patient is stable and ready for discharge home.  Roxy Horseman, PA-C 03/23/13 1922

## 2013-03-25 ENCOUNTER — Emergency Department (HOSPITAL_COMMUNITY)
Admission: EM | Admit: 2013-03-25 | Discharge: 2013-03-25 | Disposition: A | Discharge status: Home / Self Care | Payer: Medicaid Other | Attending: Emergency Medicine | Admitting: Emergency Medicine

## 2013-03-25 ENCOUNTER — Encounter (HOSPITAL_COMMUNITY): Payer: Self-pay | Admitting: Emergency Medicine

## 2013-03-25 DIAGNOSIS — Z9889 Other specified postprocedural states: Secondary | ICD-10-CM | POA: Insufficient documentation

## 2013-03-25 DIAGNOSIS — Z79899 Other long term (current) drug therapy: Secondary | ICD-10-CM | POA: Insufficient documentation

## 2013-03-25 DIAGNOSIS — I10 Essential (primary) hypertension: Secondary | ICD-10-CM | POA: Insufficient documentation

## 2013-03-25 DIAGNOSIS — K219 Gastro-esophageal reflux disease without esophagitis: Secondary | ICD-10-CM | POA: Insufficient documentation

## 2013-03-25 DIAGNOSIS — K2970 Gastritis, unspecified, without bleeding: Secondary | ICD-10-CM | POA: Insufficient documentation

## 2013-03-25 DIAGNOSIS — F17200 Nicotine dependence, unspecified, uncomplicated: Secondary | ICD-10-CM | POA: Insufficient documentation

## 2013-03-25 LAB — CBC WITH DIFFERENTIAL/PLATELET
Basophils Absolute: 0 10*3/uL (ref 0.0–0.1)
Lymphocytes Relative: 46 % (ref 12–46)
Lymphs Abs: 2.1 10*3/uL (ref 0.7–4.0)
Neutro Abs: 2 10*3/uL (ref 1.7–7.7)
Neutrophils Relative %: 44 % (ref 43–77)
Platelets: 5 10*3/uL — CL (ref 150–400)
RBC: 5.2 MIL/uL (ref 4.22–5.81)
RDW: 15.6 % — ABNORMAL HIGH (ref 11.5–15.5)
WBC: 4.5 10*3/uL (ref 4.0–10.5)

## 2013-03-25 LAB — URINALYSIS, ROUTINE W REFLEX MICROSCOPIC
Glucose, UA: NEGATIVE mg/dL
Ketones, ur: NEGATIVE mg/dL
Nitrite: NEGATIVE
Specific Gravity, Urine: 1.014 (ref 1.005–1.030)
Urobilinogen, UA: 1 mg/dL (ref 0.0–1.0)
pH: 7.5 (ref 5.0–8.0)

## 2013-03-25 LAB — BASIC METABOLIC PANEL
CO2: 27 mEq/L (ref 19–32)
Calcium: 9.1 mg/dL (ref 8.4–10.5)
Creatinine, Ser: 1.09 mg/dL (ref 0.50–1.35)
GFR calc Af Amer: 88 mL/min — ABNORMAL LOW (ref >90)
Sodium: 136 mEq/L (ref 135–145)

## 2013-03-25 LAB — RAPID URINE DRUG SCREEN, HOSP PERFORMED
Amphetamines: NOT DETECTED
Benzodiazepines: NOT DETECTED
Cocaine: POSITIVE — AB

## 2013-03-25 LAB — ETHANOL: Alcohol, Ethyl (B): <11 mg/dL (ref 0–11)

## 2013-03-25 LAB — LIPASE, BLOOD: Lipase: 35 U/L (ref 11–59)

## 2013-03-25 MED ORDER — GI COCKTAIL ~~LOC~~
30.0000 mL | Freq: Once | ORAL | Status: AC
  Administered 2013-03-25: 30 mL via ORAL
  Filled 2013-03-25: qty 30

## 2013-03-25 MED ORDER — ALUM & MAG HYDROXIDE-SIMETH 200-200-20 MG/5ML PO SUSP
30.0000 mL | Freq: Once | ORAL | Status: AC
  Administered 2013-03-25: 30 mL via ORAL
  Filled 2013-03-25: qty 30

## 2013-03-25 MED ORDER — ACETAMINOPHEN 325 MG PO TABS
650.0000 mg | ORAL_TABLET | Freq: Once | ORAL | Status: AC
  Administered 2013-03-25: 650 mg via ORAL
  Filled 2013-03-25: qty 2

## 2013-03-25 NOTE — Progress Notes (Signed)
Patient confirms his pcp is at [facility] on [address]  Patient does not see any doctor in particular there.

## 2013-03-25 NOTE — ED Notes (Signed)
[place]  Pt is complaining of abdominal pain.  Pt is located medially under the umbilicus.  Pt is unable to sit as it causes him too much pain.

## 2013-03-25 NOTE — ED Provider Notes (Signed)
CSN: [id number]     Arrival date & time 03/25/13  1957 History   First MD Initiated Contact with Patient 03/25/13 2032     Chief Complaint  Patient presents with  . Abdominal Pain   (Consider location/radiation/quality/duration/timing/severity/associated sxs/prior Treatment) HPI Comments: [name] is a 52 y.o. Male who presents for evaluation of recurrent abdominal pain. He was evaluated by me at 21:30. At this time, he, states that the pain is going away. He denies fever, chills, nausea, vomiting, weakness, or dizziness. He was here 2 days ago and at that time diagnosed with gastritis. He was given a prescription for omeprazole, but has not been able to fill it yet. He has no other complaints. He is a known abuser of alcohol and cocaine. There are no other known modifying factors.  Patient is a 52 y.o. male presenting with abdominal pain. The history is provided by the patient.  Abdominal Pain   Past Medical History  Diagnosis Date  . Abdominal  pain, other specified site   . GERD (gastroesophageal reflux disease)   . Abnormal CT scan, esophagus 03/2012  . Substance abuse 03/2012    tox screen positive for cocaine.    Past Surgical History  Procedure Laterality Date  . Undescended testicle      on right. noted on 03/2012 CT scan.  no surgery referral.   . Esophagogastroduodenoscopy  08/10/2012    Procedure: ESOPHAGOGASTRODUODENOSCOPY (EGD);  Surgeon: [name], MD;  Location: [facility] ENDOSCOPY;  Service: Endoscopy;  Laterality: N/A;  will be done in room 1   . Laparotomy  08/10/2012    Procedure: EXPLORATORY LAPAROTOMY;  Surgeon: [name], MD;  Location: [facility] OR;  Service: General;  Laterality: N/A;   History reviewed. No pertinent family history. History  Substance Use Topics  . Smoking status: Current Every Day Smoker -- 1.00 packs/day for 34 years  . Smokeless tobacco: Never Used  . Alcohol Use: Yes     Comment: 40oz beer/day    Review of Systems  Gastrointestinal:  Positive for abdominal pain.  All other systems reviewed and are negative.    Allergies  Peanut-containing drug products and Spinach  Home Medications   Current Outpatient Rx  Name  Route  Sig  Dispense  Refill  . ibuprofen (ADVIL,MOTRIN) 200 MG tablet   Oral   Take 400 mg by mouth every 6 (six) hours as needed for pain.         . naproxen sodium (ANAPROX) 220 MG tablet   Oral   Take 440 mg by mouth 2 (two) times daily with a meal.         . omeprazole (PRILOSEC) 20 MG capsule   Oral   Take 1 capsule (20 mg total) by mouth daily.   30 capsule   0    BP 144/82  Pulse 58  Temp(Src) 98.4 F (36.9 C) (Oral)  Resp 12  Ht 5\' 9"  (1.753 m)  Wt 165 lb (74.844 kg)  BMI 24.36 kg/m2  SpO2 93% Physical Exam  Nursing note and vitals reviewed. Constitutional: He is oriented to person, place, and time. He appears well-developed and well-nourished.  HENT:  Head: Normocephalic and atraumatic.  Right Ear: External ear normal.  Left Ear: External ear normal.  Eyes: Conjunctivae and EOM are normal. Pupils are equal, round, and reactive to light.  Neck: Normal range of motion and phonation normal. Neck supple.  Cardiovascular: Normal rate, regular rhythm, normal heart sounds and intact distal pulses.  He is hypertensive  Pulmonary/Chest: Effort normal and breath sounds normal. He exhibits no bony tenderness.  Abdominal: Soft. Normal appearance. There is no tenderness.  Musculoskeletal: Normal range of motion.  Neurological: He is alert and oriented to person, place, and time. He has normal strength. No cranial nerve deficit or sensory deficit. He exhibits normal muscle tone. Coordination normal.  Skin: Skin is warm, dry and intact.  Psychiatric: He has a normal mood and affect. His behavior is normal. Judgment and thought content normal.    ED Course  Procedures (including critical care time) Medications  acetaminophen (TYLENOL) tablet 650 mg (not administered)  alum & mag  hydroxide-simeth (MAALOX/MYLANTA) 200-200-20 MG/5ML suspension 30 mL (not administered)  gi cocktail (Maalox,Lidocaine,Donnatal) (30 mLs Oral Given 03/25/13 2135)    Patient Vitals for the past 24 hrs:  BP Temp Temp src Pulse Resp SpO2 Height Weight  03/25/13 2343 144/82 mmHg 98.4 F (36.9 C) Oral 58 12 93 % - -  03/25/13 2001 161/102 mmHg 98.1 F (36.7 C) - 65 24 98 % 5\' 9"  (1.753 m) 165 lb (74.844 kg)       Labs Review Labs Reviewed  BASIC METABOLIC PANEL - Abnormal; Notable for the following:    GFR calc non Af Amer 76 (*)    GFR calc Af Amer 88 (*)    All other components within normal limits  CBC WITH DIFFERENTIAL - Abnormal; Notable for the following:    Hemoglobin 12.2 (*)    HCT 38.1 (*)    MCV 73.3 (*)    MCH 23.5 (*)    RDW 15.6 (*)    Platelets 5 (*)    All other components within normal limits  URINALYSIS, ROUTINE W REFLEX MICROSCOPIC - Abnormal; Notable for the following:    Leukocytes, UA SMALL (*)    All other components within normal limits  URINE RAPID DRUG SCREEN (HOSP PERFORMED) - Abnormal; Notable for the following:    Cocaine POSITIVE (*)    All other components within normal limits  LIPASE, BLOOD  ETHANOL  URINE MICROSCOPIC-ADD ON  PLATELET COUNT    Imaging Review No results found.  MDM   1. Gastritis       Nonspecific abdominal pain, most consistent with gastritis. No evidence for acute pancreatitis, acute intra-abdominal infection, metabolic instability, or blood dyscrasia. He, stable for discharge with outpatient management. He is encouraged to stop using illicit substances  Nursing Notes Reviewed/ Care Coordinated, and agree without changes. Applicable Imaging Reviewed.  Interpretation of Laboratory Data incorporated into ED treatment   Plan: Home Medications- Antacid of choice or previously prescribed Omeprazole; Home Treatments and Observation- avoid EtOH and Cocaine; return here if the recommended treatment, does not improve the  symptoms; Recommended follow up- PCP of choice prn.      Flint Melter, MD 03/25/13 [phone]

## 2013-03-28 NOTE — ED Provider Notes (Signed)
Medical screening examination/treatment/procedure(s) were performed by non-physician practitioner and as supervising physician I was immediately available for consultation/collaboration.  Joury Allcorn, MD 03/28/13 0714 

## 2013-04-08 ENCOUNTER — Encounter (HOSPITAL_COMMUNITY): Payer: Self-pay

## 2013-04-08 ENCOUNTER — Emergency Department (HOSPITAL_COMMUNITY)
Admission: EM | Admit: 2013-04-08 | Discharge: 2013-04-09 | Disposition: A | Discharge status: Home / Self Care | Payer: Medicaid Other | Attending: Emergency Medicine | Admitting: Emergency Medicine

## 2013-04-08 DIAGNOSIS — F17200 Nicotine dependence, unspecified, uncomplicated: Secondary | ICD-10-CM | POA: Insufficient documentation

## 2013-04-08 DIAGNOSIS — K219 Gastro-esophageal reflux disease without esophagitis: Secondary | ICD-10-CM | POA: Insufficient documentation

## 2013-04-08 DIAGNOSIS — Z791 Long term (current) use of non-steroidal anti-inflammatories (NSAID): Secondary | ICD-10-CM | POA: Insufficient documentation

## 2013-04-08 DIAGNOSIS — R1013 Epigastric pain: Secondary | ICD-10-CM | POA: Insufficient documentation

## 2013-04-08 DIAGNOSIS — Z9889 Other specified postprocedural states: Secondary | ICD-10-CM | POA: Insufficient documentation

## 2013-04-08 DIAGNOSIS — R109 Unspecified abdominal pain: Secondary | ICD-10-CM

## 2013-04-08 DIAGNOSIS — Z79899 Other long term (current) drug therapy: Secondary | ICD-10-CM | POA: Insufficient documentation

## 2013-04-08 LAB — CBC WITH DIFFERENTIAL/PLATELET
Eosinophils Absolute: 0.1 10*3/uL (ref 0.0–0.7)
Eosinophils Relative: 2 % (ref 0–5)
HCT: 37.1 % — ABNORMAL LOW (ref 39.0–52.0)
Lymphocytes Relative: 20 % (ref 12–46)
Lymphs Abs: 1.3 10*3/uL (ref 0.7–4.0)
MCH: 23.8 pg — ABNORMAL LOW (ref 26.0–34.0)
MCV: 73.5 fL — ABNORMAL LOW (ref 78.0–100.0)
Monocytes Absolute: 0.7 10*3/uL (ref 0.1–1.0)
Monocytes Relative: 11 % (ref 3–12)
Platelets: 193 10*3/uL (ref 150–400)
RBC: 5.05 MIL/uL (ref 4.22–5.81)
WBC: 6.5 10*3/uL (ref 4.0–10.5)

## 2013-04-08 LAB — COMPREHENSIVE METABOLIC PANEL
ALT: 14 U/L (ref 0–53)
BUN: 11 mg/dL (ref 6–23)
CO2: 29 mEq/L (ref 19–32)
Calcium: 9.4 mg/dL (ref 8.4–10.5)
Creatinine, Ser: 0.99 mg/dL (ref 0.50–1.35)
GFR calc Af Amer: >90 mL/min (ref >90)
GFR calc non Af Amer: >90 mL/min (ref >90)
Glucose, Bld: 108 mg/dL — ABNORMAL HIGH (ref 70–99)
Total Protein: 7.2 g/dL (ref 6.0–8.3)

## 2013-04-08 LAB — LIPASE, BLOOD: Lipase: 26 U/L (ref 11–59)

## 2013-04-08 MED ORDER — POTASSIUM CHLORIDE CRYS ER 20 MEQ PO TBCR
40.0000 meq | EXTENDED_RELEASE_TABLET | Freq: Once | ORAL | Status: AC
  Administered 2013-04-09: 40 meq via ORAL
  Filled 2013-04-08: qty 2

## 2013-04-08 MED ORDER — ACETAMINOPHEN 325 MG PO TABS: 650.0000 mg | ORAL_TABLET | Freq: Once | ORAL | Status: DC

## 2013-04-08 MED ORDER — GI COCKTAIL ~~LOC~~
30.0000 mL | Freq: Once | ORAL | Status: AC
  Administered 2013-04-09: 30 mL via ORAL
  Filled 2013-04-08: qty 30

## 2013-04-08 MED ORDER — METOCLOPRAMIDE HCL 5 MG/ML IJ SOLN
10.0000 mg | Freq: Once | INTRAMUSCULAR | Status: AC
  Administered 2013-04-09: 10 mg via INTRAMUSCULAR
  Filled 2013-04-08: qty 2

## 2013-04-08 NOTE — ED Provider Notes (Signed)
CSN: [id number]     Arrival date & time 04/08/13  2124 History   First MD Initiated Contact with Patient 04/08/13 2134     Chief Complaint  Patient presents with  . Abdominal Pain    several days with increased today left umbilcal   (Consider location/radiation/quality/duration/timing/severity/associated sxs/prior Treatment) HPI Comments: Patient is a 52 year old male past medical history significant for chronic abdominal pain, GERD, polysubstance abuse presented to the emergency department for flareup of his chronic epigastric pain that began this morning. Patient states it is a sharp pain with radiation all over the belly. He rates his pain 6/10. He denies meal he really [place] factors. Patient states his pain has improved since his arrival in the emergency department. He denies any fevers, chills, nausea, diarrhea, urinary symptoms. Patient endorses one episode of emesis was nonbloody nonbilious. Patient has an abdominal surgical history include exploratory laparotomy. Patient does have a history of alcohol and recreational drug abuse. Patient does endorse drinking a few beers. Patient states this feels like typical chronic abdominal pain flareup.  Patient is a 52 y.o. male presenting with abdominal pain.  Abdominal Pain Associated symptoms: no chest pain, no chills, no fever, no nausea and no shortness of breath     Past Medical History  Diagnosis Date  . Abdominal  pain, other specified site   . GERD (gastroesophageal reflux disease)   . Abnormal CT scan, esophagus 03/2012  . Substance abuse 03/2012    tox screen positive for cocaine.    Past Surgical History  Procedure Laterality Date  . Undescended testicle      on right. noted on 03/2012 CT scan.  no surgery referral.   . Esophagogastroduodenoscopy  08/10/2012    Procedure: ESOPHAGOGASTRODUODENOSCOPY (EGD);  Surgeon: [name], MD;  Location: [facility] ENDOSCOPY;  Service: Endoscopy;  Laterality: N/A;  will be done in room 1   .  Laparotomy  08/10/2012    Procedure: EXPLORATORY LAPAROTOMY;  Surgeon: [name], MD;  Location: [facility] OR;  Service: General;  Laterality: N/A;   No family history on file. History  Substance Use Topics  . Smoking status: Current Every Day Smoker -- 1.00 packs/day for 34 years  . Smokeless tobacco: Never Used  . Alcohol Use: Yes     Comment: 40oz beer/day    Review of Systems  Constitutional: Negative for fever and chills.  Respiratory: Negative for shortness of breath.   Cardiovascular: Negative for chest pain.  Gastrointestinal: Positive for abdominal pain. Negative for nausea.  All other systems reviewed and are negative.    Allergies  Chocolate; Peanut-containing drug products; and Spinach  Home Medications   Current Outpatient Rx  Name  Route  Sig  Dispense  Refill  . ibuprofen (ADVIL,MOTRIN) 200 MG tablet   Oral   Take 400 mg by mouth every 6 (six) hours as needed for pain.         . naproxen sodium (ANAPROX) 220 MG tablet   Oral   Take 440 mg by mouth 2 (two) times daily with a meal.         . omeprazole (PRILOSEC) 20 MG capsule   Oral   Take 1 capsule (20 mg total) by mouth daily.   30 capsule   0   . ondansetron (ZOFRAN ODT) 4 MG disintegrating tablet   Oral   Take 1 tablet (4 mg total) by mouth every 8 (eight) hours as needed for nausea.   10 tablet   0  BP 178/105  Pulse 67  Temp(Src) 98.5 F (36.9 C) (Oral)  Resp 16  SpO2 97% Physical Exam  Constitutional: He is oriented to person, place, and time. He appears well-developed and well-nourished. No distress.  HENT:  Head: Normocephalic and atraumatic.  Right Ear: External ear normal.  Left Ear: External ear normal.  Nose: Nose normal.  Eyes: Conjunctivae are normal.  Neck: Normal range of motion. Neck supple.  Cardiovascular: Normal rate, regular rhythm and normal heart sounds.   Pulmonary/Chest: Effort normal and breath sounds normal. No respiratory distress.  Abdominal: Soft. He  exhibits no distension. There is tenderness in the epigastric area. There is no rigidity, no rebound and no guarding.  Musculoskeletal: Normal range of motion.  Neurological: He is alert and oriented to person, place, and time. He has normal strength. Gait normal. GCS eye subscore is 4. GCS verbal subscore is 5. GCS motor subscore is 6.  Skin: Skin is warm and dry. He is not diaphoretic.    ED Course  Procedures (including critical care time)  Medications  metoCLOPramide (REGLAN) injection 10 mg (10 mg Intramuscular Given 04/09/13 0008)  gi cocktail (Maalox,Lidocaine,Donnatal) (30 mLs Oral Given 04/09/13 0008)  potassium chloride SA (K-DUR,KLOR-CON) CR tablet 40 mEq (40 mEq Oral Given 04/09/13 0007)     Labs Review Labs Reviewed  CBC WITH DIFFERENTIAL - Abnormal; Notable for the following:    Hemoglobin 12.0 (*)    HCT 37.1 (*)    MCV 73.5 (*)    MCH 23.8 (*)    RDW 16.8 (*)    All other components within normal limits  COMPREHENSIVE METABOLIC PANEL - Abnormal; Notable for the following:    Potassium 3.4 (*)    Glucose, Bld 108 (*)    All other components within normal limits  URINALYSIS, ROUTINE W REFLEX MICROSCOPIC - Abnormal; Notable for the following:    Leukocytes, UA SMALL (*)    All other components within normal limits  LIPASE, BLOOD  URINE RAPID DRUG SCREEN (HOSP PERFORMED)  ETHANOL  URINE MICROSCOPIC-ADD ON   Imaging Review No results found.  MDM   1. Recurrent abdominal pain    Afebrile, NAD, non-toxic appearing, AAOx4. Patient is nontoxic, nonseptic appearing, in no apparent distress.  On reviewing patient's chart he has been to the ED several times for the same complaint with negative work ups without change in symptoms. Patient's pain and other symptoms adequately managed in emergency department.  PO intake tolerated in ED.  Labs, nursing notes, and vitals reviewed.  Patient does not meet the SIRS or Sepsis criteria.  On repeat exam patient does not have a surgical  abdomen and there are nor peritoneal signs.  No indication of appendicitis, bowel obstruction, bowel perforation, cholecystitis, diverticulitis.  Patient discharged home with symptomatic treatment and given strict instructions for follow-up with their primary care physician.  I have also discussed reasons to return immediately to the ER.  Patient expresses understanding and agrees with plan. Patient d/w with Dr. Oletta Lamas, agrees with plan. Patient is stable at time of discharge          Jeannetta Ellis, PA-C 04/09/13 0129

## 2013-04-08 NOTE — ED Notes (Signed)
Per [facility] stomach pain for several day increased today. Left umbilical only- c/o nausea denies vomiting fever.and diarrhea

## 2013-04-08 NOTE — ED Notes (Signed)
Entered patient's room to administer medications. Patient was laying in the floor. Patient states he laid in the floor because he was uncomfortable in the bed. Patient was requested to undress and place a hospital gown on, and return to the bed, patient informed I will not give him medications if he is laying in the floor. Patient advised I will come back around shortly to see if he is ready for his medications at that time.

## 2013-04-08 NOTE — ED Notes (Signed)
Bed: WA02 Expected date: 04/08/13 Expected time: 9:15 PM Means of arrival: Ambulance Comments: abd pain

## 2013-04-09 LAB — URINALYSIS, ROUTINE W REFLEX MICROSCOPIC
Hgb urine dipstick: NEGATIVE
Nitrite: NEGATIVE
Protein, ur: NEGATIVE mg/dL
Specific Gravity, Urine: 1.014 (ref 1.005–1.030)
Urobilinogen, UA: 0.2 mg/dL (ref 0.0–1.0)

## 2013-04-09 LAB — URINE MICROSCOPIC-ADD ON

## 2013-04-09 LAB — RAPID URINE DRUG SCREEN, HOSP PERFORMED
Cocaine: NOT DETECTED
Opiates: NOT DETECTED
Tetrahydrocannabinol: NOT DETECTED

## 2013-04-09 MED ORDER — ONDANSETRON 4 MG PO TBDP: 4.0000 mg | ORAL_TABLET | Freq: Three times a day (TID) | ORAL | Status: DC | PRN

## 2013-04-09 NOTE — ED Notes (Signed)
Patient given bus pass at discharge.  

## 2013-04-09 NOTE — ED Notes (Signed)
Patient remains in room at this time asleep. Patient awakened and reminded that he had been discharged. Patient was requested to put his clothes on and vacate the room. Will con't to monitor that patient leaves room in a timely manner.

## 2013-04-09 NOTE — ED Provider Notes (Signed)
Medical screening examination/treatment/procedure(s) were performed by non-physician practitioner and as supervising physician I was immediately available for consultation/collaboration.   Jessic Standifer Y. Maiana Hennigan, MD 04/09/13 1453 

## 2013-04-13 ENCOUNTER — Emergency Department (HOSPITAL_COMMUNITY)
Admission: EM | Admit: 2013-04-13 | Discharge: 2013-04-13 | Disposition: A | Discharge status: Home / Self Care | Payer: Medicaid Other | Attending: Emergency Medicine | Admitting: Emergency Medicine

## 2013-04-13 ENCOUNTER — Encounter (HOSPITAL_COMMUNITY): Payer: Self-pay | Admitting: *Deleted

## 2013-04-13 DIAGNOSIS — Z79899 Other long term (current) drug therapy: Secondary | ICD-10-CM | POA: Insufficient documentation

## 2013-04-13 DIAGNOSIS — G8929 Other chronic pain: Secondary | ICD-10-CM | POA: Insufficient documentation

## 2013-04-13 DIAGNOSIS — K219 Gastro-esophageal reflux disease without esophagitis: Secondary | ICD-10-CM | POA: Insufficient documentation

## 2013-04-13 DIAGNOSIS — R1084 Generalized abdominal pain: Secondary | ICD-10-CM | POA: Insufficient documentation

## 2013-04-13 DIAGNOSIS — R109 Unspecified abdominal pain: Secondary | ICD-10-CM

## 2013-04-13 DIAGNOSIS — R112 Nausea with vomiting, unspecified: Secondary | ICD-10-CM | POA: Insufficient documentation

## 2013-04-13 DIAGNOSIS — F17200 Nicotine dependence, unspecified, uncomplicated: Secondary | ICD-10-CM | POA: Insufficient documentation

## 2013-04-13 LAB — CBC WITH DIFFERENTIAL/PLATELET
Basophils Absolute: 0 10*3/uL (ref 0.0–0.1)
Basophils Relative: 0 % (ref 0–1)
Hemoglobin: 11.6 g/dL — ABNORMAL LOW (ref 13.0–17.0)
Lymphocytes Relative: 27 % (ref 12–46)
MCHC: 32 g/dL (ref 30.0–36.0)
Monocytes Relative: 13 % — ABNORMAL HIGH (ref 3–12)
Neutro Abs: 4 10*3/uL (ref 1.7–7.7)
Neutrophils Relative %: 58 % (ref 43–77)
RDW: 16.4 % — ABNORMAL HIGH (ref 11.5–15.5)
WBC: 6.8 10*3/uL (ref 4.0–10.5)

## 2013-04-13 LAB — COMPREHENSIVE METABOLIC PANEL
ALT: 11 U/L (ref 0–53)
AST: 23 U/L (ref 0–37)
Albumin: 3.6 g/dL (ref 3.5–5.2)
Alkaline Phosphatase: 68 U/L (ref 39–117)
CO2: 32 mEq/L (ref 19–32)
Chloride: 99 mEq/L (ref 96–112)
Potassium: 3.6 mEq/L (ref 3.5–5.1)
Total Bilirubin: 0.2 mg/dL — ABNORMAL LOW (ref 0.3–1.2)

## 2013-04-13 LAB — URINE MICROSCOPIC-ADD ON

## 2013-04-13 LAB — URINALYSIS, ROUTINE W REFLEX MICROSCOPIC
Bilirubin Urine: NEGATIVE
Glucose, UA: NEGATIVE mg/dL
Hgb urine dipstick: NEGATIVE
Ketones, ur: NEGATIVE mg/dL
pH: 7 (ref 5.0–8.0)

## 2013-04-13 MED ORDER — PROMETHAZINE HCL 25 MG PO TABS: 25.0000 mg | ORAL_TABLET | Freq: Four times a day (QID) | ORAL | Status: DC | PRN

## 2013-04-13 MED ORDER — GI COCKTAIL ~~LOC~~
30.0000 mL | Freq: Once | ORAL | Status: AC
  Administered 2013-04-13: 30 mL via ORAL
  Filled 2013-04-13: qty 30

## 2013-04-13 MED ORDER — TRAMADOL HCL 50 MG PO TABS
50.0000 mg | ORAL_TABLET | Freq: Once | ORAL | Status: AC
  Administered 2013-04-13: 50 mg via ORAL
  Filled 2013-04-13: qty 1

## 2013-04-13 MED ORDER — ONDANSETRON 8 MG PO TBDP
8.0000 mg | ORAL_TABLET | Freq: Once | ORAL | Status: AC
  Administered 2013-04-13: 8 mg via ORAL
  Filled 2013-04-13: qty 1

## 2013-04-13 NOTE — ED Provider Notes (Signed)
CSN: [id number]     Arrival date & time 04/13/13  1544 History   First MD Initiated Contact with Patient 04/13/13 1555     Chief Complaint  Patient presents with  . Abdominal Pain   (Consider location/radiation/quality/duration/timing/severity/associated sxs/prior Treatment) HPI Comments: Patient is a 52 year old male with history of chronic abdominal pain who presents today with one week of gradually worsening abdominal pain. The pain is sharp and diffusely over his abdomen. Eating makes the pain worse. Nothing makes the pain better. He has been seen for this earlier in the week. He did not fill the prescriptions he was given. His last bowel movement was yesterday. There is no melena or hematochezia. He vomited earlier today. His emesis was nonbloody. No fevers, chills, dysuria, urinary urgency, urinary frequency.   The history is provided by the patient. No language interpreter was used.    Past Medical History  Diagnosis Date  . Abdominal  pain, other specified site   . GERD (gastroesophageal reflux disease)   . Abnormal CT scan, esophagus 03/2012  . Substance abuse 03/2012    tox screen positive for cocaine.    Past Surgical History  Procedure Laterality Date  . Undescended testicle      on right. noted on 03/2012 CT scan.  no surgery referral.   . Esophagogastroduodenoscopy  08/10/2012    Procedure: ESOPHAGOGASTRODUODENOSCOPY (EGD);  Surgeon: [name], MD;  Location: [facility] ENDOSCOPY;  Service: Endoscopy;  Laterality: N/A;  will be done in room 1   . Laparotomy  08/10/2012    Procedure: EXPLORATORY LAPAROTOMY;  Surgeon: [name], MD;  Location: [facility] OR;  Service: General;  Laterality: N/A;   No family history on file. History  Substance Use Topics  . Smoking status: Current Every Day Smoker -- 1.00 packs/day for 34 years  . Smokeless tobacco: Never Used  . Alcohol Use: Yes     Comment: 40oz beer/day    Review of Systems  Constitutional: Negative for fever and chills.   Respiratory: Negative for shortness of breath.   Cardiovascular: Negative for chest pain.  Gastrointestinal: Positive for nausea, vomiting and abdominal pain. Negative for diarrhea, constipation and abdominal distention.  Genitourinary: Negative for dysuria, urgency and frequency.  All other systems reviewed and are negative.    Allergies  Chocolate; Peanut-containing drug products; and Spinach  Home Medications   Current Outpatient Rx  Name  Route  Sig  Dispense  Refill  . ibuprofen (ADVIL,MOTRIN) 200 MG tablet   Oral   Take 400 mg by mouth every 6 (six) hours as needed for pain.         . naproxen sodium (ANAPROX) 220 MG tablet   Oral   Take 440 mg by mouth 2 (two) times daily with a meal.         . omeprazole (PRILOSEC) 20 MG capsule   Oral   Take 1 capsule (20 mg total) by mouth daily.   30 capsule   0   . ondansetron (ZOFRAN ODT) 4 MG disintegrating tablet   Oral   Take 1 tablet (4 mg total) by mouth every 8 (eight) hours as needed for nausea.   10 tablet   0    BP 146/85  Pulse 71  Temp(Src) 99.2 F (37.3 C) (Oral)  SpO2 100% Physical Exam  Nursing note and vitals reviewed. Constitutional: He is oriented to person, place, and time. He appears well-developed and well-nourished. No distress.  Patient begins writhing around in pain asking for  pain medicine only after I introduced myself. I visualized him laying comfortably in the bed  HENT:  Head: Normocephalic and atraumatic.  Right Ear: External ear normal.  Left Ear: External ear normal.  Nose: Nose normal.  Eyes: Conjunctivae are normal.  Neck: Normal range of motion. No tracheal deviation present.  Cardiovascular: Normal rate, regular rhythm and normal heart sounds.   Pulmonary/Chest: Effort normal and breath sounds normal. No stridor.  Abdominal: Soft. He exhibits no distension. There is generalized tenderness. There is no rigidity, no rebound and no guarding.  Well healed incision site scar from  exploratory laparotomy. No erythema, swelling, streaking, warmth.   Musculoskeletal: Normal range of motion.  Neurological: He is alert and oriented to person, place, and time.  Skin: Skin is warm and dry. He is not diaphoretic.  Psychiatric: He has a normal mood and affect. His behavior is normal.    ED Course  Procedures (including critical care time) Labs Review Labs Reviewed  CBC WITH DIFFERENTIAL - Abnormal; Notable for the following:    Hemoglobin 11.6 (*)    HCT 36.3 (*)    MCV 72.9 (*)    MCH 23.3 (*)    RDW 16.4 (*)    Monocytes Relative 13 (*)    All other components within normal limits  COMPREHENSIVE METABOLIC PANEL - Abnormal; Notable for the following:    Glucose, Bld 107 (*)    Total Bilirubin 0.2 (*)    GFR calc non Af Amer 78 (*)    All other components within normal limits  URINALYSIS, ROUTINE W REFLEX MICROSCOPIC - Abnormal; Notable for the following:    Leukocytes, UA SMALL (*)    All other components within normal limits  LIPASE, BLOOD  ETHANOL  URINE MICROSCOPIC-ADD ON   5:22 PM Reevaluated the patient. He is sleeping comfortably in the bed. He reports that he did not feel better. Abdomen is soft and nonsurgical. No rebound or guarding.  Imaging Review No results found.  MDM   1. Recurrent abdominal pain    Patient is nontoxic, nonseptic appearing, in no apparent distress.  Patient's pain and other symptoms adequately managed in emergency department.  Labs and vitals reviewed.  Patient does not meet the SIRS or Sepsis criteria.  On repeat exam patient does not have a surgical abdomen and there are no peritoneal signs.  No indication of appendicitis, bowel obstruction, bowel perforation, cholecystitis, diverticulitis.  Patient discharged home with symptomatic treatment and given strict instructions for follow-up with their primary care physician.  I have also discussed reasons to return immediately to the ER.  Patient expresses understanding and agrees  with plan. Discussed case with Dr. Denton Lank who agrees with plan.       Mora Bellman, PA-C 04/13/13 1743

## 2013-04-13 NOTE — ED Notes (Signed)
Per ems: pt from home, was seen at [facility] 9/8 for same symptoms. Pt reports abd that has not gotten better. Reports vomiting once today. EMS states pt able to ambulate to truck in no distress. Pt showed EMS discharge paperwork, had rx for zofran which he did not get filled because he didn't have money. Pain 6/10. bp 150/100, pulse 80

## 2013-04-13 NOTE — ED Notes (Addendum)
Tolerated PO fluids.

## 2013-04-15 NOTE — ED Provider Notes (Signed)
Medical screening examination/treatment/procedure(s) were conducted as a shared visit with non-physician practitioner(s) and myself.  I personally evaluated the patient during the encounter Pt c/o chronic abd pain, upper abd. No fevers. No nv. abd soft nt. Labs.  Suzi Roots, MD 04/15/13 [phone]

## 2013-04-25 ENCOUNTER — Inpatient Hospital Stay (HOSPITAL_COMMUNITY): Payer: Medicaid Other

## 2013-04-25 ENCOUNTER — Emergency Department (HOSPITAL_COMMUNITY): Payer: Medicaid Other

## 2013-04-25 ENCOUNTER — Encounter (HOSPITAL_COMMUNITY): Payer: Self-pay

## 2013-04-25 ENCOUNTER — Inpatient Hospital Stay (HOSPITAL_COMMUNITY)
Admission: EM | Admit: 2013-04-25 | Discharge: 2013-04-29 | DRG: 811 | Discharge status: Against Medical Advice | Payer: Medicaid Other | Attending: Internal Medicine | Admitting: Internal Medicine

## 2013-04-25 DIAGNOSIS — F17200 Nicotine dependence, unspecified, uncomplicated: Secondary | ICD-10-CM | POA: Diagnosis present

## 2013-04-25 DIAGNOSIS — E46 Unspecified protein-calorie malnutrition: Secondary | ICD-10-CM | POA: Diagnosis present

## 2013-04-25 DIAGNOSIS — K264 Chronic or unspecified duodenal ulcer with hemorrhage: Secondary | ICD-10-CM | POA: Diagnosis present

## 2013-04-25 DIAGNOSIS — K922 Gastrointestinal hemorrhage, unspecified: Secondary | ICD-10-CM

## 2013-04-25 DIAGNOSIS — K269 Duodenal ulcer, unspecified as acute or chronic, without hemorrhage or perforation: Secondary | ICD-10-CM

## 2013-04-25 DIAGNOSIS — K219 Gastro-esophageal reflux disease without esophagitis: Secondary | ICD-10-CM | POA: Diagnosis present

## 2013-04-25 DIAGNOSIS — R4182 Altered mental status, unspecified: Secondary | ICD-10-CM | POA: Diagnosis present

## 2013-04-25 DIAGNOSIS — D62 Acute posthemorrhagic anemia: Principal | ICD-10-CM | POA: Diagnosis present

## 2013-04-25 DIAGNOSIS — G9340 Encephalopathy, unspecified: Secondary | ICD-10-CM | POA: Diagnosis present

## 2013-04-25 DIAGNOSIS — F1010 Alcohol abuse, uncomplicated: Secondary | ICD-10-CM

## 2013-04-25 DIAGNOSIS — K921 Melena: Secondary | ICD-10-CM | POA: Diagnosis present

## 2013-04-25 DIAGNOSIS — D649 Anemia, unspecified: Secondary | ICD-10-CM

## 2013-04-25 DIAGNOSIS — F1210 Cannabis abuse, uncomplicated: Secondary | ICD-10-CM | POA: Diagnosis present

## 2013-04-25 DIAGNOSIS — F10239 Alcohol dependence with withdrawal, unspecified: Secondary | ICD-10-CM | POA: Diagnosis present

## 2013-04-25 DIAGNOSIS — F1020 Alcohol dependence, uncomplicated: Secondary | ICD-10-CM | POA: Diagnosis present

## 2013-04-25 DIAGNOSIS — D500 Iron deficiency anemia secondary to blood loss (chronic): Secondary | ICD-10-CM

## 2013-04-25 DIAGNOSIS — F10939 Alcohol use, unspecified with withdrawal, unspecified: Secondary | ICD-10-CM | POA: Diagnosis present

## 2013-04-25 LAB — COMPREHENSIVE METABOLIC PANEL
ALT: 11 U/L (ref 0–53)
BUN: 33 mg/dL — ABNORMAL HIGH (ref 6–23)
Calcium: 8.3 mg/dL — ABNORMAL LOW (ref 8.4–10.5)
Creatinine, Ser: 1.19 mg/dL (ref 0.50–1.35)
GFR calc Af Amer: 80 mL/min — ABNORMAL LOW (ref >90)
GFR calc non Af Amer: 69 mL/min — ABNORMAL LOW (ref >90)
Glucose, Bld: 119 mg/dL — ABNORMAL HIGH (ref 70–99)
Sodium: 142 mEq/L (ref 135–145)
Total Bilirubin: 0.1 mg/dL — ABNORMAL LOW (ref 0.3–1.2)
Total Protein: 5 g/dL — ABNORMAL LOW (ref 6.0–8.3)

## 2013-04-25 LAB — CBC WITH DIFFERENTIAL/PLATELET
Basophils Absolute: 0 10*3/uL (ref 0.0–0.1)
Eosinophils Absolute: 0 10*3/uL (ref 0.0–0.7)
HCT: 8.9 % — ABNORMAL LOW (ref 39.0–52.0)
Lymphs Abs: 2.5 10*3/uL (ref 0.7–4.0)
MCV: 76.1 fL — ABNORMAL LOW (ref 78.0–100.0)
Monocytes Relative: 7 % (ref 3–12)
Neutro Abs: 16.8 10*3/uL — ABNORMAL HIGH (ref 1.7–7.7)
Neutrophils Relative %: 81 % — ABNORMAL HIGH (ref 43–77)
Platelets: 233 10*3/uL (ref 150–400)
RBC: 1.17 MIL/uL — ABNORMAL LOW (ref 4.22–5.81)
RDW: 20.6 % — ABNORMAL HIGH (ref 11.5–15.5)
WBC: 20.7 10*3/uL — ABNORMAL HIGH (ref 4.0–10.5)

## 2013-04-25 LAB — URINALYSIS, ROUTINE W REFLEX MICROSCOPIC
Bilirubin Urine: NEGATIVE
Glucose, UA: NEGATIVE mg/dL
Hgb urine dipstick: NEGATIVE
Ketones, ur: NEGATIVE mg/dL
Leukocytes, UA: NEGATIVE
Protein, ur: NEGATIVE mg/dL
pH: 5 (ref 5.0–8.0)

## 2013-04-25 LAB — LIPASE, BLOOD: Lipase: 27 U/L (ref 11–59)

## 2013-04-25 LAB — RAPID URINE DRUG SCREEN, HOSP PERFORMED
Amphetamines: NOT DETECTED
Barbiturates: NOT DETECTED
Benzodiazepines: NOT DETECTED
Tetrahydrocannabinol: NOT DETECTED

## 2013-04-25 LAB — PROTIME-INR
INR: 1.11 (ref 0.00–1.49)
Prothrombin Time: 14.1 seconds (ref 11.6–15.2)

## 2013-04-25 LAB — APTT: aPTT: 24 seconds (ref 24–37)

## 2013-04-25 LAB — TROPONIN I: Troponin I: <0.3 ng/mL (ref <0.30)

## 2013-04-25 LAB — PREPARE RBC (CROSSMATCH)

## 2013-04-25 LAB — MRSA PCR SCREENING: MRSA by PCR: NEGATIVE

## 2013-04-25 MED ORDER — ONDANSETRON HCL 4 MG/2ML IJ SOLN
4.0000 mg | Freq: Four times a day (QID) | INTRAMUSCULAR | Status: DC | PRN
  Administered 2013-04-27: 4 mg via INTRAVENOUS
  Filled 2013-04-25: qty 2

## 2013-04-25 MED ORDER — SODIUM CHLORIDE 0.9 % IJ SOLN
3.0000 mL | Freq: Two times a day (BID) | INTRAMUSCULAR | Status: DC
  Administered 2013-04-25 – 2013-04-28 (×5): 3 mL via INTRAVENOUS

## 2013-04-25 MED ORDER — LORAZEPAM 2 MG/ML IJ SOLN
0.0000 mg | Freq: Two times a day (BID) | INTRAMUSCULAR | Status: DC
  Administered 2013-04-28: 06:00:00 4 mg via INTRAVENOUS
  Administered 2013-04-28 – 2013-04-29 (×2): 2 mg via INTRAVENOUS
  Filled 2013-04-25: qty 2
  Filled 2013-04-25 (×4): qty 1

## 2013-04-25 MED ORDER — FOLIC ACID 1 MG PO TABS
1.0000 mg | ORAL_TABLET | Freq: Every day | ORAL | Status: DC
Start: 2013-04-25 — End: 2013-04-29
  Administered 2013-04-26 – 2013-04-28 (×2): 1 mg via ORAL
  Filled 2013-04-25 (×4): qty 1

## 2013-04-25 MED ORDER — LORAZEPAM 1 MG PO TABS: 1.0000 mg | ORAL_TABLET | Freq: Four times a day (QID) | ORAL | Status: AC | PRN

## 2013-04-25 MED ORDER — ACETAMINOPHEN 650 MG RE SUPP: 650.0000 mg | Freq: Four times a day (QID) | RECTAL | Status: DC | PRN

## 2013-04-25 MED ORDER — SODIUM CHLORIDE 0.9 % IV BOLUS (SEPSIS)
1000.0000 mL | Freq: Once | INTRAVENOUS | Status: AC
  Administered 2013-04-25: 1000 mL via INTRAVENOUS

## 2013-04-25 MED ORDER — SODIUM CHLORIDE 0.9 % IV SOLN
INTRAVENOUS | Status: DC
  Administered 2013-04-25: 19:00:00 via INTRAVENOUS
  Administered 2013-04-25 – 2013-04-28 (×3): 75 mL/h via INTRAVENOUS
  Administered 2013-04-29: via INTRAVENOUS

## 2013-04-25 MED ORDER — LORAZEPAM 2 MG/ML IJ SOLN
1.0000 mg | Freq: Once | INTRAMUSCULAR | Status: AC
  Administered 2013-04-25: 1 mg via INTRAVENOUS
  Filled 2013-04-25: qty 1

## 2013-04-25 MED ORDER — LORAZEPAM 2 MG/ML IJ SOLN
0.0000 mg | Freq: Four times a day (QID) | INTRAMUSCULAR | Status: AC
  Administered 2013-04-26 – 2013-04-27 (×6): 2 mg via INTRAVENOUS
  Filled 2013-04-25 (×6): qty 1

## 2013-04-25 MED ORDER — VITAMIN B-1 100 MG PO TABS
100.0000 mg | ORAL_TABLET | Freq: Every day | ORAL | Status: DC
  Administered 2013-04-26 – 2013-04-28 (×2): 100 mg via ORAL
  Filled 2013-04-25 (×4): qty 1

## 2013-04-25 MED ORDER — THIAMINE HCL 100 MG/ML IJ SOLN
100.0000 mg | Freq: Every day | INTRAMUSCULAR | Status: DC
  Administered 2013-04-27: 100 mg via INTRAVENOUS
  Filled 2013-04-25 (×4): qty 1

## 2013-04-25 MED ORDER — LORAZEPAM 2 MG/ML IJ SOLN
1.0000 mg | Freq: Four times a day (QID) | INTRAMUSCULAR | Status: AC | PRN
  Administered 2013-04-27 – 2013-04-28 (×3): 1 mg via INTRAVENOUS
  Filled 2013-04-25 (×3): qty 1

## 2013-04-25 MED ORDER — PANTOPRAZOLE SODIUM 40 MG IV SOLR
80.0000 mg | Freq: Once | INTRAVENOUS | Status: AC
  Administered 2013-04-25: 80 mg via INTRAVENOUS
  Filled 2013-04-25: qty 80

## 2013-04-25 MED ORDER — ADULT MULTIVITAMIN W/MINERALS CH
1.0000 | ORAL_TABLET | Freq: Every day | ORAL | Status: DC
  Administered 2013-04-26 – 2013-04-28 (×2): 1 via ORAL
  Filled 2013-04-25 (×4): qty 1

## 2013-04-25 MED ORDER — ONDANSETRON HCL 4 MG PO TABS: 4.0000 mg | ORAL_TABLET | Freq: Four times a day (QID) | ORAL | Status: DC | PRN

## 2013-04-25 MED ORDER — ACETAMINOPHEN 325 MG PO TABS: 650.0000 mg | ORAL_TABLET | Freq: Four times a day (QID) | ORAL | Status: DC | PRN

## 2013-04-25 MED ORDER — PANTOPRAZOLE SODIUM 40 MG IV SOLR
40.0000 mg | Freq: Two times a day (BID) | INTRAVENOUS | Status: DC
  Administered 2013-04-26 – 2013-04-28 (×5): 40 mg via INTRAVENOUS
  Filled 2013-04-25 (×8): qty 40

## 2013-04-25 NOTE — ED Notes (Signed)
Per EMS, pt lives in a small house with 6 people. Sleeps on the floor, Other household member heard a thumb and found him on the floor. Pt appeared lethargic, responsive, answers all questions appropriate. Household member said he uses cocaine and ETOH (pt denies both). Pt smells like ETOH and Urine.

## 2013-04-25 NOTE — ED Notes (Signed)
Patient given a sandwich and drink as per verbal order from EDP. Patient having difficulty sitting up to eat. Patient repeatedly states he wants to go home.

## 2013-04-25 NOTE — ED Provider Notes (Signed)
CSN: [id number]     Arrival date & time 04/25/13  1106 History   First MD Initiated Contact with Patient 04/25/13 1214    Level V caveat due to uncooperativeness Chief Complaint  Patient presents with  . Abdominal Pain   (Consider location/radiation/quality/duration/timing/severity/associated sxs/prior Treatment) Patient is a 52 y.o. male presenting with abdominal pain. The history is provided by the patient.  Abdominal Pain  patient was refill less responsive at home. Reportedly lives in a house with 6 people in it. He did smell like urine. Patient states he does not like being woken up and does not want any treatment here. He denies pain. He asked what time it was. He states he does not want walk home. When told it was 12:30 he asked whether it was in the night during the day. He denies substance abuse.  Past Medical History  Diagnosis Date  . Abdominal  pain, other specified site   . GERD (gastroesophageal reflux disease)   . Abnormal CT scan, esophagus 03/2012  . Substance abuse 03/2012    tox screen positive for cocaine.    Past Surgical History  Procedure Laterality Date  . Undescended testicle      on right. noted on 03/2012 CT scan.  no surgery referral.   . Esophagogastroduodenoscopy  08/10/2012    Procedure: ESOPHAGOGASTRODUODENOSCOPY (EGD);  Surgeon: [name], MD;  Location: [facility] ENDOSCOPY;  Service: Endoscopy;  Laterality: N/A;  will be done in room 1   . Laparotomy  08/10/2012    Procedure: EXPLORATORY LAPAROTOMY;  Surgeon: [name], MD;  Location: [facility] OR;  Service: General;  Laterality: N/A;   History reviewed. No pertinent family history. History  Substance Use Topics  . Smoking status: Current Every Day Smoker -- 1.00 packs/day for 34 years  . Smokeless tobacco: Never Used  . Alcohol Use: Yes     Comment: 40oz beer/day    Review of Systems  Unable to perform ROS: Other  Gastrointestinal: Positive for abdominal pain.    Allergies  Chocolate;  Peanut-containing drug products; and Spinach  Home Medications  No current outpatient prescriptions on file. BP 126/48  Pulse 113  Temp(Src) 98.8 F (37.1 C)  Resp 21  SpO2 100% Physical Exam  Constitutional: He appears well-developed and well-nourished.  HENT:  Head: Normocephalic.  Poor dentition  Eyes: Pupils are equal, round, and reactive to light.  Cardiovascular: Normal rate and regular rhythm.   Pulmonary/Chest: Effort normal.  Abdominal: Soft. Bowel sounds are normal.  Musculoskeletal: Normal range of motion.  Neurological: He is alert.  Patient answers questions. He is able follow commands. He is somewhat uncooperative  Skin: Skin is warm.    ED Course  Procedures (including critical care time) Labs Review Labs Reviewed  CBC WITH DIFFERENTIAL - Abnormal; Notable for the following:    WBC 20.7 (*)    RBC 1.17 (*)    Hemoglobin 2.7 (*)    HCT 8.9 (*)    MCV 76.1 (*)    MCH 23.1 (*)    RDW 20.6 (*)    All other components within normal limits  COMPREHENSIVE METABOLIC PANEL - Abnormal; Notable for the following:    Glucose, Bld 119 (*)    BUN 33 (*)    Calcium 8.3 (*)    Total Protein 5.0 (*)    Albumin 2.5 (*)    Alkaline Phosphatase 34 (*)    Total Bilirubin 0.1 (*)    GFR calc non Af Amer 69 (*)  GFR calc Af Amer 80 (*)    All other components within normal limits  AMMONIA - Abnormal; Notable for the following:    Ammonia 10 (*)    All other components within normal limits  ETHANOL  TROPONIN I  URINALYSIS, ROUTINE W REFLEX MICROSCOPIC  URINE RAPID DRUG SCREEN (HOSP PERFORMED)  CBC WITH DIFFERENTIAL  PREPARE RBC (CROSSMATCH)   Imaging Review Ct Head Wo Contrast  04/25/2013   CLINICAL DATA:  Confusion, lethargy, altered mental status  EXAM: CT HEAD WITHOUT CONTRAST  TECHNIQUE: Contiguous axial images were obtained from the base of the skull through the vertex without intravenous contrast.  COMPARISON:  None.  FINDINGS: No skull fracture is noted.  Paranasal sinuses and mastoid air cells are unremarkable.  No intracranial hemorrhage, mass effect or midline shift.  No hydrocephalus. The gray and white-matter differentiation is preserved.  No acute infarction. No mass lesion is noted on this unenhanced scan. No intra or extra-axial fluid collection.  IMPRESSION: No acute intracranial abnormality.   Electronically Signed   By: Natasha Mead   On: 04/25/2013 15:12    MDM   1. Anemia due to GI blood loss    Patient with altered mental status potentially and uncooperativeness. He was found to be anemic. He would not allow rectal exam was not low with good abdominal examination. He's had previous GI bleeding and this is likely the source of the bleeding. His hemoglobin is gone down to 3 over the last 2 weeks from 11. Will be admitted to internal medicine.    Juliet Rude. Rubin Payor, MD 04/25/13 [phone]

## 2013-04-25 NOTE — ED Notes (Signed)
Patient called several times to use the restroom. I went into room, patient refused to use urinal or bedpan. Patient sat on floor/urinate when he attempted to get up earlier. Explained we were concerned about his safety, patient fussed at me. I informed the nurse, Silvio Pate.

## 2013-04-25 NOTE — Progress Notes (Signed)
UR completed 

## 2013-04-25 NOTE — ED Notes (Signed)
1140 Patient incontinent of urine upon arrival to the ED. Patient was beligerent when attempting to remove urine soaked clothes.

## 2013-04-25 NOTE — ED Notes (Signed)
Bed: WA06 Expected date:  Expected time:  Means of arrival:  Comments: 

## 2013-04-25 NOTE — Progress Notes (Deleted)
   CARE MANAGEMENT ED NOTE 04/25/2013  Patient:  Winokur,Noemi   Account Number:  [id number]  Date Initiated:  04/25/2013  Documentation initiated by:  Avari Nevares  Subjective/Objective Assessment:     Subjective/Objective Assessment Detail:   lives in a small house with 6 people. Sleeps on the floor, Other household member heard a thumb and found him on the floor. Pt appeared lethargic, responsive, answers all questions appropriate. Household member said he uses cocaine and ETOH (pt denies both). Pt smells like ETOH and Urine     Action/Plan:   Action/Plan Detail:   Anticipated DC Date:  04/25/2013     Status Recommendation to Physician:   Result of Recommendation:    Other ED Services  Consult Working Plan    DC Planning Services  Other  PCP issues  Outpatient Services - Pt will follow up    Choice offered to / List presented to:            Status of service:  Completed, signed off  ED Comments:   ED Comments Detail:  Pt listed with medicaid as payer source, no updated medicaid card scanned in  EPIC. CM spoke with pt about his pcp and he was noted to have the bed sheet over his head and in a fetal position.  Pt initially told CM he did not have a pcp.  When CM asked pt if he still was an active medicaid covered pt, he informed CM that his pcp was at [facility] and states he has "told them every time I come here" in an angry tone (never uncovering his head with the bed sheet)  Pt states he has a f/u appointment on 05/17/13 EPIC updated and appointment entered in d/c follow up section   

## 2013-04-25 NOTE — H&P (Addendum)
[facility] History and Physical  Tarell Schollmeyer ZOX:[id number] DOB: 04-Jun-1961 DOA: 04/25/2013   PCP: [name], MD  Specialists: None  Chief Complaint: Possible fall  HPI: [name] is a 52 y.o. male with a past medical history of bleeding duodenal ulcer, for which he underwent emergent surgery back in January of 2014, history of alcohol abuse who apparently was brought in because his other household members found him lying on the floor. He could have had a fall. He was responsive but confused. He was smelling of alcohol. EMS was called. In the emergency department patient has been quite belligerent. He has not been very cooperative with examination. He refuses to answer questions. He refuses rectal examination. He was found to be severely anemic and, hence, he is being admitted.  Home Medications: Prior to Admission medications   Not on File    Allergies:  Allergies  Allergen Reactions  . Chocolate Nausea And Vomiting  . Peanut-Containing Drug Products Nausea And Vomiting  . Spinach Nausea And Vomiting    Past Medical History: Past Medical History  Diagnosis Date  . Abdominal  pain, other specified site   . GERD (gastroesophageal reflux disease)   . Abnormal CT scan, esophagus 03/2012  . Substance abuse 03/2012    tox screen positive for cocaine.     Past Surgical History  Procedure Laterality Date  . Undescended testicle      on right. noted on 03/2012 CT scan.  no surgery referral.   . Esophagogastroduodenoscopy  08/10/2012    Procedure: ESOPHAGOGASTRODUODENOSCOPY (EGD);  Surgeon: [name], MD;  Location: [facility] ENDOSCOPY;  Service: Endoscopy;  Laterality: N/A;  will be done in room 1   . Laparotomy  08/10/2012    Procedure: EXPLORATORY LAPAROTOMY;  Surgeon: [name], MD;  Location: [facility] OR;  Service: General;  Laterality: N/A;    Social History:  reports that he has been smoking.  He has never used smokeless tobacco. He reports that  drinks alcohol. He  reports that he uses illicit drugs (Marijuana).  Living Situation: Unclear as to his living situation Activity Level: Unknown   Family History: Unable to obtain due to altered mental status  Review of Systems - unable to do due to altered mental status  Physical Examination  Filed Vitals:   04/25/13 1430 04/25/13 1500 04/25/13 1506 04/25/13 1530  BP: 127/50  138/48 126/48  Pulse:  114  113  Temp:      Resp: 18   21  SpO2:  100%  100%    General appearance: alert, combative, no distress and uncooperative Head: Normocephalic, without obvious abnormality, atraumatic Eyes: Pallor is noted. No icterus. Throat: Dry mucus membranes Resp: clear to auscultation bilaterally Cardio: regular rate and rhythm, S1, S2 normal, no murmur, click, rub or gallop GI: soft, minimally tender upper abdomen without rebound rigidity or guarding. Bowel sounds normal; no masses,  no organomegaly. Refuses rectal examination. Extremities: Minimal pedal edema is noted. Pulses: 2+ and symmetric Skin: Skin color, texture, turgor normal. No rashes or lesions Lymph nodes: Cervical, supraclavicular, and axillary nodes normal. Neurologic: He is alert. He is confused. No obvious cranial nerve deficits. He is moving all his extremities. Refuses to cooperate with examination.  Laboratory Data: Results for orders placed during the hospital encounter of 04/25/13 (from the past 48 hour(s))  CBC WITH DIFFERENTIAL     Status: Abnormal   Collection Time    04/25/13  2:58 PM      Result Value Range  WBC 20.7 (*) 4.0 - 10.5 K/uL   Comment: ADJUSTED FOR NUCLEATED RBC'S   RBC 1.17 (*) 4.22 - 5.81 MIL/uL   Hemoglobin 2.7 (*) 13.0 - 17.0 g/dL   Comment: RESULTS VERIFIED VIA RECOLLECT     RESULT REPEATED AND VERIFIED     CRITICAL RESULT CALLED TO, READ BACK BY AND VERIFIED WITH:     NEASE,E. AT 1532 ON 09.25.14 BY LOVE,T.   HCT 8.9 (*) 39.0 - 52.0 %   MCV 76.1 (*) 78.0 - 100.0 fL   MCH 23.1 (*) 26.0 - 34.0 pg   MCHC  30.3  30.0 - 36.0 g/dL   RDW 16.1 (*) 09.6 - 04.5 %   Platelets 233  150 - 400 K/uL   Neutrophils Relative % 81 (*) 43 - 77 %   Lymphocytes Relative 12  12 - 46 %   Monocytes Relative 7  3 - 12 %   Eosinophils Relative 0  0 - 5 %   Basophils Relative 0  0 - 1 %   Neutro Abs 16.8 (*) 1.7 - 7.7 K/uL   Lymphs Abs 2.5  0.7 - 4.0 K/uL   Monocytes Absolute 1.4 (*) 0.1 - 1.0 K/uL   Eosinophils Absolute 0.0  0.0 - 0.7 K/uL   Basophils Absolute 0.0  0.0 - 0.1 K/uL   RBC Morphology MARKED POLYCHROMASIA     Smear Review PLATELET COUNT CONFIRMED BY SMEAR    ETHANOL     Status: None   Collection Time    04/25/13  2:58 PM      Result Value Range   Alcohol, Ethyl (B) <11  0 - 11 mg/dL   Comment:            LOWEST DETECTABLE LIMIT FOR     SERUM ALCOHOL IS 11 mg/dL     FOR MEDICAL PURPOSES ONLY  COMPREHENSIVE METABOLIC PANEL     Status: Abnormal   Collection Time    04/25/13  2:58 PM      Result Value Range   Sodium 142  135 - 145 mEq/L   Potassium 4.2  3.5 - 5.1 mEq/L   Chloride 108  96 - 112 mEq/L   CO2 21  19 - 32 mEq/L   Glucose, Bld 119 (*) 70 - 99 mg/dL   BUN 33 (*) 6 - 23 mg/dL   Creatinine, Ser 4.09  0.50 - 1.35 mg/dL   Calcium 8.3 (*) 8.4 - 10.5 mg/dL   Total Protein 5.0 (*) 6.0 - 8.3 g/dL   Albumin 2.5 (*) 3.5 - 5.2 g/dL   AST 17  0 - 37 U/L   ALT 11  0 - 53 U/L   Alkaline Phosphatase 34 (*) 39 - 117 U/L   Total Bilirubin 0.1 (*) 0.3 - 1.2 mg/dL   GFR calc non Af Amer 69 (*) >90 mL/min   GFR calc Af Amer 80 (*) >90 mL/min   Comment: (NOTE)     The eGFR has been calculated using the CKD EPI equation.     This calculation has not been validated in all clinical situations.     eGFR's persistently <90 mL/min signify possible Chronic Kidney     Disease.  TROPONIN I     Status: None   Collection Time    04/25/13  2:58 PM      Result Value Range   Troponin I <0.30  <0.30 ng/mL   Comment:  Due to the release kinetics of cTnI,     a negative result within the first  hours     of the onset of symptoms does not rule out     myocardial infarction with certainty.     If myocardial infarction is still suspected,     repeat the test at appropriate intervals.  AMMONIA     Status: Abnormal   Collection Time    04/25/13  2:58 PM      Result Value Range   Ammonia 10 (*) 11 - 60 umol/L    Radiology Reports: Ct Head Wo Contrast  04/25/2013   CLINICAL DATA:  Confusion, lethargy, altered mental status  EXAM: CT HEAD WITHOUT CONTRAST  TECHNIQUE: Contiguous axial images were obtained from the base of the skull through the vertex without intravenous contrast.  COMPARISON:  None.  FINDINGS: No skull fracture is noted. Paranasal sinuses and mastoid air cells are unremarkable.  No intracranial hemorrhage, mass effect or midline shift.  No hydrocephalus. The gray and white-matter differentiation is preserved.  No acute infarction. No mass lesion is noted on this unenhanced scan. No intra or extra-axial fluid collection.  IMPRESSION: No acute intracranial abnormality.   Electronically Signed   By: Natasha Mead   On: 04/25/2013 15:12    Electrocardiogram: Sinus tach at 114 bpm. Normal axis. Subtle ST depression but old compared to previous EKG. LVH by voltage. Normal intervals.  Problem List  Principal Problem:   Anemia Active Problems:   Status post laparotomy with oversew of bleeding duodenal artery   Duodenal ulcer   Alcohol abuse   Acute encephalopathy   Assessment: This is a 52 year old, African American male, who presents after possibly sustaining a fall at home. He is confused and possibly going through alcohol withdrawal. He is also severely anemic. He's had multiple visits to the ED recently for abdominal pain. Anemia is most likely due to GI bleeding, though this is difficult to prove as patient is not cooperating with examination. He is hemodynamically stable at this time, and no other obvious bleeding is noted.  Plan: #1 severe microcytic anemia: Hemoglobin  was 11.6 on September 13. So this is definitely an acute loss. Apparently, the low hemoglobin has been verified twice. He has most likely experienced a GI bleed considering his history. He'll be transfused 4 units to begin with. He'll be monitored in step down unit.  #2 possible upper GI bleed: Dr. Elnoria Howard has been consulted. Since patient is somewhat agitated and since he is hemodynamically stable and doesn't appear to have active bleeding patient will likely undergo endoscopy only in the morning. If anything changes during the course of the night he may need to be endoscoped earlier. If he still refuses to cooperate and remains agitated he may require airway protection and mechanical ventilation. Back in January he had a bleeding duodenal ulcer requiring emergent surgery. His abdomen is soft. He only has mild tenderness in the upper abdomen, and without any rebound or rigidity. We will get a portable abdominal x-rays to begin with. I didn't see the need for any other imaging studies at this time unless the abdominal x-rays are abnormal. PPI will be given. He'll be kept n.p.o. Lipase. Check coags.  #3 acute encephalopathy: Most likely related to alcohol abuse and withdrawal. He'll be given Ativan and thiamine. Anti psychotic may need to be utilized if Ativan is not effective.  #4 history of drug abuse: He has used cocaine in the past. Urine drug screen  will be ordered.  DVT Prophylaxis: SCDs Code Status: Full code Family Communication: No family at bedside  Disposition Plan: Admit to step down   Further management decisions will depend on results of further testing and patient's response to treatment.  [facility]  [facility] Pager [phone]  If 7PM-7AM, please contact night-coverage [email or web address] Password [facility]  04/25/2013, 5:00 PM

## 2013-04-25 NOTE — Progress Notes (Signed)
   CARE MANAGEMENT ED NOTE 04/25/2013  Patient:  [name], [name]   Account Number:  [id number]  Date Initiated:  04/25/2013  Documentation initiated by:  Edd Arbour  Subjective/Objective Assessment:     Subjective/Objective Assessment Detail:   lives in a small house with 6 people. Sleeps on the floor, Other household member heard a thumb and found him on the floor. Pt appeared lethargic, responsive, answers all questions appropriate. Household member said he uses cocaine and ETOH (pt denies both). Pt smells like ETOH and Urine     Action/Plan:   Action/Plan Detail:   Anticipated DC Date:  04/25/2013     Status Recommendation to Physician:   Result of Recommendation:    Other ED Services  Consult Working Plan    DC Planning Services  Other  PCP issues  Outpatient Services - Pt will follow up    Choice offered to / List presented to:            Status of service:  Completed, signed off  ED Comments:   ED Comments Detail:  Pt listed with medicaid as payer source, no updated medicaid card scanned in  EPIC. CM spoke with pt about his pcp and he was noted to have the bed sheet over his head and in a fetal position.  Pt initially told CM he did not have a pcp.  When CM asked pt if he still was an active medicaid covered pt, he informed CM that his pcp was at [facility] and states he has "told them every time I come here" in an angry tone (never uncovering his head with the bed sheet)  Pt states he has a f/u appointment on 05/17/13 EPIC updated and appointment entered in d/c follow up section

## 2013-04-26 DIAGNOSIS — D500 Iron deficiency anemia secondary to blood loss (chronic): Secondary | ICD-10-CM

## 2013-04-26 DIAGNOSIS — K269 Duodenal ulcer, unspecified as acute or chronic, without hemorrhage or perforation: Secondary | ICD-10-CM

## 2013-04-26 LAB — HEMOGLOBIN AND HEMATOCRIT, BLOOD: Hemoglobin: 8.8 g/dL — ABNORMAL LOW (ref 13.0–17.0)

## 2013-04-26 LAB — CBC
Hemoglobin: 6.7 g/dL — CL (ref 13.0–17.0)
MCH: 28.4 pg (ref 26.0–34.0)
Platelets: 159 10*3/uL (ref 150–400)
RBC: 2.36 MIL/uL — ABNORMAL LOW (ref 4.22–5.81)
RDW: 16.5 % — ABNORMAL HIGH (ref 11.5–15.5)

## 2013-04-26 LAB — COMPREHENSIVE METABOLIC PANEL
AST: 27 U/L (ref 0–37)
Albumin: 2.1 g/dL — ABNORMAL LOW (ref 3.5–5.2)
CO2: 24 mEq/L (ref 19–32)
Calcium: 7.7 mg/dL — ABNORMAL LOW (ref 8.4–10.5)
Creatinine, Ser: 1.36 mg/dL — ABNORMAL HIGH (ref 0.50–1.35)
GFR calc non Af Amer: 58 mL/min — ABNORMAL LOW (ref >90)
Potassium: 3.9 mEq/L (ref 3.5–5.1)
Sodium: 145 mEq/L (ref 135–145)
Total Bilirubin: 0.4 mg/dL (ref 0.3–1.2)

## 2013-04-26 LAB — PREPARE RBC (CROSSMATCH)

## 2013-04-26 NOTE — Progress Notes (Signed)
INITIAL NUTRITION ASSESSMENT  DOCUMENTATION CODES Per approved criteria  -Not Applicable   INTERVENTION: - Diet advancement per MD - Will continue to monitor   NUTRITION DIAGNOSIS: Inadequate oral intake related to inability to eat as evidenced by NPO.   Goal: Advance diet as tolerated to regular diet  Monitor:  Weights, labs, diet advancement  Reason for Assessment: Nutrition risk   52 y.o. male  Admitting Dx: Anemia  ASSESSMENT: Pt admitted with possible fall. Has hx of bleeding duodenal ulcer and alcohol abuse. Blood alcohol level not elevated on admission. Pt found to have hemoglobin of 2.7 g/dL, getting blood transfusion currently. Attempted to meet with pt, however pt sound asleep during visit. Noted pt with some mild/moderate muscle wasting and subcutaneous fat loss in temporal region. Weight trend shows pt's weight down 14 pounds since January of this year.   Height: Ht Readings from Last 1 Encounters:  04/25/13 5\' 9"  (1.753 m)    Weight: Wt Readings from Last 1 Encounters:  04/26/13 159 lb 13.3 oz (72.5 kg)    Ideal Body Weight: 160 lb   % Ideal Body Weight: 99%  Wt Readings from Last 10 Encounters:  04/26/13 159 lb 13.3 oz (72.5 kg)  03/25/13 165 lb (74.844 kg)  12/23/12 163 lb (73.936 kg)  08/22/12 148 lb 9.6 oz (67.405 kg)  08/12/12 173 lb 4.5 oz (78.6 kg)  08/12/12 173 lb 4.5 oz (78.6 kg)  08/12/12 173 lb 4.5 oz (78.6 kg)  08/12/12 173 lb 4.5 oz (78.6 kg)  08/12/12 173 lb 4.5 oz (78.6 kg)    Usual Body Weight: 173 lb in January 2014  % Usual Body Weight: 92%  BMI:  Body mass index is 23.59 kg/(m^2).  Estimated Nutritional Needs: Kcal: 1800-2000 Protein: 75-90g Fluid: 1.8-2L/day  Skin: Intact   Diet Order: NPO  EDUCATION NEEDS: -No education needs identified at this time   Intake/Output Summary (Last 24 hours) at 04/26/13 1525 Last data filed at 04/26/13 1500  Gross per 24 hour  Intake 2146.75 ml  Output   1750 ml  Net 396.75  ml    Last BM: PTA  Labs:   Recent Labs Lab 04/25/13 1458 04/26/13 0835  NA 142 145  K 4.2 3.9  CL 108 114*  CO2 21 24  BUN 33* 25*  CREATININE 1.19 1.36*  CALCIUM 8.3* 7.7*  GLUCOSE 119* 95    CBG (last 3)  No results found for this basename: GLUCAP,  in the last 72 hours  Scheduled Meds: . folic acid  1 mg Oral Daily  . LORazepam  0-4 mg Intravenous Q6H   Followed by  . [START ON 04/27/2013] LORazepam  0-4 mg Intravenous Q12H  . multivitamin with minerals  1 tablet Oral Daily  . pantoprazole (PROTONIX) IV  40 mg Intravenous Q12H  . sodium chloride  3 mL Intravenous Q12H  . thiamine  100 mg Oral Daily   Or  . thiamine  100 mg Intravenous Daily    Continuous Infusions: . sodium chloride 75 mL/hr (04/25/13 1948)    Past Medical History  Diagnosis Date  . Abdominal  pain, other specified site   . GERD (gastroesophageal reflux disease)   . Abnormal CT scan, esophagus 03/2012  . Substance abuse 03/2012    tox screen positive for cocaine.     Past Surgical History  Procedure Laterality Date  . Undescended testicle      on right. noted on 03/2012 CT scan.  no surgery referral.   .  Esophagogastroduodenoscopy  08/10/2012    Procedure: ESOPHAGOGASTRODUODENOSCOPY (EGD);  Surgeon: [name], MD;  Location: [facility] ENDOSCOPY;  Service: Endoscopy;  Laterality: N/A;  will be done in room 1   . Laparotomy  08/10/2012    Procedure: EXPLORATORY LAPAROTOMY;  Surgeon: [name], MD;  Location: [facility] OR;  Service: General;  Laterality: N/A;    Levon Hedger MS, RD, LDN [phone] Pager [phone] After Hours Pager

## 2013-04-26 NOTE — Progress Notes (Signed)
[facility] PROGRESS NOTE  [name] ZOX:[id number] DOB: April 16, 1961 DOA: 04/25/2013 PCP: [name], MD  Assessment/Plan: Severe Microcytic Anemia/Likely Upper GI Bleed -Hb has gone from 2.7 on admission to 6.7 on 9/26 after 4 units of PRBCs. -Will transfuse another 2 units of PRBCs. -Has a h/o of a duodenal ulcer in 1/14 that required surgical correction. -With continued ETOH use, suspect recurrent PUD vs variceal bleed. -GI on board. -EGD deferred today given severe sedation. -Continue BID PPI.  ETOH Abuse with Withdrawals -Monitor on CIWA protocol. -Thiamine/folate  Code Status: Full COde Family Communication: Patient only  Disposition Plan: Keep in SDU today   Consultants:  GI, Dr. Elnoria Howard   Antibiotics:  None   Subjective: Sedated. Able to tell me his name and where he is.  Objective: Filed Vitals:   04/26/13 0600 04/26/13 0630 04/26/13 0700 04/26/13 0800  BP: 113/59 110/59 109/51 107/46  Pulse: 97 96 94 96  Temp:  98.2 F (36.8 C)  99.1 F (37.3 C)  TempSrc:  Axillary  Oral  Resp:    19  Height:      Weight:      SpO2: 100% 100% 99% 98%    Intake/Output Summary (Last 24 hours) at 04/26/13 0947 Last data filed at 04/26/13 0700  Gross per 24 hour  Intake 1346.75 ml  Output   1350 ml  Net  -3.25 ml   Filed Weights   04/25/13 1825 04/26/13 0445  Weight: 72.5 kg (159 lb 13.3 oz) 72.5 kg (159 lb 13.3 oz)    Exam:   General:  Lethargic, can arouse briefly to answer simple questions.  Cardiovascular: RRR, no M/R/G  Respiratory: CTA B  Abdomen: S/NT/ND/+BS  Extremities: no C/C/E   Neurologic:  Non-focal  Data Reviewed: Basic Metabolic Panel:  Recent Labs Lab 04/25/13 1458 04/26/13 0835  NA 142 145  K 4.2 3.9  CL 108 114*  CO2 21 24  GLUCOSE 119* 95  BUN 33* 25*  CREATININE 1.19 1.36*  CALCIUM 8.3* 7.7*   Liver Function Tests:  Recent Labs Lab 04/25/13 1458 04/26/13 0835  AST 17 27  ALT 11 11  ALKPHOS 34* 31*   BILITOT 0.1* 0.4  PROT 5.0* 4.4*  ALBUMIN 2.5* 2.1*    Recent Labs Lab 04/25/13 1410  LIPASE 27    Recent Labs Lab 04/25/13 1458  AMMONIA 10*   CBC:  Recent Labs Lab 04/25/13 1458 04/26/13 0835  WBC 20.7* 13.1*  NEUTROABS 16.8*  --   HGB 2.7* 6.7*  HCT 8.9* 19.3*  MCV 76.1* 81.8  PLT 233 159   Cardiac Enzymes:  Recent Labs Lab 04/25/13 1458  TROPONINI <0.30   BNP (last 3 results) No results found for this basename: PROBNP,  in the last 8760 hours CBG: No results found for this basename: GLUCAP,  in the last 168 hours  Recent Results (from the past 240 hour(s))  MRSA PCR SCREENING     Status: None   Collection Time    04/25/13  6:28 PM      Result Value Range Status   MRSA by PCR NEGATIVE  NEGATIVE Final   Comment:            The GeneXpert MRSA Assay (FDA     approved for NASAL specimens     only), is one component of a     comprehensive MRSA colonization     surveillance program. It is not     intended to diagnose MRSA  infection nor to guide or     monitor treatment for     MRSA infections.     Studies: Ct Head Wo Contrast  04/25/2013   CLINICAL DATA:  Confusion, lethargy, altered mental status  EXAM: CT HEAD WITHOUT CONTRAST  TECHNIQUE: Contiguous axial images were obtained from the base of the skull through the vertex without intravenous contrast.  COMPARISON:  None.  FINDINGS: No skull fracture is noted. Paranasal sinuses and mastoid air cells are unremarkable.  No intracranial hemorrhage, mass effect or midline shift.  No hydrocephalus. The gray and white-matter differentiation is preserved.  No acute infarction. No mass lesion is noted on this unenhanced scan. No intra or extra-axial fluid collection.  IMPRESSION: No acute intracranial abnormality.   Electronically Signed   By: Natasha Mead   On: 04/25/2013 15:12   Dg Abd Portable 1v  04/25/2013   CLINICAL DATA:  Abdominal pain.  EXAM: PORTABLE ABDOMEN - 1 VIEW  COMPARISON:  03/23/2013.   FINDINGS: Motion blurring. Grossly normal bowel gas pattern. Moderate amount of stool in the rectum. Air in the penile urethra. Normal appearing bones.  IMPRESSION: 1. Moderate stool in the rectum. 2. Air in the penile urethra, of unknown significance. 3. Otherwise, unremarkable examination.   Electronically Signed   By: Gordan Payment   On: 04/25/2013 19:38    Scheduled Meds: . folic acid  1 mg Oral Daily  . LORazepam  0-4 mg Intravenous Q6H   Followed by  . [START ON 04/27/2013] LORazepam  0-4 mg Intravenous Q12H  . multivitamin with minerals  1 tablet Oral Daily  . pantoprazole (PROTONIX) IV  40 mg Intravenous Q12H  . sodium chloride  3 mL Intravenous Q12H  . thiamine  100 mg Oral Daily   Or  . thiamine  100 mg Intravenous Daily   Continuous Infusions: . sodium chloride 75 mL/hr (04/25/13 1948)    Principal Problem:   Anemia Active Problems:   Status post laparotomy with oversew of bleeding duodenal artery   Duodenal ulcer   Alcohol abuse   Acute encephalopathy    Time spent: 35 minutes    HERNANDEZ ACOSTA,ESTELA  [facility] Pager [phone]  If 7PM-7AM, please contact night-coverage at [email or web address], password [facility] 04/26/2013, 9:47 AM  LOS: 1 day

## 2013-04-26 NOTE — Progress Notes (Signed)
CRITICAL VALUE ALERT  Critical value received:  6.7  Date of notification:  04/26/13 Time of notification:  0853  Critical value read back: yes  Nurse who received alert: Aldean Jewett RN  MD notified (1st page):  Ardyth Harps  Time of first page:  [phone]  MD notified (2nd page): N/A  Time of second page: N/A  Responding MD:  Ardyth Harps  Time MD responded:  [phone]

## 2013-04-26 NOTE — Consult Note (Signed)
Reason for Consult: Severe anemia and melena. Referring Physician: [facility]  Einar Grad HPI: This is a 52 year old male with a PMH of polypsubstance abuse, ETOH abuse, and posterior duodenal bulb ulcer 08/2012 s/p oversew for severe bleeding who is admitted for severe anemia and syncope.  The patient was found on the floor, smelling of ETOH, and confused.  In the ER he was combative and uncooperative, but he was hemodynamically stable.  In January this year the patient presented with an upper GI bleed.  Dr. Christella Hartigan attempted to arrest the bleeding in the duodenal bulb, but the bleeding was not able to be controlled.  He underwent a laparotomy emergently and ultimate recovered.  Past Medical History  Diagnosis Date  . Abdominal  pain, other specified site   . GERD (gastroesophageal reflux disease)   . Abnormal CT scan, esophagus 03/2012  . Substance abuse 03/2012    tox screen positive for cocaine.     Past Surgical History  Procedure Laterality Date  . Undescended testicle      on right. noted on 03/2012 CT scan.  no surgery referral.   . Esophagogastroduodenoscopy  08/10/2012    Procedure: ESOPHAGOGASTRODUODENOSCOPY (EGD);  Surgeon: [name], MD;  Location: [facility];  Service: Endoscopy;  Laterality: N/A;  will be done in room 1   . Laparotomy  08/10/2012    Procedure: EXPLORATORY LAPAROTOMY;  Surgeon: [name], MD;  Location[facility] OR;  Service: General;  Laterality: N/A;    History reviewed. No pertinent family history.  Social History:  reports that he has been smoking.  He has never used smokeless tobacco. He reports that  drinks alcohol. He reports that he uses illicit drugs (Marijuana).  Allergies:  Allergies  Allergen Reactions  . Chocolate Nausea And Vomiting  . Peanut-Containing Drug Products Nausea And Vomiting  . Spinach Nausea And Vomiting    Medications:  Scheduled: . folic acid  1 mg Oral Daily  . LORazepam  0-4 mg Intravenous Q6H   Followed by   . [START ON 04/27/2013] LORazepam  0-4 mg Intravenous Q12H  . multivitamin with minerals  1 tablet Oral Daily  . pantoprazole (PROTONIX) IV  40 mg Intravenous Q12H  . sodium chloride  3 mL Intravenous Q12H  . thiamine  100 mg Oral Daily   Or  . thiamine  100 mg Intravenous Daily   Continuous: . sodium chloride 75 mL/hr (04/25/13 1948)    Results for orders placed during the hospital encounter of 04/25/13 (from the past 24 hour(s))  LIPASE, BLOOD     Status: None   Collection Time    04/25/13  2:10 PM      Result Value Range   Lipase 27  11 - 59 U/L  CBC WITH DIFFERENTIAL     Status: Abnormal   Collection Time    04/25/13  2:58 PM      Result Value Range   WBC 20.7 (*) 4.0 - 10.5 K/uL   RBC 1.17 (*) 4.22 - 5.81 MIL/uL   Hemoglobin 2.7 (*) 13.0 - 17.0 g/dL   HCT 8.9 (*) 04.5 - 40.9 %   MCV 76.1 (*) 78.0 - 100.0 fL   MCH 23.1 (*) 26.0 - 34.0 pg   MCHC 30.3  30.0 - 36.0 g/dL   RDW 81.1 (*) 91.4 - 78.2 %   Platelets 233  150 - 400 K/uL   Neutrophils Relative % 81 (*) 43 - 77 %   Lymphocytes Relative 12  12 - 46 %   Monocytes Relative 7  3 - 12 %   Eosinophils Relative 0  0 - 5 %   Basophils Relative 0  0 - 1 %   Neutro Abs 16.8 (*) 1.7 - 7.7 K/uL   Lymphs Abs 2.5  0.7 - 4.0 K/uL   Monocytes Absolute 1.4 (*) 0.1 - 1.0 K/uL   Eosinophils Absolute 0.0  0.0 - 0.7 K/uL   Basophils Absolute 0.0  0.0 - 0.1 K/uL   RBC Morphology MARKED POLYCHROMASIA     Smear Review PLATELET COUNT CONFIRMED BY SMEAR    ETHANOL     Status: None   Collection Time    04/25/13  2:58 PM      Result Value Range   Alcohol, Ethyl (B) <11  0 - 11 mg/dL  COMPREHENSIVE METABOLIC PANEL     Status: Abnormal   Collection Time    04/25/13  2:58 PM      Result Value Range   Sodium 142  135 - 145 mEq/L   Potassium 4.2  3.5 - 5.1 mEq/L   Chloride 108  96 - 112 mEq/L   CO2 21  19 - 32 mEq/L   Glucose, Bld 119 (*) 70 - 99 mg/dL   BUN 33 (*) 6 - 23 mg/dL   Creatinine, Ser 1.61  0.50 - 1.35 mg/dL   Calcium  8.3 (*) 8.4 - 10.5 mg/dL   Total Protein 5.0 (*) 6.0 - 8.3 g/dL   Albumin 2.5 (*) 3.5 - 5.2 g/dL   AST 17  0 - 37 U/L   ALT 11  0 - 53 U/L   Alkaline Phosphatase 34 (*) 39 - 117 U/L   Total Bilirubin 0.1 (*) 0.3 - 1.2 mg/dL   GFR calc non Af Amer 69 (*) >90 mL/min   GFR calc Af Amer 80 (*) >90 mL/min  TROPONIN I     Status: None   Collection Time    04/25/13  2:58 PM      Result Value Range   Troponin I <0.30  <0.30 ng/mL  AMMONIA     Status: Abnormal   Collection Time    04/25/13  2:58 PM      Result Value Range   Ammonia 10 (*) 11 - 60 umol/L  URINALYSIS, ROUTINE W REFLEX MICROSCOPIC     Status: Abnormal   Collection Time    04/25/13  4:42 PM      Result Value Range   Color, Urine YELLOW  YELLOW   APPearance CLOUDY (*) CLEAR   Specific Gravity, Urine 1.023  1.005 - 1.030   pH 5.0  5.0 - 8.0   Glucose, UA NEGATIVE  NEGATIVE mg/dL   Hgb urine dipstick NEGATIVE  NEGATIVE   Bilirubin Urine NEGATIVE  NEGATIVE   Ketones, ur NEGATIVE  NEGATIVE mg/dL   Protein, ur NEGATIVE  NEGATIVE mg/dL   Urobilinogen, UA 0.2  0.0 - 1.0 mg/dL   Nitrite NEGATIVE  NEGATIVE   Leukocytes, UA NEGATIVE  NEGATIVE  URINE RAPID DRUG SCREEN (HOSP PERFORMED)     Status: None   Collection Time    04/25/13  4:42 PM      Result Value Range   Opiates NONE DETECTED  NONE DETECTED   Cocaine NONE DETECTED  NONE DETECTED   Benzodiazepines NONE DETECTED  NONE DETECTED   Amphetamines NONE DETECTED  NONE DETECTED   Tetrahydrocannabinol NONE DETECTED  NONE DETECTED   Barbiturates NONE DETECTED  NONE DETECTED  MRSA PCR SCREENING     Status: None   Collection Time    04/25/13  6:28 PM      Result Value Range   MRSA by PCR NEGATIVE  NEGATIVE  PREPARE RBC (CROSSMATCH)     Status: None   Collection Time    04/25/13  6:30 PM      Result Value Range   Order Confirmation ORDER PROCESSED BY BLOOD BANK    TYPE AND SCREEN     Status: None   Collection Time    04/25/13  7:30 PM      Result Value Range   ABO/RH(D)  O NEG     Antibody Screen POS     Sample Expiration 04/28/2013     Antibody Identification ANTI-D     DAT, IgG NEG     Unit Number [id number]     Blood Component Type RED CELLS,LR     Unit division 00     Status of Unit ISSUED     Transfusion Status OK TO TRANSFUSE     Crossmatch Result COMPATIBLE     Unit Number [id number]     Blood Component Type RED CELLS,LR     Unit division 00     Status of Unit ISSUED     Transfusion Status OK TO TRANSFUSE     Crossmatch Result COMPATIBLE     Unit Number [id number]     Blood Component Type RED CELLS,LR     Unit division 00     Status of Unit ISSUED     Transfusion Status OK TO TRANSFUSE     Crossmatch Result COMPATIBLE     Unit Number [id number]     Blood Component Type RED CELLS,LR     Unit division 00     Status of Unit ISSUED     Transfusion Status OK TO TRANSFUSE     Crossmatch Result COMPATIBLE    PROTIME-INR     Status: None   Collection Time    04/25/13  7:30 PM      Result Value Range   Prothrombin Time 14.1  11.6 - 15.2 seconds   INR 1.11  0.00 - 1.49  APTT     Status: None   Collection Time    04/25/13  7:30 PM      Result Value Range   aPTT 24  24 - 37 seconds  ABO/RH     Status: None   Collection Time    04/25/13  7:30 PM      Result Value Range   ABO/RH(D) O NEG       Ct Head Wo Contrast  04/25/2013   CLINICAL DATA:  Confusion, lethargy, altered mental status  EXAM: CT HEAD WITHOUT CONTRAST  TECHNIQUE: Contiguous axial images were obtained from the base of the skull through the vertex without intravenous contrast.  COMPARISON:  None.  FINDINGS: No skull fracture is noted. Paranasal sinuses and mastoid air cells are unremarkable.  No intracranial hemorrhage, mass effect or midline shift.  No hydrocephalus. The gray and white-matter differentiation is preserved.  No acute infarction. No mass lesion is noted on this unenhanced scan. No intra or extra-axial fluid collection.  IMPRESSION: No acute intracranial  abnormality.   Electronically Signed   By: Natasha Mead   On: 04/25/2013 15:12   Dg Abd Portable 1v  04/25/2013   CLINICAL DATA:  Abdominal pain.  EXAM: PORTABLE ABDOMEN - 1 VIEW  COMPARISON:  03/23/2013.  FINDINGS: Motion blurring. Grossly normal  bowel gas pattern. Moderate amount of stool in the rectum. Air in the penile urethra. Normal appearing bones.  IMPRESSION: 1. Moderate stool in the rectum. 2. Air in the penile urethra, of unknown significance. 3. Otherwise, unremarkable examination.   Electronically Signed   By: Gordan Payment   On: 04/25/2013 19:38    ROS:  As stated above in the HPI otherwise negative.  Blood pressure 107/46, pulse 96, temperature 98.2 F (36.8 C), temperature source Axillary, resp. rate 19, height 5\' 9"  (1.753 m), weight 159 lb 13.3 oz (72.5 kg), SpO2 98.00%.    PE: Gen: NAD, Alert and Oriented HEENT:  [place]/AT, EOMI Neck: Supple, no LAD Lungs: CTA Bilaterally CV: RRR without M/G/R ABM: Soft, NTND, +BS Ext: No C/C/E  Assessment/Plan: 1) Severe anemia. 2) History of duodenal ulcer s/p oversew. 3) ETOH abuse. 4) ETOH withdrawal.   At this time the patient is sedated.  He remains hemodynamically stable and his HGB has increased with 4 units of PRBC.  Nursing reports that he does not have any family members and he cannot consent for the procedure with his ETOH withdrawal, albeit mild at this time.  Plan: 1) EGD once the patient can consent for the procedure. 2) EGD can be performed emergently if he has worsening bleeding/decompensation. 3) Follow HGB. 4) PPI. 5) Treat ETOH withdrawal.  Kaceton Vieau D 04/26/2013, 8:49 AM

## 2013-04-27 LAB — CBC
Hemoglobin: 8.7 g/dL — ABNORMAL LOW (ref 13.0–17.0)
MCH: 27.4 pg (ref 26.0–34.0)
MCHC: 33.1 g/dL (ref 30.0–36.0)
MCV: 83 fL (ref 78.0–100.0)
Platelets: 161 10*3/uL (ref 150–400)
RBC: 3.17 MIL/uL — ABNORMAL LOW (ref 4.22–5.81)
RDW: 15.9 % — ABNORMAL HIGH (ref 11.5–15.5)
WBC: 13.7 10*3/uL — ABNORMAL HIGH (ref 4.0–10.5)

## 2013-04-27 LAB — BASIC METABOLIC PANEL
Calcium: 7.8 mg/dL — ABNORMAL LOW (ref 8.4–10.5)
Chloride: 111 mEq/L (ref 96–112)
Creatinine, Ser: 1.3 mg/dL (ref 0.50–1.35)
GFR calc non Af Amer: 62 mL/min — ABNORMAL LOW (ref >90)
Glucose, Bld: 87 mg/dL (ref 70–99)
Sodium: 141 mEq/L (ref 135–145)

## 2013-04-27 MED ORDER — LORAZEPAM 2 MG/ML IJ SOLN
2.0000 mg | Freq: Once | INTRAMUSCULAR | Status: AC
  Administered 2013-04-27: 2 mg via INTRAVENOUS

## 2013-04-27 MED ORDER — HALOPERIDOL LACTATE 5 MG/ML IJ SOLN
5.0000 mg | Freq: Once | INTRAMUSCULAR | Status: AC
  Administered 2013-04-27: 5 mg via INTRAVENOUS

## 2013-04-27 MED ORDER — CHLORHEXIDINE GLUCONATE 0.12 % MT SOLN
15.0000 mL | Freq: Two times a day (BID) | OROMUCOSAL | Status: DC
  Administered 2013-04-27 – 2013-04-29 (×4): 15 mL via OROMUCOSAL
  Filled 2013-04-27 (×7): qty 15

## 2013-04-27 MED ORDER — FUROSEMIDE 10 MG/ML IJ SOLN
40.0000 mg | Freq: Once | INTRAMUSCULAR | Status: AC
  Administered 2013-04-27: 40 mg via INTRAVENOUS
  Filled 2013-04-27: qty 4

## 2013-04-27 MED ORDER — BIOTENE DRY MOUTH MT LIQD
15.0000 mL | Freq: Two times a day (BID) | OROMUCOSAL | Status: DC
  Administered 2013-04-27 (×2): 15 mL via OROMUCOSAL

## 2013-04-27 MED ORDER — HALOPERIDOL LACTATE 5 MG/ML IJ SOLN
INTRAMUSCULAR | Status: AC
  Filled 2013-04-27: qty 1

## 2013-04-27 NOTE — Progress Notes (Signed)
Unassigned patient Subjective: Patient is fast asleep. As per his nurse he has been very combative yesterday. There have been no further signs of GI bleeding.  Objective: Vital signs in last 24 hours: Temp:  [97.4 F (36.3 C)-98.8 F (37.1 C)] 98.8 F (37.1 C) (09/27 1400) Pulse Rate:  [74-106] 97 (09/27 1400) Resp:  [17-53] 18 (09/27 1400) BP: (99-133)/(49-76) 99/49 mmHg (09/27 1400) SpO2:  [95 %-100 %] 100 % (09/27 1400) Weight:  [74.2 kg (163 lb 9.3 oz)] 74.2 kg (163 lb 9.3 oz) (09/27 0400)    Intake/Output from previous day: 09/26 0701 - 09/27 0700 In: 2167 [I.V.:1530; Blood:637] Out: 1210 [Urine:1210] Intake/Output this shift: Total I/O In: 450 [I.V.:450] Out: 2650 [Urine:2650]  GPE not done as I did not want to wake him up. Lab Results:  Recent Labs  04/25/13 1458 04/26/13 0835 04/26/13 2015 04/27/13 0405  WBC 20.7* 13.1*  --  13.7*  HGB 2.7* 6.7* 8.8* 8.7*  HCT 8.9* 19.3* 25.4* 26.3*  PLT 233 159  --  161   BMET  Recent Labs  04/25/13 1458 04/26/13 0835 04/27/13 0405  NA 142 145 141  K 4.2 3.9 4.0  CL 108 114* 111  CO2 21 24 22   GLUCOSE 119* 95 87  BUN 33* 25* 22  CREATININE 1.19 1.36* 1.30  CALCIUM 8.3* 7.7* 7.8*   LFT  Recent Labs  04/26/13 0835  PROT 4.4*  ALBUMIN 2.1*  AST 27  ALT 11  ALKPHOS 31*  BILITOT 0.4   PT/INR  Recent Labs  04/25/13 1930  LABPROT 14.1  INR 1.11   Studies/Results: Dg Abd Portable 1v  04/25/2013   CLINICAL DATA:  Abdominal pain.  EXAM: PORTABLE ABDOMEN - 1 VIEW  COMPARISON:  03/23/2013.  FINDINGS: Motion blurring. Grossly normal bowel gas pattern. Moderate amount of stool in the rectum. Air in the penile urethra. Normal appearing bones.  IMPRESSION: 1. Moderate stool in the rectum. 2. Air in the penile urethra, of unknown significance. 3. Otherwise, unremarkable examination.   Electronically Signed   By: Gordan Payment   On: 04/25/2013 19:38   Medications: I have reviewed the patient's current  medications.  Assessment/Plan: 1) Anemia with alcohol abuse/encephalopathy and a history of duodenal ulcers. His hemoglobin is stable currently. Will scope early next week-Monday or Tuesday or even tomorrow if he is able to given consent for a procedure.  2) Malnutrition.  LOS: 2 days   Tavonna Worthington 04/27/2013, 3:37 PM

## 2013-04-27 NOTE — Progress Notes (Signed)
Patient has another heart sound in addition to S1, S2.  He is asymptomatic.  Will notify Dr. Ardyth Harps at rounding.

## 2013-04-27 NOTE — Progress Notes (Addendum)
[facility] PROGRESS NOTE  [name] ZOX:[id number] DOB: 1961/05/20 DOA: 04/25/2013 PCP: [name], MD  Assessment/Plan: Severe Microcytic Anemia/Likely Upper GI Bleed -Hb has gone from 2.7 on admission to 8.7 on 9/27 after a total of 6 units of PRBCs. -No signs of active bleeding while in the hospital. -Has a h/o of a duodenal ulcer in 1/14 that required surgical correction. -With continued ETOH use, suspect recurrent PUD vs variceal bleed. -GI on board. -?timing of EGD. -Continue BID PPI.  ETOH Abuse with Withdrawals -Monitor on CIWA protocol. -Thiamine/folate  Code Status: Full Code Family Communication: Patient only  Disposition Plan: Move to telemetry today.   Consultants:  GI, Dr. Elnoria Howard   Antibiotics:  None   Subjective: Extremely sedated this am. Received multiple doses of ativan overnight 2/2 withdrawal phenomena.  Objective: Filed Vitals:   04/27/13 0900 04/27/13 1000 04/27/13 1200 04/27/13 1216  BP:  108/53 105/53   Pulse:    106  Temp:      TempSrc:      Resp: 26 25 23    Height:      Weight:      SpO2: 100%  100%     Intake/Output Summary (Last 24 hours) at 04/27/13 1224 Last data filed at 04/27/13 1200  Gross per 24 hour  Intake 2029.5 ml  Output   1735 ml  Net  294.5 ml   Filed Weights   04/25/13 1825 04/26/13 0445 04/27/13 0400  Weight: 72.5 kg (159 lb 13.3 oz) 72.5 kg (159 lb 13.3 oz) 74.2 kg (163 lb 9.3 oz)    Exam:   General:  Drowsy, sedated.  Cardiovascular: RRR, no M/R/G  Respiratory: Bilateral crackles.  Abdomen: S/NT/ND/+BS  Extremities: no C/C/E   Neurologic:  Non-focal  Data Reviewed: Basic Metabolic Panel:  Recent Labs Lab 04/25/13 1458 04/26/13 0835 04/27/13 0405  NA 142 145 141  K 4.2 3.9 4.0  CL 108 114* 111  CO2 21 24 22   GLUCOSE 119* 95 87  BUN 33* 25* 22  CREATININE 1.19 1.36* 1.30  CALCIUM 8.3* 7.7* 7.8*   Liver Function Tests:  Recent Labs Lab 04/25/13 1458 04/26/13 0835   AST 17 27  ALT 11 11  ALKPHOS 34* 31*  BILITOT 0.1* 0.4  PROT 5.0* 4.4*  ALBUMIN 2.5* 2.1*    Recent Labs Lab 04/25/13 1410  LIPASE 27    Recent Labs Lab 04/25/13 1458  AMMONIA 10*   CBC:  Recent Labs Lab 04/25/13 1458 04/26/13 0835 04/26/13 2015 04/27/13 0405  WBC 20.7* 13.1*  --  13.7*  NEUTROABS 16.8*  --   --   --   HGB 2.7* 6.7* 8.8* 8.7*  HCT 8.9* 19.3* 25.4* 26.3*  MCV 76.1* 81.8  --  83.0  PLT 233 159  --  161   Cardiac Enzymes:  Recent Labs Lab 04/25/13 1458  TROPONINI <0.30   BNP (last 3 results) No results found for this basename: PROBNP,  in the last 8760 hours CBG: No results found for this basename: GLUCAP,  in the last 168 hours  Recent Results (from the past 240 hour(s))  MRSA PCR SCREENING     Status: None   Collection Time    04/25/13  6:28 PM      Result Value Range Status   MRSA by PCR NEGATIVE  NEGATIVE Final   Comment:            The GeneXpert MRSA Assay (FDA     approved for NASAL specimens  only), is one component of a     comprehensive MRSA colonization     surveillance program. It is not     intended to diagnose MRSA     infection nor to guide or     monitor treatment for     MRSA infections.     Studies: Ct Head Wo Contrast  04/25/2013   CLINICAL DATA:  Confusion, lethargy, altered mental status  EXAM: CT HEAD WITHOUT CONTRAST  TECHNIQUE: Contiguous axial images were obtained from the base of the skull through the vertex without intravenous contrast.  COMPARISON:  None.  FINDINGS: No skull fracture is noted. Paranasal sinuses and mastoid air cells are unremarkable.  No intracranial hemorrhage, mass effect or midline shift.  No hydrocephalus. The gray and white-matter differentiation is preserved.  No acute infarction. No mass lesion is noted on this unenhanced scan. No intra or extra-axial fluid collection.  IMPRESSION: No acute intracranial abnormality.   Electronically Signed   By: Natasha Mead   On: 04/25/2013 15:12    Dg Abd Portable 1v  04/25/2013   CLINICAL DATA:  Abdominal pain.  EXAM: PORTABLE ABDOMEN - 1 VIEW  COMPARISON:  03/23/2013.  FINDINGS: Motion blurring. Grossly normal bowel gas pattern. Moderate amount of stool in the rectum. Air in the penile urethra. Normal appearing bones.  IMPRESSION: 1. Moderate stool in the rectum. 2. Air in the penile urethra, of unknown significance. 3. Otherwise, unremarkable examination.   Electronically Signed   By: Gordan Payment   On: 04/25/2013 19:38    Scheduled Meds: . antiseptic oral rinse  15 mL Mouth Rinse q12n4p  . chlorhexidine  15 mL Mouth Rinse BID  . folic acid  1 mg Oral Daily  . LORazepam  0-4 mg Intravenous Q6H   Followed by  . LORazepam  0-4 mg Intravenous Q12H  . multivitamin with minerals  1 tablet Oral Daily  . pantoprazole (PROTONIX) IV  40 mg Intravenous Q12H  . sodium chloride  3 mL Intravenous Q12H  . thiamine  100 mg Oral Daily   Or  . thiamine  100 mg Intravenous Daily   Continuous Infusions: . sodium chloride 75 mL/hr (04/27/13 1138)    Principal Problem:   Anemia Active Problems:   Status post laparotomy with oversew of bleeding duodenal artery   Duodenal ulcer   Alcohol abuse   Acute encephalopathy    Time spent: 35 minutes    HERNANDEZ ACOSTA,ESTELA  [facility] Pager [phone]  If 7PM-7AM, please contact night-coverage at [email or web address], password [facility] 04/27/2013, 12:24 PM  LOS: 2 days

## 2013-04-27 NOTE — Progress Notes (Signed)
Report called to Darl Pikes, RN.  Patient transferred via bed to room 1416 at 1345.

## 2013-04-28 LAB — TYPE AND SCREEN
ABO/RH(D): O NEG
Antibody Screen: POSITIVE
Unit division: 0
Unit division: 0
Unit division: 0

## 2013-04-28 LAB — BASIC METABOLIC PANEL
BUN: 28 mg/dL — ABNORMAL HIGH (ref 6–23)
Calcium: 7.9 mg/dL — ABNORMAL LOW (ref 8.4–10.5)
Chloride: 113 mEq/L — ABNORMAL HIGH (ref 96–112)
Creatinine, Ser: 1.23 mg/dL (ref 0.50–1.35)
GFR calc Af Amer: 76 mL/min — ABNORMAL LOW (ref >90)
GFR calc non Af Amer: 66 mL/min — ABNORMAL LOW (ref >90)
Glucose, Bld: 91 mg/dL (ref 70–99)
Potassium: 3.7 mEq/L (ref 3.5–5.1)

## 2013-04-28 LAB — CBC
Hemoglobin: 7.5 g/dL — ABNORMAL LOW (ref 13.0–17.0)
MCH: 27.9 pg (ref 26.0–34.0)
MCHC: 33.8 g/dL (ref 30.0–36.0)
Platelets: 166 10*3/uL (ref 150–400)
RDW: 15.7 % — ABNORMAL HIGH (ref 11.5–15.5)
WBC: 10.4 10*3/uL (ref 4.0–10.5)

## 2013-04-28 MED ORDER — PNEUMOCOCCAL VAC POLYVALENT 25 MCG/0.5ML IJ INJ
0.5000 mL | INJECTION | INTRAMUSCULAR | Status: DC
  Filled 2013-04-28 (×2): qty 0.5

## 2013-04-28 MED ORDER — INFLUENZA VAC SPLIT QUAD 0.5 ML IM SUSP
0.5000 mL | INTRAMUSCULAR | Status: DC
  Filled 2013-04-28 (×2): qty 0.5

## 2013-04-28 MED ORDER — LORAZEPAM 2 MG/ML IJ SOLN
2.0000 mg | Freq: Four times a day (QID) | INTRAMUSCULAR | Status: DC | PRN
  Administered 2013-04-28 – 2013-04-29 (×2): 2 mg via INTRAVENOUS
  Filled 2013-04-28 (×2): qty 1

## 2013-04-28 MED ORDER — LORAZEPAM 2 MG/ML IJ SOLN
INTRAMUSCULAR | Status: AC
  Filled 2013-04-28: qty 1

## 2013-04-28 MED ORDER — LORAZEPAM 2 MG/ML IJ SOLN
1.0000 mg | Freq: Once | INTRAMUSCULAR | Status: AC
  Administered 2013-04-28: 13:00:00 1 mg via INTRAVENOUS

## 2013-04-28 MED ORDER — LORAZEPAM 2 MG/ML IJ SOLN
2.0000 mg | Freq: Once | INTRAMUSCULAR | Status: AC
  Administered 2013-04-28: 23:00:00 2 mg via INTRAVENOUS
  Filled 2013-04-28: qty 1

## 2013-04-28 NOTE — Progress Notes (Signed)
Pt refusing to keep telemetry box on, combative towards nursing staff. Telemetry d/ced through previous order to d/c telemetry in 48hs, all alarms reviewed by RN, pt meets discontinuation criteria.   On call MD notified about patient being combative and threatening nursing staff. Security called. New orders for prn ativan to manage detox symptoms. Will continue to monitor.  Earnest Conroy. Clelia Croft, RN

## 2013-04-28 NOTE — Progress Notes (Signed)
Unassigned patient Subjective: Since I last evaluated the patient, he has a Comptroller. He is trying to such on a popsicle. Does not co-operate with any instructions. No family availalble so far to discuss further treatment plans. He deneis having any active GI issues.  Objective: Vital signs in last 24 hours: Temp:  [98.1 F (36.7 C)-98.4 F (36.9 C)] 98.4 F (36.9 C) (09/28 0624) Pulse Rate:  [80-82] 82 (09/28 0624) Resp:  [18] 18 (09/27 2300) BP: (95-102)/(46-53) 102/53 mmHg (09/28 0624) SpO2:  [95 %-96 %] 96 % (09/28 0624)    Intake/Output from previous day: 09/27 0701 - 09/28 0700 In: 2217.5 [I.V.:2217.5] Out: 3951 [Urine:3950; Stool:1] Intake/Output this shift: Total I/O In: 360 [P.O.:360] Out: 500 [Urine:500]  General appearance: distracted, fatigued, no distress and slowed mentation Resp: clear to auscultation bilaterally Cardio: regular rate and rhythm, S1, S2 normal, no murmur, click, rub or gallop GI: soft, non-tender; bowel sounds normal; no masses,  no organomegaly Extremities: extremities normal, atraumatic, no cyanosis or edema  Lab Results:  Recent Labs  04/26/13 0835 04/26/13 2015 04/27/13 0405 04/28/13 0712  WBC 13.1*  --  13.7* 10.4  HGB 6.7* 8.8* 8.7* 7.5*  HCT 19.3* 25.4* 26.3* 22.2*  PLT 159  --  161 166   BMET  Recent Labs  04/26/13 0835 04/27/13 0405 04/28/13 0712  NA 145 141 144  K 3.9 4.0 3.7  CL 114* 111 113*  CO2 24 22 24   GLUCOSE 95 87 91  BUN 25* 22 28*  CREATININE 1.36* 1.30 1.23  CALCIUM 7.7* 7.8* 7.9*   LFT  Recent Labs  04/26/13 0835  PROT 4.4*  ALBUMIN 2.1*  AST 27  ALT 11  ALKPHOS 31*  BILITOT 0.4   PT/INR  Recent Labs  04/25/13 1930  LABPROT 14.1  INR 1.11   Medications: I have reviewed the patient's current medications.  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 Patient unable to give consent for any procedures and no family available to help with this  either. May need to discuss this with the social worker.   LOS: 3 days   Ardice Boyan 04/28/2013, 2:52 PM

## 2013-04-28 NOTE — Progress Notes (Signed)
Pt is refusing lab work this am. Pt educated on the importance of lab draw. Lab work reschedule per pt request. Will continue to monitor the pt. Roosvelt Maser, RN.

## 2013-04-28 NOTE — Progress Notes (Signed)
[facility] PROGRESS NOTE  [name] ZOX:[id number] DOB: Oct 28, 1960 DOA: 04/25/2013 PCP: [name], MD  Assessment/Plan: Severe Microcytic Anemia/Likely Upper GI Bleed -Hb has gone from 2.7 on admission to 8.7 on 9/27 after a total of 6 units of PRBCs. -No signs of active bleeding while in the hospital. -Has a h/o of a duodenal ulcer in 1/14 that required surgical correction. -With continued ETOH use, suspect recurrent PUD vs variceal bleed. -GI on board. -?timing of EGD. -Continue BID PPI.  ETOH Abuse with Withdrawals -Monitor on CIWA protocol. -Thiamine/folate -Has been agitated today and wanting to leave.   Code Status: Full Code Family Communication: Patient only  Disposition Plan: Home when medically stable.   Consultants:  GI, Dr. Elnoria Howard   Antibiotics:  None   Subjective: Curled up in bed. Per RN has been agitated and threatening to leave. Had to call security at one point.  Objective: Filed Vitals:   04/27/13 1216 04/27/13 1400 04/27/13 2300 04/28/13 0624  BP:  99/49 95/46 102/53  Pulse: 106 97 80 82  Temp:  98.8 F (37.1 C) 98.1 F (36.7 C) 98.4 F (36.9 C)  TempSrc:  Oral Oral Oral  Resp:  18 18   Height:      Weight:      SpO2:  100% 95% 96%    Intake/Output Summary (Last 24 hours) at 04/28/13 1403 Last data filed at 04/28/13 1100  Gross per 24 hour  Intake 2127.5 ml  Output   1801 ml  Net  326.5 ml   Filed Weights   04/25/13 1825 04/26/13 0445 04/27/13 0400  Weight: 72.5 kg (159 lb 13.3 oz) 72.5 kg (159 lb 13.3 oz) 74.2 kg (163 lb 9.3 oz)    Exam:   General:  Drowsy, sedated.  Cardiovascular: RRR, no M/R/G  Respiratory: Bilateral crackles.  Abdomen: S/NT/ND/+BS  Extremities: no C/C/E   Neurologic:  Non-focal  Data Reviewed: Basic Metabolic Panel:  Recent Labs Lab 04/25/13 1458 04/26/13 0835 04/27/13 0405 04/28/13 0712  NA 142 145 141 144  K 4.2 3.9 4.0 3.7  CL 108 114* 111 113*  CO2 21 24 22 24    GLUCOSE 119* 95 87 91  BUN 33* 25* 22 28*  CREATININE 1.19 1.36* 1.30 1.23  CALCIUM 8.3* 7.7* 7.8* 7.9*   Liver Function Tests:  Recent Labs Lab 04/25/13 1458 04/26/13 0835  AST 17 27  ALT 11 11  ALKPHOS 34* 31*  BILITOT 0.1* 0.4  PROT 5.0* 4.4*  ALBUMIN 2.5* 2.1*    Recent Labs Lab 04/25/13 1410  LIPASE 27    Recent Labs Lab 04/25/13 1458  AMMONIA 10*   CBC:  Recent Labs Lab 04/25/13 1458 04/26/13 0835 04/26/13 2015 04/27/13 0405 04/28/13 0712  WBC 20.7* 13.1*  --  13.7* 10.4  NEUTROABS 16.8*  --   --   --   --   HGB 2.7* 6.7* 8.8* 8.7* 7.5*  HCT 8.9* 19.3* 25.4* 26.3* 22.2*  MCV 76.1* 81.8  --  83.0 82.5  PLT 233 159  --  161 166   Cardiac Enzymes:  Recent Labs Lab 04/25/13 1458  TROPONINI <0.30   BNP (last 3 results) No results found for this basename: PROBNP,  in the last 8760 hours CBG: No results found for this basename: GLUCAP,  in the last 168 hours  Recent Results (from the past 240 hour(s))  MRSA PCR SCREENING     Status: None   Collection Time    04/25/13  6:28 PM  Result Value Range Status   MRSA by PCR NEGATIVE  NEGATIVE Final   Comment:            The GeneXpert MRSA Assay (FDA     approved for NASAL specimens     only), is one component of a     comprehensive MRSA colonization     surveillance program. It is not     intended to diagnose MRSA     infection nor to guide or     monitor treatment for     MRSA infections.     Studies: No results found.  Scheduled Meds: . antiseptic oral rinse  15 mL Mouth Rinse q12n4p  . chlorhexidine  15 mL Mouth Rinse BID  . folic acid  1 mg Oral Daily  . [START ON 04/29/2013] influenza vac split quadrivalent PF  0.5 mL Intramuscular Tomorrow-1000  . LORazepam  0-4 mg Intravenous Q12H  . LORazepam  1 mg Intravenous Once  . multivitamin with minerals  1 tablet Oral Daily  . pantoprazole (PROTONIX) IV  40 mg Intravenous Q12H  . [START ON 04/29/2013] pneumococcal 23 valent vaccine   0.5 mL Intramuscular Tomorrow-1000  . sodium chloride  3 mL Intravenous Q12H  . thiamine  100 mg Oral Daily   Or  . thiamine  100 mg Intravenous Daily   Continuous Infusions: . sodium chloride 75 mL/hr (04/28/13 1158)    Principal Problem:   Anemia Active Problems:   Status post laparotomy with oversew of bleeding duodenal artery   Duodenal ulcer   Alcohol abuse   Acute encephalopathy    Time spent: 35 minutes    [name],[name]  [facility] Pager [phone]  If 7PM-7AM, please contact night-coverage at [email or web address], password [facility] 04/28/2013, 2:03 PM  LOS: 3 days

## 2013-04-29 LAB — CBC
HCT: 20.3 % — ABNORMAL LOW (ref 39.0–52.0)
Platelets: 154 10*3/uL (ref 150–400)
RBC: 2.51 MIL/uL — ABNORMAL LOW (ref 4.22–5.81)
RDW: 15 % (ref 11.5–15.5)
WBC: 9 10*3/uL (ref 4.0–10.5)

## 2013-04-29 MED ORDER — LORAZEPAM 2 MG/ML IJ SOLN
1.0000 mg | Freq: Once | INTRAMUSCULAR | Status: AC
  Administered 2013-04-29: 1 mg via INTRAVENOUS
  Filled 2013-04-29: qty 1

## 2013-04-29 NOTE — Care Management Note (Addendum)
    Page 1 of 1   04/29/2013     11:46:24 AM   CARE MANAGEMENT NOTE 04/29/2013  Patient:  [name], [name]   Account Number:  [id number]  Date Initiated:  04/26/2013  Documentation initiated by:  DAVIS,RHONDA  Subjective/Objective Assessment:   etoh abuse and ams     Action/Plan:   home   Anticipated DC Date:  04/29/2013   Anticipated DC Plan:  HOME/SELF CARE         Choice offered to / List presented to:             Status of service:  Completed, signed off Medicare Important Message given?   (If response is "NO", the following Medicare IM given date fields will be blank) Date Medicare IM given:   Date Additional Medicare IM given:    Discharge Disposition:  AGAINST MEDICAL ADVICE  Per UR Regulation:  Reviewed for med. necessity/level of care/duration of stay  If discussed at Long Length of Stay Meetings, dates discussed:    Comments:  04/29/13 [facility] RN,BSN NCM [phone] LEFT AMA.  [id number] Earlene Plater, RN, BSN, CCM: CHART REVIEWED AND UPDATED.  Next chart review due on [id number]. NO DISCHARGE NEEDS PRESENT AT THIS TIME. CASE MANAGEMENT [phone]

## 2013-04-29 NOTE — Progress Notes (Signed)
Security called to unit, as pt combative, yelling and pulling at lines and cords. PRN dose of Ativan given at 7:11am and pt calmed for 30 min. Patient will need physical restraints if more chemical restraints not ordered-for self and staff safety.

## 2013-04-29 NOTE — Discharge Summary (Signed)
Patient was here for Anemia with a prior h/o GI Bleed and an initial Hb of 2.7. He was transfused a total of 6 units of PRBCs. He became upset because he has been kept NPO for EGD. He decided to leave the hospital AMA today. No prescriptions provided to the patient.  Peggye Pitt, MD [facility] Pager: [phone]

## 2013-04-30 ENCOUNTER — Inpatient Hospital Stay (HOSPITAL_COMMUNITY): Payer: Medicaid Other

## 2013-04-30 ENCOUNTER — Inpatient Hospital Stay (HOSPITAL_COMMUNITY)
Admission: EM | Admit: 2013-04-30 | Discharge: 2013-05-05 | DRG: 377 | Disposition: A | Discharge status: Home / Self Care | Payer: Medicaid Other | Attending: Family Medicine | Admitting: Family Medicine

## 2013-04-30 ENCOUNTER — Encounter (HOSPITAL_COMMUNITY): Payer: Self-pay | Admitting: Emergency Medicine

## 2013-04-30 DIAGNOSIS — D649 Anemia, unspecified: Secondary | ICD-10-CM

## 2013-04-30 DIAGNOSIS — I10 Essential (primary) hypertension: Secondary | ICD-10-CM | POA: Diagnosis present

## 2013-04-30 DIAGNOSIS — K2980 Duodenitis without bleeding: Secondary | ICD-10-CM | POA: Diagnosis present

## 2013-04-30 DIAGNOSIS — F17200 Nicotine dependence, unspecified, uncomplicated: Secondary | ICD-10-CM | POA: Diagnosis present

## 2013-04-30 DIAGNOSIS — F22 Delusional disorders: Secondary | ICD-10-CM | POA: Diagnosis present

## 2013-04-30 DIAGNOSIS — K922 Gastrointestinal hemorrhage, unspecified: Secondary | ICD-10-CM | POA: Diagnosis present

## 2013-04-30 DIAGNOSIS — F1010 Alcohol abuse, uncomplicated: Secondary | ICD-10-CM | POA: Diagnosis present

## 2013-04-30 DIAGNOSIS — D509 Iron deficiency anemia, unspecified: Secondary | ICD-10-CM | POA: Diagnosis present

## 2013-04-30 DIAGNOSIS — R55 Syncope and collapse: Secondary | ICD-10-CM | POA: Diagnosis present

## 2013-04-30 DIAGNOSIS — E43 Unspecified severe protein-calorie malnutrition: Secondary | ICD-10-CM | POA: Diagnosis present

## 2013-04-30 DIAGNOSIS — D500 Iron deficiency anemia secondary to blood loss (chronic): Secondary | ICD-10-CM | POA: Diagnosis present

## 2013-04-30 DIAGNOSIS — IMO0002 Reserved for concepts with insufficient information to code with codable children: Secondary | ICD-10-CM

## 2013-04-30 DIAGNOSIS — K219 Gastro-esophageal reflux disease without esophagitis: Secondary | ICD-10-CM | POA: Diagnosis present

## 2013-04-30 DIAGNOSIS — F29 Unspecified psychosis not due to a substance or known physiological condition: Secondary | ICD-10-CM | POA: Diagnosis present

## 2013-04-30 DIAGNOSIS — G9340 Encephalopathy, unspecified: Secondary | ICD-10-CM | POA: Diagnosis present

## 2013-04-30 DIAGNOSIS — K264 Chronic or unspecified duodenal ulcer with hemorrhage: Principal | ICD-10-CM | POA: Diagnosis present

## 2013-04-30 DIAGNOSIS — D72829 Elevated white blood cell count, unspecified: Secondary | ICD-10-CM | POA: Diagnosis present

## 2013-04-30 DIAGNOSIS — D62 Acute posthemorrhagic anemia: Secondary | ICD-10-CM | POA: Diagnosis present

## 2013-04-30 DIAGNOSIS — N179 Acute kidney failure, unspecified: Secondary | ICD-10-CM | POA: Diagnosis not present

## 2013-04-30 LAB — CBC
Hemoglobin: 5.3 g/dL — CL (ref 13.0–17.0)
MCH: 26.6 pg (ref 26.0–34.0)
MCV: 79.9 fL (ref 78.0–100.0)
RBC: 1.99 MIL/uL — ABNORMAL LOW (ref 4.22–5.81)

## 2013-04-30 LAB — URINALYSIS, ROUTINE W REFLEX MICROSCOPIC
Bilirubin Urine: NEGATIVE
Glucose, UA: NEGATIVE mg/dL
Leukocytes, UA: NEGATIVE
Nitrite: NEGATIVE
Specific Gravity, Urine: 1.017 (ref 1.005–1.030)
Urobilinogen, UA: 0.2 mg/dL (ref 0.0–1.0)
pH: 5 (ref 5.0–8.0)

## 2013-04-30 LAB — CBC WITH DIFFERENTIAL/PLATELET
Basophils Absolute: 0 10*3/uL (ref 0.0–0.1)
Basophils Relative: 0 % (ref 0–1)
HCT: 16.8 % — ABNORMAL LOW (ref 39.0–52.0)
Hemoglobin: 5.7 g/dL — CL (ref 13.0–17.0)
Lymphocytes Relative: 18 % (ref 12–46)
MCH: 27 pg (ref 26.0–34.0)
MCHC: 33.9 g/dL (ref 30.0–36.0)
Monocytes Absolute: 0.9 10*3/uL (ref 0.1–1.0)
Monocytes Relative: 8 % (ref 3–12)
Neutro Abs: 8.7 10*3/uL — ABNORMAL HIGH (ref 1.7–7.7)
Neutrophils Relative %: 73 % (ref 43–77)
Platelets: 167 10*3/uL (ref 150–400)
WBC: 11.8 10*3/uL — ABNORMAL HIGH (ref 4.0–10.5)

## 2013-04-30 LAB — RAPID URINE DRUG SCREEN, HOSP PERFORMED
Amphetamines: NOT DETECTED
Barbiturates: NOT DETECTED
Benzodiazepines: NOT DETECTED
Tetrahydrocannabinol: NOT DETECTED

## 2013-04-30 LAB — BASIC METABOLIC PANEL
BUN: 24 mg/dL — ABNORMAL HIGH (ref 6–23)
Calcium: 8.1 mg/dL — ABNORMAL LOW (ref 8.4–10.5)
Chloride: 107 mEq/L (ref 96–112)
GFR calc Af Amer: >90 mL/min (ref >90)
Potassium: 3.6 mEq/L (ref 3.5–5.1)
Sodium: 139 mEq/L (ref 135–145)

## 2013-04-30 LAB — AMMONIA: Ammonia: 23 umol/L (ref 11–60)

## 2013-04-30 LAB — MAGNESIUM: Magnesium: 2 mg/dL (ref 1.5–2.5)

## 2013-04-30 LAB — PREPARE RBC (CROSSMATCH)

## 2013-04-30 LAB — PROTIME-INR: INR: 1.06 (ref 0.00–1.49)

## 2013-04-30 LAB — APTT: aPTT: 29 seconds (ref 24–37)

## 2013-04-30 MED ORDER — LORAZEPAM 1 MG PO TABS
1.0000 mg | ORAL_TABLET | Freq: Four times a day (QID) | ORAL | Status: AC | PRN
  Administered 2013-05-02 – 2013-05-03 (×2): 1 mg via ORAL
  Filled 2013-04-30 (×2): qty 1

## 2013-04-30 MED ORDER — MAGNESIUM CITRATE PO SOLN: 1.0000 | Freq: Once | ORAL | Status: AC | PRN

## 2013-04-30 MED ORDER — ADULT MULTIVITAMIN W/MINERALS CH
1.0000 | ORAL_TABLET | Freq: Every day | ORAL | Status: DC
  Administered 2013-04-30 – 2013-05-05 (×6): 1 via ORAL
  Filled 2013-04-30 (×6): qty 1

## 2013-04-30 MED ORDER — SODIUM CHLORIDE 0.9 % IV BOLUS (SEPSIS)
1000.0000 mL | Freq: Once | INTRAVENOUS | Status: AC
  Administered 2013-04-30: 1000 mL via INTRAVENOUS

## 2013-04-30 MED ORDER — ONDANSETRON HCL 4 MG/2ML IJ SOLN: 4.0000 mg | Freq: Four times a day (QID) | INTRAMUSCULAR | Status: DC | PRN

## 2013-04-30 MED ORDER — ACETAMINOPHEN 325 MG PO TABS
650.0000 mg | ORAL_TABLET | Freq: Once | ORAL | Status: AC
  Administered 2013-04-30: 650 mg via ORAL
  Filled 2013-04-30: qty 2

## 2013-04-30 MED ORDER — SODIUM CHLORIDE 0.9 % IV SOLN
INTRAVENOUS | Status: DC
  Administered 2013-05-01 – 2013-05-03 (×4): via INTRAVENOUS
  Administered 2013-05-04: 1000 mL via INTRAVENOUS

## 2013-04-30 MED ORDER — PANTOPRAZOLE SODIUM 40 MG IV SOLR
40.0000 mg | Freq: Two times a day (BID) | INTRAVENOUS | Status: DC
  Filled 2013-04-30 (×2): qty 40

## 2013-04-30 MED ORDER — LORAZEPAM 2 MG/ML IJ SOLN
0.0000 mg | Freq: Four times a day (QID) | INTRAMUSCULAR | Status: AC
  Administered 2013-05-01: 1 mg via INTRAVENOUS
  Administered 2013-05-01: 2 mg via INTRAVENOUS
  Administered 2013-05-02: 1 mg via INTRAVENOUS
  Filled 2013-04-30 (×3): qty 1

## 2013-04-30 MED ORDER — SODIUM CHLORIDE 0.9 % IV SOLN
80.0000 mg | Freq: Once | INTRAVENOUS | Status: AC
  Administered 2013-04-30: 19:00:00 80 mg via INTRAVENOUS
  Filled 2013-04-30: qty 80

## 2013-04-30 MED ORDER — ACETAMINOPHEN 650 MG RE SUPP: 650.0000 mg | Freq: Four times a day (QID) | RECTAL | Status: DC | PRN

## 2013-04-30 MED ORDER — DIPHENHYDRAMINE HCL 25 MG PO CAPS
25.0000 mg | ORAL_CAPSULE | Freq: Once | ORAL | Status: AC
  Administered 2013-04-30: 25 mg via ORAL
  Filled 2013-04-30: qty 1

## 2013-04-30 MED ORDER — LORAZEPAM 2 MG/ML IJ SOLN
0.0000 mg | Freq: Two times a day (BID) | INTRAMUSCULAR | Status: AC
  Administered 2013-05-03: 1 mg via INTRAVENOUS
  Filled 2013-04-30: qty 1

## 2013-04-30 MED ORDER — SODIUM CHLORIDE 0.9 % IV BOLUS (SEPSIS): 1000.0000 mL | Freq: Once | INTRAVENOUS | Status: DC

## 2013-04-30 MED ORDER — BIOTENE DRY MOUTH MT LIQD
15.0000 mL | Freq: Two times a day (BID) | OROMUCOSAL | Status: DC
  Administered 2013-04-30 – 2013-05-05 (×8): 15 mL via OROMUCOSAL

## 2013-04-30 MED ORDER — POLYETHYLENE GLYCOL 3350 17 G PO PACK
17.0000 g | PACK | Freq: Every day | ORAL | Status: DC | PRN
  Filled 2013-04-30: qty 1

## 2013-04-30 MED ORDER — VITAMIN B-1 100 MG PO TABS
100.0000 mg | ORAL_TABLET | Freq: Every day | ORAL | Status: DC
  Administered 2013-04-30 – 2013-05-05 (×6): 100 mg via ORAL
  Filled 2013-04-30 (×6): qty 1

## 2013-04-30 MED ORDER — LORAZEPAM 2 MG/ML IJ SOLN
1.0000 mg | Freq: Four times a day (QID) | INTRAMUSCULAR | Status: AC | PRN
  Administered 2013-05-01: 1 mg via INTRAVENOUS
  Filled 2013-04-30: qty 1

## 2013-04-30 MED ORDER — OXYCODONE HCL 5 MG PO TABS: 5.0000 mg | ORAL_TABLET | ORAL | Status: DC | PRN

## 2013-04-30 MED ORDER — VITAMIN B-1 100 MG PO TABS: 100.0000 mg | ORAL_TABLET | Freq: Once | ORAL | Status: DC

## 2013-04-30 MED ORDER — FUROSEMIDE 10 MG/ML IJ SOLN
20.0000 mg | Freq: Once | INTRAMUSCULAR | Status: AC
  Administered 2013-05-01: 20 mg via INTRAVENOUS
  Filled 2013-04-30: qty 2

## 2013-04-30 MED ORDER — DIPHENHYDRAMINE HCL 25 MG PO CAPS
ORAL_CAPSULE | ORAL | Status: AC
  Filled 2013-04-30: qty 1

## 2013-04-30 MED ORDER — SODIUM CHLORIDE 0.9 % IJ SOLN
3.0000 mL | Freq: Two times a day (BID) | INTRAMUSCULAR | Status: DC
  Administered 2013-05-01 – 2013-05-05 (×6): 3 mL via INTRAVENOUS

## 2013-04-30 MED ORDER — ONDANSETRON HCL 4 MG PO TABS: 4.0000 mg | ORAL_TABLET | Freq: Four times a day (QID) | ORAL | Status: DC | PRN

## 2013-04-30 MED ORDER — FOLIC ACID 1 MG PO TABS
1.0000 mg | ORAL_TABLET | Freq: Every day | ORAL | Status: DC
  Administered 2013-04-30 – 2013-05-05 (×6): 1 mg via ORAL
  Filled 2013-04-30 (×6): qty 1

## 2013-04-30 MED ORDER — SORBITOL 70 % SOLN
30.0000 mL | Freq: Every day | Status: DC | PRN
  Filled 2013-04-30: qty 30

## 2013-04-30 MED ORDER — THIAMINE HCL 100 MG/ML IJ SOLN
100.0000 mg | Freq: Every day | INTRAMUSCULAR | Status: DC
  Filled 2013-04-30 (×6): qty 1

## 2013-04-30 MED ORDER — SODIUM CHLORIDE 0.9 % IV SOLN
8.0000 mg/h | INTRAVENOUS | Status: DC
  Administered 2013-04-30 – 2013-05-02 (×4): 8 mg/h via INTRAVENOUS
  Filled 2013-04-30 (×9): qty 80

## 2013-04-30 MED ORDER — ACETAMINOPHEN 325 MG PO TABS: 650.0000 mg | ORAL_TABLET | Freq: Four times a day (QID) | ORAL | Status: DC | PRN

## 2013-04-30 NOTE — ED Notes (Signed)
When asking patient what brings him here today, pt states "like I know". Pt is unwilling to state why he is here and is being very loud and rude. Pt wants to be left alone at this time.

## 2013-04-30 NOTE — Progress Notes (Signed)
Informed per Dr. Ardyth Harps that pt is leaving AMA. Security called back to unit as pt is cursing at Clinical research associate, and yelling that he is leaving. Call to pt's family member Marcelino Duster, and she states she will try to leave work and come over and pick him up. She states he is constantly high on crack and will not adhere to any medical plan. MD aware. Pt dressing himself and leaving with security.

## 2013-04-30 NOTE — ED Notes (Signed)
Family number is [phone]

## 2013-04-30 NOTE — H&P (Signed)
[facility] History and Physical  [name] QMV:[id number] DOB: July 29, 1961 DOA: 04/30/2013  Referring physician: Dr Wilkie Aye PCP: Ryan Plum, MD  Specialists:   Chief Complaint: weakness/dizziness  HPI: [name] is a 52 y.o. male  With history of bleeding duodenal ulcer in January of 2014 40 underwent emergent surgery, history of chronic alcohol abuse who was initially admitted on 04/25/2013 with severe microcytic anemia with a hemoglobin of 2.7 possible upper GI bleed and an acute encephalopathy who left AGAINST MEDICAL ADVICE on 04/29/2013 who presents back to the ED with generalized weakness, syncope, nonbloody emesis per niece who is the guardian. On assessing the patient patient does endorse some dizziness states she was so weak he could not walk and also some constipation. Patient denies any emesis, denies any blood in his stool, denies any fever, no chills, no nausea, no abdominal pain, no diarrhea. Patient denies any alcohol use as he states he had normally to buy any alcohol. In the emergency room CBC which was done showed a hemoglobin of 5.7 and a white count of 11.8 otherwise was within normal limits. Basic metabolic profile done was unremarkable. Patient was ordered 1 unit of packed red blood cells. We will called to admit the patient and for further evaluation and management.  Review of Systems: The patient denies anorexia, fever, weight loss,, vision loss, decreased hearing, hoarseness, chest pain, syncope, dyspnea on exertion, peripheral edema, balance deficits, hemoptysis, abdominal pain, melena, hematochezia, severe indigestion/heartburn, hematuria, incontinence, genital sores, muscle weakness, suspicious skin lesions, transient blindness, difficulty walking, depression, unusual weight change, abnormal bleeding, enlarged lymph nodes, angioedema, and breast masses.   Past Medical History  Diagnosis Date  . Abdominal  pain, other specified site   . GERD  (gastroesophageal reflux disease)   . Abnormal CT scan, esophagus 03/2012  . Substance abuse 03/2012    tox screen positive for cocaine.    Past Surgical History  Procedure Laterality Date  . Undescended testicle      on right. noted on 03/2012 CT scan.  no surgery referral.   . Esophagogastroduodenoscopy  08/10/2012    Procedure: ESOPHAGOGASTRODUODENOSCOPY (EGD);  Surgeon: [name], MD;  Location: [facility] ENDOSCOPY;  Service: Endoscopy;  Laterality: N/A;  will be done in room 1   . Laparotomy  08/10/2012    Procedure: EXPLORATORY LAPAROTOMY;  Surgeon: [name], MD;  Location: [facility] OR;  Service: General;  Laterality: N/A;   Social History:  reports that he has been smoking.  He has never used smokeless tobacco. He reports that  drinks alcohol. He reports that he uses illicit drugs (Marijuana).  Allergies  Allergen Reactions  . Chocolate Nausea And Vomiting  . Peanut-Containing Drug Products Nausea And Vomiting  . Spinach Nausea And Vomiting    History reviewed. No pertinent family history.   Prior to Admission medications   Not on File   Physical Exam: Filed Vitals:   04/30/13 1900  BP: 159/91  Pulse: 110  Temp:   Resp: 34     General:  Well-developed disheveled looking gentleman in no acute cardiopulmonary distress  Eyes: Pupils equal round reactive to light and accommodation. Extraocular movements intact.  ENT: Oropharynx is clear, edentulous, poor dentition, no lesions no exudates.   Neck: Supple no lymphadenopathy.  Cardiovascular: Tachycardic regular rhythm no murmurs rubs or gallops  Respiratory: Clear to auscultation bilaterally. No wheezes, no crackles, no rhonchi.  Abdomen: Soft, nontender, nondistended, positive bowel sounds.  Skin: No rashes or lesions.  Musculoskeletal: 4/5 bilateral upper  extremity strength,4/5 bilateral lower extremity strength.  Psychiatric: flat affect. Flat mood. Poor insight. Poor judgment.  Neurologic: alert to self. Moving  all extremities spontaneously.  Labs on Admission:  Basic Metabolic Panel:  Recent Labs Lab 04/25/13 1458 04/26/13 0835 04/27/13 0405 04/28/13 0712 04/30/13 1640  NA 142 145 141 144 139  K 4.2 3.9 4.0 3.7 3.6  CL 108 114* 111 113* 107  CO2 21 24 22 24 24   GLUCOSE 119* 95 87 91 95  BUN 33* 25* 22 28* 24*  CREATININE 1.19 1.36* 1.30 1.23 0.97  CALCIUM 8.3* 7.7* 7.8* 7.9* 8.1*   Liver Function Tests:  Recent Labs Lab 04/25/13 1458 04/26/13 0835  AST 17 27  ALT 11 11  ALKPHOS 34* 31*  BILITOT 0.1* 0.4  PROT 5.0* 4.4*  ALBUMIN 2.5* 2.1*    Recent Labs Lab 04/25/13 1410  LIPASE 27    Recent Labs Lab 04/25/13 1458  AMMONIA 10*   CBC:  Recent Labs Lab 04/25/13 1458 04/26/13 0835 04/26/13 2015 04/27/13 0405 04/28/13 0712 04/29/13 0505 04/30/13 1640  WBC 20.7* 13.1*  --  13.7* 10.4 9.0 11.8*  NEUTROABS 16.8*  --   --   --   --   --  8.7*  HGB 2.7* 6.7* 8.8* 8.7* 7.5* 7.0* 5.7*  HCT 8.9* 19.3* 25.4* 26.3* 22.2* 20.3* 16.8*  MCV 76.1* 81.8  --  83.0 82.5 80.9 79.6  PLT 233 159  --  161 166 154 167   Cardiac Enzymes:  Recent Labs Lab 04/25/13 1458  TROPONINI <0.30    BNP (last 3 results) No results found for this basename: PROBNP,  in the last 8760 hours CBG: No results found for this basename: GLUCAP,  in the last 168 hours  Radiological Exams on Admission: Dg Chest Port 1 View  04/30/2013   CLINICAL DATA:  Leukocytosis and weakness.  EXAM: PORTABLE CHEST - 1 VIEW  COMPARISON:  03/23/2013  FINDINGS: The heart size and mediastinal contours are within normal limits. Stable bilateral parenchymal scarring present. There is atelectasis at the right lung base. The visualized skeletal structures are unremarkable.  IMPRESSION: No focal infiltrate identified. Atelectasis present at the right lung base.   Electronically Signed   By: Irish Lack   On: 04/30/2013 19:16    EKG: Independently reviewed. sinus tachycardia  Assessment/Plan Principal  Problem:   GIB (gastrointestinal bleeding) Active Problems:   Anemia due to GI blood loss   Hypertension   Alcohol abuse   Encephalopathy acute   Anemia   Acute encephalopathy   Leukocytosis   Microcytic anemia   Syncope   #1 microcytic anemia/probable upper GI bleed Patient was admitted on 04/25/2013 with a microcytic anemia FOBT was positive hemoglobin at the time of admission was 2.7. Patient was transfused a total of 6 units packed red blood cells during that time however patient left AMA on 04/29/2013. Patient presented back with generalized weakness syncope and dizziness. Patient with history of alcohol abuse. Hemoglobin is 5.7. Will type and cross. Transfuse 4 units of packed red blood cells. Place on a Protonix drip. Check PT/ PTT/ INR. Will check serial CBC every 8 hours. Spoke with Dr [name] who will assess patient for possible endoscopy in the morning.   #2 acute encephalopathy Questionable etiology. May be secondary to history of alcohol abuse. Ammonia levels are pending. Check a UA with cultures and sensitivities. Check a chest x-ray. 2 rule out infectious etiologies. Check a B12, RPR, RBC folate. Will consult with  psychiatry for capacity. Follow.  #3 history of alcohol abuse Will place on Ativan withdrawal protocol. Alcohol cessation.  #4 Syncope Likely secondary to #1. Will check a CT of the head. Monitor on telemetry.  #5 leukocytosis Likely reactive leukocytosis. Check a UA with cultures and sensitivities. Check a chest x-ray. Hold off on antibiotics at this time. Follow.  #6 history of polysubstance abuse UDS pending. Follow.  #7 prophylaxis PPI for GI prophylaxis. SCDs for DVT prophylaxis.    Code Status: Full Family Communication: updated patient and niece who states she is his guardian Marcelino Duster [phone]) Disposition Plan: Admit to SDU  Time spent: 65 mins  [facility] Pager [phone]  If 7PM-7AM, please contact  night-coverage [email or web address] Password [facility] 04/30/2013, 7:36 PM

## 2013-04-30 NOTE — ED Notes (Signed)
Patient refused to finish orthostatic vital signs.

## 2013-04-30 NOTE — ED Notes (Addendum)
Per Caregiver: Pt discharged himself yesterday without dr's order, pt was not complaint with procedures. Pt is now back with SOB, weakness, abd pain and dizziness. Caregiver states that mentally pt is not at normal.

## 2013-05-01 ENCOUNTER — Encounter (HOSPITAL_COMMUNITY)
Admission: EM | Disposition: A | Discharge status: Home / Self Care | Payer: Self-pay | Source: Home / Self Care | Attending: Family Medicine

## 2013-05-01 ENCOUNTER — Encounter (HOSPITAL_COMMUNITY): Payer: Self-pay | Admitting: *Deleted

## 2013-05-01 DIAGNOSIS — F29 Unspecified psychosis not due to a substance or known physiological condition: Secondary | ICD-10-CM

## 2013-05-01 HISTORY — PX: ESOPHAGOGASTRODUODENOSCOPY: SHX5428

## 2013-05-01 LAB — COMPREHENSIVE METABOLIC PANEL
ALT: 14 U/L (ref 0–53)
BUN: 20 mg/dL (ref 6–23)
CO2: 24 mEq/L (ref 19–32)
Calcium: 7.7 mg/dL — ABNORMAL LOW (ref 8.4–10.5)
Creatinine, Ser: 1.06 mg/dL (ref 0.50–1.35)
GFR calc Af Amer: >90 mL/min (ref >90)
GFR calc non Af Amer: 79 mL/min — ABNORMAL LOW (ref >90)
Glucose, Bld: 94 mg/dL (ref 70–99)
Total Bilirubin: 0.3 mg/dL (ref 0.3–1.2)
Total Protein: 4.3 g/dL — ABNORMAL LOW (ref 6.0–8.3)

## 2013-05-01 LAB — PROTIME-INR
INR: 1.04 (ref 0.00–1.49)
Prothrombin Time: 13.4 seconds (ref 11.6–15.2)

## 2013-05-01 LAB — IRON AND TIBC: Iron: <10 ug/dL — ABNORMAL LOW (ref 42–135)

## 2013-05-01 LAB — FOLATE: Folate: 10.2 ng/mL

## 2013-05-01 LAB — CBC
HCT: 26.1 % — ABNORMAL LOW (ref 39.0–52.0)
MCHC: 33.7 g/dL (ref 30.0–36.0)
MCV: 80.8 fL (ref 78.0–100.0)
RDW: 15.4 % (ref 11.5–15.5)

## 2013-05-01 LAB — VITAMIN B12: Vitamin B-12: 502 pg/mL (ref 211–911)

## 2013-05-01 SURGERY — EGD (ESOPHAGOGASTRODUODENOSCOPY)
Anesthesia: Moderate Sedation | Laterality: Left

## 2013-05-01 MED ORDER — DIPHENHYDRAMINE HCL 50 MG/ML IJ SOLN
INTRAMUSCULAR | Status: AC
  Filled 2013-05-01: qty 1

## 2013-05-01 MED ORDER — FENTANYL CITRATE 0.05 MG/ML IJ SOLN
INTRAMUSCULAR | Status: AC
  Filled 2013-05-01: qty 4

## 2013-05-01 MED ORDER — EPINEPHRINE HCL 0.1 MG/ML IJ SOSY
PREFILLED_SYRINGE | INTRAMUSCULAR | Status: AC
  Filled 2013-05-01: qty 10

## 2013-05-01 MED ORDER — BOOST / RESOURCE BREEZE PO LIQD
1.0000 | Freq: Two times a day (BID) | ORAL | Status: DC
  Administered 2013-05-01 – 2013-05-05 (×6): 1 via ORAL

## 2013-05-01 MED ORDER — DIPHENHYDRAMINE HCL 50 MG/ML IJ SOLN
INTRAMUSCULAR | Status: DC | PRN
  Administered 2013-05-01: 25 mg via INTRAVENOUS

## 2013-05-01 MED ORDER — MIDAZOLAM HCL 10 MG/2ML IJ SOLN
INTRAMUSCULAR | Status: AC
  Filled 2013-05-01: qty 4

## 2013-05-01 MED ORDER — FUROSEMIDE 10 MG/ML IJ SOLN
20.0000 mg | Freq: Once | INTRAMUSCULAR | Status: AC
  Administered 2013-05-01: 20 mg via INTRAVENOUS

## 2013-05-01 MED ORDER — SUCRALFATE 1 GM/10ML PO SUSP
1.0000 g | Freq: Three times a day (TID) | ORAL | Status: DC
  Administered 2013-05-01 – 2013-05-05 (×17): 1 g via ORAL
  Filled 2013-05-01 (×20): qty 10

## 2013-05-01 MED ORDER — FENTANYL CITRATE 0.05 MG/ML IJ SOLN
INTRAMUSCULAR | Status: DC | PRN
  Administered 2013-05-01 (×3): 25 ug via INTRAVENOUS

## 2013-05-01 MED ORDER — SODIUM CHLORIDE 0.9 % IV SOLN: INTRAVENOUS | Status: DC

## 2013-05-01 MED ORDER — MIDAZOLAM HCL 10 MG/2ML IJ SOLN
INTRAMUSCULAR | Status: DC | PRN
  Administered 2013-05-01 (×3): 2 mg via INTRAVENOUS
  Administered 2013-05-01: 1 mg via INTRAVENOUS

## 2013-05-01 MED ORDER — LIDOCAINE VISCOUS 2 % MT SOLN
OROMUCOSAL | Status: AC
  Filled 2013-05-01: qty 15

## 2013-05-01 MED ORDER — FUROSEMIDE 10 MG/ML IJ SOLN
INTRAMUSCULAR | Status: AC
  Administered 2013-05-01: 20 mg via INTRAVENOUS
  Filled 2013-05-01: qty 2

## 2013-05-01 MED ORDER — SODIUM CHLORIDE 0.9 % IJ SOLN
PREFILLED_SYRINGE | INTRAMUSCULAR | Status: DC | PRN
  Administered 2013-05-01: 08:00:00

## 2013-05-01 NOTE — Progress Notes (Signed)
OT Cancellation Note  Patient Details Name: [name] MRN: [id number] DOB: October 10, 1960   Cancelled Treatment:    Reason Eval/Treat Not Completed: Patient declined, no reason specified. Pt eating lunch and meeting with SW.  Pt agreed to OT next day.  Alba Cory 05/01/2013, 1:08 PM

## 2013-05-01 NOTE — Op Note (Signed)
[facility] [address] [place], [address]   OPERATIVE PROCEDURE REPORT  PATIENT :[name], [name]  MR#: [id number] BIRTHDATE :1961-01-23 GENDER: Male ENDOSCOPIST: Dr.  Lorenza Burton, MD ASSISTANT:   Beryle Beams, technician Karoline Caldwell, RN, BSN PROCEDURE DATE: May 06, 2013 PRE-PROCEDURE PREPERATION: Patient fasted for 8 hours prior to the procedure. PRE-PROCEDURE PHYSICAL: Patient has stable vital signs.  Neck is supple.  There is no JVD, thyromegaly or LAD.  Chest clear to auscultation.  S1 and S2 regular.  Abdomen soft, non-distended, non-tender with NABS. PROCEDURE:     EGD w/ control of bleeding ASA CLASS:     Class III INDICATIONS:     Acute post hemorrhagic anemia and melena.Marland Kitchen MEDICATIONS:     Fentanyl 75 mcg, Versed 7 mg and Benadryl 25 mg IV.  TOPICAL ANESTHETIC:   Viscous xylocaine-15 cc PO.  DESCRIPTION OF PROCEDURE: After the risks benefits and alternatives of the procedure were thoroughly explained, informed consent was obtained.  The Pentax Gastroscope Peds [id number]  was introduced through the mouth and advanced to the second portion of the duodenum , without limitations. The instrument was slowly withdrawn as the mucosa was fully examined.   The esophagus, GEJ and the stomach appeared normal. There was an post-bulbar duodenal ullcer with a large visible vessel that was oozing. 6 cc's of 1:10, 000 epinephrine was injected around the ulcer base and hemostasis was achieved. The small bowel distal to the ulcer upto 60 cm appeared normal. Sutures were noted along with duodenitis in the duodenal bulb. No masses or polyps noted. Retroflexed views revealed no abnormalities. The scope was then withdrawn from the patient and the procedure terminated. The patient tolerated the procedure without immediate complications.  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  RECOMMENDATIONS:     1) Continue PPI's. 2) Clear liquid diet. 3)  Carafate suspension 1 gm QID PO.  REPEAT EXAM:  None planned for now.  DISCHARGE INSTRUCTIONS: standard instructions given.  _______________________________ eSigned:  Dr. Lorenza Burton, MD 2013-05-06 8:00 AM   CPT CODES:     43255, EGD with Control Hemorrhage  DIAGNOSIS CODES:     280.9,  578.1   CC: [facility]-Dr. Ramiro Harvest.  PATIENT NAME:  [name], [name] MR#: [id number]

## 2013-05-01 NOTE — Evaluation (Signed)
Physical Therapy Evaluation Patient Details Name: [name] MRN: [id number] DOB: 23-Mar-1961 Today's Date: 05/01/2013 Time: 9629-5284 PT Time Calculation (min): 26 min  PT Assessment / Plan / Recommendation History of Present Illness  52 year old ? admitted 9/292014 with severe venous, syncope after leaving AGAINST MEDICAL ADVICE 04/25/2013 severe microcytic anemia anemia, hemoglobin 2.7, acute encephalopathy -transfused 6 units total of packed red blood cells  Clinical Impression  Pt will benefit from PT to address deficits below; He is mildly disoriented on eval, no family present but follows one step commands well    PT Assessment  Patient needs continued PT services    Follow Up Recommendations  Home health PT;No PT follow up (depending on progress)    Does the patient have the potential to tolerate intense rehabilitation      Barriers to Discharge        Equipment Recommendations  None recommended by PT    Recommendations for Other Services     Frequency Min 3X/week    Precautions / Restrictions Precautions Precautions: Fall   Pertinent Vitals/Pain No c/o pain      Mobility  Bed Mobility Bed Mobility: Supine to Sit Supine to Sit: 4: Min assist Details for Bed Mobility Assistance: verbal cues for safety Transfers Transfers: Sit to Stand;Stand to Sit Sit to Stand: 4: Min assist Stand to Sit: 4: Min assist Details for Transfer Assistance: verbal cues for hand placement and safety; pt had been incontinent of urine (bed changed by PT) and RN notified Ambulation/Gait Ambulation/Gait Assistance: 4: Min assist Ambulation Distance (Feet): 200 Feet Assistive device: 1 person hand held assist;None Ambulation/Gait Assistance Details: pt requiring directional cues and min assist for balance--gait stability Gait Pattern: Step-through pattern;Trunk flexed General Gait Details: LOB x 2 with min to recover    Exercises     PT Diagnosis: Difficulty walking  PT Problem  List: Decreased strength;Decreased activity tolerance;Decreased balance;Decreased mobility;Decreased knowledge of precautions;Decreased knowledge of use of DME PT Treatment Interventions: DME instruction;Gait training;Stair training;Therapeutic activities;Therapeutic exercise;Patient/family education;Functional mobility training     PT Goals(Current goals can be found in the care plan section) Acute Rehab PT Goals PT Goal Formulation: Patient unable to participate in goal setting Time For Goal Achievement: 05/08/13 Potential to Achieve Goals: Good  Visit Information  Last PT Received On: 05/01/13 Assistance Needed: +1 History of Present Illness: 52 year old ? admitted 9/292014 with severe venous, syncope after leaving AGAINST MEDICAL ADVICE 04/25/2013 severe microcytic anemia anemia, hemoglobin 2.7, acute encephalopathy -transfused 6 units total of packed red blood cells       Prior Functioning  Home Living Family/patient expects to be discharged to:: Group home Living Arrangements: Other relatives;Non-relatives/Friends Additional Comments: pt reports he lives with 3 other people Prior Function Level of Independence: Independent    Cognition  Cognition Arousal/Alertness: Awake/alert Behavior During Therapy: WFL for tasks assessed/performed Overall Cognitive Status: Impaired/Different from baseline Area of Impairment: Orientation Orientation Level: Disoriented to;Place;Time;Situation    Extremity/Trunk Assessment Upper Extremity Assessment Upper Extremity Assessment: Defer to OT evaluation Lower Extremity Assessment Lower Extremity Assessment: Overall WFL for tasks assessed   Balance Static Standing Balance Static Standing - Balance Support: No upper extremity supported;During functional activity Static Standing - Level of Assistance: 5: Stand by assistance;4: Min assist  End of Session PT - End of Session Equipment Utilized During Treatment: Gait belt Activity Tolerance:  Patient tolerated treatment well Patient left: in bed;with call bell/phone within reach;with bed alarm set;with nursing/sitter in room  GP     [facility] 05/01/2013,  1:36 PM

## 2013-05-01 NOTE — Consult Note (Signed)
Reason for Consult: Anemia with black stools. Referring Physician: [facility]-Dr. Ramiro Harvest  [name] is an 52 y.o. male.  HPI: 52 year old black male with multiple medical problems listed below, left the hospital AMA after being admitted for a GI bleed. We were not able to get consent from any family member for his procedure and he was not co-operative during his last hospitalization. He reportedly had several black stools at home along with a syncopal episode and that prompted his family members to bring him back to the ER. He was found to have a hemoglobin of 5 gm/dl and therefore he was placed in the Stepdown unit for more PRBC's transfusions and rehydration. He had a duodenal ulcer in January that required an oversow. He denies having any abdominal pain, nausea or vomiting. He has been on Prilosec for reflux. He denies having any dysphagia or odynophagia.  Past Medical History  Diagnosis Date  . Abdominal  pain, other specified site   . GERD (gastroesophageal reflux disease)   . Abnormal CT scan, esophagus 03/2012  . Substance abuse 03/2012    tox screen positive for cocaine.    Past Surgical History  Procedure Laterality Date  . Undescended testicle      on right. noted on 03/2012 CT scan.  no surgery referral.   . Esophagogastroduodenoscopy  08/10/2012    Procedure: ESOPHAGOGASTRODUODENOSCOPY (EGD);  Surgeon: [name], MD;  Location: [facility] ENDOSCOPY;  Service: Endoscopy;  Laterality: N/A;  will be done in room 1   . Laparotomy  08/10/2012    Procedure: EXPLORATORY LAPAROTOMY;  Surgeon: [name], MD;  Location: [facility] OR;  Service: General;  Laterality: N/A;   History reviewed. No pertinent family history.  Social History:  reports that he has been smoking.  He has never used smokeless tobacco. He reports that  drinks alcohol. He reports that he uses illicit drugs (Marijuana).  Allergies:  Allergies  Allergen Reactions  . Chocolate Nausea And Vomiting  . Peanut-Containing Drug  Products Nausea And Vomiting  . Spinach Nausea And Vomiting   Medications: I have reviewed the patient's current medications.  Results for orders placed during the hospital encounter of 04/30/13 (from the past 48 hour(s))  CBC WITH DIFFERENTIAL     Status: Abnormal   Collection Time    04/30/13  4:40 PM      Result Value Range   WBC 11.8 (*) 4.0 - 10.5 K/uL   Comment: ADJUSTED FOR NUCLEATED RBC'S   RBC 2.11 (*) 4.22 - 5.81 MIL/uL   Hemoglobin 5.7 (*) 13.0 - 17.0 g/dL   Comment: RESULT REPEATED AND VERIFIED     CRITICAL RESULT CALLED TO, READ BACK BY AND VERIFIED WITH:     HAMILTON,J. AT 1706 ON 09.30.14 BY LOVE,T.   HCT 16.8 (*) 39.0 - 52.0 %   MCV 79.6  78.0 - 100.0 fL   MCH 27.0  26.0 - 34.0 pg   MCHC 33.9  30.0 - 36.0 g/dL   RDW 16.1 (*) 09.6 - 04.5 %   Platelets 167  150 - 400 K/uL   Neutrophils Relative % 73  43 - 77 %   Lymphocytes Relative 18  12 - 46 %   Monocytes Relative 8  3 - 12 %   Eosinophils Relative 1  0 - 5 %   Basophils Relative 0  0 - 1 %   Neutro Abs 8.7 (*) 1.7 - 7.7 K/uL   Lymphs Abs 2.1  0.7 - 4.0  K/uL   Monocytes Absolute 0.9  0.1 - 1.0 K/uL   Eosinophils Absolute 0.1  0.0 - 0.7 K/uL   Basophils Absolute 0.0  0.0 - 0.1 K/uL   Smear Review MORPHOLOGY UNREMARKABLE    BASIC METABOLIC PANEL     Status: Abnormal   Collection Time    04/30/13  4:40 PM      Result Value Range   Sodium 139  135 - 145 mEq/L   Potassium 3.6  3.5 - 5.1 mEq/L   Chloride 107  96 - 112 mEq/L   CO2 24  19 - 32 mEq/L   Glucose, Bld 95  70 - 99 mg/dL   BUN 24 (*) 6 - 23 mg/dL   Creatinine, Ser 1.61  0.50 - 1.35 mg/dL   Calcium 8.1 (*) 8.4 - 10.5 mg/dL   GFR calc non Af Amer >90  >90 mL/min   GFR calc Af Amer >90  >90 mL/min   Comment: (NOTE)     The eGFR has been calculated using the CKD EPI equation.     This calculation has not been validated in all clinical situations.     eGFR's persistently <90 mL/min signify possible Chronic Kidney     Disease.  TYPE AND SCREEN      Status: None   Collection Time    04/30/13  4:40 PM      Result Value Range   ABO/RH(D) O NEG     Antibody Screen POS     Sample Expiration 05/03/2013     DAT, IgG NEG     Antibody Identification ANTI-D     Unit Number [id number]     Blood Component Type RED CELLS,LR     Unit division 00     Status of Unit ISSUED     Transfusion Status OK TO TRANSFUSE     Crossmatch Result COMPATIBLE     Unit Number [id number]     Blood Component Type RED CELLS,LR     Unit division 00     Status of Unit ISSUED     Transfusion Status OK TO TRANSFUSE     Crossmatch Result COMPATIBLE     Unit Number [id number]     Blood Component Type RED CELLS,LR     Unit division 00     Status of Unit ISSUED     Transfusion Status OK TO TRANSFUSE     Crossmatch Result COMPATIBLE     Unit Number [id number]     Blood Component Type RBC LR PHER2     Unit division 00     Status of Unit ISSUED     Transfusion Status OK TO TRANSFUSE     Crossmatch Result COMPATIBLE    PREPARE RBC (CROSSMATCH)     Status: None   Collection Time    04/30/13  5:22 PM      Result Value Range   Order Confirmation ORDER PROCESSED BY BLOOD BANK    MRSA PCR SCREENING     Status: None   Collection Time    04/30/13  6:42 PM      Result Value Range   MRSA by PCR NEGATIVE  NEGATIVE   Comment:            The GeneXpert MRSA Assay (FDA     approved for NASAL specimens     only), is one component of a     comprehensive MRSA colonization     surveillance program. It is not  intended to diagnose MRSA     infection nor to guide or     monitor treatment for     MRSA infections.     Performed at [facility]  AMMONIA     Status: None   Collection Time    04/30/13  6:51 PM      Result Value Range   Ammonia 23  11 - 60 umol/L  PREPARE RBC (CROSSMATCH)     Status: None   Collection Time    04/30/13  7:00 PM      Result Value Range   Order Confirmation ORDER PROCESSED BY BLOOD BANK    TSH     Status: None    Collection Time    04/30/13  8:33 PM      Result Value Range   TSH 0.604  0.350 - 4.500 uIU/mL   Comment: Performed at [facility]  MAGNESIUM     Status: None   Collection Time    04/30/13  8:34 PM      Result Value Range   Magnesium 2.0  1.5 - 2.5 mg/dL  PHOSPHORUS     Status: None   Collection Time    04/30/13  8:34 PM      Result Value Range   Phosphorus 2.4  2.3 - 4.6 mg/dL  APTT     Status: None   Collection Time    04/30/13  8:34 PM      Result Value Range   aPTT 29  24 - 37 seconds  PROTIME-INR     Status: None   Collection Time    04/30/13  8:34 PM      Result Value Range   Prothrombin Time 13.6  11.6 - 15.2 seconds   INR 1.06  0.00 - 1.49  CBC     Status: Abnormal   Collection Time    04/30/13  8:34 PM      Result Value Range   WBC 10.4  4.0 - 10.5 K/uL   RBC 1.99 (*) 4.22 - 5.81 MIL/uL   Hemoglobin 5.3 (*) 13.0 - 17.0 g/dL   Comment: CRITICAL VALUE NOTED.  VALUE IS CONSISTENT WITH PREVIOUSLY REPORTED AND CALLED VALUE.     REPEATED TO VERIFY   HCT 15.9 (*) 39.0 - 52.0 %   MCV 79.9  78.0 - 100.0 fL   MCH 26.6  26.0 - 34.0 pg   MCHC 33.3  30.0 - 36.0 g/dL   RDW 16.1 (*) 09.6 - 04.5 %   Platelets 153  150 - 400 K/uL  VITAMIN B12     Status: None   Collection Time    04/30/13  8:34 PM      Result Value Range   Vitamin B-12 502  211 - 911 pg/mL   Comment: Performed at [facility]  FOLATE     Status: None   Collection Time    04/30/13  8:34 PM      Result Value Range   Folate 10.2     Comment: (NOTE)     Reference Ranges            Deficient:       0.4 - 3.3 ng/mL            Indeterminate:   3.4 - 5.4 ng/mL            Normal:              > 5.4 ng/mL  Performed at [facility]  IRON AND TIBC     Status: Abnormal   Collection Time    04/30/13  8:34 PM      Result Value Range   Iron <10 (*) 42 - 135 ug/dL   TIBC Not calculated due to Iron <10.  215 - 435 ug/dL   Saturation Ratios Not calculated due to Iron <10.  20 - 55 %    UIBC 255  125 - 400 ug/dL   Comment: Performed at [facility]  FERRITIN     Status: Abnormal   Collection Time    04/30/13  8:34 PM      Result Value Range   Ferritin 20 (*) 22 - 322 ng/mL   Comment: Performed at [facility]  RPR     Status: None   Collection Time    04/30/13  8:34 PM      Result Value Range   RPR NON REACTIVE  NON REACTIVE   Comment: Performed at [facility]  URINE RAPID DRUG SCREEN (HOSP PERFORMED)     Status: None   Collection Time    04/30/13  9:02 PM      Result Value Range   Opiates NONE DETECTED  NONE DETECTED   Cocaine NONE DETECTED  NONE DETECTED   Benzodiazepines NONE DETECTED  NONE DETECTED   Amphetamines NONE DETECTED  NONE DETECTED   Tetrahydrocannabinol NONE DETECTED  NONE DETECTED   Barbiturates NONE DETECTED  NONE DETECTED   Comment:            DRUG SCREEN FOR MEDICAL PURPOSES     ONLY.  IF CONFIRMATION IS NEEDED     FOR ANY PURPOSE, NOTIFY LAB     WITHIN 5 DAYS.                LOWEST DETECTABLE LIMITS     FOR URINE DRUG SCREEN     Drug Class       Cutoff (ng/mL)     Amphetamine      1000     Barbiturate      200     Benzodiazepine   200     Tricyclics       300     Opiates          300     Cocaine          300     THC              50  URINALYSIS, ROUTINE W REFLEX MICROSCOPIC     Status: None   Collection Time    04/30/13  9:02 PM      Result Value Range   Color, Urine YELLOW  YELLOW   APPearance CLEAR  CLEAR   Specific Gravity, Urine 1.017  1.005 - 1.030   pH 5.0  5.0 - 8.0   Glucose, UA NEGATIVE  NEGATIVE mg/dL   Hgb urine dipstick NEGATIVE  NEGATIVE   Bilirubin Urine NEGATIVE  NEGATIVE   Ketones, ur NEGATIVE  NEGATIVE mg/dL   Protein, ur NEGATIVE  NEGATIVE mg/dL   Urobilinogen, UA 0.2  0.0 - 1.0 mg/dL   Nitrite NEGATIVE  NEGATIVE   Leukocytes, UA NEGATIVE  NEGATIVE   Comment: MICROSCOPIC NOT DONE ON URINES WITH NEGATIVE PROTEIN, BLOOD, LEUKOCYTES, NITRITE, OR GLUCOSE <1000 mg/dL.   Ct Head Wo  Contrast  04/30/2013   CLINICAL DATA:  Syncope and weakness.  EXAM: CT HEAD WITHOUT CONTRAST  TECHNIQUE:  Contiguous axial images were obtained from the base of the skull through the vertex without intravenous contrast.  COMPARISON:  04/25/2013.  FINDINGS: The ventricles are normal in size and configuration. No extra-axial fluid collections are identified. The gray-white differentiation is normal. No CT findings for acute intracranial process such as hemorrhage or infarction. No mass lesions. The brainstem and cerebellum are grossly normal.  The bony structures are intact. The paranasal sinuses and mastoid air cells are clear. Evidence of prior sinonasal surgery. The globes are intact.  IMPRESSION: No acute intracranial findings or mass lesion.   Electronically Signed   By: Loralie Champagne M.D.   On: 04/30/2013 22:35   Dg Chest Port 1 View  04/30/2013   CLINICAL DATA:  Leukocytosis and weakness.  EXAM: PORTABLE CHEST - 1 VIEW  COMPARISON:  03/23/2013  FINDINGS: The heart size and mediastinal contours are within normal limits. Stable bilateral parenchymal scarring present. There is atelectasis at the right lung base. The visualized skeletal structures are unremarkable.  IMPRESSION: No focal infiltrate identified. Atelectasis present at the right lung base.   Electronically Signed   By: Irish Lack   On: 04/30/2013 19:16   Review of Systems  Constitutional: Negative.   HENT: Negative.   Gastrointestinal: Positive for blood in stool.  Genitourinary: Negative.   Skin: Negative.   Neurological: Positive for tingling.  Psychiatric/Behavioral: Positive for substance abuse.   Blood pressure 116/62, pulse 81, temperature 98.6 F (37 C), temperature source Oral, resp. rate 21, height 5\' 9"  (1.753 m), weight 74.6 kg (164 lb 7.4 oz), SpO2 97.00%. Physical Exam  Constitutional: He appears well-developed and well-nourished. He appears lethargic.  HENT:  Head: Normocephalic and atraumatic.  There is a  bleeding abrasion on his nose; he his teeth are in poor repair  Eyes: Conjunctivae are normal. Pupils are equal, round, and reactive to light.  Neck: Normal range of motion. Neck supple.  Cardiovascular: Normal rate and regular rhythm.   Respiratory: Effort normal and breath sounds normal.  GI: Soft. Normal appearance and bowel sounds are normal.  There is a vertical midline scar in the epigastrium from a recent laparotomy  Musculoskeletal: Normal range of motion.  Neurological: He appears lethargic.  Knows he is in the hospital but does not know what day it is or which hospital he is in  Skin: Skin is warm and dry.   Assessment/Plan: GERD, Anemia with melena in a 52 year old black male with a history of a duodenal ulcer treated endoscopically followed by an oversow: recurrent bleed; will proceed with an EGD today.   [name] 05/01/2013, 6:51 AM

## 2013-05-01 NOTE — Progress Notes (Signed)
Clinical Social Work Department CLINICAL SOCIAL WORK PSYCHIATRY SERVICE LINE ASSESSMENT 05/01/2013  Patient:  [name]  Account:  [id number]  Admit Date:  04/30/2013  Clinical Social Worker:  Unk Lightning, LCSW  Date/Time:  05/01/2013 01:15 PM Referred by:  Physician  Date referred:  05/01/2013 Reason for Referral  Competency/Guardianship   Presenting Symptoms/Problems (In the person's/family's own words):   Psych consulted to determine capacity.   Abuse/Neglect/Trauma History (check all that apply)  Denies history   Abuse/Neglect/Trauma Comments:   Psychiatric History (check all that apply)  Denies history   Psychiatric medications:  None   Current Mental Health Hospitalizations/Previous Mental Health History:   Patient denies any previous or current MH diagnosis. Patient denies any symptoms of depression or anxiety.   Current provider:   None   Place and Date:   N/A   Current Medications:   LORazepam, LORazepam, ondansetron (ZOFRAN) IV, ondansetron, oxyCODONE, polyethylene glycol, sorbitol            . antiseptic oral rinse  15 mL Mouth Rinse BID  . folic acid  1 mg Oral Daily  . LORazepam  0-4 mg Intravenous Q6H   Followed by     . [START ON 05/02/2013] LORazepam  0-4 mg Intravenous Q12H  . multivitamin with minerals  1 tablet Oral Daily  . [START ON 05/04/2013] pantoprazole (PROTONIX) IV  40 mg Intravenous Q12H  . sodium chloride  1,000 mL Intravenous Once  . sodium chloride  3 mL Intravenous Q12H  . sucralfate  1 g Oral TID WC & HS  . thiamine  100 mg Oral Daily   Or     . thiamine  100 mg Intravenous Daily   Previous Impatient Admission/Date/Reason:   None reported   Emotional Health / Current Symptoms    Suicide/Self Harm  None reported   Suicide attempt in the past:   Patient denies any SI or HI. Patient denies any previous suicide attempts.   Other harmful behavior:   None reported   Psychotic/Dissociative Symptoms  Delusional   Other  Psychotic/Dissociative Symptoms:   Caregiver reports that patient was stating that he left the hospital AMA because the RN was trying to put him on a spaceship.    Attention/Behavioral Symptoms  Restless   Other Attention / Behavioral Symptoms:   Patient frigidity throughout assessment.    Cognitive Impairment  Orientation - Place  Orientation - Self  Orientation - Situation   Other Cognitive Impairment:   Patient alert and oriented x3 but is not oriented to time.    Mood and Adjustment  Flat    Stress, Anxiety, Trauma, Any Recent Loss/Stressor  None reported   Anxiety (frequency):   N/A   Phobia (specify):   N/A   Compulsive behavior (specify):   N/A   Obsessive behavior (specify):   N/A   Other:   N/A   Substance Abuse/Use  Current substance use   SBIRT completed (please refer for detailed history):  Y  Self-reported substance use:   Patient reports he has not drank any alcohol since September 13th because he has not been feeling well. Patient does not feel he has any addiction to alcohol and reports he quit drinking easily. Patient reports he will occasionally smoke marijuana if he is feeling stressed out.   Urinary Drug Screen Completed:  Y Alcohol level:   N/A    Environmental/Housing/Living Arrangement  Stable housing   Who is in the home:   Roommates   Emergency contact:  Michelle-caregiver   Financial  Medicaid   Patient's Strengths and Goals (patient's own words):   Patient has supportive caregiver and appears to be fairly independent prior to admission.   Clinical Social Worker's Interpretive Summary:   CSW received referral to complete psychosocial assessment. CSW reviewed chart and met with patient at bedside. CSW introduced myself and explained role.    Patient reports he lives with several roommates but they do not assist him. Patient reports he is rarely at home because he likes to ride his bike to [employer] or go to the park.  Patient reports that his parents have passed away and he has one dtr but is not in contact with her. Patient reports he has 9 siblings but does not have contact with them either. Patient reports he has a friend Marcelino Duster) who helps care for him.    Patient denies any MH concerns and reports that he feels sad at times whenever he is feeling physically ill. Patient reports that he has stopped drinking and is aware that his ulcer has returned. Patient feels he is not addicted to any substances and declines any referrals for treatment.    Patient agreeable to complete mini mental status exam. Patient scored a 23/30. Patient was unable to spell "WORLD" backwards and could only remember two out of the three items during the assessment. Patient also stated it was 2013 instead of 2014.    Patient is aware that he needs to stay in the hospital until he is formally DC to ensure his safety. Patient reports that he is concerned that he is not receiving proper medication and that his ulcer will not heal. Patient is also concerned about follow up treatment once DC from the hospital. Patient is agreeable to stay in the hospital during this admission.    CSW received permission to call and speak with caregiver Marcelino Duster). Caregiver reports that she has known patient for about 15 years and reports that patient has been weak for the past two weeks and deconditioned. Caregiver is worried about patient's wellbeing and states that patient has been forgetting things and repeating questions over the past few days. Caregiver assisted patient with applying for disability and reports he saw a psychiatrist for an evaluation but that she is unsure if he was given a diagnosis and was not prescribed any medication. Patient has told her that  RN was trying to place him on a spaceship and that a man commanded him to be angry and throw a desk. Caregiver reports no further knowledge of any MH concerns or hospitalizations.    CSW will  continue to follow and will assist with any recommendations provided by psych MD.   Disposition:  Recommend Psych CSW continuing to support while in hospital

## 2013-05-01 NOTE — Plan of Care (Signed)
Problem: Phase II Progression Outcomes Goal: Tolerating diet Outcome: Progressing Tolerating clear liquids     

## 2013-05-01 NOTE — Progress Notes (Signed)
CARE MANAGEMENT NOTE 05/01/2013  Patient:  [name], [name]   Account Number:  [id number]  Date Initiated:  05/01/2013  Documentation initiated by:  DAVIS,RHONDA  Subjective/Objective Assessment:   pt with hx of etoh abuse now with hgb of 2.7-5.7 and active gi bld     Action/Plan:   from home   Anticipated DC Date:  05/03/2013   Anticipated DC Plan:  HOME/SELF CARE         Choice offered to / List presented to:             Status of service:  In process, will continue to follow Medicare Important Message given?  NA - LOS <3 / Initial given by admissions (If response is "NO", the following Medicare IM given date fields will be blank) Date Medicare IM given:   Date Additional Medicare IM given:    Discharge Disposition:    Per UR Regulation:  Reviewed for med. necessity/level of care/duration of stay  If discussed at Long Length of Stay Meetings, dates discussed:    Comments:  [id number]/Rhonda Earlene Plater RN, BSN, CCN: [phone] Case management. Chart reviewed for discharge planning and present needs. Discharge needs: none present at time of review. Next chart review due:  [id number]

## 2013-05-01 NOTE — Progress Notes (Addendum)
INITIAL NUTRITION ASSESSMENT  DOCUMENTATION CODES Per approved criteria  -Moderate malnutrition in the context of social/environmental circumstances   INTERVENTION: Further diet advancement per MD discretion. Add Resource Breeze po BID, each supplement provides 250 kcal and 9 grams of protein. RD to continue to follow nutrition care plan.  NUTRITION DIAGNOSIS: Inadequate oral intake related to GI distress AEB need for slow diet progression.  Goal: Advance diet as tolerated to regular diet; intake to meet at least 90% of estimated nutrition needs.  Monitor:  Weights, labs, diet advancement  Reason for Assessment: Nutrition risk   52 y.o. male  Admitting Dx: GIB (gastrointestinal bleeding)  ASSESSMENT: PMHx of bleeding duodenal ulcer and alcohol abuse (presently drinks approximately 2 x 40 oz of Olde English Beer daily.) Admitted on 9/25 with anemia and possible GIB, however pt left AMA on 9/29.  Admitted currently with GIB and anemia.  Evaluated by GI on admission. Underwent endoscopy this morning, pt found to have postbulbar duodenal ulcer. Currently on a Clear Liquid diet.  Per chart review, CSW spoke with patient's caregiver earlier today, who reports that pt has been deconditioned and weak x 2 weeks. Pt is now forgetting things and repeating questions when talking. Pt reports to this RD that he hopes he can leave by tomorrow so he can get to the food bank and pick up some food; states that is where he gets a lot of his food. Pt with moderate temporal wasting. Pt states that he has lost 30 lb since January, however he has lost 9 lb. This is not significant for time frame, however pt notes that his oral intake is poor and often goes 1-2 days without eating.  Pt meets criteria for moderate MALNUTRITION in the context of social-environmental circumstances as evidenced by moderate muscle mass loss and suspected intake of <75% x at least 3 months.   Most recent magnesium, phosphorus  and potassium WNL.   Height: Ht Readings from Last 1 Encounters:  04/30/13 5\' 9"  (1.753 m)    Weight: Wt Readings from Last 1 Encounters:  05/01/13 164 lb 7.4 oz (74.6 kg)    Ideal Body Weight: 160 lb   % Ideal Body Weight: 102%  Wt Readings from Last 10 Encounters:  05/01/13 164 lb 7.4 oz (74.6 kg)  05/01/13 164 lb 7.4 oz (74.6 kg)  04/27/13 163 lb 9.3 oz (74.2 kg)  03/25/13 165 lb (74.844 kg)  12/23/12 163 lb (73.936 kg)  08/22/12 148 lb 9.6 oz (67.405 kg)  08/12/12 173 lb 4.5 oz (78.6 kg)  08/12/12 173 lb 4.5 oz (78.6 kg)  08/12/12 173 lb 4.5 oz (78.6 kg)  08/12/12 173 lb 4.5 oz (78.6 kg)    Usual Body Weight: 173 lb in January 2014  % Usual Body Weight: 95%  BMI:  Body mass index is 24.28 kg/(m^2). WNL  Estimated Nutritional Needs: Kcal: 1800-2000 Protein: 75-90g Fluid: 1.8-2L/day  Skin: Intact   Diet Order: Clear Liquid  EDUCATION NEEDS: -No education needs identified at this time   Intake/Output Summary (Last 24 hours) at 05/01/13 1347 Last data filed at 05/01/13 1300  Gross per 24 hour  Intake 4918.17 ml  Output   2665 ml  Net 2253.17 ml    Last BM: 10/1  Labs:   Recent Labs Lab 04/28/13 0712 04/30/13 1640 04/30/13 2034 05/01/13 0635  NA 144 139  --  140  K 3.7 3.6  --  3.9  CL 113* 107  --  110  CO2 24 24  --  24  BUN 28* 24*  --  20  CREATININE 1.23 0.97  --  1.06  CALCIUM 7.9* 8.1*  --  7.7*  MG  --   --  2.0  --   PHOS  --   --  2.4  --   GLUCOSE 91 95  --  94    CBG (last 3)  No results found for this basename: GLUCAP,  in the last 72 hours  Scheduled Meds: . antiseptic oral rinse  15 mL Mouth Rinse BID  . folic acid  1 mg Oral Daily  . LORazepam  0-4 mg Intravenous Q6H   Followed by  . [START ON 05/02/2013] LORazepam  0-4 mg Intravenous Q12H  . multivitamin with minerals  1 tablet Oral Daily  . [START ON 05/04/2013] pantoprazole (PROTONIX) IV  40 mg Intravenous Q12H  . sodium chloride  1,000 mL Intravenous Once  .  sodium chloride  3 mL Intravenous Q12H  . sucralfate  1 g Oral TID WC & HS  . thiamine  100 mg Oral Daily   Or  . thiamine  100 mg Intravenous Daily    Continuous Infusions: . sodium chloride 125 mL/hr at 05/01/13 0445  . sodium chloride 20 mL/hr at 05/01/13 0906  . sodium chloride 20 mL/hr at 05/01/13 0906  . pantoprozole (PROTONIX) infusion 8 mg/hr (05/01/13 0600)    Past Medical History  Diagnosis Date  . Abdominal  pain, other specified site   . GERD (gastroesophageal reflux disease)   . Abnormal CT scan, esophagus 03/2012  . Substance abuse 03/2012    tox screen positive for cocaine.     Past Surgical History  Procedure Laterality Date  . Undescended testicle      on right. noted on 03/2012 CT scan.  no surgery referral.   . Esophagogastroduodenoscopy  08/10/2012    Procedure: ESOPHAGOGASTRODUODENOSCOPY (EGD);  Surgeon: [name], MD;  Location: [facility] ENDOSCOPY;  Service: Endoscopy;  Laterality: N/A;  will be done in room 1   . Laparotomy  08/10/2012    Procedure: EXPLORATORY LAPAROTOMY;  Surgeon: [name], MD;  Location: [facility] OR;  Service: General;  Laterality: N/A;    [name] MS, RD, LDN Pager: [phone] After-hours pager: [phone]

## 2013-05-01 NOTE — Consult Note (Addendum)
[facility] Face-to-Face Psychiatry Consult   Reason for Consult:  Capacity Referring Physician:  Dr Jacquelynn Cree is an 53 y.o. male.  Assessment: AXIS I:  Psychotic Disorder NOS and And r/o psychosis due to general medical condition AXIS II:  Deferred AXIS III:   Past Medical History  Diagnosis Date  . Abdominal  pain, other specified site   . GERD (gastroesophageal reflux disease)   . Abnormal CT scan, esophagus 03/2012  . Substance abuse 03/2012    tox screen positive for cocaine.    AXIS IV:  other psychosocial or environmental problems, problems with access to health care services and problems with primary support group AXIS V:  31-40 impairment in reality testing  Plan:  No evidence of imminent risk to self or others at present.   Supportive therapy provided about ongoing stressors.  Subjective:   [name] is a 52 y.o. male patient admitted with acute blood loss.  HPI:  Patient is 52 year old African American man who was admitted on the medical floor because of anemia, blood loss and low hemoglobin level.  He signed out AMA yesterday and refuse blood transfusion however came back again with extreme fatigue, and lethargy.  He initially refused blood transfusion however finally agreed yesterday and given transfusion.  He remains very irritable and labile and difficult to have conversation.  He was uncooperative and start cursing and yelling in the room reporting that people are playing games with him.  He admitted not sleeping well and denies any suicidal thoughts or homicidal thoughts, he was explain that without blood transfusions and appropriate treatment his life is in danger he started cursing and yelling.  He reported he does not care if he lived or died and feels that he is harassed by the hospital.  Patient did not report any previous history of psychiatric treatment or any suicidal intent.  He denies any hallucination but appears paranoid, vigilant and labile.  Writer has  to leave the room because he was cursing and yelling and did not cooperate with the interview. HPI Elements:   Location:  Medical floor . Quality:  Poor. Severity:  Moderate. Timing:  Ongoing.  Past Psychiatric History: Past Medical History  Diagnosis Date  . Abdominal  pain, other specified site   . GERD (gastroesophageal reflux disease)   . Abnormal CT scan, esophagus 03/2012  . Substance abuse 03/2012    tox screen positive for cocaine.     reports that he has been smoking.  He has never used smokeless tobacco. He reports that  drinks alcohol. He reports that he uses illicit drugs (Marijuana). History reviewed. No pertinent family history.   Living Arrangements: Other relatives;Non-relatives/Friends   Abuse/Neglect [facility]) Physical Abuse: Denies Verbal Abuse: Denies Sexual Abuse: Denies Allergies:   Allergies  Allergen Reactions  . Chocolate Nausea And Vomiting  . Peanut-Containing Drug Products Nausea And Vomiting  . Spinach Nausea And Vomiting    ACT Assessment Complete:  No:   Past Psychiatric History: Diagnosis:  Unknown   Hospitalizations:  Unknown   Outpatient Care:  Unknown   Substance Abuse Care:  Unknown   Self-Mutilation:  Unknown   Suicidal Attempts:  Unknown   Homicidal Behaviors:  Unknown    Violent Behaviors:  Unknown    Place of Residence:  Refused to cooperate Marital Status:  Refused to cooperate Employed/Unemployed:  Refused to cooperate Education:  Refused to cooperate Family Supports:  Refused to cooperate Objective: Blood pressure 120/62, pulse 83, temperature 97.5 F (36.4  C), temperature source Oral, resp. rate 22, height 5\' 9"  (1.753 m), weight 164 lb 7.4 oz (74.6 kg), SpO2 99.00%.Body mass index is 24.28 kg/(m^2). Results for orders placed during the hospital encounter of 04/30/13 (from the past 72 hour(s))  CBC WITH DIFFERENTIAL     Status: Abnormal   Collection Time    04/30/13  4:40 PM      Result Value Range   WBC 11.8 (*) 4.0 - 10.5  K/uL   Comment: ADJUSTED FOR NUCLEATED RBC'S   RBC 2.11 (*) 4.22 - 5.81 MIL/uL   Hemoglobin 5.7 (*) 13.0 - 17.0 g/dL   Comment: RESULT REPEATED AND VERIFIED     CRITICAL RESULT CALLED TO, READ BACK BY AND VERIFIED WITH:     HAMILTON,J. AT 1706 ON 09.30.14 BY LOVE,T.   HCT 16.8 (*) 39.0 - 52.0 %   MCV 79.6  78.0 - 100.0 fL   MCH 27.0  26.0 - 34.0 pg   MCHC 33.9  30.0 - 36.0 g/dL   RDW 16.1 (*) 09.6 - 04.5 %   Platelets 167  150 - 400 K/uL   Neutrophils Relative % 73  43 - 77 %   Lymphocytes Relative 18  12 - 46 %   Monocytes Relative 8  3 - 12 %   Eosinophils Relative 1  0 - 5 %   Basophils Relative 0  0 - 1 %   Neutro Abs 8.7 (*) 1.7 - 7.7 K/uL   Lymphs Abs 2.1  0.7 - 4.0 K/uL   Monocytes Absolute 0.9  0.1 - 1.0 K/uL   Eosinophils Absolute 0.1  0.0 - 0.7 K/uL   Basophils Absolute 0.0  0.0 - 0.1 K/uL   Smear Review MORPHOLOGY UNREMARKABLE    BASIC METABOLIC PANEL     Status: Abnormal   Collection Time    04/30/13  4:40 PM      Result Value Range   Sodium 139  135 - 145 mEq/L   Potassium 3.6  3.5 - 5.1 mEq/L   Chloride 107  96 - 112 mEq/L   CO2 24  19 - 32 mEq/L   Glucose, Bld 95  70 - 99 mg/dL   BUN 24 (*) 6 - 23 mg/dL   Creatinine, Ser 4.09  0.50 - 1.35 mg/dL   Calcium 8.1 (*) 8.4 - 10.5 mg/dL   GFR calc non Af Amer >90  >90 mL/min   GFR calc Af Amer >90  >90 mL/min   Comment: (NOTE)     The eGFR has been calculated using the CKD EPI equation.     This calculation has not been validated in all clinical situations.     eGFR's persistently <90 mL/min signify possible Chronic Kidney     Disease.  TYPE AND SCREEN     Status: None   Collection Time    04/30/13  4:40 PM      Result Value Range   ABO/RH(D) O NEG     Antibody Screen POS     Sample Expiration 05/03/2013     DAT, IgG NEG     Antibody Identification ANTI-D     Unit Number [id number]     Blood Component Type RED CELLS,LR     Unit division 00     Status of Unit ISSUED,FINAL     Transfusion Status OK TO  TRANSFUSE     Crossmatch Result COMPATIBLE     Unit Number [id number]     Blood Component Type RED CELLS,LR  Unit division 00     Status of Unit ISSUED     Transfusion Status OK TO TRANSFUSE     Crossmatch Result COMPATIBLE     Unit Number [id number]     Blood Component Type RED CELLS,LR     Unit division 00     Status of Unit ISSUED,FINAL     Transfusion Status OK TO TRANSFUSE     Crossmatch Result COMPATIBLE     Unit Number [id number]     Blood Component Type RBC LR PHER2     Unit division 00     Status of Unit ISSUED     Transfusion Status OK TO TRANSFUSE     Crossmatch Result COMPATIBLE    PREPARE RBC (CROSSMATCH)     Status: None   Collection Time    04/30/13  5:22 PM      Result Value Range   Order Confirmation ORDER PROCESSED BY BLOOD BANK    MRSA PCR SCREENING     Status: None   Collection Time    04/30/13  6:42 PM      Result Value Range   MRSA by PCR NEGATIVE  NEGATIVE   Comment:            The GeneXpert MRSA Assay (FDA     approved for NASAL specimens     only), is one component of a     comprehensive MRSA colonization     surveillance program. It is not     intended to diagnose MRSA     infection nor to guide or     monitor treatment for     MRSA infections.     Performed at [facility]  AMMONIA     Status: None   Collection Time    04/30/13  6:51 PM      Result Value Range   Ammonia 23  11 - 60 umol/L  PREPARE RBC (CROSSMATCH)     Status: None   Collection Time    04/30/13  7:00 PM      Result Value Range   Order Confirmation ORDER PROCESSED BY BLOOD BANK    TSH     Status: None   Collection Time    04/30/13  8:33 PM      Result Value Range   TSH 0.604  0.350 - 4.500 uIU/mL   Comment: Performed at [facility]  MAGNESIUM     Status: None   Collection Time    04/30/13  8:34 PM      Result Value Range   Magnesium 2.0  1.5 - 2.5 mg/dL  PHOSPHORUS     Status: None   Collection Time    04/30/13  8:34 PM      Result  Value Range   Phosphorus 2.4  2.3 - 4.6 mg/dL  APTT     Status: None   Collection Time    04/30/13  8:34 PM      Result Value Range   aPTT 29  24 - 37 seconds  PROTIME-INR     Status: None   Collection Time    04/30/13  8:34 PM      Result Value Range   Prothrombin Time 13.6  11.6 - 15.2 seconds   INR 1.06  0.00 - 1.49  CBC     Status: Abnormal   Collection Time    04/30/13  8:34 PM      Result Value Range   WBC 10.4  4.0 -  10.5 K/uL   RBC 1.99 (*) 4.22 - 5.81 MIL/uL   Hemoglobin 5.3 (*) 13.0 - 17.0 g/dL   Comment: CRITICAL VALUE NOTED.  VALUE IS CONSISTENT WITH PREVIOUSLY REPORTED AND CALLED VALUE.     REPEATED TO VERIFY   HCT 15.9 (*) 39.0 - 52.0 %   MCV 79.9  78.0 - 100.0 fL   MCH 26.6  26.0 - 34.0 pg   MCHC 33.3  30.0 - 36.0 g/dL   RDW 16.1 (*) 09.6 - 04.5 %   Platelets 153  150 - 400 K/uL  VITAMIN B12     Status: None   Collection Time    04/30/13  8:34 PM      Result Value Range   Vitamin B-12 502  211 - 911 pg/mL   Comment: Performed at [facility]  FOLATE     Status: None   Collection Time    04/30/13  8:34 PM      Result Value Range   Folate 10.2     Comment: (NOTE)     Reference Ranges            Deficient:       0.4 - 3.3 ng/mL            Indeterminate:   3.4 - 5.4 ng/mL            Normal:              > 5.4 ng/mL     Performed at [facility]  IRON AND TIBC     Status: Abnormal   Collection Time    04/30/13  8:34 PM      Result Value Range   Iron <10 (*) 42 - 135 ug/dL   TIBC Not calculated due to Iron <10.  215 - 435 ug/dL   Saturation Ratios Not calculated due to Iron <10.  20 - 55 %   UIBC 255  125 - 400 ug/dL   Comment: Performed at [facility]  FERRITIN     Status: Abnormal   Collection Time    04/30/13  8:34 PM      Result Value Range   Ferritin 20 (*) 22 - 322 ng/mL   Comment: Performed at [facility]  RPR     Status: None   Collection Time    04/30/13  8:34 PM      Result Value Range   RPR NON  REACTIVE  NON REACTIVE   Comment: Performed at [facility]  URINE RAPID DRUG SCREEN (HOSP PERFORMED)     Status: None   Collection Time    04/30/13  9:02 PM      Result Value Range   Opiates NONE DETECTED  NONE DETECTED   Cocaine NONE DETECTED  NONE DETECTED   Benzodiazepines NONE DETECTED  NONE DETECTED   Amphetamines NONE DETECTED  NONE DETECTED   Tetrahydrocannabinol NONE DETECTED  NONE DETECTED   Barbiturates NONE DETECTED  NONE DETECTED   Comment:            DRUG SCREEN FOR MEDICAL PURPOSES     ONLY.  IF CONFIRMATION IS NEEDED     FOR ANY PURPOSE, NOTIFY LAB     WITHIN 5 DAYS.                LOWEST DETECTABLE LIMITS     FOR URINE DRUG SCREEN     Drug Class       Cutoff (ng/mL)  Amphetamine      1000     Barbiturate      200     Benzodiazepine   200     Tricyclics       300     Opiates          300     Cocaine          300     THC              50  URINALYSIS, ROUTINE W REFLEX MICROSCOPIC     Status: None   Collection Time    04/30/13  9:02 PM      Result Value Range   Color, Urine YELLOW  YELLOW   APPearance CLEAR  CLEAR   Specific Gravity, Urine 1.017  1.005 - 1.030   pH 5.0  5.0 - 8.0   Glucose, UA NEGATIVE  NEGATIVE mg/dL   Hgb urine dipstick NEGATIVE  NEGATIVE   Bilirubin Urine NEGATIVE  NEGATIVE   Ketones, ur NEGATIVE  NEGATIVE mg/dL   Protein, ur NEGATIVE  NEGATIVE mg/dL   Urobilinogen, UA 0.2  0.0 - 1.0 mg/dL   Nitrite NEGATIVE  NEGATIVE   Leukocytes, UA NEGATIVE  NEGATIVE   Comment: MICROSCOPIC NOT DONE ON URINES WITH NEGATIVE PROTEIN, BLOOD, LEUKOCYTES, NITRITE, OR GLUCOSE <1000 mg/dL.  CBC     Status: Abnormal   Collection Time    05/01/13  6:35 AM      Result Value Range   WBC 10.3  4.0 - 10.5 K/uL   RBC 3.23 (*) 4.22 - 5.81 MIL/uL   Hemoglobin 8.8 (*) 13.0 - 17.0 g/dL   Comment: DELTA CHECK NOTED     REPEATED TO VERIFY     POST TRANSFUSION SPECIMEN   HCT 26.1 (*) 39.0 - 52.0 %   MCV 80.8  78.0 - 100.0 fL   MCH 27.2  26.0 - 34.0 pg    MCHC 33.7  30.0 - 36.0 g/dL   RDW 96.0  45.4 - 09.8 %   Platelets 123 (*) 150 - 400 K/uL  COMPREHENSIVE METABOLIC PANEL     Status: Abnormal   Collection Time    05/01/13  6:35 AM      Result Value Range   Sodium 140  135 - 145 mEq/L   Potassium 3.9  3.5 - 5.1 mEq/L   Chloride 110  96 - 112 mEq/L   CO2 24  19 - 32 mEq/L   Glucose, Bld 94  70 - 99 mg/dL   BUN 20  6 - 23 mg/dL   Creatinine, Ser 1.19  0.50 - 1.35 mg/dL   Calcium 7.7 (*) 8.4 - 10.5 mg/dL   Total Protein 4.3 (*) 6.0 - 8.3 g/dL   Albumin 1.8 (*) 3.5 - 5.2 g/dL   AST 17  0 - 37 U/L   ALT 14  0 - 53 U/L   Alkaline Phosphatase 37 (*) 39 - 117 U/L   Total Bilirubin 0.3  0.3 - 1.2 mg/dL   GFR calc non Af Amer 79 (*) >90 mL/min   GFR calc Af Amer >90  >90 mL/min   Comment: (NOTE)     The eGFR has been calculated using the CKD EPI equation.     This calculation has not been validated in all clinical situations.     eGFR's persistently <90 mL/min signify possible Chronic Kidney     Disease.  PROTIME-INR     Status: None   Collection Time  05/01/13  6:35 AM      Result Value Range   Prothrombin Time 13.4  11.6 - 15.2 seconds   INR 1.04  0.00 - 1.49  HEMOGLOBIN AND HEMATOCRIT, BLOOD     Status: Abnormal   Collection Time    05/01/13 12:30 PM      Result Value Range   Hemoglobin 8.7 (*) 13.0 - 17.0 g/dL   HCT 04.5 (*) 40.9 - 81.1 %   Labs are reviewed and are pertinent for low hemoglobin.  Current Facility-Administered Medications  Medication Dose Route Frequency Provider Last Rate Last Dose  . 0.9 %  sodium chloride infusion   Intravenous Continuous Rodolph Bong, MD 125 mL/hr at 05/01/13 0445    . 0.9 %  sodium chloride infusion   Intravenous Continuous [name], MD 20 mL/hr at 05/01/13 0906    . 0.9 %  sodium chloride infusion   Intravenous Continuous [name], MD 20 mL/hr at 05/01/13 0906    . antiseptic oral rinse (BIOTENE) solution 15 mL  15 mL Mouth Rinse BID Rodolph Bong, MD   15 mL at  04/30/13 2249  . feeding supplement (RESOURCE BREEZE) liquid 1 Container  1 Container Oral BID BM Haynes Bast, RD   1 Container at 05/01/13 1725  . folic acid (FOLVITE) tablet 1 mg  1 mg Oral Daily Rodolph Bong, MD   1 mg at 05/01/13 0940  . LORazepam (ATIVAN) injection 0-4 mg  0-4 mg Intravenous Q6H Rodolph Bong, MD   2 mg at 05/01/13 0057   Followed by  . [START ON 05/02/2013] LORazepam (ATIVAN) injection 0-4 mg  0-4 mg Intravenous Q12H Rodolph Bong, MD      . LORazepam (ATIVAN) tablet 1 mg  1 mg Oral Q6H PRN Rodolph Bong, MD       Or  . LORazepam (ATIVAN) injection 1 mg  1 mg Intravenous Q6H PRN Rodolph Bong, MD      . multivitamin with minerals tablet 1 tablet  1 tablet Oral Daily Rodolph Bong, MD   1 tablet at 05/01/13 0940  . ondansetron (ZOFRAN) tablet 4 mg  4 mg Oral Q6H PRN Rodolph Bong, MD       Or  . ondansetron [facility]) injection 4 mg  4 mg Intravenous Q6H PRN Rodolph Bong, MD      . oxyCODONE (Oxy IR/ROXICODONE) immediate release tablet 5 mg  5 mg Oral Q4H PRN Rodolph Bong, MD      . pantoprazole (PROTONIX) 80 mg in sodium chloride 0.9 % 250 mL infusion  8 mg/hr Intravenous Continuous Rodolph Bong, MD 25 mL/hr at 05/01/13 0600 8 mg/hr at 05/01/13 0600  . [START ON 05/04/2013] pantoprazole (PROTONIX) injection 40 mg  40 mg Intravenous Q12H Rodolph Bong, MD      . polyethylene glycol (MIRALAX / GLYCOLAX) packet 17 g  17 g Oral Daily PRN Rodolph Bong, MD      . sodium chloride 0.9 % bolus 1,000 mL  1,000 mL Intravenous Once Rodolph Bong, MD      . sodium chloride 0.9 % injection 3 mL  3 mL Intravenous Q12H Rodolph Bong, MD   3 mL at 05/01/13 0906  . sorbitol 70 % solution 30 mL  30 mL Oral Daily PRN Rodolph Bong, MD      . sucralfate (CARAFATE) 1 GM/10ML suspension 1 g  1 g Oral TID WC & HS  Rhetta Mura, MD   1 g at 05/01/13 1220  . thiamine (VITAMIN B-1) tablet 100 mg  100 mg Oral Daily Rodolph Bong, MD   100 mg at 05/01/13 0940   Or  . thiamine (B-1) injection 100 mg  100 mg Intravenous Daily Rodolph Bong, MD        Psychiatric Specialty Exam:     Blood pressure 120/62, pulse 83, temperature 97.5 F (36.4 C), temperature source Oral, resp. rate 22, height 5\' 9"  (1.753 m), weight 164 lb 7.4 oz (74.6 kg), SpO2 99.00%.Body mass index is 24.28 kg/(m^2).  General Appearance: Disheveled and Guarded  Eye Contact::  Poor  Speech:  Pressured  Volume:  Increased  Mood:  Angry and Irritable  Affect:  Labile  Thought Process:  Disorganized and Loose  Orientation:  Other:  Refused to cooperate  Thought Content:  Paranoid Ideation  Suicidal Thoughts:  No  Homicidal Thoughts:  No  Memory:  Difficulty remembering things  Judgement:  Poor  Insight:  Lacking  Psychomotor Activity:  Increased  Concentration:  Poor  Recall:  Poor  Akathisia:  No  Handed:  Right  AIMS (if indicated):     Assets:  Housing  Sleep:      Treatment Plan Summary: Patient does not have capacity at this time to participate in his treatment plan.  He does not understand the extent of physical illness.  He is uncooperative in the interview.  He lacks the insight and judgment and severity of his medical illness.  Consider Ativan 1 mg IM for severe agitation .    [name],Ryan T. 05/01/2013 5:27 PM

## 2013-05-01 NOTE — Progress Notes (Addendum)
[name]:[id number] DOB: 10-Oct-1960 DOA: 04/30/2013 PCP: [name], MD  Brief narrative: 52 year old ? admitted 9/292014 with severe venous, syncope after leaving AGAINST MEDICAL ADVICE 04/25/2013 [severe microcytic anemia anemia, hemoglobin 2.7, acute encephalopathy -transfused 6 units total of packed red blood cells]  transfused 4 units packed red blood cells placed on protonic drip, assessed by Dr. [name] and endoscopy showed   Past medical history-As per Problem list Chart reviewed as below-   Consultants:  Dr. Mann-gastroenterology  Procedures: Endoscopy per Dr. 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 6 cc's of 1:10, 000 epinephrine was injected around the ulcer base and hemostasis was achieved. The small bowel distal to the ulcer upto 60 cm appeared normal  Antibiotics:   None currently   Subjective   sleepy, does not wish we could examine his abdomen   Objective    Interim History:  Reviewed  Telemetry:  Sinus rhythm  Objective: Filed Vitals:   05/01/13 0800 05/01/13 0805 05/01/13 0810 05/01/13 0820  BP: 127/68 127/68 126/73 126/73  Pulse:      Temp:      TempSrc:      Resp: 11 20 11    Height:      Weight:      SpO2: 100% 100% 100% 95%    Intake/Output Summary (Last 24 hours) at 05/01/13 0907 Last data filed at 05/01/13 0600  Gross per 24 hour  Intake 3649.67 ml  Output   2015 ml  Net 1634.67 ml    Exam:  General:  EOMI, NCAT Cardiovascular:  S1-S2 no murmur rub or gallop Respiratory:  Clinically clear Abdomen:  Could not complete exam Skin could not complete exam Neuro could not complete exam  Data Reviewed: Basic Metabolic Panel:  Recent Labs Lab 04/26/13 0835 04/27/13 0405 04/28/13 0712 04/30/13 1640 04/30/13 2034 05/01/13 0635  NA 145 141 144 139  --  140  K 3.9 4.0 3.7 3.6  --  3.9  CL 114* 111 113* 107  --  110  CO2 24 22 24 24   --  24  GLUCOSE 95 87 91 95  --  94    BUN 25* 22 28* 24*  --  20  CREATININE 1.36* 1.30 1.23 0.97  --  1.06  CALCIUM 7.7* 7.8* 7.9* 8.1*  --  7.7*  MG  --   --   --   --  2.0  --   PHOS  --   --   --   --  2.4  --    Liver Function Tests:  Recent Labs Lab 04/25/13 1458 04/26/13 0835 05/01/13 0635  AST 17 27 17   ALT 11 11 14   ALKPHOS 34* 31* 37*  BILITOT 0.1* 0.4 0.3  PROT 5.0* 4.4* 4.3*  ALBUMIN 2.5* 2.1* 1.8*    Recent Labs Lab 04/25/13 1410  LIPASE 27    Recent Labs Lab 04/25/13 1458 04/30/13 1851  AMMONIA 10* 23   CBC:  Recent Labs Lab 04/25/13 1458  04/28/13 0712 04/29/13 0505 04/30/13 1640 04/30/13 2034 05/01/13 0635  WBC 20.7*  < > 10.4 9.0 11.8* 10.4 10.3  NEUTROABS 16.8*  --   --   --  8.7*  --   --   HGB 2.7*  < > 7.5* 7.0* 5.7* 5.3* 8.8*  HCT 8.9*  < > 22.2* 20.3* 16.8* 15.9* 26.1*  MCV 76.1*  < > 82.5 80.9 79.6 79.9 80.8  PLT 233  < > 166 154 167 153  123*  < > = values in this interval not displayed. Cardiac Enzymes:  Recent Labs Lab 04/25/13 1458  TROPONINI <0.30   BNP: No components found with this basename: POCBNP,  CBG: No results found for this basename: GLUCAP,  in the last 168 hours  Recent Results (from the past 240 hour(s))  MRSA PCR SCREENING     Status: None   Collection Time    04/25/13  6:28 PM      Result Value Range Status   MRSA by PCR NEGATIVE  NEGATIVE Final   Comment:            The GeneXpert MRSA Assay (FDA     approved for NASAL specimens     only), is one component of a     comprehensive MRSA colonization     surveillance program. It is not     intended to diagnose MRSA     infection nor to guide or     monitor treatment for     MRSA infections.  MRSA PCR SCREENING     Status: None   Collection Time    04/30/13  6:42 PM      Result Value Range Status   MRSA by PCR NEGATIVE  NEGATIVE Final   Comment:            The GeneXpert MRSA Assay (FDA     approved for NASAL specimens     only), is one component of a     comprehensive MRSA  colonization     surveillance program. It is not     intended to diagnose MRSA     infection nor to guide or     monitor treatment for     MRSA infections.     Performed at [facility]     Studies:              All Imaging reviewed and is as per above notation   Scheduled Meds: . antiseptic oral rinse  15 mL Mouth Rinse BID  . folic acid  1 mg Oral Daily  . LORazepam  0-4 mg Intravenous Q6H   Followed by  . [START ON 05/02/2013] LORazepam  0-4 mg Intravenous Q12H  . multivitamin with minerals  1 tablet Oral Daily  . [START ON 05/04/2013] pantoprazole (PROTONIX) IV  40 mg Intravenous Q12H  . sodium chloride  1,000 mL Intravenous Once  . sodium chloride  3 mL Intravenous Q12H  . thiamine  100 mg Oral Daily   Or  . thiamine  100 mg Intravenous Daily   Continuous Infusions: . sodium chloride 125 mL/hr at 05/01/13 0445  . sodium chloride 20 mL/hr at 05/01/13 0906  . sodium chloride 20 mL/hr at 05/01/13 0906  . pantoprozole (PROTONIX) infusion 8 mg/hr (05/01/13 0600)     Assessment/Plan: 1.  Acute severe upper GI bleed with bleeding ulcer-status post epinephrine injection 05/01/13-known history of duodenal ulcer being oversewn January 2014- high risk fo further complication-we'll alert general surgery as an FYI-appreciate gastroenterology input.  Continue Protonix GTT, added Carafate 1 g 3 times a day with meals, per GI can have clear liquids.  Continue IV fluids 125 cc per hour.  Hemoglobin every 12.  Would score him as a Rockall stage 2 2. History of alcohol habituation-CIWA score 6 earlier this morning, now 3-drinks 2 40s daily Olde English beer. Once patient is more alert we'll encourage cessation-continue Ativan protocol.  Discontinued Tylenol. 3. Acute kidney injury-potentially secondary to effect  of bleeding into the duodenum-continue IV saline as above, basic metabolic panel a.m. 4. Moderate to severe malnutrition-albumin is low-nutritionist input appreciated once patient  can take a full diet 5. Tob habituation-will offer patch  Code Status:  Full Family Communication:  Updated 05/01/13-his healthcare power of attorney is Jobe Igo [phone] Disposition Plan: SDU-have alerted Gen surgery in case intervention is required.  Pleas Koch, MD  [facility] Pager [phone] 05/01/2013, 9:07 AM    LOS: 1 day

## 2013-05-01 NOTE — ED Provider Notes (Addendum)
CSN: [id number]     Arrival date & time 04/30/13  1504 History   First MD Initiated Contact with Patient 04/30/13 1546     Chief Complaint  Patient presents with  . Shortness of Breath  . Dizziness   (Consider location/radiation/quality/duration/timing/severity/associated sxs/prior Treatment) HPI  This is a 52 year old male who presents with syncope. On my initial evaluation, the patient would not participate in history taking. When asked why he was here he states "I don't know." Chart review shows that the patient left AMA yesterday after being admitted for a GI bleed, anemia. Patient received multiple units of blood. He was scheduled for an endoscopy but left prior to this evaluation.  I requested family to come to the bedside. Family member states the patient has had recurrent syncope at home. They continued to endorse dark stools. Again the patient will not confirm or deny these complaints. He states "I don't hurt anywhere."  He is only oriented to place and self. He will answer yes and no questions and denies any fevers, headache, chest pain, shortness of breath, abdominal pain, urinary symptoms, focal weakness or numbness.  Past Medical History  Diagnosis Date  . Abdominal  pain, other specified site   . GERD (gastroesophageal reflux disease)   . Abnormal CT scan, esophagus 03/2012  . Substance abuse 03/2012    tox screen positive for cocaine.    Past Surgical History  Procedure Laterality Date  . Undescended testicle      on right. noted on 03/2012 CT scan.  no surgery referral.   . Esophagogastroduodenoscopy  08/10/2012    Procedure: ESOPHAGOGASTRODUODENOSCOPY (EGD);  Surgeon: [name], MD;  Location: [facility] ENDOSCOPY;  Service: Endoscopy;  Laterality: N/A;  will be done in room 1   . Laparotomy  08/10/2012    Procedure: EXPLORATORY LAPAROTOMY;  Surgeon: [name], MD;  Location[facility];  Service: General;  Laterality: N/A;   History reviewed. No pertinent family  history. History  Substance Use Topics  . Smoking status: Current Every Day Smoker -- 1.00 packs/day for 34 years  . Smokeless tobacco: Never Used  . Alcohol Use: Yes     Comment: 40oz beer/day    Review of Systems  Constitutional: Negative.  Negative for fever.  Respiratory: Negative.  Negative for shortness of breath.   Cardiovascular: Negative.  Negative for chest pain.  Gastrointestinal: Negative.  Negative for abdominal pain.  Genitourinary: Negative.  Negative for dysuria.  Neurological: Positive for syncope.  Psychiatric/Behavioral: Positive for confusion.  All other systems reviewed and are negative.    Allergies  Chocolate; Peanut-containing drug products; and Spinach  Home Medications  No current outpatient prescriptions on file. BP 121/54  Pulse 103  Temp(Src) 98.8 F (37.1 C) (Oral)  Resp 29  Ht 5\' 9"  (1.753 m)  Wt 167 lb 12.3 oz (76.1 kg)  BMI 24.76 kg/m2  SpO2 98% Physical Exam  Nursing note and vitals reviewed. Constitutional: He appears well-developed and well-nourished. No distress.  HENT:  Head: Normocephalic and atraumatic.  Mucous membranes dry  Eyes: Pupils are equal, round, and reactive to light.  Neck: Neck supple.  Cardiovascular: Regular rhythm and normal heart sounds.   No murmur heard. Sinus tachycardia  Pulmonary/Chest: Effort normal and breath sounds normal. No respiratory distress. He has no wheezes.  Abdominal: Soft. Bowel sounds are normal. There is no tenderness. There is no rebound.  Genitourinary:  Patient refuses rectal exam  Musculoskeletal: He exhibits no edema.  Lymphadenopathy:  He has no cervical adenopathy.  Neurological: He is alert.  Oriented x2  Skin: Skin is warm and dry.  Psychiatric:  Flat affect,    ED Course  Procedures (including critical care time) Labs Review Labs Reviewed  CBC WITH DIFFERENTIAL - Abnormal; Notable for the following:    WBC 11.8 (*)    RBC 2.11 (*)    Hemoglobin 5.7 (*)    HCT  16.8 (*)    RDW 15.6 (*)    Neutro Abs 8.7 (*)    All other components within normal limits  BASIC METABOLIC PANEL - Abnormal; Notable for the following:    BUN 24 (*)    Calcium 8.1 (*)    All other components within normal limits  CBC - Abnormal; Notable for the following:    RBC 1.99 (*)    Hemoglobin 5.3 (*)    HCT 15.9 (*)    RDW 15.7 (*)    All other components within normal limits  MRSA PCR SCREENING  URINE CULTURE  URINE RAPID DRUG SCREEN (HOSP PERFORMED)  AMMONIA  URINALYSIS, ROUTINE W REFLEX MICROSCOPIC  MAGNESIUM  PHOSPHORUS  APTT  PROTIME-INR  CBC  TSH  COMPREHENSIVE METABOLIC PANEL  PROTIME-INR  VITAMIN B12  FOLATE  IRON AND TIBC  FERRITIN  RPR  CBC  CBC  TYPE AND SCREEN  PREPARE RBC (CROSSMATCH)  PREPARE RBC (CROSSMATCH)   Imaging Review Ct Head Wo Contrast  04/30/2013   CLINICAL DATA:  Syncope and weakness.  EXAM: CT HEAD WITHOUT CONTRAST  TECHNIQUE: Contiguous axial images were obtained from the base of the skull through the vertex without intravenous contrast.  COMPARISON:  04/25/2013.  FINDINGS: The ventricles are normal in size and configuration. No extra-axial fluid collections are identified. The gray-white differentiation is normal. No CT findings for acute intracranial process such as hemorrhage or infarction. No mass lesions. The brainstem and cerebellum are grossly normal.  The bony structures are intact. The paranasal sinuses and mastoid air cells are clear. Evidence of prior sinonasal surgery. The globes are intact.  IMPRESSION: No acute intracranial findings or mass lesion.   Electronically Signed   By: Loralie Champagne M.D.   On: 04/30/2013 22:35   Dg Chest Port 1 View  04/30/2013   CLINICAL DATA:  Leukocytosis and weakness.  EXAM: PORTABLE CHEST - 1 VIEW  COMPARISON:  03/23/2013  FINDINGS: The heart size and mediastinal contours are within normal limits. Stable bilateral parenchymal scarring present. There is atelectasis at the right lung base.  The visualized skeletal structures are unremarkable.  IMPRESSION: No focal infiltrate identified. Atelectasis present at the right lung base.   Electronically Signed   By: Irish Lack   On: 04/30/2013 19:16   EKG independently reviewed by myself: Sinus tachycardia with a rate of 100, less than 1 mm ST elevation in V2 and aVF, aVF is unchanged when compared to prior, no arrythmia or interval prolongation noted MDM   1. Anemia   2. GIB (gastrointestinal bleeding)   3. Acute encephalopathy   4. Syncope    This a 52 year old male who presents with continued syncope at home. He recently left AMA without full workup for acute anemia secondary to GI bleed. Patient is nontoxic appearing on exam but is not cooperative. He is only oriented x2. He would not allow [place] to get orthostatics but is mildly tachycardic. Patient's hemoglobin was noted to be 5.7 down from 7 when he left hospital. Patient was given one unit of packed red cells.  At this time I assume patient is continuing to have acute GI blood loss; however, he is refusing rectal exam. Patient needs readmission to the hospital for further workup and evaluation for acute GI bleed. At this time, I question whether the patient has capacity to leave against medical device. While the patient has been uncooperative with exam is in the ER, he has not tried to leave.  It may be prudent to have psychiatric evaluation for capacity.  Shon Baton, MD 05/01/13 1610  Shon Baton, MD 05/01/13 [id number]

## 2013-05-02 ENCOUNTER — Encounter (HOSPITAL_COMMUNITY): Payer: Self-pay | Admitting: Gastroenterology

## 2013-05-02 DIAGNOSIS — K922 Gastrointestinal hemorrhage, unspecified: Secondary | ICD-10-CM

## 2013-05-02 LAB — TYPE AND SCREEN
ABO/RH(D): O NEG
Antibody Screen: POSITIVE
DAT, IgG: NEGATIVE
Unit division: 0
Unit division: 0
Unit division: 0

## 2013-05-02 LAB — HEMOGLOBIN AND HEMATOCRIT, BLOOD
HCT: 26.3 % — ABNORMAL LOW (ref 39.0–52.0)
HCT: 27.8 % — ABNORMAL LOW (ref 39.0–52.0)
Hemoglobin: 9.6 g/dL — ABNORMAL LOW (ref 13.0–17.0)

## 2013-05-02 LAB — URINE CULTURE: Colony Count: 15000

## 2013-05-02 MED ORDER — PANTOPRAZOLE SODIUM 40 MG PO TBEC
40.0000 mg | DELAYED_RELEASE_TABLET | Freq: Two times a day (BID) | ORAL | Status: DC
  Administered 2013-05-02 – 2013-05-05 (×7): 40 mg via ORAL
  Filled 2013-05-02 (×8): qty 1

## 2013-05-02 NOTE — Progress Notes (Signed)
I was not able to evaluate the patient or even have a meaningful conversation with the patient.  He is a very angry person.  When I entered the room he was already berating me verbally.  His disposition makes it very difficult to provide treatment.   I called Surgery today as I feel he requires surgical intervention or IR.  He has a large duodenal ulcer visible vessel and I doubt it will be amenable to medications alone.  Additionally, he is the type of person that will not be compliant with medications.  Currently he reports that he wants to leave the hospital.    Plan: 1) Continue with PPI and sucralfate. 2) Await Surgical consultation.

## 2013-05-02 NOTE — Progress Notes (Signed)
Clinical Social Work  Per chart review, psych MD reports that patient does not have capacity. CSW informed unit CSW of MD's conclusions. Psych CSW is signing off but available if further needs arise.  [place], [place] [phone]

## 2013-05-02 NOTE — Progress Notes (Signed)
Clinical Social Work  CSW spoke with MD who reports he wants patient under IVC in case he attempts to leave the hospital AMA. CSW assisted MD in completing paperwork. CSW faxed forms to Magistrate and called to confirm papers were received. CSW informed RN of IVC and placed forms on chart.  [place], [place] [phone]

## 2013-05-02 NOTE — Progress Notes (Signed)
[name] JXB:[id number] DOB: 1961/04/19 DOA: 04/30/2013 PCP: [name], MD  Brief narrative: 52 year old ? admitted 9/292014 with severe venous, syncope after leaving AGAINST MEDICAL ADVICE 04/25/2013 [severe microcytic anemia anemia, hemoglobin 2.7, acute encephalopathy -transfused 6 units total of packed red blood cells]  transfused 4 units packed red blood cells placed on protonic drip, assessed by Dr. [name] and endoscopy showed active bleeding/oozing near to area of prior oversewed ulcer  Past medical history-As per Problem list Chart reviewed as below-   Consultants:  Dr. Mann-gastroenterology  Dr. Lolly Mustache, Psychiatry  Procedures: Endoscopy per Dr. 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 6 cc's of 1:10, 000 epinephrine was injected around the ulcer base and hemostasis was achieved. The small bowel distal to the ulcer upto 60 cm appeared normal  Antibiotics:   None currently   Subjective  Uncooperative and wisehs to "go home" Gets upset with this examiner and repeatedly raises his voice demanding to know "what's going on" Expalined patientyl x4 that I am not in control of his Ulcer management which didn't seem to appease him Nursing present in room throughout this discussion States stool was "dark" today No n/v  Wants to eat   Objective    Interim History:  Reviewed  Telemetry:  Sinus rhythm  Objective: Filed Vitals:   05/02/13 0400 05/02/13 0430 05/02/13 0800 05/02/13 1208  BP:  129/63 133/68 151/80  Pulse: 79  75 80  Temp:   98.5 F (36.9 C)   TempSrc:   Oral   Resp: 18  24 13   Height:      Weight:      SpO2: 97%  97% 98%    Intake/Output Summary (Last 24 hours) at 05/02/13 1245 Last data filed at 05/02/13 1000  Gross per 24 hour  Intake   2755 ml  Output   6525 ml  Net  -3770 ml    Exam:  NOT EXAMINED  Data Reviewed: Basic Metabolic Panel:  Recent Labs Lab 04/26/13 0835 04/27/13 0405  04/28/13 0712 04/30/13 1640 04/30/13 2034 05/01/13 0635  NA 145 141 144 139  --  140  K 3.9 4.0 3.7 3.6  --  3.9  CL 114* 111 113* 107  --  110  CO2 24 22 24 24   --  24  GLUCOSE 95 87 91 95  --  94  BUN 25* 22 28* 24*  --  20  CREATININE 1.36* 1.30 1.23 0.97  --  1.06  CALCIUM 7.7* 7.8* 7.9* 8.1*  --  7.7*  MG  --   --   --   --  2.0  --   PHOS  --   --   --   --  2.4  --    Liver Function Tests:  Recent Labs Lab 04/25/13 1458 04/26/13 0835 05/01/13 0635  AST 17 27 17   ALT 11 11 14   ALKPHOS 34* 31* 37*  BILITOT 0.1* 0.4 0.3  PROT 5.0* 4.4* 4.3*  ALBUMIN 2.5* 2.1* 1.8*    Recent Labs Lab 04/25/13 1410  LIPASE 27    Recent Labs Lab 04/25/13 1458 04/30/13 1851  AMMONIA 10* 23   CBC:  Recent Labs Lab 04/25/13 1458  04/28/13 0712 04/29/13 0505 04/30/13 1640 04/30/13 2034 05/01/13 0635 05/01/13 1230 05/01/13 2330 05/02/13 1040  WBC 20.7*  < > 10.4 9.0 11.8* 10.4 10.3  --   --   --   NEUTROABS 16.8*  --   --   --  8.7*  --   --   --   --   --   HGB 2.7*  < > 7.5* 7.0* 5.7* 5.3* 8.8* 8.7* 9.1* 9.6*  HCT 8.9*  < > 22.2* 20.3* 16.8* 15.9* 26.1* 25.1* 26.3* 27.8*  MCV 76.1*  < > 82.5 80.9 79.6 79.9 80.8  --   --   --   PLT 233  < > 166 154 167 153 123*  --   --   --   < > = values in this interval not displayed. Cardiac Enzymes:  Recent Labs Lab 04/25/13 1458  TROPONINI <0.30   BNP: No components found with this basename: POCBNP,  CBG: No results found for this basename: GLUCAP,  in the last 168 hours  Recent Results (from the past 240 hour(s))  MRSA PCR SCREENING     Status: None   Collection Time    04/25/13  6:28 PM      Result Value Range Status   MRSA by PCR NEGATIVE  NEGATIVE Final   Comment:            The GeneXpert MRSA Assay (FDA     approved for NASAL specimens     only), is one component of a     comprehensive MRSA colonization     surveillance program. It is not     intended to diagnose MRSA     infection nor to guide or      monitor treatment for     MRSA infections.  MRSA PCR SCREENING     Status: None   Collection Time    04/30/13  6:42 PM      Result Value Range Status   MRSA by PCR NEGATIVE  NEGATIVE Final   Comment:            The GeneXpert MRSA Assay (FDA     approved for NASAL specimens     only), is one component of a     comprehensive MRSA colonization     surveillance program. It is not     intended to diagnose MRSA     infection nor to guide or     monitor treatment for     MRSA infections.     Performed at [facility]  URINE CULTURE     Status: None   Collection Time    04/30/13  9:02 PM      Result Value Range Status   Specimen Description URINE, CLEAN CATCH   Final   Special Requests NONE   Final   Culture  Setup Time     Final   Value: 05/01/2013 03:01     Performed at [facility] Count     Final   Value: 15,000 COLONIES/ML     Performed at [facility]   Culture     Final   Value: Multiple bacterial morphotypes present, none predominant. Suggest appropriate recollection if clinically indicated.     Performed at [facility]   Report Status 05/02/2013 FINAL   Final     Studies:              All Imaging reviewed and is as per above notation   Scheduled Meds: . antiseptic oral rinse  15 mL Mouth Rinse BID  . feeding supplement  1 Container Oral BID BM  . folic acid  1 mg Oral Daily  . LORazepam  0-4 mg Intravenous Q6H   Followed by  .  LORazepam  0-4 mg Intravenous Q12H  . multivitamin with minerals  1 tablet Oral Daily  . pantoprazole  40 mg Oral BID  . [START ON 05/04/2013] pantoprazole (PROTONIX) IV  40 mg Intravenous Q12H  . sodium chloride  1,000 mL Intravenous Once  . sodium chloride  3 mL Intravenous Q12H  . sucralfate  1 g Oral TID WC & HS  . thiamine  100 mg Oral Daily   Or  . thiamine  100 mg Intravenous Daily   Continuous Infusions: . sodium chloride 125 mL/hr at 05/01/13 0445  . sodium chloride 20 mL/hr at 05/01/13  0906  . sodium chloride 20 mL/hr at 05/01/13 0906     Assessment/Plan: 1.  Acute severe upper GI bleed with bleeding ulcer-status post epinephrine injection 05/01/13-known history of duodenal ulcer being oversewn January 2014- high risk fo further complication-we'll alert general surgery as an FYI-appreciate gastroenterology input.  Protonix GTt  to PO 40 bid 05/02/13, continue Carafate 1 g 3 times a day with meals, per GI can eat meal this pm 10/2.  Continue IV fluids 75 cc/hour now 10/2 Would score him as a Rockall stage 3-4 2. Severe Iron deficiency anemia, probably 2/2 to #1 as acute bleeding-folate/b12 wnl-start Iron PO in 1-2 days to not confound picture 3. History of alcohol habituation-CIWA score now 0-drinks 2 40s daily Olde English beer. Once patient is more alert we'll encourage cessation-continue Ativan protocol.  Discontinued Tylenol. 4. Acute kidney injury-potentially secondary to effect of bleeding into the duodenum-continue IV saline as above, basic metabolic panel a.m. 5. Moderate to severe malnutrition-albumin is low-nutritionist input appreciated once patient can take a full diet 6. Tob habituation-will offer patch if seems to wish this 7. He doesn't have medical capacity to make decisions, per Psychiatric consult 05/01/13-appreciate input  Code Status:  Full Family Communication:  Updated 05/01/13-his healthcare power of attorney is Jobe Igo [phone] Disposition Plan: SDU-likely transfer to Med-surg if no bleeding on 05/03/13  Pleas Koch, MD  [facility] Pager [phone] 05/02/2013, 12:45 PM    LOS: 2 days

## 2013-05-02 NOTE — Progress Notes (Signed)
Patient ID: [name], male   DOB: 06/06/1961, 52 y.o.   MRN: [id number] Request received for possible mesenteric arteriogram with possible embolization in pt with hx recurrent GI bleed and prior oversewing of bleeding duodenal ulcer. He is s/p epi injection around ulcer base 10/1 by Dr. [name]. Pt reports no further bleeding today. Current hgb 9.6. Additional PMH as below.Exam: pt awake, answers questions appropriately and not beligerent; chest- CTA bilat; heart- RRR; abd- soft,+BS,NT; ext- FROM, no edema.   Filed Vitals:   05/02/13 0430 05/02/13 0800 05/02/13 1208 05/02/13 1600  BP: 129/63 133/68 151/80 155/88  Pulse:  75 80 79  Temp:  98.5 F (36.9 C) 98.4 F (36.9 C) 99.3 F (37.4 C)  TempSrc:  Oral Oral Oral  Resp:  24 13 25   Height:      Weight:      SpO2:  97% 98% 98%   Past Medical History  Diagnosis Date  . Abdominal  pain, other specified site   . GERD (gastroesophageal reflux disease)   . Abnormal CT scan, esophagus 03/2012  . Substance abuse 03/2012    tox screen positive for cocaine.    Past Surgical History  Procedure Laterality Date  . Undescended testicle      on right. noted on 03/2012 CT scan.  no surgery referral.   . Esophagogastroduodenoscopy  08/10/2012    Procedure: ESOPHAGOGASTRODUODENOSCOPY (EGD);  Surgeon: [name], MD;  Location: [facility] ENDOSCOPY;  Service: Endoscopy;  Laterality: N/A;  will be done in room 1   . Laparotomy  08/10/2012    Procedure: EXPLORATORY LAPAROTOMY;  Surgeon: [name], MD;  Location: [facility] OR;  Service: General;  Laterality: N/A;  . Esophagogastroduodenoscopy Left 05/01/2013    Procedure: ESOPHAGOGASTRODUODENOSCOPY (EGD);  Surgeon: [name], MD;  Location: [facility] ENDOSCOPY;  Service: Endoscopy;  Laterality: Left;   Ct Head Wo Contrast  04/30/2013   CLINICAL DATA:  Syncope and weakness.  EXAM: CT HEAD WITHOUT CONTRAST  TECHNIQUE: Contiguous axial images were obtained from the base of the skull through the vertex without intravenous  contrast.  COMPARISON:  04/25/2013.  FINDINGS: The ventricles are normal in size and configuration. No extra-axial fluid collections are identified. The gray-white differentiation is normal. No CT findings for acute intracranial process such as hemorrhage or infarction. No mass lesions. The brainstem and cerebellum are grossly normal.  The bony structures are intact. The paranasal sinuses and mastoid air cells are clear. Evidence of prior sinonasal surgery. The globes are intact.  IMPRESSION: No acute intracranial findings or mass lesion.   Electronically Signed   By: Loralie Champagne M.D.   On: 04/30/2013 22:35   Ct Head Wo Contrast  04/25/2013   CLINICAL DATA:  Confusion, lethargy, altered mental status  EXAM: CT HEAD WITHOUT CONTRAST  TECHNIQUE: Contiguous axial images were obtained from the base of the skull through the vertex without intravenous contrast.  COMPARISON:  None.  FINDINGS: No skull fracture is noted. Paranasal sinuses and mastoid air cells are unremarkable.  No intracranial hemorrhage, mass effect or midline shift.  No hydrocephalus. The gray and white-matter differentiation is preserved.  No acute infarction. No mass lesion is noted on this unenhanced scan. No intra or extra-axial fluid collection.  IMPRESSION: No acute intracranial abnormality.   Electronically Signed   By: Natasha Mead   On: 04/25/2013 15:12   Dg Chest Port 1 View  04/30/2013   CLINICAL DATA:  Leukocytosis and weakness.  EXAM: PORTABLE CHEST - 1 VIEW  COMPARISON:  03/23/2013  FINDINGS: The heart size and mediastinal contours are within normal limits. Stable bilateral parenchymal scarring present. There is atelectasis at the right lung base. The visualized skeletal structures are unremarkable.  IMPRESSION: No focal infiltrate identified. Atelectasis present at the right lung base.   Electronically Signed   By: Irish Lack   On: 04/30/2013 19:16   Dg Abd Portable 1v  04/25/2013   CLINICAL DATA:  Abdominal pain.  EXAM:  PORTABLE ABDOMEN - 1 VIEW  COMPARISON:  03/23/2013.  FINDINGS: Motion blurring. Grossly normal bowel gas pattern. Moderate amount of stool in the rectum. Air in the penile urethra. Normal appearing bones.  IMPRESSION: 1. Moderate stool in the rectum. 2. Air in the penile urethra, of unknown significance. 3. Otherwise, unremarkable examination.   Electronically Signed   By: Gordan Payment   On: 04/25/2013 19:38  Results for orders placed during the hospital encounter of 04/30/13  URINE CULTURE      Result Value Range   Specimen Description URINE, CLEAN CATCH     Special Requests NONE     Culture  Setup Time       Value: 05/01/2013 03:01     Performed at [facility] Count       Value: 15,000 COLONIES/ML     Performed at [facility]   Culture       Value: Multiple bacterial morphotypes present, none predominant. Suggest appropriate recollection if clinically indicated.     Performed at [facility]   Report Status 05/02/2013 FINAL    MRSA PCR SCREENING      Result Value Range   MRSA by PCR NEGATIVE  NEGATIVE  CBC WITH DIFFERENTIAL      Result Value Range   WBC 11.8 (*) 4.0 - 10.5 K/uL   RBC 2.11 (*) 4.22 - 5.81 MIL/uL   Hemoglobin 5.7 (*) 13.0 - 17.0 g/dL   HCT 16.1 (*) 09.6 - 04.5 %   MCV 79.6  78.0 - 100.0 fL   MCH 27.0  26.0 - 34.0 pg   MCHC 33.9  30.0 - 36.0 g/dL   RDW 40.9 (*) 81.1 - 91.4 %   Platelets 167  150 - 400 K/uL   Neutrophils Relative % 73  43 - 77 %   Lymphocytes Relative 18  12 - 46 %   Monocytes Relative 8  3 - 12 %   Eosinophils Relative 1  0 - 5 %   Basophils Relative 0  0 - 1 %   Neutro Abs 8.7 (*) 1.7 - 7.7 K/uL   Lymphs Abs 2.1  0.7 - 4.0 K/uL   Monocytes Absolute 0.9  0.1 - 1.0 K/uL   Eosinophils Absolute 0.1  0.0 - 0.7 K/uL   Basophils Absolute 0.0  0.0 - 0.1 K/uL   Smear Review MORPHOLOGY UNREMARKABLE    BASIC METABOLIC PANEL      Result Value Range   Sodium 139  135 - 145 mEq/L   Potassium 3.6  3.5 - 5.1 mEq/L    Chloride 107  96 - 112 mEq/L   CO2 24  19 - 32 mEq/L   Glucose, Bld 95  70 - 99 mg/dL   BUN 24 (*) 6 - 23 mg/dL   Creatinine, Ser 7.82  0.50 - 1.35 mg/dL   Calcium 8.1 (*) 8.4 - 10.5 mg/dL   GFR calc non Af Amer >90  >90 mL/min   GFR calc Af Amer >90  >  90 mL/min  URINE RAPID DRUG SCREEN (HOSP PERFORMED)      Result Value Range   Opiates NONE DETECTED  NONE DETECTED   Cocaine NONE DETECTED  NONE DETECTED   Benzodiazepines NONE DETECTED  NONE DETECTED   Amphetamines NONE DETECTED  NONE DETECTED   Tetrahydrocannabinol NONE DETECTED  NONE DETECTED   Barbiturates NONE DETECTED  NONE DETECTED  AMMONIA      Result Value Range   Ammonia 23  11 - 60 umol/L  URINALYSIS, ROUTINE W REFLEX MICROSCOPIC      Result Value Range   Color, Urine YELLOW  YELLOW   APPearance CLEAR  CLEAR   Specific Gravity, Urine 1.017  1.005 - 1.030   pH 5.0  5.0 - 8.0   Glucose, UA NEGATIVE  NEGATIVE mg/dL   Hgb urine dipstick NEGATIVE  NEGATIVE   Bilirubin Urine NEGATIVE  NEGATIVE   Ketones, ur NEGATIVE  NEGATIVE mg/dL   Protein, ur NEGATIVE  NEGATIVE mg/dL   Urobilinogen, UA 0.2  0.0 - 1.0 mg/dL   Nitrite NEGATIVE  NEGATIVE   Leukocytes, UA NEGATIVE  NEGATIVE  MAGNESIUM      Result Value Range   Magnesium 2.0  1.5 - 2.5 mg/dL  PHOSPHORUS      Result Value Range   Phosphorus 2.4  2.3 - 4.6 mg/dL  APTT      Result Value Range   aPTT 29  24 - 37 seconds  PROTIME-INR      Result Value Range   Prothrombin Time 13.6  11.6 - 15.2 seconds   INR 1.06  0.00 - 1.49  CBC      Result Value Range   WBC 10.4  4.0 - 10.5 K/uL   RBC 1.99 (*) 4.22 - 5.81 MIL/uL   Hemoglobin 5.3 (*) 13.0 - 17.0 g/dL   HCT 16.1 (*) 09.6 - 04.5 %   MCV 79.9  78.0 - 100.0 fL   MCH 26.6  26.0 - 34.0 pg   MCHC 33.3  30.0 - 36.0 g/dL   RDW 40.9 (*) 81.1 - 91.4 %   Platelets 153  150 - 400 K/uL  CBC      Result Value Range   WBC 10.3  4.0 - 10.5 K/uL   RBC 3.23 (*) 4.22 - 5.81 MIL/uL   Hemoglobin 8.8 (*) 13.0 - 17.0 g/dL   HCT  78.2 (*) 95.6 - 52.0 %   MCV 80.8  78.0 - 100.0 fL   MCH 27.2  26.0 - 34.0 pg   MCHC 33.7  30.0 - 36.0 g/dL   RDW 21.3  08.6 - 57.8 %   Platelets 123 (*) 150 - 400 K/uL  TSH      Result Value Range   TSH 0.604  0.350 - 4.500 uIU/mL  COMPREHENSIVE METABOLIC PANEL      Result Value Range   Sodium 140  135 - 145 mEq/L   Potassium 3.9  3.5 - 5.1 mEq/L   Chloride 110  96 - 112 mEq/L   CO2 24  19 - 32 mEq/L   Glucose, Bld 94  70 - 99 mg/dL   BUN 20  6 - 23 mg/dL   Creatinine, Ser 4.69  0.50 - 1.35 mg/dL   Calcium 7.7 (*) 8.4 - 10.5 mg/dL   Total Protein 4.3 (*) 6.0 - 8.3 g/dL   Albumin 1.8 (*) 3.5 - 5.2 g/dL   AST 17  0 - 37 U/L   ALT 14  0 - 53  U/L   Alkaline Phosphatase 37 (*) 39 - 117 U/L   Total Bilirubin 0.3  0.3 - 1.2 mg/dL   GFR calc non Af Amer 79 (*) >90 mL/min   GFR calc Af Amer >90  >90 mL/min  PROTIME-INR      Result Value Range   Prothrombin Time 13.4  11.6 - 15.2 seconds   INR 1.04  0.00 - 1.49  VITAMIN B12      Result Value Range   Vitamin B-12 502  211 - 911 pg/mL  FOLATE      Result Value Range   Folate 10.2    IRON AND TIBC      Result Value Range   Iron <10 (*) 42 - 135 ug/dL   TIBC Not calculated due to Iron <10.  215 - 435 ug/dL   Saturation Ratios Not calculated due to Iron <10.  20 - 55 %   UIBC 255  125 - 400 ug/dL  FERRITIN      Result Value Range   Ferritin 20 (*) 22 - 322 ng/mL  RPR      Result Value Range   RPR NON REACTIVE  NON REACTIVE  HEMOGLOBIN AND HEMATOCRIT, BLOOD      Result Value Range   Hemoglobin 8.7 (*) 13.0 - 17.0 g/dL   HCT 81.1 (*) 91.4 - 78.2 %  HEMOGLOBIN AND HEMATOCRIT, BLOOD      Result Value Range   Hemoglobin 9.1 (*) 13.0 - 17.0 g/dL   HCT 95.6 (*) 21.3 - 08.6 %  HEMOGLOBIN AND HEMATOCRIT, BLOOD      Result Value Range   Hemoglobin 9.6 (*) 13.0 - 17.0 g/dL   HCT 57.8 (*) 46.9 - 62.9 %  TYPE AND SCREEN      Result Value Range   ABO/RH(D) O NEG     Antibody Screen POS     Sample Expiration 05/03/2013     DAT,  IgG NEG     Antibody Identification ANTI-D     Unit Number [id number]     Blood Component Type RED CELLS,LR     Unit division 00     Status of Unit ISSUED,FINAL     Transfusion Status OK TO TRANSFUSE     Crossmatch Result COMPATIBLE     Unit Number [id number]     Blood Component Type RED CELLS,LR     Unit division 00     Status of Unit ISSUED,FINAL     Transfusion Status OK TO TRANSFUSE     Crossmatch Result COMPATIBLE     Unit Number [id number]     Blood Component Type RED CELLS,LR     Unit division 00     Status of Unit ISSUED,FINAL     Transfusion Status OK TO TRANSFUSE     Crossmatch Result COMPATIBLE     Unit Number [id number]     Blood Component Type RBC LR PHER2     Unit division 00     Status of Unit ISSUED,FINAL     Transfusion Status OK TO TRANSFUSE     Crossmatch Result COMPATIBLE    PREPARE RBC (CROSSMATCH)      Result Value Range   Order Confirmation ORDER PROCESSED BY BLOOD BANK    PREPARE RBC (CROSSMATCH)      Result Value Range   Order Confirmation ORDER PROCESSED BY BLOOD BANK    A/P: Pt with hx recurrent GI bleed, s/p oversewing of bleeding duodenal ulcer 08/2012 and recent epi injection of ulcer  base 10/1. Pt currently stable with hgb 9.6 and no further bleeding today. Details/risks of  mesenteric arteriogram with possible embolization d/w pt with his apparent understanding at this time. He is aware that if he rebleeds above procedure can be performed.

## 2013-05-02 NOTE — Evaluation (Signed)
Occupational Therapy Evaluation Patient Details Name: Coleman Kalas MRN: [id number] DOB: 25-Apr-1961 Today's Date: 05/02/2013 Time: 4098-1191 OT Time Calculation (min): 14 min  OT Assessment / Plan / Recommendation History of present illness 52 year old ? admitted 9/292014 with severe venous, syncope after leaving AGAINST MEDICAL ADVICE 04/25/2013 severe microcytic anemia anemia, hemoglobin 2.7, acute encephalopathy -transfused 6 units total of packed red blood cells   Clinical Impression   Pt presents to OT with decreased I with ADL activity due to problems listed below. Pt will benefit from skilled OT to increase I with ADL activity and return to PLOF    OT Assessment  Patient needs continued OT Services    Follow Up Recommendations  SNF    Barriers to Discharge      Equipment Recommendations  None recommended by OT       Frequency  Min 2X/week    Precautions / Restrictions Precautions Precautions: Fall       ADL  Grooming: Set up Where Assessed - Grooming: Supported sitting Upper Body Bathing: Set up Where Assessed - Upper Body Bathing: Supported sitting Lower Body Bathing: Moderate assistance Where Assessed - Lower Body Bathing: Supported sit to stand Upper Body Dressing: Minimal assistance Where Assessed - Upper Body Dressing: Unsupported sitting Lower Body Dressing: Moderate assistance Where Assessed - Lower Body Dressing: Supported sit to Pharmacist, hospital: Minimal assistance Toilet Transfer Method: Sit to stand ADL Comments: Pt very focused on having information from MD stating he needed to get home.      OT Diagnosis: Generalized weakness  OT Problem List: Decreased strength;Decreased safety awareness OT Treatment Interventions: Self-care/ADL training;Patient/family education   OT Goals(Current goals can be found in the care plan section) Acute Rehab OT Goals Patient Stated Goal: get on out of here OT Goal Formulation: With patient Time For Goal  Achievement: 05/16/13 Potential to Achieve Goals: Good  Visit Information  Last OT Received On: 05/02/13 Assistance Needed: +1 History of Present Illness: 52 year old ? admitted 9/292014 with severe venous, syncope after leaving AGAINST MEDICAL ADVICE 04/25/2013 severe microcytic anemia anemia, hemoglobin 2.7, acute encephalopathy -transfused 6 units total of packed red blood cells       Prior Functioning     Home Living Family/patient expects to be discharged to:: Group home Living Arrangements: Other relatives;Non-relatives/Friends Additional Comments: pt reports he lives with 3 other people Prior Function Level of Independence: Independent         Vision/Perception Vision - History Patient Visual Report: No change from baseline   Cognition  Cognition Arousal/Alertness: Awake/alert Behavior During Therapy: WFL for tasks assessed/performed Overall Cognitive Status: Impaired/Different from baseline Area of Impairment: Orientation Orientation Level: Disoriented to;Place;Time;Situation    Extremity/Trunk Assessment Upper Extremity Assessment Upper Extremity Assessment: Overall WFL for tasks assessed     Mobility Bed Mobility Bed Mobility: Supine to Sit Supine to Sit: 4: Min assist Details for Bed Mobility Assistance: verbal cues for safety Transfers Sit to Stand: 4: Min assist Stand to Sit: 4: Min assist Details for Transfer Assistance: verbal cues for hand placement and safety           End of Session OT - End of Session Activity Tolerance: Patient tolerated treatment well Patient left: in chair;with call bell/phone within reach  GO     Yuriy Cui, [place] 05/02/2013, 12:26 PM

## 2013-05-02 NOTE — Consult Note (Signed)
Reason for Consult:  Recurrent GI Bleed Referring Physician: Dr. Jeani Hawking  [name] is an 52 y.o. male.  HPI: difficult gentleman with history of GI bleed that could not be controlled laparoscopically by Dr. Gerilyn Pilgrim in Jan 2014.  He was taken to the OR for exploratory lap and over sewing of the bleeding duodenal ulcer. He was brought to the ER on 04/25/13 by the family with a fall and confusion.  H/H was 2.7/8.9,  WBC of 20.7.  He received 6 units of PRBC and then signed out AMA on 04/29/13.  He was being treated with ETOH withdrawal, he was combative at that time,  and there is some communication from family suggesting he also uses cocaine. He returned on 9/30 with complaints of being weak and dizzy. H/h on 9/27 was: 8.7/26.3, down to 7/20.3 on 9/29 when he signed out AMA.  Now on return with symptomatic anemia his H/H was 5.7/16.8.  He has recieved another 4 units of PRBC. His last H/H today was 9.6/27.8.   EGD by Dr. 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 6 cc's of 1:10, 000 epinephrine was injected around the ulcer base and hemostasis was achieved.  Pt H/H is stable, he has resumed his agitated behavior, and is currently demanding to go home.  He has been seen by Dr. Cleotis Nipper, Psychiatry. It is his opinion pt does not have capacity to participate in his treatment plan at this time. Psych has signed off at this point.  At this point he primarily wants to go home.  He has not allowed any one to examine him today. Dr. Elnoria Howard has called and ask for an opinion about care.  He is concerned pt will bleed again and is at significant risk.        Past Medical History  Diagnosis Date  . Abdominal  pain, other specified site   . GERD (gastroesophageal reflux disease)   . Abnormal CT scan, esophagus 03/2012  . Substance abuse 03/2012    tox screen positive for cocaine.     Past Surgical History  Procedure Laterality Date  .  Undescended testicle      on right. noted on 03/2012 CT scan.  no surgery referral.   . Esophagogastroduodenoscopy  08/10/2012    Procedure: ESOPHAGOGASTRODUODENOSCOPY (EGD);  Surgeon: [name], MD;  Location: [facility] ENDOSCOPY;  Service: Endoscopy;  Laterality: N/A;  will be done in room 1   . Laparotomy  08/10/2012    Procedure: EXPLORATORY LAPAROTOMY;  Surgeon: [name], MD;  Location: [facility] OR;  Service: General;  Laterality: N/A;  . Esophagogastroduodenoscopy Left 05/01/2013    Procedure: ESOPHAGOGASTRODUODENOSCOPY (EGD);  Surgeon: [name], MD;  Location: [facility] ENDOSCOPY;  Service: Endoscopy;  Laterality: Left;    History reviewed. No pertinent family history.  Social History:  reports that he has been smoking.  He has never used smokeless tobacco. He reports that  drinks alcohol. He reports that he uses illicit drugs (Marijuana).  Allergies:  Allergies  Allergen Reactions  . Chocolate Nausea And Vomiting  . Peanut-Containing Drug Products Nausea And Vomiting  . Spinach Nausea And Vomiting    Medications:  Prior to Admission:  No prescriptions prior to admission   Scheduled: . antiseptic oral rinse  15 mL Mouth Rinse BID  . feeding supplement  1 Container Oral BID BM  . folic acid  1 mg Oral Daily  . LORazepam  0-4 mg Intravenous Q6H   Followed by  . LORazepam  0-4 mg Intravenous Q12H  . multivitamin with minerals  1 tablet Oral Daily  . pantoprazole  40 mg Oral BID  . [START ON 05/04/2013] pantoprazole (PROTONIX) IV  40 mg Intravenous Q12H  . sodium chloride  1,000 mL Intravenous Once  . sodium chloride  3 mL Intravenous Q12H  . sucralfate  1 g Oral TID WC & HS  . thiamine  100 mg Oral Daily   Or  . thiamine  100 mg Intravenous Daily   Continuous: . sodium chloride 125 mL/hr at 05/01/13 0445  . sodium chloride 20 mL/hr at 05/01/13 0906  . sodium chloride 20 mL/hr at 05/01/13 0906   GEX:[id number], LORazepam, ondansetron (ZOFRAN) IV, ondansetron, oxyCODONE,  polyethylene glycol, sorbitol Anti-infectives   None      Results for orders placed during the hospital encounter of 04/30/13 (from the past 48 hour(s))  CBC WITH DIFFERENTIAL     Status: Abnormal   Collection Time    04/30/13  4:40 PM      Result Value Range   WBC 11.8 (*) 4.0 - 10.5 K/uL   Comment: ADJUSTED FOR NUCLEATED RBC'S   RBC 2.11 (*) 4.22 - 5.81 MIL/uL   Hemoglobin 5.7 (*) 13.0 - 17.0 g/dL   Comment: RESULT REPEATED AND VERIFIED     CRITICAL RESULT CALLED TO, READ BACK BY AND VERIFIED WITH:     HAMILTON,J. AT 1706 ON 09.30.14 BY LOVE,T.   HCT 16.8 (*) 39.0 - 52.0 %   MCV 79.6  78.0 - 100.0 fL   MCH 27.0  26.0 - 34.0 pg   MCHC 33.9  30.0 - 36.0 g/dL   RDW 01.0 (*) 27.2 - 53.6 %   Platelets 167  150 - 400 K/uL   Neutrophils Relative % 73  43 - 77 %   Lymphocytes Relative 18  12 - 46 %   Monocytes Relative 8  3 - 12 %   Eosinophils Relative 1  0 - 5 %   Basophils Relative 0  0 - 1 %   Neutro Abs 8.7 (*) 1.7 - 7.7 K/uL   Lymphs Abs 2.1  0.7 - 4.0 K/uL   Monocytes Absolute 0.9  0.1 - 1.0 K/uL   Eosinophils Absolute 0.1  0.0 - 0.7 K/uL   Basophils Absolute 0.0  0.0 - 0.1 K/uL   Smear Review MORPHOLOGY UNREMARKABLE    BASIC METABOLIC PANEL     Status: Abnormal   Collection Time    04/30/13  4:40 PM      Result Value Range   Sodium 139  135 - 145 mEq/L   Potassium 3.6  3.5 - 5.1 mEq/L   Chloride 107  96 - 112 mEq/L   CO2 24  19 - 32 mEq/L   Glucose, Bld 95  70 - 99 mg/dL   BUN 24 (*) 6 - 23 mg/dL   Creatinine, Ser 6.44  0.50 - 1.35 mg/dL   Calcium 8.1 (*) 8.4 - 10.5 mg/dL   GFR calc non Af Amer >90  >90 mL/min   GFR calc Af Amer >90  >90 mL/min   Comment: (NOTE)     The eGFR has been calculated using the CKD EPI equation.     This calculation has not been validated in all clinical situations.     eGFR's persistently <90 mL/min signify possible Chronic Kidney     Disease.  TYPE AND SCREEN  Status: None   Collection Time    04/30/13  4:40 PM      Result  Value Range   ABO/RH(D) O NEG     Antibody Screen POS     Sample Expiration 05/03/2013     DAT, IgG NEG     Antibody Identification ANTI-D     Unit Number [id number]     Blood Component Type RED CELLS,LR     Unit division 00     Status of Unit ISSUED,FINAL     Transfusion Status OK TO TRANSFUSE     Crossmatch Result COMPATIBLE     Unit Number [id number]     Blood Component Type RED CELLS,LR     Unit division 00     Status of Unit ISSUED,FINAL     Transfusion Status OK TO TRANSFUSE     Crossmatch Result COMPATIBLE     Unit Number [id number]     Blood Component Type RED CELLS,LR     Unit division 00     Status of Unit ISSUED,FINAL     Transfusion Status OK TO TRANSFUSE     Crossmatch Result COMPATIBLE     Unit Number [id number]     Blood Component Type RBC LR PHER2     Unit division 00     Status of Unit ISSUED,FINAL     Transfusion Status OK TO TRANSFUSE     Crossmatch Result COMPATIBLE    PREPARE RBC (CROSSMATCH)     Status: None   Collection Time    04/30/13  5:22 PM      Result Value Range   Order Confirmation ORDER PROCESSED BY BLOOD BANK    MRSA PCR SCREENING     Status: None   Collection Time    04/30/13  6:42 PM      Result Value Range   MRSA by PCR NEGATIVE  NEGATIVE   Comment:            The GeneXpert MRSA Assay (FDA     approved for NASAL specimens     only), is one component of a     comprehensive MRSA colonization     surveillance program. It is not     intended to diagnose MRSA     infection nor to guide or     monitor treatment for     MRSA infections.     Performed at [facility]  AMMONIA     Status: None   Collection Time    04/30/13  6:51 PM      Result Value Range   Ammonia 23  11 - 60 umol/L  PREPARE RBC (CROSSMATCH)     Status: None   Collection Time    04/30/13  7:00 PM      Result Value Range   Order Confirmation ORDER PROCESSED BY BLOOD BANK    TSH     Status: None   Collection Time    04/30/13  8:33 PM       Result Value Range   TSH 0.604  0.350 - 4.500 uIU/mL   Comment: Performed at [facility]  MAGNESIUM     Status: None   Collection Time    04/30/13  8:34 PM      Result Value Range   Magnesium 2.0  1.5 - 2.5 mg/dL  PHOSPHORUS     Status: None   Collection Time    04/30/13  8:34 PM      Result Value Range  Phosphorus 2.4  2.3 - 4.6 mg/dL  APTT     Status: None   Collection Time    04/30/13  8:34 PM      Result Value Range   aPTT 29  24 - 37 seconds  PROTIME-INR     Status: None   Collection Time    04/30/13  8:34 PM      Result Value Range   Prothrombin Time 13.6  11.6 - 15.2 seconds   INR 1.06  0.00 - 1.49  CBC     Status: Abnormal   Collection Time    04/30/13  8:34 PM      Result Value Range   WBC 10.4  4.0 - 10.5 K/uL   RBC 1.99 (*) 4.22 - 5.81 MIL/uL   Hemoglobin 5.3 (*) 13.0 - 17.0 g/dL   Comment: CRITICAL VALUE NOTED.  VALUE IS CONSISTENT WITH PREVIOUSLY REPORTED AND CALLED VALUE.     REPEATED TO VERIFY   HCT 15.9 (*) 39.0 - 52.0 %   MCV 79.9  78.0 - 100.0 fL   MCH 26.6  26.0 - 34.0 pg   MCHC 33.3  30.0 - 36.0 g/dL   RDW 16.1 (*) 09.6 - 04.5 %   Platelets 153  150 - 400 K/uL  VITAMIN B12     Status: None   Collection Time    04/30/13  8:34 PM      Result Value Range   Vitamin B-12 502  211 - 911 pg/mL   Comment: Performed at [facility]  FOLATE     Status: None   Collection Time    04/30/13  8:34 PM      Result Value Range   Folate 10.2     Comment: (NOTE)     Reference Ranges            Deficient:       0.4 - 3.3 ng/mL            Indeterminate:   3.4 - 5.4 ng/mL            Normal:              > 5.4 ng/mL     Performed at [facility]  IRON AND TIBC     Status: Abnormal   Collection Time    04/30/13  8:34 PM      Result Value Range   Iron <10 (*) 42 - 135 ug/dL   TIBC Not calculated due to Iron <10.  215 - 435 ug/dL   Saturation Ratios Not calculated due to Iron <10.  20 - 55 %   UIBC 255  125 - 400 ug/dL   Comment:  Performed at [facility]  FERRITIN     Status: Abnormal   Collection Time    04/30/13  8:34 PM      Result Value Range   Ferritin 20 (*) 22 - 322 ng/mL   Comment: Performed at [facility]  RPR     Status: None   Collection Time    04/30/13  8:34 PM      Result Value Range   RPR NON REACTIVE  NON REACTIVE   Comment: Performed at [facility]  URINE RAPID DRUG SCREEN (HOSP PERFORMED)     Status: None   Collection Time    04/30/13  9:02 PM      Result Value Range   Opiates NONE DETECTED  NONE DETECTED   Cocaine  NONE DETECTED  NONE DETECTED   Benzodiazepines NONE DETECTED  NONE DETECTED   Amphetamines NONE DETECTED  NONE DETECTED   Tetrahydrocannabinol NONE DETECTED  NONE DETECTED   Barbiturates NONE DETECTED  NONE DETECTED   Comment:            DRUG SCREEN FOR MEDICAL PURPOSES     ONLY.  IF CONFIRMATION IS NEEDED     FOR ANY PURPOSE, NOTIFY LAB     WITHIN 5 DAYS.                LOWEST DETECTABLE LIMITS     FOR URINE DRUG SCREEN     Drug Class       Cutoff (ng/mL)     Amphetamine      1000     Barbiturate      200     Benzodiazepine   200     Tricyclics       300     Opiates          300     Cocaine          300     THC              50  URINALYSIS, ROUTINE W REFLEX MICROSCOPIC     Status: None   Collection Time    04/30/13  9:02 PM      Result Value Range   Color, Urine YELLOW  YELLOW   APPearance CLEAR  CLEAR   Specific Gravity, Urine 1.017  1.005 - 1.030   pH 5.0  5.0 - 8.0   Glucose, UA NEGATIVE  NEGATIVE mg/dL   Hgb urine dipstick NEGATIVE  NEGATIVE   Bilirubin Urine NEGATIVE  NEGATIVE   Ketones, ur NEGATIVE  NEGATIVE mg/dL   Protein, ur NEGATIVE  NEGATIVE mg/dL   Urobilinogen, UA 0.2  0.0 - 1.0 mg/dL   Nitrite NEGATIVE  NEGATIVE   Leukocytes, UA NEGATIVE  NEGATIVE   Comment: MICROSCOPIC NOT DONE ON URINES WITH NEGATIVE PROTEIN, BLOOD, LEUKOCYTES, NITRITE, OR GLUCOSE <1000 mg/dL.  URINE CULTURE     Status: None   Collection Time     04/30/13  9:02 PM      Result Value Range   Specimen Description URINE, CLEAN CATCH     Special Requests NONE     Culture  Setup Time       Value: 05/01/2013 03:01     Performed at [facility] Count       Value: 15,000 COLONIES/ML     Performed at [facility]   Culture       Value: Multiple bacterial morphotypes present, none predominant. Suggest appropriate recollection if clinically indicated.     Performed at [facility]   Report Status 05/02/2013 FINAL    CBC     Status: Abnormal   Collection Time    05/01/13  6:35 AM      Result Value Range   WBC 10.3  4.0 - 10.5 K/uL   RBC 3.23 (*) 4.22 - 5.81 MIL/uL   Hemoglobin 8.8 (*) 13.0 - 17.0 g/dL   Comment: DELTA CHECK NOTED     REPEATED TO VERIFY     POST TRANSFUSION SPECIMEN   HCT 26.1 (*) 39.0 - 52.0 %   MCV 80.8  78.0 - 100.0 fL   MCH 27.2  26.0 - 34.0 pg   MCHC 33.7  30.0 - 36.0 g/dL   RDW 16.1  11.5 - 15.5 %   Platelets 123 (*) 150 - 400 K/uL  COMPREHENSIVE METABOLIC PANEL     Status: Abnormal   Collection Time    05/01/13  6:35 AM      Result Value Range   Sodium 140  135 - 145 mEq/L   Potassium 3.9  3.5 - 5.1 mEq/L   Chloride 110  96 - 112 mEq/L   CO2 24  19 - 32 mEq/L   Glucose, Bld 94  70 - 99 mg/dL   BUN 20  6 - 23 mg/dL   Creatinine, Ser 8.41  0.50 - 1.35 mg/dL   Calcium 7.7 (*) 8.4 - 10.5 mg/dL   Total Protein 4.3 (*) 6.0 - 8.3 g/dL   Albumin 1.8 (*) 3.5 - 5.2 g/dL   AST 17  0 - 37 U/L   ALT 14  0 - 53 U/L   Alkaline Phosphatase 37 (*) 39 - 117 U/L   Total Bilirubin 0.3  0.3 - 1.2 mg/dL   GFR calc non Af Amer 79 (*) >90 mL/min   GFR calc Af Amer >90  >90 mL/min   Comment: (NOTE)     The eGFR has been calculated using the CKD EPI equation.     This calculation has not been validated in all clinical situations.     eGFR's persistently <90 mL/min signify possible Chronic Kidney     Disease.  PROTIME-INR     Status: None   Collection Time    05/01/13  6:35 AM       Result Value Range   Prothrombin Time 13.4  11.6 - 15.2 seconds   INR 1.04  0.00 - 1.49  HEMOGLOBIN AND HEMATOCRIT, BLOOD     Status: Abnormal   Collection Time    05/01/13 12:30 PM      Result Value Range   Hemoglobin 8.7 (*) 13.0 - 17.0 g/dL   HCT 32.4 (*) 40.1 - 02.7 %  HEMOGLOBIN AND HEMATOCRIT, BLOOD     Status: Abnormal   Collection Time    05/01/13 11:30 PM      Result Value Range   Hemoglobin 9.1 (*) 13.0 - 17.0 g/dL   HCT 25.3 (*) 66.4 - 40.3 %  HEMOGLOBIN AND HEMATOCRIT, BLOOD     Status: Abnormal   Collection Time    05/02/13 10:40 AM      Result Value Range   Hemoglobin 9.6 (*) 13.0 - 17.0 g/dL   HCT 47.4 (*) 25.9 - 56.3 %    Ct Head Wo Contrast  04/30/2013   CLINICAL DATA:  Syncope and weakness.  EXAM: CT HEAD WITHOUT CONTRAST  TECHNIQUE: Contiguous axial images were obtained from the base of the skull through the vertex without intravenous contrast.  COMPARISON:  04/25/2013.  FINDINGS: The ventricles are normal in size and configuration. No extra-axial fluid collections are identified. The gray-white differentiation is normal. No CT findings for acute intracranial process such as hemorrhage or infarction. No mass lesions. The brainstem and cerebellum are grossly normal.  The bony structures are intact. The paranasal sinuses and mastoid air cells are clear. Evidence of prior sinonasal surgery. The globes are intact.  IMPRESSION: No acute intracranial findings or mass lesion.   Electronically Signed   By: Loralie Champagne M.D.   On: 04/30/2013 22:35   Dg Chest Port 1 View  04/30/2013   CLINICAL DATA:  Leukocytosis and weakness.  EXAM: PORTABLE CHEST - 1 VIEW  COMPARISON:  03/23/2013  FINDINGS: The heart  size and mediastinal contours are within normal limits. Stable bilateral parenchymal scarring present. There is atelectasis at the right lung base. The visualized skeletal structures are unremarkable.  IMPRESSION: No focal infiltrate identified. Atelectasis present at the right  lung base.   Electronically Signed   By: Irish Lack   On: 04/30/2013 19:16    Review of Systems  Unable to perform ROS: mental acuity   Blood pressure 151/80, pulse 80, temperature 98.4 F (36.9 C), temperature source Oral, resp. rate 13, height 5\' 9"  (1.753 m), weight 74.6 kg (164 lb 7.4 oz), SpO2 98.00%. Physical Exam  Constitutional: He appears well-developed and well-nourished. No distress.  No physical exam was performed, pt did not want to be examined and insisted he was going to leave.  He needed to get outside.    Assessment/Plan: 1.Recurrent GI bleed 2. ETOH abuse/possible cocaine use/marijuana use. Drug screen was negative on admit 04/30/13. 3. Psychiatric evaluation: pt does not have capacity. 4. Severe FE deficiency 5. Malnutrition  6. Tobacco use  Plan:  Currently his H/h shows improvement, and he is stable.  It is Dr. Billey Chang opinion pt should be considered for embolization, if family and he decide to stay in the hospital. Discussed with Dr. Mahala Menghini, and he is calling the family to help with decision process.   [name] 05/02/2013, 3:20 PM

## 2013-05-02 NOTE — Significant Event (Signed)
Called healthcare power of attorney [name] [phone] tomupdate of patient's care.  Will try again. Note Gen surgery consulted for intervention. Long discussion ultimately @ 3:56 PM with [name] has already had a long discussion with patient about needing to stay.  He has no medical decision making capacity per psychiatry and might need embolization of bleeding artery Discussed with IR Dr. Bonnielee Frazier patient-he graciously agrees for patient to be evaluated for embolization Patient to be IVC'd if he refuses as no capacity.  Family member [name] agreeable to Plan of care after my discussion with her regarding grvity of potential 15-20% chance of re-bleed.  Ryan Koch, MD [facility] [phone]

## 2013-05-03 ENCOUNTER — Inpatient Hospital Stay (HOSPITAL_COMMUNITY): Payer: Medicaid Other

## 2013-05-03 LAB — HEMOGLOBIN AND HEMATOCRIT, BLOOD
HCT: 24 % — ABNORMAL LOW (ref 39.0–52.0)
HCT: 24.6 % — ABNORMAL LOW (ref 39.0–52.0)
HCT: 24.8 % — ABNORMAL LOW (ref 39.0–52.0)
Hemoglobin: 8.3 g/dL — ABNORMAL LOW (ref 13.0–17.0)
Hemoglobin: 8.5 g/dL — ABNORMAL LOW (ref 13.0–17.0)

## 2013-05-03 MED ORDER — MIDAZOLAM HCL 2 MG/2ML IJ SOLN
INTRAMUSCULAR | Status: AC
  Filled 2013-05-03: qty 6

## 2013-05-03 MED ORDER — FENTANYL CITRATE 0.05 MG/ML IJ SOLN
INTRAMUSCULAR | Status: AC
  Filled 2013-05-03: qty 6

## 2013-05-03 MED ORDER — MIDAZOLAM HCL 2 MG/2ML IJ SOLN
INTRAMUSCULAR | Status: AC | PRN
  Administered 2013-05-03 (×3): 2 mg via INTRAVENOUS

## 2013-05-03 MED ORDER — FENTANYL CITRATE 0.05 MG/ML IJ SOLN
INTRAMUSCULAR | Status: AC | PRN
  Administered 2013-05-03: 100 ug via INTRAVENOUS

## 2013-05-03 MED ORDER — IOHEXOL 300 MG/ML  SOLN
80.0000 mL | Freq: Once | INTRAMUSCULAR | Status: AC | PRN
  Administered 2013-05-03: 80 mL via INTRA_ARTERIAL

## 2013-05-03 NOTE — Progress Notes (Signed)
Subjective: No acute events.  No complaints at this time.  Objective: Vital signs in last 24 hours: Temp:  [97.6 F (36.4 C)-98.7 F (37.1 C)] 98.2 F (36.8 C) (10/03 1530) Pulse Rate:  [65-95] 84 (10/03 1419) Resp:  [5-23] 22 (10/03 1419) BP: (116-156)/(69-121) 152/93 mmHg (10/03 1419) SpO2:  [94 %-100 %] 100 % (10/03 1419) Weight:  [160 lb 11.5 oz (72.9 kg)] 160 lb 11.5 oz (72.9 kg) (10/03 0500) Last BM Date: 05/01/13  Intake/Output from previous day: 10/02 0701 - 10/03 0700 In: 2260 [P.O.:1015; I.V.:1245] Out: 2975 [Urine:2975] Intake/Output this shift: Total I/O In: 1200 [P.O.:600; I.V.:600] Out: 1775 [Urine:1775]  General appearance: alert and no distress GI: soft, non-tender; bowel sounds normal; no masses,  no organomegaly  Lab Results:  Recent Labs  04/30/13 1640 04/30/13 2034 05/01/13 0635  05/02/13 1040 05/02/13 2335 05/03/13 1110  WBC 11.8* 10.4 10.3  --   --   --   --   HGB 5.7* 5.3* 8.8*  < > 9.6* 8.3* 8.4*  HCT 16.8* 15.9* 26.1*  < > 27.8* 24.0* 24.6*  PLT 167 153 123*  --   --   --   --   < > = values in this interval not displayed. BMET  Recent Labs  04/30/13 1640 05/01/13 0635  NA 139 140  K 3.6 3.9  CL 107 110  CO2 24 24  GLUCOSE 95 94  BUN 24* 20  CREATININE 0.97 1.06  CALCIUM 8.1* 7.7*   LFT  Recent Labs  05/01/13 0635  PROT 4.3*  ALBUMIN 1.8*  AST 17  ALT 14  ALKPHOS 37*  BILITOT 0.3   PT/INR  Recent Labs  04/30/13 2034 05/01/13 0635  LABPROT 13.6 13.4  INR 1.06 1.04   Hepatitis Panel No results found for this basename: HEPBSAG, HCVAB, HEPAIGM, HEPBIGM,  in the last 72 hours C-Diff No results found for this basename: CDIFFTOX,  in the last 72 hours Fecal Lactopherrin No results found for this basename: FECLLACTOFRN,  in the last 72 hours  Studies/Results: Ir Angiogram Visceral Selective  05/03/2013   CLINICAL DATA:  Upper GI hemorrhage from duodenal lesion. Lesion was previously surgically oversewn. Endoscopy  showed arterial lesion and active bleeding. Continued decrease in hematocrit. Empiric gastroduodenal arterial embolization is requested by the surgical service.  TECHNIQUE: ULTRASOUND GUIDANCE FOR VASCULAR ACCESS  SELECTIVE GASTRODUODENAL ARTERY CATHETERIZATION  COIL EMBOLIZATION POST EMBOLIZATION ANGIOGRAM  Technique and findings: Informed consent was obtained from the patient following explanation of the procedure, risks, benefits and alternatives.  The patient understands, agrees and consents for the procedure. All questions were addressed. A time out was performed.No preprocedural antibiotics were indicated. Maximal barrier sterile technique utilized including caps, mask, sterile gowns, sterile gloves, large sterile drape, hand hygiene, and betadine.  Intravenous Fentanyl and Versed were administered as conscious sedation during continuous cardiorespiratory monitoring by the radiology RN, with a total moderate sedation time of 40 minutes.  Under sterile conditions and local anesthesia, 19 gauge single wall ultrasound access performed of the right common femoral artery.  Over a [facility] guide wire, 5-French sheath was inserted. A 5- French C2 catheter was utilized to select the celiac origin. The catheter was advanced into the common hepatic artery. Selective arteriography was performed in multiple projections. Patency of the gastroduodenal artery was identified. No active extravasation was evident. No pseudoaneurysm or AV fistula. There is a large patent right gastric artery also evident from the common hepatic artery. The proper hepatic artery and intrahepatic branches  grossly unremarkable. A renegade micro catheter was advanced over a 0.018 guidewire into the gastroduodenal artery. Confirmatory selective angiogram was performed.  From this location, the GDA was embolized with 3 and 4 mm interlock coils. Following embolization, a post embolization angiogram confirms no further antegrade flow into GDA branches.  Right gastric and hepatic arterial flow is preserved.  The micro catheter and a 5-F catheter were removed safely.  Hemostasis was obtained at the right common femoral artery access site with an Exoseal device. No immediate complication. Distal pedal pulses remained normal. The patient tolerated the treatment well.  FLUOROSCOPY TIME:  7 min 6 seconds  IMPRESSION: Successful gastroduodenal artery coil embolization for upper GI bleed.   Electronically Signed   By: Oley Balm M.D.   On: 05/03/2013 13:06   Ir Embo Art  Peter [facility] [employer] Guide Roadmapping  05/03/2013   CLINICAL DATA:  Upper GI hemorrhage from duodenal lesion. Lesion was previously surgically oversewn. Endoscopy showed arterial lesion and active bleeding. Continued decrease in hematocrit. Empiric gastroduodenal arterial embolization is requested by the surgical service.  TECHNIQUE: ULTRASOUND GUIDANCE FOR VASCULAR ACCESS  SELECTIVE GASTRODUODENAL ARTERY CATHETERIZATION  COIL EMBOLIZATION POST EMBOLIZATION ANGIOGRAM  Technique and findings: Informed consent was obtained from the patient following explanation of the procedure, risks, benefits and alternatives.  The patient understands, agrees and consents for the procedure. All questions were addressed. A time out was performed.No preprocedural antibiotics were indicated. Maximal barrier sterile technique utilized including caps, mask, sterile gowns, sterile gloves, large sterile drape, hand hygiene, and betadine.  Intravenous Fentanyl and Versed were administered as conscious sedation during continuous cardiorespiratory monitoring by the radiology RN, with a total moderate sedation time of 40 minutes.  Under sterile conditions and local anesthesia, 19 gauge single wall ultrasound access performed of the right common femoral artery.  Over a [facility] guide wire, 5-French sheath was inserted. A 5- French C2 catheter was utilized to select the celiac origin. The catheter was advanced into the  common hepatic artery. Selective arteriography was performed in multiple projections. Patency of the gastroduodenal artery was identified. No active extravasation was evident. No pseudoaneurysm or AV fistula. There is a large patent right gastric artery also evident from the common hepatic artery. The proper hepatic artery and intrahepatic branches grossly unremarkable. A renegade micro catheter was advanced over a 0.018 guidewire into the gastroduodenal artery. Confirmatory selective angiogram was performed.  From this location, the GDA was embolized with 3 and 4 mm interlock coils. Following embolization, a post embolization angiogram confirms no further antegrade flow into GDA branches. Right gastric and hepatic arterial flow is preserved.  The micro catheter and a 5-F catheter were removed safely.  Hemostasis was obtained at the right common femoral artery access site with an Exoseal device. No immediate complication. Distal pedal pulses remained normal. The patient tolerated the treatment well.  FLUOROSCOPY TIME:  7 min 6 seconds  IMPRESSION: Successful gastroduodenal artery coil embolization for upper GI bleed.   Electronically Signed   By: Oley Balm M.D.   On: 05/03/2013 13:06    Medications:  Scheduled: . antiseptic oral rinse  15 mL Mouth Rinse BID  . feeding supplement  1 Container Oral BID BM  . fentaNYL      . folic acid  1 mg Oral Daily  . LORazepam  0-4 mg Intravenous Q12H  . midazolam      . multivitamin with minerals  1 tablet Oral Daily  . pantoprazole  40 mg  Oral BID  . [START ON 05/04/2013] pantoprazole (PROTONIX) IV  40 mg Intravenous Q12H  . sodium chloride  3 mL Intravenous Q12H  . sucralfate  1 g Oral TID WC & HS  . thiamine  100 mg Oral Daily   Or  . thiamine  100 mg Intravenous Daily   Continuous: . sodium chloride 75 mL/hr at 05/03/13 0849    Assessment/Plan: 1) Duodenal vessel embolized. 2) Duodenal ulcer.   Appreciate IR and Surgical input as well as  intervention.  HGB appears to be stable.  He seems to be in a better mood today.  Plan: 1) PPI indefinitely. 2) Avoid NSAIDs. 3) Signing off.    LOS: 3 days   [name] D 05/03/2013, 4:18 PM

## 2013-05-03 NOTE — Progress Notes (Signed)
Patient ID: [name], male   DOB: February 02, 1961, 52 y.o.   MRN: [id number] Pt with drop in hgb to 8.3 10/2 pm. No overt bleeding since eval yesterday per pt or nurse. Per [facility] request will now proceed with mesenteric arteriogram/possible embolization today. Details/risks of procedure d/w pt again as well as caregiver/POA Esther Hardy with their understanding and consent.

## 2013-05-03 NOTE — Procedures (Signed)
Embolization Gastroduodenal artery No complication No blood loss. See complete dictation in [facility].

## 2013-05-03 NOTE — Progress Notes (Signed)
[name] EAV:[id number] DOB: [birth year] DOA: 04/30/2013 PCP: [name], MD  Brief narrative: 52 year old ? admitted 9/292014 with severe venous, syncope after leaving AGAINST MEDICAL ADVICE 04/25/2013 [severe microcytic anemia anemia, hemoglobin 2.7, acute encephalopathy -transfused 6 units total of packed red blood cells]  transfused 4 units packed red blood cells placed on protonic drip, assessed by Dr. [name] and endoscopy showed active bleeding/oozing near to area of prior oversewed ulcer  Past medical history-As per Problem list Chart reviewed as below-   Consultants:  Dr. Mann-gastroenterology  Dr. Lolly Mustache, Psychiatry  Procedures:  Endoscopy per Dr. 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 6 cc's of 1:10, 000 epinephrine was injected around the ulcer base and hemostasis was achieved. The small bowel distal to the ulcer upto 60 cm appeared normal  Mesenteric angiogram with embolization of gastroduodenal artery with no complications 05/03/13  Antibiotics:   None currently   Subjective  Doing fair.  denies n/v/cp Oriented and understands need for procedure Patient seen in concert with general surgeon 1 stool yesterday which was dark   Objective    Interim History:  Reviewed  Telemetry:  Sinus rhythm  Objective: Filed Vitals:   05/03/13 1041 05/03/13 1047 05/03/13 1052 05/03/13 1100  BP: 123/72 123/71 125/77 156/77  Pulse: 65 76 69 75  Temp:      TempSrc:      Resp: 11 5 6 16   Height:      Weight:      SpO2: 96% 96% 97% 94%    Intake/Output Summary (Last 24 hours) at 05/03/13 1156 Last data filed at 05/03/13 1100  Gross per 24 hour  Intake   2055 ml  Output   3225 ml  Net  -1170 ml    Exam:  EOMI, NCAT Chest clinically clear S1-S2 no murmur rub or gallop Abdomen soft nontender nondistended no rebound  Data Reviewed: Basic Metabolic Panel:  Recent Labs Lab 04/27/13 0405 04/28/13 0712  04/30/13 1640 04/30/13 2034 05/01/13 0635  NA 141 144 139  --  140  K 4.0 3.7 3.6  --  3.9  CL 111 113* 107  --  110  CO2 22 24 24   --  24  GLUCOSE 87 91 95  --  94  BUN 22 28* 24*  --  20  CREATININE 1.30 1.23 0.97  --  1.06  CALCIUM 7.8* 7.9* 8.1*  --  7.7*  MG  --   --   --  2.0  --   PHOS  --   --   --  2.4  --    Liver Function Tests:  Recent Labs Lab 05/01/13 0635  AST 17  ALT 14  ALKPHOS 37*  BILITOT 0.3  PROT 4.3*  ALBUMIN 1.8*   No results found for this basename: LIPASE, AMYLASE,  in the last 168 hours  Recent Labs Lab 04/30/13 1851  AMMONIA 23   CBC:  Recent Labs Lab 04/28/13 0712 04/29/13 0505 04/30/13 1640 04/30/13 2034 05/01/13 0635 05/01/13 1230 05/01/13 2330 05/02/13 1040 05/02/13 2335 05/03/13 1110  WBC 10.4 9.0 11.8* 10.4 10.3  --   --   --   --   --   NEUTROABS  --   --  8.7*  --   --   --   --   --   --   --   HGB 7.5* 7.0* 5.7* 5.3* 8.8* 8.7* 9.1* 9.6* 8.3* 8.4*  HCT 22.2* 20.3* 16.8* 15.9* 26.1* 25.1*  26.3* 27.8* 24.0* 24.6*  MCV 82.5 80.9 79.6 79.9 80.8  --   --   --   --   --   PLT 166 154 167 153 123*  --   --   --   --   --    Cardiac Enzymes: No results found for this basename: CKTOTAL, CKMB, CKMBINDEX, TROPONINI,  in the last 168 hours BNP: No components found with this basename: POCBNP,  CBG: No results found for this basename: GLUCAP,  in the last 168 hours  Recent Results (from the past 240 hour(s))  MRSA PCR SCREENING     Status: None   Collection Time    04/25/13  6:28 PM      Result Value Range Status   MRSA by PCR NEGATIVE  NEGATIVE Final   Comment:            The GeneXpert MRSA Assay (FDA     approved for NASAL specimens     only), is one component of a     comprehensive MRSA colonization     surveillance program. It is not     intended to diagnose MRSA     infection nor to guide or     monitor treatment for     MRSA infections.  MRSA PCR SCREENING     Status: None   Collection Time    04/30/13  6:42  PM      Result Value Range Status   MRSA by PCR NEGATIVE  NEGATIVE Final   Comment:            The GeneXpert MRSA Assay (FDA     approved for NASAL specimens     only), is one component of a     comprehensive MRSA colonization     surveillance program. It is not     intended to diagnose MRSA     infection nor to guide or     monitor treatment for     MRSA infections.     Performed at [facility]  URINE CULTURE     Status: None   Collection Time    04/30/13  9:02 PM      Result Value Range Status   Specimen Description URINE, CLEAN CATCH   Final   Special Requests NONE   Final   Culture  Setup Time     Final   Value: 05/01/2013 03:01     Performed at [facility] Count     Final   Value: 15,000 COLONIES/ML     Performed at [facility]   Culture     Final   Value: Multiple bacterial morphotypes present, none predominant. Suggest appropriate recollection if clinically indicated.     Performed at [facility]   Report Status 05/02/2013 FINAL   Final     Studies:              All Imaging reviewed and is as per above notation   Scheduled Meds: . antiseptic oral rinse  15 mL Mouth Rinse BID  . feeding supplement  1 Container Oral BID BM  . fentaNYL      . folic acid  1 mg Oral Daily  . LORazepam  0-4 mg Intravenous Q12H  . midazolam      . multivitamin with minerals  1 tablet Oral Daily  . pantoprazole  40 mg Oral BID  . [START ON 05/04/2013] pantoprazole (PROTONIX) IV  40 mg  Intravenous Q12H  . sodium chloride  3 mL Intravenous Q12H  . sucralfate  1 g Oral TID WC & HS  . thiamine  100 mg Oral Daily   Or  . thiamine  100 mg Intravenous Daily   Continuous Infusions: . sodium chloride 75 mL/hr at 05/03/13 0849     Assessment/Plan: 1.  Acute severe upper GI bleed with bleeding ulcer-status post epinephrine injection 05/01/13-known history of duodenal ulcer being oversewn January 2014- high risk fo further complication-general  surgery/GI input appreciated . Patient had embolization of gastroduodenal artery as above 10/3.  Continue Protonix GTt  to PO 40 bid 05/02/13, continue Carafate 1 g 3 times a day with meals, per GI can eat meal this pm 10/3 per IR's instruction.  Continue IV fluids 75 cc/hour now 10/3.   Would score him as a [place] stage 3-4 2. Severe Iron deficiency anemia, probably 2/2 to #1 as acute bleeding-folate/b12 wnl-start Iron PO in 1-2 days to not confound picture 3. History of alcohol habituation-CIWA score now 0-drinks 2 40s daily Olde English beer. Willing to quit as an outpatient he says to me today 10/3 and will join AA . Discontinued Tylenol. 4. Acute kidney injury-potentially secondary to effect of bleeding into the duodenum-continue IV saline as above-suspect and discontinue IV fluids and 24 hours 5. Moderate to severe malnutrition-albumin is low-nutritionist input appreciated once patient can take a full diet 6. Tob habituation-pre-contemplative 7. He doesn't have medical capacity to make decisions, per Psychiatric consult 05/01/13-appreciate input-he has been involuntarily committed  Code Status:  Full Family Communication:  Updated 05/01/13-his healthcare power of attorney is Jobe Igo [phone] Disposition Plan: SDU-likely transfer to Med-surg 05/04/13 if all stable  Pleas Koch, MD  [facility] Pager [phone] 05/03/2013, 11:56 AM    LOS: 3 days

## 2013-05-03 NOTE — Progress Notes (Signed)
2 Days Post-Op  Subjective: No complaints. Denies dark stool since yesterday morning  Objective: Vital signs in last 24 hours: Temp:  [98.1 F (36.7 C)-99.3 F (37.4 C)] 98.7 F (37.1 C) (10/03 0400) Pulse Rate:  [69-95] 69 (10/03 0636) Resp:  [13-25] 23 (10/02 2200) BP: (116-155)/(69-88) 152/79 mmHg (10/03 0636) SpO2:  [96 %-98 %] 98 % (10/03 0636) Weight:  [160 lb 11.5 oz (72.9 kg)] 160 lb 11.5 oz (72.9 kg) (10/03 0500) Last BM Date: 05/01/13  Intake/Output from previous day: 10/02 0701 - 10/03 0700 In: 2185 [P.O.:1015; I.V.:1170] Out: 2975 [Urine:2975] Intake/Output this shift: Total I/O In: -  Out: 350 [Urine:350]  GI: soft and nontender  Lab Results:   Recent Labs  04/30/13 2034 05/01/13 0635  05/02/13 1040 05/02/13 2335  WBC 10.4 10.3  --   --   --   HGB 5.3* 8.8*  < > 9.6* 8.3*  HCT 15.9* 26.1*  < > 27.8* 24.0*  PLT 153 123*  --   --   --   < > = values in this interval not displayed. BMET  Recent Labs  04/30/13 1640 05/01/13 0635  NA 139 140  K 3.6 3.9  CL 107 110  CO2 24 24  GLUCOSE 95 94  BUN 24* 20  CREATININE 0.97 1.06  CALCIUM 8.1* 7.7*   PT/INR  Recent Labs  04/30/13 2034 05/01/13 0635  LABPROT 13.6 13.4  INR 1.06 1.04   ABG No results found for this basename: PHART, PCO2, PO2, HCO3,  in the last 72 hours  Studies/Results: No results found.  Anti-infectives: Anti-infectives   None      Assessment/Plan: s/p Procedure(s): ESOPHAGOGASTRODUODENOSCOPY (EGD) (Left) Since he has had duodenotomy and oversewing of ulcer earlier this year I think trying to repeat this would be quite difficult At this point i would favor embolization of gastroduodenal artery The pt must also quit drinking and take his PPI's Will follow  LOS: 3 days    TOTH III,PAUL S 05/03/2013

## 2013-05-04 DIAGNOSIS — K269 Duodenal ulcer, unspecified as acute or chronic, without hemorrhage or perforation: Secondary | ICD-10-CM

## 2013-05-04 LAB — CBC
HCT: 27 % — ABNORMAL LOW (ref 39.0–52.0)
MCH: 27.1 pg (ref 26.0–34.0)
MCV: 79.4 fL (ref 78.0–100.0)
Platelets: 152 10*3/uL (ref 150–400)
RBC: 3.4 MIL/uL — ABNORMAL LOW (ref 4.22–5.81)
WBC: 10.4 10*3/uL (ref 4.0–10.5)

## 2013-05-04 LAB — HEMOGLOBIN AND HEMATOCRIT, BLOOD
HCT: 25.2 % — ABNORMAL LOW (ref 39.0–52.0)
HCT: 26.1 % — ABNORMAL LOW (ref 39.0–52.0)

## 2013-05-04 NOTE — Progress Notes (Signed)
Physical Therapy Treatment and Discharge from Acute PT Patient Details Name: [name] MRN: [id number] DOB: 1960-09-25 Today's Date: 05/04/2013 Time: 0922-0932 PT Time Calculation (min): 10 min  PT Assessment / Plan / Recommendation  History of Present Illness 52 year old admitted 9/292014 with severe venous, syncope after leaving AGAINST MEDICAL ADVICE 04/25/2013 severe microcytic anemia anemia, hemoglobin 2.7, acute encephalopathy -transfused 6 units total of packed red blood cells   PT Comments   Pt reports feeling much better.  Pt has met all goals and agreeable to d/c from PT at this time.  Pt hopes to d/c home soon.    Follow Up Recommendations  No PT follow up     Does the patient have the potential to tolerate intense rehabilitation     Barriers to Discharge        Equipment Recommendations  None recommended by PT    Recommendations for Other Services    Frequency     Progress towards PT Goals Progress towards PT goals: Goals met/education completed, patient discharged from PT  Plan Other (comment) (d/c from acute PT)    Precautions / Restrictions Precautions Precautions: Fall Precaution Comments: due to lines/leads   Pertinent Vitals/Pain VSS    Mobility  Bed Mobility Bed Mobility: Supine to Sit;Sit to Supine Supine to Sit: 5: Supervision Sit to Supine: 5: Supervision Details for Bed Mobility Assistance: only supervision for cuing for lines/leads Transfers Transfers: Sit to Stand;Stand to Sit Sit to Stand: 6: Modified independent (Device/Increase time);From bed Stand to Sit: 6: Modified independent (Device/Increase time);To bed Ambulation/Gait Ambulation/Gait Assistance: 6: Modified independent (Device/Increase time) Ambulation Distance (Feet): 400 Feet Assistive device: None Ambulation/Gait Assistance Details: no LOB or unsteady gait observed, VSS Gait Pattern: Within Functional Limits Gait velocity: WNL    Exercises     PT Diagnosis:    PT Problem  List:   PT Treatment Interventions:     PT Goals (current goals can now be found in the care plan section)    Visit Information  Last PT Received On: 05/04/13 Assistance Needed: +1 History of Present Illness: 52 year old admitted 9/292014 with severe venous, syncope after leaving AGAINST MEDICAL ADVICE 04/25/2013 severe microcytic anemia anemia, hemoglobin 2.7, acute encephalopathy -transfused 6 units total of packed red blood cells    Subjective Data      Cognition  Cognition Arousal/Alertness: Awake/alert Behavior During Therapy: WFL for tasks assessed/performed Overall Cognitive Status: Within Functional Limits for tasks assessed    Balance     End of Session PT - End of Session Activity Tolerance: Patient tolerated treatment well Patient left: in bed;with call bell/phone within reach Nurse Communication: Mobility status (saw pt ambulating in halls)   GP     Sibley Rolison,KATHrine E 05/04/2013, 9:43 AM Zenovia Jarred, PT, DPT 05/04/2013 Pager: [phone]

## 2013-05-04 NOTE — Progress Notes (Signed)
[name] 1238 TRANSFERRED TO 1301. NO CHANGE IN HIS CONDITION. REPORT WAS CALLED TO 3E RN.

## 2013-05-04 NOTE — Progress Notes (Signed)
Subjective: Pt is 1 day s/p GDA embolization. Feels well. Has been OOB, walking. Tolerating reg diet.  Objective: Physical Exam: BP 161/84  Pulse 87  Temp(Src) 99.2 F (37.3 C) (Oral)  Resp 22  Ht 5\' 9"  (1.753 m)  Wt 172 lb 9.9 oz (78.3 kg)  BMI 25.48 kg/m2  SpO2 97% Abd soft, NT Rt groin puncture site clean, soft. No swelling or hematoma Leg warm, good distal pulses.   Labs: CBC  Recent Labs  05/04/13 0830 05/04/13 1108  WBC 10.4  --   HGB 9.2* 8.6*  HCT 27.0* 25.2*  PLT 152  --    BMET No results found for this basename: NA, K, CL, CO2, GLUCOSE, BUN, CREATININE, CALCIUM,  in the last 72 hours LFT No results found for this basename: PROT, ALBUMIN, AST, ALT, ALKPHOS, BILITOT, BILIDIR, IBILI, LIPASE,  in the last 72 hours PT/INR No results found for this basename: LABPROT, INR,  in the last 72 hours   Studies/Results: Ir Angiogram Visceral Selective  05/03/2013   CLINICAL DATA:  Upper GI hemorrhage from duodenal lesion. Lesion was previously surgically oversewn. Endoscopy showed arterial lesion and active bleeding. Continued decrease in hematocrit. Empiric gastroduodenal arterial embolization is requested by the surgical service.  TECHNIQUE: ULTRASOUND GUIDANCE FOR VASCULAR ACCESS  SELECTIVE GASTRODUODENAL ARTERY CATHETERIZATION  COIL EMBOLIZATION POST EMBOLIZATION ANGIOGRAM  Technique and findings: Informed consent was obtained from the patient following explanation of the procedure, risks, benefits and alternatives.  The patient understands, agrees and consents for the procedure. All questions were addressed. A time out was performed.No preprocedural antibiotics were indicated. Maximal barrier sterile technique utilized including caps, mask, sterile gowns, sterile gloves, large sterile drape, hand hygiene, and betadine.  Intravenous Fentanyl and Versed were administered as conscious sedation during continuous cardiorespiratory monitoring by the radiology RN, with a total  moderate sedation time of 40 minutes.  Under sterile conditions and local anesthesia, 19 gauge single wall ultrasound access performed of the right common femoral artery.  Over a [facility] guide wire, 5-French sheath was inserted. A 5- French C2 catheter was utilized to select the celiac origin. The catheter was advanced into the common hepatic artery. Selective arteriography was performed in multiple projections. Patency of the gastroduodenal artery was identified. No active extravasation was evident. No pseudoaneurysm or AV fistula. There is a large patent right gastric artery also evident from the common hepatic artery. The proper hepatic artery and intrahepatic branches grossly unremarkable. A renegade micro catheter was advanced over a 0.018 guidewire into the gastroduodenal artery. Confirmatory selective angiogram was performed.  From this location, the GDA was embolized with 3 and 4 mm interlock coils. Following embolization, a post embolization angiogram confirms no further antegrade flow into GDA branches. Right gastric and hepatic arterial flow is preserved.  The micro catheter and a 5-F catheter were removed safely.  Hemostasis was obtained at the right common femoral artery access site with an Exoseal device. No immediate complication. Distal pedal pulses remained normal. The patient tolerated the treatment well.  FLUOROSCOPY TIME:  7 min 6 seconds  IMPRESSION: Successful gastroduodenal artery coil embolization for upper GI bleed.   Electronically Signed   By: Oley Balm M.D.   On: 05/03/2013 13:06   Ir Angiogram Selective Each Additional Vessel  05/03/2013   CLINICAL DATA:  Upper GI hemorrhage from duodenal lesion. Lesion was previously surgically oversewn. Endoscopy showed arterial lesion and active bleeding. Continued decrease in hematocrit. Empiric gastroduodenal arterial embolization is requested by the surgical service.  TECHNIQUE: ULTRASOUND GUIDANCE FOR VASCULAR ACCESS  SELECTIVE  GASTRODUODENAL ARTERY CATHETERIZATION  COIL EMBOLIZATION POST EMBOLIZATION ANGIOGRAM  Technique and findings: Informed consent was obtained from the patient following explanation of the procedure, risks, benefits and alternatives.  The patient understands, agrees and consents for the procedure. All questions were addressed. A time out was performed.No preprocedural antibiotics were indicated. Maximal barrier sterile technique utilized including caps, mask, sterile gowns, sterile gloves, large sterile drape, hand hygiene, and betadine.  Intravenous Fentanyl and Versed were administered as conscious sedation during continuous cardiorespiratory monitoring by the radiology RN, with a total moderate sedation time of 40 minutes.  Under sterile conditions and local anesthesia, 19 gauge single wall ultrasound access performed of the right common femoral artery.  Over a [facility] guide wire, 5-French sheath was inserted. A 5- French C2 catheter was utilized to select the celiac origin. The catheter was advanced into the common hepatic artery. Selective arteriography was performed in multiple projections. Patency of the gastroduodenal artery was identified. No active extravasation was evident. No pseudoaneurysm or AV fistula. There is a large patent right gastric artery also evident from the common hepatic artery. The proper hepatic artery and intrahepatic branches grossly unremarkable. A renegade micro catheter was advanced over a 0.018 guidewire into the gastroduodenal artery. Confirmatory selective angiogram was performed.  From this location, the GDA was embolized with 3 and 4 mm interlock coils. Following embolization, a post embolization angiogram confirms no further antegrade flow into GDA branches. Right gastric and hepatic arterial flow is preserved.  The micro catheter and a 5-F catheter were removed safely.  Hemostasis was obtained at the right common femoral artery access site with an Exoseal device. No immediate  complication. Distal pedal pulses remained normal. The patient tolerated the treatment well.  FLUOROSCOPY TIME:  7 min 6 seconds  IMPRESSION: Successful gastroduodenal artery coil embolization for upper GI bleed.   Electronically Signed   By: Oley Balm M.D.   On: 05/03/2013 13:06   Ir US Guide Vasc Access Right  05/03/2013   CLINICAL DATA:  Upper GI hemorrhage from duodenal lesion. Lesion was previously surgically oversewn. Endoscopy showed arterial lesion and active bleeding. Continued decrease in hematocrit. Empiric gastroduodenal arterial embolization is requested by the surgical service.  TECHNIQUE: ULTRASOUND GUIDANCE FOR VASCULAR ACCESS  SELECTIVE GASTRODUODENAL ARTERY CATHETERIZATION  COIL EMBOLIZATION POST EMBOLIZATION ANGIOGRAM  Technique and findings: Informed consent was obtained from the patient following explanation of the procedure, risks, benefits and alternatives.  The patient understands, agrees and consents for the procedure. All questions were addressed. A time out was performed.No preprocedural antibiotics were indicated. Maximal barrier sterile technique utilized including caps, mask, sterile gowns, sterile gloves, large sterile drape, hand hygiene, and betadine.  Intravenous Fentanyl and Versed were administered as conscious sedation during continuous cardiorespiratory monitoring by the radiology RN, with a total moderate sedation time of 40 minutes.  Under sterile conditions and local anesthesia, 19 gauge single wall ultrasound access performed of the right common femoral artery.  Over a [facility] guide wire, 5-French sheath was inserted. A 5- French C2 catheter was utilized to select the celiac origin. The catheter was advanced into the common hepatic artery. Selective arteriography was performed in multiple projections. Patency of the gastroduodenal artery was identified. No active extravasation was evident. No pseudoaneurysm or AV fistula. There is a large patent right gastric artery  also evident from the common hepatic artery. The proper hepatic artery and intrahepatic branches grossly unremarkable. A renegade micro catheter was advanced over  a 0.018 guidewire into the gastroduodenal artery. Confirmatory selective angiogram was performed.  From this location, the GDA was embolized with 3 and 4 mm interlock coils. Following embolization, a post embolization angiogram confirms no further antegrade flow into GDA branches. Right gastric and hepatic arterial flow is preserved.  The micro catheter and a 5-F catheter were removed safely.  Hemostasis was obtained at the right common femoral artery access site with an Exoseal device. No immediate complication. Distal pedal pulses remained normal. The patient tolerated the treatment well.  FLUOROSCOPY TIME:  7 min 6 seconds  IMPRESSION: Successful gastroduodenal artery coil embolization for upper GI bleed.   Electronically Signed   By: Oley Balm M.D.   On: 05/03/2013 13:06   Ir Embo Art  Peter [facility] [employer] Guide Roadmapping  05/03/2013   CLINICAL DATA:  Upper GI hemorrhage from duodenal lesion. Lesion was previously surgically oversewn. Endoscopy showed arterial lesion and active bleeding. Continued decrease in hematocrit. Empiric gastroduodenal arterial embolization is requested by the surgical service.  TECHNIQUE: ULTRASOUND GUIDANCE FOR VASCULAR ACCESS  SELECTIVE GASTRODUODENAL ARTERY CATHETERIZATION  COIL EMBOLIZATION POST EMBOLIZATION ANGIOGRAM  Technique and findings: Informed consent was obtained from the patient following explanation of the procedure, risks, benefits and alternatives.  The patient understands, agrees and consents for the procedure. All questions were addressed. A time out was performed.No preprocedural antibiotics were indicated. Maximal barrier sterile technique utilized including caps, mask, sterile gowns, sterile gloves, large sterile drape, hand hygiene, and betadine.  Intravenous Fentanyl and Versed were  administered as conscious sedation during continuous cardiorespiratory monitoring by the radiology RN, with a total moderate sedation time of 40 minutes.  Under sterile conditions and local anesthesia, 19 gauge single wall ultrasound access performed of the right common femoral artery.  Over a [facility] guide wire, 5-French sheath was inserted. A 5- French C2 catheter was utilized to select the celiac origin. The catheter was advanced into the common hepatic artery. Selective arteriography was performed in multiple projections. Patency of the gastroduodenal artery was identified. No active extravasation was evident. No pseudoaneurysm or AV fistula. There is a large patent right gastric artery also evident from the common hepatic artery. The proper hepatic artery and intrahepatic branches grossly unremarkable. A renegade micro catheter was advanced over a 0.018 guidewire into the gastroduodenal artery. Confirmatory selective angiogram was performed.  From this location, the GDA was embolized with 3 and 4 mm interlock coils. Following embolization, a post embolization angiogram confirms no further antegrade flow into GDA branches. Right gastric and hepatic arterial flow is preserved.  The micro catheter and a 5-F catheter were removed safely.  Hemostasis was obtained at the right common femoral artery access site with an Exoseal device. No immediate complication. Distal pedal pulses remained normal. The patient tolerated the treatment well.  FLUOROSCOPY TIME:  7 min 6 seconds  IMPRESSION: Successful gastroduodenal artery coil embolization for upper GI bleed.   Electronically Signed   By: Oley Balm M.D.   On: 05/03/2013 13:06    Assessment/Plan: Duodenal ulcer with bleed. No obvious evidence of further bleed. S/p GDA embolization, no complications. Hgb stable. Call IR if needed further    LOS: 4 days    Brayton El PA-C 05/04/2013 12:11 PM

## 2013-05-04 NOTE — Progress Notes (Signed)
Les Longmore ZOX:[id number] DOB: 28-Sep-1960 DOA: 04/30/2013 PCP: [name], MD  Brief narrative: 52 year old ? admitted 9/292014 with severe venous, syncope after leaving AGAINST MEDICAL ADVICE 04/25/2013 [severe microcytic anemia anemia, hemoglobin 2.7, acute encephalopathy -transfused 6 units total of packed red blood cells]  transfused 4 units packed red blood cells placed on protonic drip, assessed by Dr. [name] and endoscopy showed active bleeding/oozing near to area of prior oversewed ulcer. Given patient did not seem to understand medical decision-making, psychiatry was consulted and they rendered him as being incompetent of making medical decisions, and discussion was held with his niece. Patient was IVC'd General surgery was consulted and recommended interventional radiology embolization of gastroduodenal artery, which was ultimately performed 05/03/2013. Patient has remained hemodynamically stable with hemoglobin in 8.5 range. Patient was transferred to telemetry 05/04/13  Past medical history-As per Problem list Chart reviewed as below-   Consultants:  Dr. Mann-gastroenterology  Dr. Lolly Mustache, Psychiatry  Procedures:  Endoscopy per Dr. 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 6 cc's of 1:10, 000 epinephrine was injected around the ulcer base and hemostasis was achieved. The small bowel distal to the ulcer upto 60 cm appeared normal  Mesenteric angiogram with embolization of gastroduodenal artery with no complications 05/03/13  Antibiotics:   None currently   Subjective  Doing fair.  Tolerating diet well Wants to go home No bleeding no dark stool no tarry stool   Objective    Interim History:  Reviewed  Telemetry:  Sinus rhythm  Objective: Filed Vitals:   05/04/13 0400 05/04/13 0700 05/04/13 0800 05/04/13 1200  BP: 152/90 161/84 161/84   Pulse: 70 94 87   Temp: 98.6 F (37 C)  99.2 F (37.3 C) 98.1 F (36.7 C)    TempSrc: Oral  Oral Oral  Resp: 16  22   Height:      Weight:      SpO2: 96%  97%     Intake/Output Summary (Last 24 hours) at 05/04/13 1411 Last data filed at 05/04/13 1100  Gross per 24 hour  Intake   3375 ml  Output   3525 ml  Net   -150 ml    Exam:  EOMI, NCAT Chest clinically clear S1-S2 no murmur rub or gallop Abdomen soft nontender nondistended no rebound  Data Reviewed: Basic Metabolic Panel:  Recent Labs Lab 04/28/13 0712 04/30/13 1640 04/30/13 2034 05/01/13 0635  NA 144 139  --  140  K 3.7 3.6  --  3.9  CL 113* 107  --  110  CO2 24 24  --  24  GLUCOSE 91 95  --  94  BUN 28* 24*  --  20  CREATININE 1.23 0.97  --  1.06  CALCIUM 7.9* 8.1*  --  7.7*  MG  --   --  2.0  --   PHOS  --   --  2.4  --    Liver Function Tests:  Recent Labs Lab 05/01/13 0635  AST 17  ALT 14  ALKPHOS 37*  BILITOT 0.3  PROT 4.3*  ALBUMIN 1.8*   No results found for this basename: LIPASE, AMYLASE,  in the last 168 hours  Recent Labs Lab 04/30/13 1851  AMMONIA 23   CBC:  Recent Labs Lab 04/29/13 0505 04/30/13 1640 04/30/13 2034 05/01/13 0635  05/02/13 2335 05/03/13 1110 05/03/13 2325 05/04/13 0830 05/04/13 1108  WBC 9.0 11.8* 10.4 10.3  --   --   --   --  10.4  --   NEUTROABS  --  8.7*  --   --   --   --   --   --   --   --   HGB 7.0* 5.7* 5.3* 8.8*  < > 8.3* 8.4* 8.5* 9.2* 8.6*  HCT 20.3* 16.8* 15.9* 26.1*  < > 24.0* 24.6* 24.8* 27.0* 25.2*  MCV 80.9 79.6 79.9 80.8  --   --   --   --  79.4  --   PLT 154 167 153 123*  --   --   --   --  152  --   < > = values in this interval not displayed. Cardiac Enzymes: No results found for this basename: CKTOTAL, CKMB, CKMBINDEX, TROPONINI,  in the last 168 hours BNP: No components found with this basename: POCBNP,  CBG: No results found for this basename: GLUCAP,  in the last 168 hours  Recent Results (from the past 240 hour(s))  MRSA PCR SCREENING     Status: None   Collection Time    04/25/13  6:28 PM       Result Value Range Status   MRSA by PCR NEGATIVE  NEGATIVE Final   Comment:            The GeneXpert MRSA Assay (FDA     approved for NASAL specimens     only), is one component of a     comprehensive MRSA colonization     surveillance program. It is not     intended to diagnose MRSA     infection nor to guide or     monitor treatment for     MRSA infections.  MRSA PCR SCREENING     Status: None   Collection Time    04/30/13  6:42 PM      Result Value Range Status   MRSA by PCR NEGATIVE  NEGATIVE Final   Comment:            The GeneXpert MRSA Assay (FDA     approved for NASAL specimens     only), is one component of a     comprehensive MRSA colonization     surveillance program. It is not     intended to diagnose MRSA     infection nor to guide or     monitor treatment for     MRSA infections.     Performed at [facility]  URINE CULTURE     Status: None   Collection Time    04/30/13  9:02 PM      Result Value Range Status   Specimen Description URINE, CLEAN CATCH   Final   Special Requests NONE   Final   Culture  Setup Time     Final   Value: 05/01/2013 03:01     Performed at [facility] Count     Final   Value: 15,000 COLONIES/ML     Performed at [facility]   Culture     Final   Value: Multiple bacterial morphotypes present, none predominant. Suggest appropriate recollection if clinically indicated.     Performed at [facility]   Report Status 05/02/2013 FINAL   Final     Studies:              All Imaging reviewed and is as per above notation   Scheduled Meds: . antiseptic oral rinse  15 mL Mouth Rinse BID  . feeding supplement  1 Container Oral BID BM  . folic acid  1 mg Oral Daily  . LORazepam  0-4 mg Intravenous Q12H  . multivitamin with minerals  1 tablet Oral Daily  . pantoprazole  40 mg Oral BID  . sodium chloride  3 mL Intravenous Q12H  . sucralfate  1 g Oral TID WC & HS  . thiamine  100 mg Oral Daily    Or  . thiamine  100 mg Intravenous Daily   Continuous Infusions: . sodium chloride 1,000 mL (05/04/13 1123)     Assessment/Plan: 1.  Acute severe upper GI bleed with bleeding ulcer-status post epinephrine injection 05/01/13-known history of duodenal ulcer being oversewn January 2014- high risk fo further complication-general surgery/GI input appreciated . Patient had embolization of gastroduodenal artery as above 10/3.  Continue Protonix GTt  to PO 40 bid 05/02/13, continue Carafate 1 g 3 times a day with meals, per GI can eat meal this pm 10/3 per IR's instruction.  IV fluid discontinued 10/4  Would score him as a [place] stage 3-4 2. Severe Iron deficiency anemia, probably 2/2 to #1 as acute bleeding-folate/b12 wnl-start Iron PO in 1-2 days to not confound picture 3. History of alcohol habituation-CIWA score now 0-drinks 2 40s daily Olde English beer. Willing to quit as an outpatient he says to me today 10/3 and will join AA . Discontinued Tylenol. CIWA protocol discontinued 10/4 4. Acute kidney injury-potentially secondary to effect of bleeding into the duodenum-now resolved 5. Moderate to severe malnutrition-albumin is low-nutritionist input appreciated once patient can take a full diet 6. Tob habituation-pre-contemplative 7. He doesn't have medical capacity to make decisions, per Psychiatric consult 05/01/13-appreciate input-he has been involuntarily committed-patient will potentially have this reversed and discharged on 10/5  Code Status:  Full Family Communication:  Updated 05/03/13-his healthcare power of attorney is Jobe Igo [phone] Disposition Plan: MedSurg X. one day and then discharge?  Pleas Koch, MD  [facility] Pager [phone] 05/04/2013, 2:11 PM    LOS: 4 days

## 2013-05-04 NOTE — Progress Notes (Signed)
General Surgery Note  LOS: 4 days  Room - 1238 General Surgery note  Assessment/Plan: 1.  Duodenal ulcer  GDA embolization - 05/03/2013 - D. Hassell  (seen by Drs. Mann/Hung - who have signed off)  On protonix/carafate  No planned surgery.  Patient does not understand process and will probably not follow discharge instructions.  1a.  Had oversew of duodenal ulcer in 08/09/2102 by J. Lindie Spruce.  2.  History of EtOH abuse 3.  Smokes 4.  DVT prophylaxis - on hold because of bleed 5.  Seen by psych, Dr. Sheela Stack  Subjective:  Doing okay.  Wants to go home.  Does not have any insight into his medical problem. Objective:   Filed Vitals:   05/04/13 0800  BP:   Pulse:   Temp: 99.2 F (37.3 C)  Resp:      Intake/Output from previous day:  10/03 0701 - 10/04 0700 In: 3525 [P.O.:1800; I.V.:1725] Out: 4275 [Urine:4275]  Intake/Output this shift:      Physical Exam:   General: AAM who is alert.    HEENT: Normal. Pupils equal. .   Lungs: Clear.   Abdomen: Soft.  Has well healed upper mid line incision   Lab Results:    Recent Labs  05/03/13 2325 05/04/13 0830  WBC  --  10.4  HGB 8.5* 9.2*  HCT 24.8* 27.0*  PLT  --  152    BMET  No results found for this basename: NA, K, CL, CO2, GLUCOSE, BUN, CREATININE, CALCIUM,  in the last 72 hours  PT/INR  No results found for this basename: LABPROT, INR,  in the last 72 hours  ABG  No results found for this basename: PHART, PCO2, PO2, HCO3,  in the last 72 hours   Studies/Results:  Ir Angiogram Visceral Selective  05/03/2013   CLINICAL DATA:  Upper GI hemorrhage from duodenal lesion. Lesion was previously surgically oversewn. Endoscopy showed arterial lesion and active bleeding. Continued decrease in hematocrit. Empiric gastroduodenal arterial embolization is requested by the surgical service.  TECHNIQUE: ULTRASOUND GUIDANCE FOR VASCULAR ACCESS  SELECTIVE GASTRODUODENAL ARTERY CATHETERIZATION  COIL EMBOLIZATION POST EMBOLIZATION  ANGIOGRAM  Technique and findings: Informed consent was obtained from the patient following explanation of the procedure, risks, benefits and alternatives.  The patient understands, agrees and consents for the procedure. All questions were addressed. A time out was performed.No preprocedural antibiotics were indicated. Maximal barrier sterile technique utilized including caps, mask, sterile gowns, sterile gloves, large sterile drape, hand hygiene, and betadine.  Intravenous Fentanyl and Versed were administered as conscious sedation during continuous cardiorespiratory monitoring by the radiology RN, with a total moderate sedation time of 40 minutes.  Under sterile conditions and local anesthesia, 19 gauge single wall ultrasound access performed of the right common femoral artery.  Over a [facility] guide wire, 5-French sheath was inserted. A 5- French C2 catheter was utilized to select the celiac origin. The catheter was advanced into the common hepatic artery. Selective arteriography was performed in multiple projections. Patency of the gastroduodenal artery was identified. No active extravasation was evident. No pseudoaneurysm or AV fistula. There is a large patent right gastric artery also evident from the common hepatic artery. The proper hepatic artery and intrahepatic branches grossly unremarkable. A renegade micro catheter was advanced over a 0.018 guidewire into the gastroduodenal artery. Confirmatory selective angiogram was performed.  From this location, the GDA was embolized with 3 and 4 mm interlock coils. Following embolization, a post embolization angiogram confirms no further antegrade flow  into GDA branches. Right gastric and hepatic arterial flow is preserved.  The micro catheter and a 5-F catheter were removed safely.  Hemostasis was obtained at the right common femoral artery access site with an Exoseal device. No immediate complication. Distal pedal pulses remained normal. The patient tolerated the  treatment well.  FLUOROSCOPY TIME:  7 min 6 seconds  IMPRESSION: Successful gastroduodenal artery coil embolization for upper GI bleed.   Electronically Signed   By: Oley Balm M.D.   On: 05/03/2013 13:06   Ir Angiogram Selective Each Additional Vessel  05/03/2013   CLINICAL DATA:  Upper GI hemorrhage from duodenal lesion. Lesion was previously surgically oversewn. Endoscopy showed arterial lesion and active bleeding. Continued decrease in hematocrit. Empiric gastroduodenal arterial embolization is requested by the surgical service.  TECHNIQUE: ULTRASOUND GUIDANCE FOR VASCULAR ACCESS  SELECTIVE GASTRODUODENAL ARTERY CATHETERIZATION  COIL EMBOLIZATION POST EMBOLIZATION ANGIOGRAM  Technique and findings: Informed consent was obtained from the patient following explanation of the procedure, risks, benefits and alternatives.  The patient understands, agrees and consents for the procedure. All questions were addressed. A time out was performed.No preprocedural antibiotics were indicated. Maximal barrier sterile technique utilized including caps, mask, sterile gowns, sterile gloves, large sterile drape, hand hygiene, and betadine.  Intravenous Fentanyl and Versed were administered as conscious sedation during continuous cardiorespiratory monitoring by the radiology RN, with a total moderate sedation time of 40 minutes.  Under sterile conditions and local anesthesia, 19 gauge single wall ultrasound access performed of the right common femoral artery.  Over a [facility] guide wire, 5-French sheath was inserted. A 5- French C2 catheter was utilized to select the celiac origin. The catheter was advanced into the common hepatic artery. Selective arteriography was performed in multiple projections. Patency of the gastroduodenal artery was identified. No active extravasation was evident. No pseudoaneurysm or AV fistula. There is a large patent right gastric artery also evident from the common hepatic artery. The proper  hepatic artery and intrahepatic branches grossly unremarkable. A renegade micro catheter was advanced over a 0.018 guidewire into the gastroduodenal artery. Confirmatory selective angiogram was performed.  From this location, the GDA was embolized with 3 and 4 mm interlock coils. Following embolization, a post embolization angiogram confirms no further antegrade flow into GDA branches. Right gastric and hepatic arterial flow is preserved.  The micro catheter and a 5-F catheter were removed safely.  Hemostasis was obtained at the right common femoral artery access site with an Exoseal device. No immediate complication. Distal pedal pulses remained normal. The patient tolerated the treatment well.  FLUOROSCOPY TIME:  7 min 6 seconds  IMPRESSION: Successful gastroduodenal artery coil embolization for upper GI bleed.   Electronically Signed   By: Oley Balm M.D.   On: 05/03/2013 13:06   Ir US Guide Vasc Access Right  05/03/2013   CLINICAL DATA:  Upper GI hemorrhage from duodenal lesion. Lesion was previously surgically oversewn. Endoscopy showed arterial lesion and active bleeding. Continued decrease in hematocrit. Empiric gastroduodenal arterial embolization is requested by the surgical service.  TECHNIQUE: ULTRASOUND GUIDANCE FOR VASCULAR ACCESS  SELECTIVE GASTRODUODENAL ARTERY CATHETERIZATION  COIL EMBOLIZATION POST EMBOLIZATION ANGIOGRAM  Technique and findings: Informed consent was obtained from the patient following explanation of the procedure, risks, benefits and alternatives.  The patient understands, agrees and consents for the procedure. All questions were addressed. A time out was performed.No preprocedural antibiotics were indicated. Maximal barrier sterile technique utilized including caps, mask, sterile gowns, sterile gloves, large sterile drape, hand hygiene, and  betadine.  Intravenous Fentanyl and Versed were administered as conscious sedation during continuous cardiorespiratory monitoring by  the radiology RN, with a total moderate sedation time of 40 minutes.  Under sterile conditions and local anesthesia, 19 gauge single wall ultrasound access performed of the right common femoral artery.  Over a [facility] guide wire, 5-French sheath was inserted. A 5- French C2 catheter was utilized to select the celiac origin. The catheter was advanced into the common hepatic artery. Selective arteriography was performed in multiple projections. Patency of the gastroduodenal artery was identified. No active extravasation was evident. No pseudoaneurysm or AV fistula. There is a large patent right gastric artery also evident from the common hepatic artery. The proper hepatic artery and intrahepatic branches grossly unremarkable. A renegade micro catheter was advanced over a 0.018 guidewire into the gastroduodenal artery. Confirmatory selective angiogram was performed.  From this location, the GDA was embolized with 3 and 4 mm interlock coils. Following embolization, a post embolization angiogram confirms no further antegrade flow into GDA branches. Right gastric and hepatic arterial flow is preserved.  The micro catheter and a 5-F catheter were removed safely.  Hemostasis was obtained at the right common femoral artery access site with an Exoseal device. No immediate complication. Distal pedal pulses remained normal. The patient tolerated the treatment well.  FLUOROSCOPY TIME:  7 min 6 seconds  IMPRESSION: Successful gastroduodenal artery coil embolization for upper GI bleed.   Electronically Signed   By: Oley Balm M.D.   On: 05/03/2013 13:06   Ir Embo Art  [facility] [employer] Guide Road[employer]  05/03/2013   CLINICAL DATA:  Upper GI hemorrhage from duodenal lesion. Lesion was previously surgically oversewn. Endoscopy showed arterial lesion and active bleeding. Continued decrease in hematocrit. Empiric gastroduodenal arterial embolization is requested by the surgical service.  TECHNIQUE: ULTRASOUND  GUIDANCE FOR VASCULAR ACCESS  SELECTIVE GASTRODUODENAL ARTERY CATHETERIZATION  COIL EMBOLIZATION POST EMBOLIZATION ANGIOGRAM  Technique and findings: Informed consent was obtained from the patient following explanation of the procedure, risks, benefits and alternatives.  The patient understands, agrees and consents for the procedure. All questions were addressed. A time out was performed.No preprocedural antibiotics were indicated. Maximal barrier sterile technique utilized including caps, mask, sterile gowns, sterile gloves, large sterile drape, hand hygiene, and betadine.  Intravenous Fentanyl and Versed were administered as conscious sedation during continuous cardiorespiratory monitoring by the radiology RN, with a total moderate sedation time of 40 minutes.  Under sterile conditions and local anesthesia, 19 gauge single wall ultrasound access performed of the right common femoral artery.  Over a [address] guide wire, 5-French sheath was inserted. A 5- French C2 catheter was utilized to select the celiac origin. The catheter was advanced into the common hepatic artery. Selective arteriography was performed in multiple projections. Patency of the gastroduodenal artery was identified. No active extravasation was evident. No pseudoaneurysm or AV fistula. There is a large patent right gastric artery also evident from the common hepatic artery. The proper hepatic artery and intrahepatic branches grossly unremarkable. A renegade micro catheter was advanced over a 0.018 guidewire into the gastroduodenal artery. Confirmatory selective angiogram was performed.  From this location, the GDA was embolized with 3 and 4 mm interlock coils. Following embolization, a post embolization angiogram confirms no further antegrade flow into GDA branches. Right gastric and hepatic arterial flow is preserved.  The micro catheter and a 5-F catheter were removed safely.  Hemostasis was obtained at the right common femoral artery access site  with an Exoseal device. No immediate complication. Distal pedal pulses remained normal. The patient tolerated the treatment well.  FLUOROSCOPY TIME:  7 min 6 seconds  IMPRESSION: Successful gastroduodenal artery coil embolization for upper GI bleed.   Electronically Signed   By: Oley Balm M.D.   On: 05/03/2013 13:06     Anti-infectives:   Anti-infectives   None      Ovidio Kin, MD, FACS Pager: [phone] Surgery Office: [phone] 05/04/2013

## 2013-05-05 LAB — CBC
HCT: 25.2 % — ABNORMAL LOW (ref 39.0–52.0)
Hemoglobin: 8.4 g/dL — ABNORMAL LOW (ref 13.0–17.0)
MCH: 26.4 pg (ref 26.0–34.0)
MCHC: 33.3 g/dL (ref 30.0–36.0)
MCV: 79.2 fL (ref 78.0–100.0)
RDW: 15 % (ref 11.5–15.5)

## 2013-05-05 MED ORDER — SUCRALFATE 1 GM/10ML PO SUSP: 1.0000 g | Freq: Three times a day (TID) | ORAL | Status: DC

## 2013-05-05 MED ORDER — ADULT MULTIVITAMIN W/MINERALS CH: 1.0000 | ORAL_TABLET | Freq: Every day | ORAL | Status: DC

## 2013-05-05 MED ORDER — PANTOPRAZOLE SODIUM 40 MG PO TBEC: 40.0000 mg | DELAYED_RELEASE_TABLET | Freq: Two times a day (BID) | ORAL | Status: DC

## 2013-05-05 NOTE — Progress Notes (Signed)
General Surgery Note  LOS: 5 days  Room - 1238 General Surgery note  Assessment/Plan: 1.  Duodenal ulcer  GDA embolization - 05/03/2013 - D. Hassell  (seen by Drs. Mann/Hung - who have signed off)  On protonix/carafate  It is okay for the patient to go home from our standpoint.  I discussed with him the link between smoking, drinking, and ulcer disease.  Whether this will make any difference in his behavior is unlikely.  1a.  Had oversew of duodenal ulcer in 08/09/2102 by J. Lindie Spruce.  2.  History of EtOH abuse 3.  Smokes 4.  DVT prophylaxis - on hold because of bleed 5.  Seen by psych, Dr. Sheela Stack  Subjective:  Doing okay.  Wants to go home. He has some people that he lives with are going to pick him up.  Does not have any insight into his medical problem. Objective:   Filed Vitals:   05/05/13 0524  BP: 142/86  Pulse: 70  Temp: 99 F (37.2 C)  Resp: 16     Intake/Output from previous day:  10/04 0701 - 10/05 0700 In: 2520 [P.O.:1920; I.V.:600] Out: 2200 [Urine:2200]  Intake/Output this shift:      Physical Exam:   General: AAM who is alert.    HEENT: Normal. Pupils equal. .   Lungs: Clear.   Abdomen: Soft.  Has well healed upper mid line incision   Lab Results:    Recent Labs  05/04/13 0830  05/04/13 2305 05/05/13 0350  WBC 10.4  --   --  9.0  HGB 9.2*  < > 8.8* 8.4*  HCT 27.0*  < > 26.1* 25.2*  PLT 152  --   --  143*  < > = values in this interval not displayed.  BMET  No results found for this basename: NA, K, CL, CO2, GLUCOSE, BUN, CREATININE, CALCIUM,  in the last 72 hours  PT/INR  No results found for this basename: LABPROT, INR,  in the last 72 hours  ABG  No results found for this basename: PHART, PCO2, PO2, HCO3,  in the last 72 hours   Studies/Results:  Ir Angiogram Visceral Selective  05/03/2013   CLINICAL DATA:  Upper GI hemorrhage from duodenal lesion. Lesion was previously surgically oversewn. Endoscopy showed arterial lesion and active  bleeding. Continued decrease in hematocrit. Empiric gastroduodenal arterial embolization is requested by the surgical service.  TECHNIQUE: ULTRASOUND GUIDANCE FOR VASCULAR ACCESS  SELECTIVE GASTRODUODENAL ARTERY CATHETERIZATION  COIL EMBOLIZATION POST EMBOLIZATION ANGIOGRAM  Technique and findings: Informed consent was obtained from the patient following explanation of the procedure, risks, benefits and alternatives.  The patient understands, agrees and consents for the procedure. All questions were addressed. A time out was performed.No preprocedural antibiotics were indicated. Maximal barrier sterile technique utilized including caps, mask, sterile gowns, sterile gloves, large sterile drape, hand hygiene, and betadine.  Intravenous Fentanyl and Versed were administered as conscious sedation during continuous cardiorespiratory monitoring by the radiology RN, with a total moderate sedation time of 40 minutes.  Under sterile conditions and local anesthesia, 19 gauge single wall ultrasound access performed of the right common femoral artery.  Over a [facility] guide wire, 5-French sheath was inserted. A 5- French C2 catheter was utilized to select the celiac origin. The catheter was advanced into the common hepatic artery. Selective arteriography was performed in multiple projections. Patency of the gastroduodenal artery was identified. No active extravasation was evident. No pseudoaneurysm or AV fistula. There is a large patent right gastric  artery also evident from the common hepatic artery. The proper hepatic artery and intrahepatic branches grossly unremarkable. A renegade micro catheter was advanced over a 0.018 guidewire into the gastroduodenal artery. Confirmatory selective angiogram was performed.  From this location, the GDA was embolized with 3 and 4 mm interlock coils. Following embolization, a post embolization angiogram confirms no further antegrade flow into GDA branches. Right gastric and hepatic arterial  flow is preserved.  The micro catheter and a 5-F catheter were removed safely.  Hemostasis was obtained at the right common femoral artery access site with an Exoseal device. No immediate complication. Distal pedal pulses remained normal. The patient tolerated the treatment well.  FLUOROSCOPY TIME:  7 min 6 seconds  IMPRESSION: Successful gastroduodenal artery coil embolization for upper GI bleed.   Electronically Signed   By: Oley Balm M.D.   On: 05/03/2013 13:06    Anti-infectives:   Anti-infectives   None      Ovidio Kin, MD, FACS Pager: [phone] Surgery Office: [phone] 05/05/2013

## 2013-05-05 NOTE — Progress Notes (Signed)
Asked by MD to rescind IVC.  Completed Examination and Recommendation document, obtained MD's signature and faxed to the [facility].  Fax confirmation received.  Providence Crosby, LCSWA Clinical Social Work [phone]

## 2013-05-05 NOTE — Progress Notes (Signed)
We have been waiting for a friend /family member to come pick up patient,when I went to patient's room he wasn't there. He mentioned earlier to me that he will take the bus when he can't wait for his ride,I said to stay here and wait.But he left the hospital without telling anybody.AC was notified. All important informations given to patient this am and verbalized understanding.No c/o pain this am and was stable, had good appetite and was able to eat lunch.Hulda Marin RN

## 2013-05-05 NOTE — Progress Notes (Signed)
Patient's d/c instructions given to patient, verbalized understanding.- Hulda Marin RN

## 2013-05-05 NOTE — Discharge Summary (Signed)
Physician Discharge Summary  [name] ZOX:[id number] DOB: 28-Feb-1961 DOA: 04/30/2013  PCP: Ryan Plum, MD  Admit date: 04/30/2013 Discharge date: 05/05/2013  Time spent: 45 minutes  Recommendations for Outpatient Follow-up:  1. Patient recommended absolutely to desist from drinking ( 2. Patient recommended to continue PPI and Carafate  Discharge Diagnoses:  Principal Problem:   GIB (gastrointestinal bleeding) Active Problems:   Anemia due to GI blood loss   Hypertension   Alcohol abuse   Encephalopathy acute   Anemia   Acute encephalopathy   Leukocytosis   Microcytic anemia   Syncope   Discharge Condition: Fair  Diet recommendation: Regular heart healthy  Filed Weights   05/03/13 0500 05/04/13 0324 05/04/13 1540  Weight: 72.9 kg (160 lb 11.5 oz) 78.3 kg (172 lb 9.9 oz) 73.6 kg (162 lb 4.1 oz)    History of present illness:  52 year old ? admitted 9/292014 with severe venous, syncope after leaving AGAINST MEDICAL ADVICE 04/25/2013 [severe microcytic anemia anemia, hemoglobin 2.7, acute encephalopathy -transfused 6 units total of packed red blood cells]  transfused 4 units packed red blood cells placed on protonic drip, assessed by Dr. [name] and endoscopy showed active bleeding/oozing near to area of prior oversewed ulcer.  Given patient did not seem to understand medical decision-making, psychiatry was consulted and they rendered him as being incompetent of making medical decisions, and discussion was held with his niece. Patient was IVC'd  General surgery was consulted and recommended interventional radiology embolization of gastroduodenal artery, which was ultimately performed 05/03/2013.  Patient has remained hemodynamically stable with hemoglobin in 8.5 range.  Patient was transferred to telemetry 05/04/13   Hospital Course:   1. 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 Patient had embolization of gastroduodenal artery as above 10/3.  I have mentioned to him the life threatening nature of his GI bleed and the fact that he should never drink again- Continue Protonix GTt to PO 40 bid 05/02/13, continue Carafate 1 g 3 times a day with meals instruction. IV fluid discontinued 10/4  2. Severe Iron deficiency anemia, probably 2/2 to #1 as acute bleeding-folate/b12 wnl-start Iron PO as an outpatient 3. History of alcohol habituation-CIWA score now 0-drinks 2 40s daily Olde English beer. Willing to quit as an outpatient he says to me today 10/3 and will join AA . Discontinued Tylenol. CIWA protocol discontinued 10/4 4. Acute kidney injury-potentially secondary to effect of bleeding into the duodenum-now resolved 5. Moderate to severe malnutrition-albumin is low-nutritionist input appreciated once patient can take a full diet 6. Tob habituation-pre-contemplative 7. He doesn't have medical capacity to make decisions, per Psychiatric consult 05/01/13-appreciate input-he has been involuntarily committed-this was recindeded on 05/06/13   Consultants:  Dr. Mann-gastroenterology  Dr. Lolly Mustache, Psychiatry General surgery Procedures:  Endoscopy per Dr. 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 6 cc's of 1:10, 000 epinephrine was injected around the ulcer base and hemostasis was achieved. The small bowel distal to the ulcer upto 60 cm appeared normal  Mesenteric angiogram with embolization of gastroduodenal artery with no complications 05/03/13 Antibiotics:  None currently   Discharge Exam: Filed Vitals:   05/05/13 0524  BP: 142/86  Pulse: 70  Temp: 99 F (37.2 C)  Resp: 16    General: Alert pleasant oriented Cardiovascular: S1-S2 no murmur rub or gallop Respiratory: Clear  Discharge Instructions  Discharge Orders  Future Orders  Complete By Expires   Diet - low sodium heart healthy  As directed    Increase activity slowly  As directed        Medication List         multivitamin with minerals Tabs tablet  Take 1 tablet by mouth daily.     pantoprazole 40 MG tablet  Commonly known as:  PROTONIX  Take 1 tablet (40 mg total) by mouth 2 (two) times daily.     sucralfate 1 GM/10ML suspension  Commonly known as:  CARAFATE  Take 10 mLs (1 g total) by mouth 4 (four) times daily -  with meals and at bedtime.       Allergies  Allergen Reactions  . Chocolate Nausea And Vomiting  . Peanut-Containing Drug Products Nausea And Vomiting  . Spinach Nausea And Vomiting      The results of significant diagnostics from this hospitalization (including imaging, microbiology, ancillary and laboratory) are listed below for reference.    Significant Diagnostic Studies: Ct Head Wo Contrast  04/30/2013   CLINICAL DATA:  Syncope and weakness.  EXAM: CT HEAD WITHOUT CONTRAST  TECHNIQUE: Contiguous axial images were obtained from the base of the skull through the vertex without intravenous contrast.  COMPARISON:  04/25/2013.  FINDINGS: The ventricles are normal in size and configuration. No extra-axial fluid collections are identified. The gray-white differentiation is normal. No CT findings for acute intracranial process such as hemorrhage or infarction. No mass lesions. The brainstem and cerebellum are grossly normal.  The bony structures are intact. The paranasal sinuses and mastoid air cells are clear. Evidence of prior sinonasal surgery. The globes are intact.  IMPRESSION: No acute intracranial findings or mass lesion.   Electronically Signed   By: Loralie Champagne M.D.   On: 04/30/2013 22:35   Ct Head Wo Contrast  04/25/2013   CLINICAL DATA:  Confusion, lethargy, altered mental status  EXAM: CT HEAD WITHOUT CONTRAST  TECHNIQUE: Contiguous axial images were obtained from the base of the skull through the vertex without  intravenous contrast.  COMPARISON:  None.  FINDINGS: No skull fracture is noted. Paranasal sinuses and mastoid air cells are unremarkable.  No intracranial hemorrhage, mass effect or midline shift.  No hydrocephalus. The gray and white-matter differentiation is preserved.  No acute infarction. No mass lesion is noted on this unenhanced scan. No intra or extra-axial fluid collection.  IMPRESSION: No acute intracranial abnormality.   Electronically Signed   By: Natasha Mead   On: 04/25/2013 15:12   Ir Angiogram Visceral Selective  05/03/2013   CLINICAL DATA:  Upper GI hemorrhage from duodenal lesion. Lesion was previously surgically oversewn. Endoscopy showed arterial lesion and active bleeding. Continued decrease in hematocrit. Empiric gastroduodenal arterial embolization is requested by the surgical service.  TECHNIQUE: ULTRASOUND GUIDANCE FOR VASCULAR ACCESS  SELECTIVE GASTRODUODENAL ARTERY CATHETERIZATION  COIL EMBOLIZATION POST EMBOLIZATION ANGIOGRAM  Technique and findings: Informed consent was obtained from the patient following explanation of the procedure, risks, benefits and alternatives.  The patient understands, agrees and consents for the procedure. All questions were addressed. A time out was performed.No preprocedural antibiotics were indicated. Maximal barrier sterile technique utilized including caps, mask, sterile gowns, sterile gloves, large sterile drape, hand hygiene, and betadine.  Intravenous Fentanyl and Versed were administered as conscious sedation during continuous cardiorespiratory monitoring by the radiology RN, with a total moderate sedation time of 40 minutes.  Under sterile conditions and local anesthesia, 19 gauge single wall ultrasound access performed of  the right common femoral artery.  Over a [facility] guide wire, 5-French sheath was inserted. A 5- French C2 catheter was utilized to select the celiac origin. The catheter was advanced into the common hepatic artery. Selective  arteriography was performed in multiple projections. Patency of the gastroduodenal artery was identified. No active extravasation was evident. No pseudoaneurysm or AV fistula. There is a large patent right gastric artery also evident from the common hepatic artery. The proper hepatic artery and intrahepatic branches grossly unremarkable. A renegade micro catheter was advanced over a 0.018 guidewire into the gastroduodenal artery. Confirmatory selective angiogram was performed.  From this location, the GDA was embolized with 3 and 4 mm interlock coils. Following embolization, a post embolization angiogram confirms no further antegrade flow into GDA branches. Right gastric and hepatic arterial flow is preserved.  The micro catheter and a 5-F catheter were removed safely.  Hemostasis was obtained at the right common femoral artery access site with an Exoseal device. No immediate complication. Distal pedal pulses remained normal. The patient tolerated the treatment well.  FLUOROSCOPY TIME:  7 min 6 seconds  IMPRESSION: Successful gastroduodenal artery coil embolization for upper GI bleed.   Electronically Signed   By: Oley Balm M.D.   On: 05/03/2013 13:06   Ir Angiogram Selective Each Additional Vessel  05/03/2013   CLINICAL DATA:  Upper GI hemorrhage from duodenal lesion. Lesion was previously surgically oversewn. Endoscopy showed arterial lesion and active bleeding. Continued decrease in hematocrit. Empiric gastroduodenal arterial embolization is requested by the surgical service.  TECHNIQUE: ULTRASOUND GUIDANCE FOR VASCULAR ACCESS  SELECTIVE GASTRODUODENAL ARTERY CATHETERIZATION  COIL EMBOLIZATION POST EMBOLIZATION ANGIOGRAM  Technique and findings: Informed consent was obtained from the patient following explanation of the procedure, risks, benefits and alternatives.  The patient understands, agrees and consents for the procedure. All questions were addressed. A time out was performed.No preprocedural  antibiotics were indicated. Maximal barrier sterile technique utilized including caps, mask, sterile gowns, sterile gloves, large sterile drape, hand hygiene, and betadine.  Intravenous Fentanyl and Versed were administered as conscious sedation during continuous cardiorespiratory monitoring by the radiology RN, with a total moderate sedation time of 40 minutes.  Under sterile conditions and local anesthesia, 19 gauge single wall ultrasound access performed of the right common femoral artery.  Over a [facility] guide wire, 5-French sheath was inserted. A 5- French C2 catheter was utilized to select the celiac origin. The catheter was advanced into the common hepatic artery. Selective arteriography was performed in multiple projections. Patency of the gastroduodenal artery was identified. No active extravasation was evident. No pseudoaneurysm or AV fistula. There is a large patent right gastric artery also evident from the common hepatic artery. The proper hepatic artery and intrahepatic branches grossly unremarkable. A renegade micro catheter was advanced over a 0.018 guidewire into the gastroduodenal artery. Confirmatory selective angiogram was performed.  From this location, the GDA was embolized with 3 and 4 mm interlock coils. Following embolization, a post embolization angiogram confirms no further antegrade flow into GDA branches. Right gastric and hepatic arterial flow is preserved.  The micro catheter and a 5-F catheter were removed safely.  Hemostasis was obtained at the right common femoral artery access site with an Exoseal device. No immediate complication. Distal pedal pulses remained normal. The patient tolerated the treatment well.  FLUOROSCOPY TIME:  7 min 6 seconds  IMPRESSION: Successful gastroduodenal artery coil embolization for upper GI bleed.   Electronically Signed   By: Kerry Kass.D.  On: 05/03/2013 13:06   Ir US Guide Vasc Access Right  05/03/2013   CLINICAL DATA:  Upper GI hemorrhage  from duodenal lesion. Lesion was previously surgically oversewn. Endoscopy showed arterial lesion and active bleeding. Continued decrease in hematocrit. Empiric gastroduodenal arterial embolization is requested by the surgical service.  TECHNIQUE: ULTRASOUND GUIDANCE FOR VASCULAR ACCESS  SELECTIVE GASTRODUODENAL ARTERY CATHETERIZATION  COIL EMBOLIZATION POST EMBOLIZATION ANGIOGRAM  Technique and findings: Informed consent was obtained from the patient following explanation of the procedure, risks, benefits and alternatives.  The patient understands, agrees and consents for the procedure. All questions were addressed. A time out was performed.No preprocedural antibiotics were indicated. Maximal barrier sterile technique utilized including caps, mask, sterile gowns, sterile gloves, large sterile drape, hand hygiene, and betadine.  Intravenous Fentanyl and Versed were administered as conscious sedation during continuous cardiorespiratory monitoring by the radiology RN, with a total moderate sedation time of 40 minutes.  Under sterile conditions and local anesthesia, 19 gauge single wall ultrasound access performed of the right common femoral artery.  Over a [facility] guide wire, 5-French sheath was inserted. A 5- French C2 catheter was utilized to select the celiac origin. The catheter was advanced into the common hepatic artery. Selective arteriography was performed in multiple projections. Patency of the gastroduodenal artery was identified. No active extravasation was evident. No pseudoaneurysm or AV fistula. There is a large patent right gastric artery also evident from the common hepatic artery. The proper hepatic artery and intrahepatic branches grossly unremarkable. A renegade micro catheter was advanced over a 0.018 guidewire into the gastroduodenal artery. Confirmatory selective angiogram was performed.  From this location, the GDA was embolized with 3 and 4 mm interlock coils. Following embolization, a post  embolization angiogram confirms no further antegrade flow into GDA branches. Right gastric and hepatic arterial flow is preserved.  The micro catheter and a 5-F catheter were removed safely.  Hemostasis was obtained at the right common femoral artery access site with an Exoseal device. No immediate complication. Distal pedal pulses remained normal. The patient tolerated the treatment well.  FLUOROSCOPY TIME:  7 min 6 seconds  IMPRESSION: Successful gastroduodenal artery coil embolization for upper GI bleed.   Electronically Signed   By: Oley Balm M.D.   On: 05/03/2013 13:06   Dg Chest Port 1 View  04/30/2013   CLINICAL DATA:  Leukocytosis and weakness.  EXAM: PORTABLE CHEST - 1 VIEW  COMPARISON:  03/23/2013  FINDINGS: The heart size and mediastinal contours are within normal limits. Stable bilateral parenchymal scarring present. There is atelectasis at the right lung base. The visualized skeletal structures are unremarkable.  IMPRESSION: No focal infiltrate identified. Atelectasis present at the right lung base.   Electronically Signed   By: Irish Lack   On: 04/30/2013 19:16   Dg Abd Portable 1v  04/25/2013   CLINICAL DATA:  Abdominal pain.  EXAM: PORTABLE ABDOMEN - 1 VIEW  COMPARISON:  03/23/2013.  FINDINGS: Motion blurring. Grossly normal bowel gas pattern. Moderate amount of stool in the rectum. Air in the penile urethra. Normal appearing bones.  IMPRESSION: 1. Moderate stool in the rectum. 2. Air in the penile urethra, of unknown significance. 3. Otherwise, unremarkable examination.   Electronically Signed   By: Gordan Payment   On: 04/25/2013 19:38   Ir Rebekah [place] Hemorr Lymph [employer] Guide Roadmapping  05/03/2013   CLINICAL DATA:  Upper GI hemorrhage from duodenal lesion. Lesion was previously surgically oversewn. Endoscopy showed arterial lesion and  active bleeding. Continued decrease in hematocrit. Empiric gastroduodenal arterial embolization is requested by the surgical service.   TECHNIQUE: ULTRASOUND GUIDANCE FOR VASCULAR ACCESS  SELECTIVE GASTRODUODENAL ARTERY CATHETERIZATION  COIL EMBOLIZATION POST EMBOLIZATION ANGIOGRAM  Technique and findings: Informed consent was obtained from the patient following explanation of the procedure, risks, benefits and alternatives.  The patient understands, agrees and consents for the procedure. All questions were addressed. A time out was performed.No preprocedural antibiotics were indicated. Maximal barrier sterile technique utilized including caps, mask, sterile gowns, sterile gloves, large sterile drape, hand hygiene, and betadine.  Intravenous Fentanyl and Versed were administered as conscious sedation during continuous cardiorespiratory monitoring by the radiology RN, with a total moderate sedation time of 40 minutes.  Under sterile conditions and local anesthesia, 19 gauge single wall ultrasound access performed of the right common femoral artery.  Over a [facility] guide wire, 5-French sheath was inserted. A 5- French C2 catheter was utilized to select the celiac origin. The catheter was advanced into the common hepatic artery. Selective arteriography was performed in multiple projections. Patency of the gastroduodenal artery was identified. No active extravasation was evident. No pseudoaneurysm or AV fistula. There is a large patent right gastric artery also evident from the common hepatic artery. The proper hepatic artery and intrahepatic branches grossly unremarkable. A renegade micro catheter was advanced over a 0.018 guidewire into the gastroduodenal artery. Confirmatory selective angiogram was performed.  From this location, the GDA was embolized with 3 and 4 mm interlock coils. Following embolization, a post embolization angiogram confirms no further antegrade flow into GDA branches. Right gastric and hepatic arterial flow is preserved.  The micro catheter and a 5-F catheter were removed safely.  Hemostasis was obtained at the right common  femoral artery access site with an Exoseal device. No immediate complication. Distal pedal pulses remained normal. The patient tolerated the treatment well.  FLUOROSCOPY TIME:  7 min 6 seconds  IMPRESSION: Successful gastroduodenal artery coil embolization for upper GI bleed.   Electronically Signed   By: Oley Balm M.D.   On: 05/03/2013 13:06    Microbiology: Recent Results (from the past 240 hour(s))  MRSA PCR SCREENING     Status: None   Collection Time    04/25/13  6:28 PM      Result Value Range Status   MRSA by PCR NEGATIVE  NEGATIVE Final   Comment:            The GeneXpert MRSA Assay (FDA     approved for NASAL specimens     only), is one component of a     comprehensive MRSA colonization     surveillance program. It is not     intended to diagnose MRSA     infection nor to guide or     monitor treatment for     MRSA infections.  MRSA PCR SCREENING     Status: None   Collection Time    04/30/13  6:42 PM      Result Value Range Status   MRSA by PCR NEGATIVE  NEGATIVE Final   Comment:            The GeneXpert MRSA Assay (FDA     approved for NASAL specimens     only), is one component of a     comprehensive MRSA colonization     surveillance program. It is not     intended to diagnose MRSA     infection nor to guide or  monitor treatment for     MRSA infections.     Performed at [facility]  URINE CULTURE     Status: None   Collection Time    04/30/13  9:02 PM      Result Value Range Status   Specimen Description URINE, CLEAN CATCH   Final   Special Requests NONE   Final   Culture  Setup Time     Final   Value: 05/01/2013 03:01     Performed at [facility] Count     Final   Value: 15,000 COLONIES/ML     Performed at [facility]   Culture     Final   Value: Multiple bacterial morphotypes present, none predominant. Suggest appropriate recollection if clinically indicated.     Performed at [facility]   Report  Status 05/02/2013 FINAL   Final     Labs: Basic Metabolic Panel:  Recent Labs Lab 04/30/13 1640 04/30/13 2034 05/01/13 0635  NA 139  --  140  K 3.6  --  3.9  CL 107  --  110  CO2 24  --  24  GLUCOSE 95  --  94  BUN 24*  --  20  CREATININE 0.97  --  1.06  CALCIUM 8.1*  --  7.7*  MG  --  2.0  --   PHOS  --  2.4  --    Liver Function Tests:  Recent Labs Lab 05/01/13 0635  AST 17  ALT 14  ALKPHOS 37*  BILITOT 0.3  PROT 4.3*  ALBUMIN 1.8*   No results found for this basename: LIPASE, AMYLASE,  in the last 168 hours  Recent Labs Lab 04/30/13 1851  AMMONIA 23   CBC:  Recent Labs Lab 04/30/13 1640 04/30/13 2034 05/01/13 0635  05/03/13 2325 05/04/13 0830 05/04/13 1108 05/04/13 2305 05/05/13 0350  WBC 11.8* 10.4 10.3  --   --  10.4  --   --  9.0  NEUTROABS 8.7*  --   --   --   --   --   --   --   --   HGB 5.7* 5.3* 8.8*  < > 8.5* 9.2* 8.6* 8.8* 8.4*  HCT 16.8* 15.9* 26.1*  < > 24.8* 27.0* 25.2* 26.1* 25.2*  MCV 79.6 79.9 80.8  --   --  79.4  --   --  79.2  PLT 167 153 123*  --   --  152  --   --  143*  < > = values in this interval not displayed. Cardiac Enzymes: No results found for this basename: CKTOTAL, CKMB, CKMBINDEX, TROPONINI,  in the last 168 hours BNP: BNP (last 3 results) No results found for this basename: PROBNP,  in the last 8760 hours CBG: No results found for this basename: GLUCAP,  in the last 168 hours     Signed:  Rhetta Mura  [facility] 05/05/2013, 1:54 PM

## 2013-05-10 ENCOUNTER — Encounter (HOSPITAL_COMMUNITY): Payer: Self-pay | Admitting: Emergency Medicine

## 2013-05-10 ENCOUNTER — Inpatient Hospital Stay (HOSPITAL_COMMUNITY)
Admission: EM | Admit: 2013-05-10 | Discharge: 2013-05-17 | DRG: 377 | Disposition: A | Discharge status: Home / Self Care | Payer: Medicaid Other | Attending: Internal Medicine | Admitting: Internal Medicine

## 2013-05-10 DIAGNOSIS — K269 Duodenal ulcer, unspecified as acute or chronic, without hemorrhage or perforation: Secondary | ICD-10-CM

## 2013-05-10 DIAGNOSIS — G609 Hereditary and idiopathic neuropathy, unspecified: Secondary | ICD-10-CM | POA: Diagnosis present

## 2013-05-10 DIAGNOSIS — Z9119 Patient's noncompliance with other medical treatment and regimen: Secondary | ICD-10-CM

## 2013-05-10 DIAGNOSIS — F1010 Alcohol abuse, uncomplicated: Secondary | ICD-10-CM | POA: Diagnosis present

## 2013-05-10 DIAGNOSIS — E46 Unspecified protein-calorie malnutrition: Secondary | ICD-10-CM | POA: Diagnosis present

## 2013-05-10 DIAGNOSIS — I10 Essential (primary) hypertension: Secondary | ICD-10-CM

## 2013-05-10 DIAGNOSIS — F10939 Alcohol use, unspecified with withdrawal, unspecified: Secondary | ICD-10-CM | POA: Diagnosis present

## 2013-05-10 DIAGNOSIS — D62 Acute posthemorrhagic anemia: Secondary | ICD-10-CM | POA: Diagnosis present

## 2013-05-10 DIAGNOSIS — F10239 Alcohol dependence with withdrawal, unspecified: Secondary | ICD-10-CM | POA: Diagnosis present

## 2013-05-10 DIAGNOSIS — K219 Gastro-esophageal reflux disease without esophagitis: Secondary | ICD-10-CM | POA: Diagnosis present

## 2013-05-10 DIAGNOSIS — R55 Syncope and collapse: Secondary | ICD-10-CM | POA: Diagnosis present

## 2013-05-10 DIAGNOSIS — D500 Iron deficiency anemia secondary to blood loss (chronic): Secondary | ICD-10-CM | POA: Diagnosis present

## 2013-05-10 DIAGNOSIS — Z9889 Other specified postprocedural states: Secondary | ICD-10-CM

## 2013-05-10 DIAGNOSIS — Z91199 Patient's noncompliance with other medical treatment and regimen due to unspecified reason: Secondary | ICD-10-CM

## 2013-05-10 DIAGNOSIS — D72829 Elevated white blood cell count, unspecified: Secondary | ICD-10-CM

## 2013-05-10 DIAGNOSIS — K264 Chronic or unspecified duodenal ulcer with hemorrhage: Principal | ICD-10-CM | POA: Diagnosis present

## 2013-05-10 DIAGNOSIS — D509 Iron deficiency anemia, unspecified: Secondary | ICD-10-CM

## 2013-05-10 DIAGNOSIS — F3289 Other specified depressive episodes: Secondary | ICD-10-CM | POA: Diagnosis present

## 2013-05-10 DIAGNOSIS — E8809 Other disorders of plasma-protein metabolism, not elsewhere classified: Secondary | ICD-10-CM | POA: Diagnosis present

## 2013-05-10 DIAGNOSIS — J811 Chronic pulmonary edema: Secondary | ICD-10-CM | POA: Diagnosis present

## 2013-05-10 DIAGNOSIS — K922 Gastrointestinal hemorrhage, unspecified: Secondary | ICD-10-CM | POA: Diagnosis present

## 2013-05-10 DIAGNOSIS — F329 Major depressive disorder, single episode, unspecified: Secondary | ICD-10-CM | POA: Diagnosis present

## 2013-05-10 DIAGNOSIS — D649 Anemia, unspecified: Secondary | ICD-10-CM

## 2013-05-10 DIAGNOSIS — E861 Hypovolemia: Secondary | ICD-10-CM | POA: Diagnosis present

## 2013-05-10 DIAGNOSIS — J9600 Acute respiratory failure, unspecified whether with hypoxia or hypercapnia: Secondary | ICD-10-CM

## 2013-05-10 DIAGNOSIS — F1020 Alcohol dependence, uncomplicated: Secondary | ICD-10-CM | POA: Diagnosis present

## 2013-05-10 DIAGNOSIS — F17200 Nicotine dependence, unspecified, uncomplicated: Secondary | ICD-10-CM | POA: Diagnosis present

## 2013-05-10 DIAGNOSIS — R578 Other shock: Secondary | ICD-10-CM | POA: Diagnosis present

## 2013-05-10 DIAGNOSIS — G9340 Encephalopathy, unspecified: Secondary | ICD-10-CM | POA: Diagnosis not present

## 2013-05-10 LAB — CBC WITH DIFFERENTIAL/PLATELET
Basophils Absolute: 0 10*3/uL (ref 0.0–0.1)
Basophils Relative: 0 % (ref 0–1)
Eosinophils Relative: 1 % (ref 0–5)
HCT: 13.1 % — ABNORMAL LOW (ref 39.0–52.0)
Hemoglobin: 4.2 g/dL — CL (ref 13.0–17.0)
Lymphs Abs: 1.5 10*3/uL (ref 0.7–4.0)
MCH: 25.5 pg — ABNORMAL LOW (ref 26.0–34.0)
MCV: 79.4 fL (ref 78.0–100.0)
Monocytes Absolute: 0.6 10*3/uL (ref 0.1–1.0)
Neutro Abs: 8.5 10*3/uL — ABNORMAL HIGH (ref 1.7–7.7)
RBC: 1.65 MIL/uL — ABNORMAL LOW (ref 4.22–5.81)
RDW: 16.9 % — ABNORMAL HIGH (ref 11.5–15.5)

## 2013-05-10 LAB — COMPREHENSIVE METABOLIC PANEL
ALT: 13 U/L (ref 0–53)
Albumin: 2.1 g/dL — ABNORMAL LOW (ref 3.5–5.2)
Alkaline Phosphatase: 58 U/L (ref 39–117)
BUN: 25 mg/dL — ABNORMAL HIGH (ref 6–23)
Calcium: 8 mg/dL — ABNORMAL LOW (ref 8.4–10.5)
Chloride: 109 mEq/L (ref 96–112)
Potassium: 4 mEq/L (ref 3.5–5.1)
Sodium: 142 mEq/L (ref 135–145)
Total Bilirubin: 0.1 mg/dL — ABNORMAL LOW (ref 0.3–1.2)
Total Protein: 4.7 g/dL — ABNORMAL LOW (ref 6.0–8.3)

## 2013-05-10 LAB — BASIC METABOLIC PANEL
BUN: 26 mg/dL — ABNORMAL HIGH (ref 6–23)
CO2: 27 mEq/L (ref 19–32)
Calcium: 7.5 mg/dL — ABNORMAL LOW (ref 8.4–10.5)
Chloride: 110 mEq/L (ref 96–112)
Creatinine, Ser: 1.17 mg/dL (ref 0.50–1.35)
Glucose, Bld: 104 mg/dL — ABNORMAL HIGH (ref 70–99)
Potassium: 4 mEq/L (ref 3.5–5.1)
Sodium: 141 mEq/L (ref 135–145)

## 2013-05-10 LAB — LIPASE, BLOOD: Lipase: 34 U/L (ref 11–59)

## 2013-05-10 LAB — POCT I-STAT TROPONIN I

## 2013-05-10 MED ORDER — SODIUM CHLORIDE 0.9 % IV SOLN
80.0000 mg | Freq: Once | INTRAVENOUS | Status: AC
  Administered 2013-05-11: 80 mg via INTRAVENOUS
  Filled 2013-05-10: qty 80

## 2013-05-10 NOTE — ED Notes (Addendum)
Pt states that his fingers have been hurting since the 30th of September when he was admitted at [facility]. He states they didn't do anything about that and hes angry about it. He states that he felt dizzy as he was walking out of the hospital. Pt states that he doesn't know what he has because he has everything all the time. He says that when he walks he has to stop and catch his breath. He says the tingling in his fingers make him dizzy and make him want to collapse which is his main problem. Pt states that he also vomited blood.

## 2013-05-11 ENCOUNTER — Encounter (HOSPITAL_COMMUNITY)
Admission: EM | Disposition: A | Discharge status: Home / Self Care | Payer: Self-pay | Source: Home / Self Care | Attending: Internal Medicine

## 2013-05-11 ENCOUNTER — Encounter (HOSPITAL_COMMUNITY): Payer: Self-pay

## 2013-05-11 ENCOUNTER — Inpatient Hospital Stay (HOSPITAL_COMMUNITY): Payer: Medicaid Other

## 2013-05-11 DIAGNOSIS — D649 Anemia, unspecified: Secondary | ICD-10-CM

## 2013-05-11 DIAGNOSIS — D62 Acute posthemorrhagic anemia: Secondary | ICD-10-CM

## 2013-05-11 DIAGNOSIS — K269 Duodenal ulcer, unspecified as acute or chronic, without hemorrhage or perforation: Secondary | ICD-10-CM

## 2013-05-11 DIAGNOSIS — D509 Iron deficiency anemia, unspecified: Secondary | ICD-10-CM

## 2013-05-11 DIAGNOSIS — R55 Syncope and collapse: Secondary | ICD-10-CM

## 2013-05-11 DIAGNOSIS — K922 Gastrointestinal hemorrhage, unspecified: Secondary | ICD-10-CM

## 2013-05-11 DIAGNOSIS — D500 Iron deficiency anemia secondary to blood loss (chronic): Secondary | ICD-10-CM

## 2013-05-11 DIAGNOSIS — K260 Acute duodenal ulcer with hemorrhage: Secondary | ICD-10-CM

## 2013-05-11 HISTORY — PX: ESOPHAGOGASTRODUODENOSCOPY: SHX5428

## 2013-05-11 LAB — RAPID URINE DRUG SCREEN, HOSP PERFORMED
Cocaine: NOT DETECTED
Opiates: NOT DETECTED
Tetrahydrocannabinol: NOT DETECTED

## 2013-05-11 LAB — BASIC METABOLIC PANEL
BUN: 32 mg/dL — ABNORMAL HIGH (ref 6–23)
Chloride: 113 mEq/L — ABNORMAL HIGH (ref 96–112)
Creatinine, Ser: 1.09 mg/dL (ref 0.50–1.35)
GFR calc Af Amer: 88 mL/min — ABNORMAL LOW (ref >90)
GFR calc non Af Amer: 76 mL/min — ABNORMAL LOW (ref >90)

## 2013-05-11 LAB — HEMOGLOBIN AND HEMATOCRIT, BLOOD
HCT: 14.1 % — ABNORMAL LOW (ref 39.0–52.0)
HCT: 21.2 % — ABNORMAL LOW (ref 39.0–52.0)
Hemoglobin: 7.6 g/dL — ABNORMAL LOW (ref 13.0–17.0)

## 2013-05-11 LAB — APTT: aPTT: 28 seconds (ref 24–37)

## 2013-05-11 LAB — URINALYSIS W MICROSCOPIC + REFLEX CULTURE
Bilirubin Urine: NEGATIVE
Hgb urine dipstick: NEGATIVE
Nitrite: NEGATIVE
Specific Gravity, Urine: 1.018 (ref 1.005–1.030)
Urobilinogen, UA: 0.2 mg/dL (ref 0.0–1.0)
pH: 5 (ref 5.0–8.0)

## 2013-05-11 LAB — ETHANOL: Alcohol, Ethyl (B): <11 mg/dL (ref 0–11)

## 2013-05-11 LAB — PREPARE RBC (CROSSMATCH)

## 2013-05-11 LAB — PROTIME-INR: Prothrombin Time: 15.2 seconds (ref 11.6–15.2)

## 2013-05-11 SURGERY — EGD (ESOPHAGOGASTRODUODENOSCOPY)
Anesthesia: Moderate Sedation

## 2013-05-11 MED ORDER — THIAMINE HCL 100 MG/ML IJ SOLN
100.0000 mg | Freq: Every day | INTRAMUSCULAR | Status: DC
  Administered 2013-05-14: 100 mg via INTRAVENOUS
  Filled 2013-05-11 (×7): qty 1

## 2013-05-11 MED ORDER — DIPHENHYDRAMINE HCL 50 MG/ML IJ SOLN
INTRAMUSCULAR | Status: AC
  Filled 2013-05-11: qty 1

## 2013-05-11 MED ORDER — SODIUM CHLORIDE 0.9 % IJ SOLN
3.0000 mL | Freq: Two times a day (BID) | INTRAMUSCULAR | Status: DC
  Administered 2013-05-12 – 2013-05-15 (×2): 3 mL via INTRAVENOUS

## 2013-05-11 MED ORDER — SODIUM CHLORIDE 0.9 % IV SOLN: 80.0000 mg | Freq: Once | INTRAVENOUS | Status: DC

## 2013-05-11 MED ORDER — SODIUM CHLORIDE 0.9 % IV SOLN
1.0000 g | Freq: Once | INTRAVENOUS | Status: AC
  Administered 2013-05-11: 1 g via INTRAVENOUS
  Filled 2013-05-11: qty 10

## 2013-05-11 MED ORDER — LORAZEPAM 2 MG/ML IJ SOLN
0.0000 mg | Freq: Four times a day (QID) | INTRAMUSCULAR | Status: AC
  Administered 2013-05-11: 1 mg via INTRAVENOUS
  Administered 2013-05-12 – 2013-05-13 (×3): 2 mg via INTRAVENOUS
  Filled 2013-05-11 (×3): qty 1

## 2013-05-11 MED ORDER — ONDANSETRON HCL 4 MG PO TABS: 4.0000 mg | ORAL_TABLET | Freq: Four times a day (QID) | ORAL | Status: DC | PRN

## 2013-05-11 MED ORDER — ONDANSETRON HCL 4 MG/2ML IJ SOLN: 4.0000 mg | Freq: Four times a day (QID) | INTRAMUSCULAR | Status: DC | PRN

## 2013-05-11 MED ORDER — FUROSEMIDE 10 MG/ML IJ SOLN
20.0000 mg | Freq: Once | INTRAMUSCULAR | Status: AC
  Administered 2013-05-11: 20 mg via INTRAVENOUS

## 2013-05-11 MED ORDER — EPINEPHRINE HCL 0.1 MG/ML IJ SOSY
PREFILLED_SYRINGE | INTRAMUSCULAR | Status: AC
  Filled 2013-05-11: qty 10

## 2013-05-11 MED ORDER — ACETAMINOPHEN 325 MG PO TABS: 650.0000 mg | ORAL_TABLET | Freq: Four times a day (QID) | ORAL | Status: DC | PRN

## 2013-05-11 MED ORDER — SODIUM CHLORIDE 0.9 % IJ SOLN
PREFILLED_SYRINGE | INTRAMUSCULAR | Status: DC | PRN
  Administered 2013-05-11: 15:00:00

## 2013-05-11 MED ORDER — FUROSEMIDE 10 MG/ML IJ SOLN
INTRAMUSCULAR | Status: AC
  Filled 2013-05-11: qty 4

## 2013-05-11 MED ORDER — LORAZEPAM 2 MG/ML IJ SOLN
0.0000 mg | Freq: Two times a day (BID) | INTRAMUSCULAR | Status: AC
  Filled 2013-05-11: qty 1

## 2013-05-11 MED ORDER — VITAMIN B-1 100 MG PO TABS
100.0000 mg | ORAL_TABLET | Freq: Every day | ORAL | Status: DC
  Administered 2013-05-15 – 2013-05-17 (×3): 100 mg via ORAL
  Filled 2013-05-11 (×7): qty 1

## 2013-05-11 MED ORDER — ACETAMINOPHEN 650 MG RE SUPP: 650.0000 mg | Freq: Four times a day (QID) | RECTAL | Status: DC | PRN

## 2013-05-11 MED ORDER — MIDAZOLAM HCL 5 MG/ML IJ SOLN
INTRAMUSCULAR | Status: AC
  Filled 2013-05-11: qty 2

## 2013-05-11 MED ORDER — SODIUM CHLORIDE 0.9 % IV SOLN
INTRAVENOUS | Status: DC
  Administered 2013-05-11: 20 mL/h via INTRAVENOUS

## 2013-05-11 MED ORDER — BUTAMBEN-TETRACAINE-BENZOCAINE 2-2-14 % EX AERO
INHALATION_SPRAY | CUTANEOUS | Status: DC | PRN
  Administered 2013-05-11: 2 via TOPICAL

## 2013-05-11 MED ORDER — FOLIC ACID 1 MG PO TABS
1.0000 mg | ORAL_TABLET | Freq: Every day | ORAL | Status: DC
  Administered 2013-05-15 – 2013-05-17 (×3): 1 mg via ORAL
  Filled 2013-05-11 (×7): qty 1

## 2013-05-11 MED ORDER — SODIUM CHLORIDE 0.9 % IV SOLN
25.0000 ug/h | INTRAVENOUS | Status: DC
  Administered 2013-05-11 – 2013-05-14 (×9): 150 ug/h via INTRAVENOUS
  Administered 2013-05-14: 50 ug/h via INTRAVENOUS
  Administered 2013-05-14 (×3): 150 ug/h via INTRAVENOUS
  Administered 2013-05-15: 50 ug/h via INTRAVENOUS
  Administered 2013-05-15: 25 ug/h via INTRAVENOUS
  Filled 2013-05-11 (×44): qty 1

## 2013-05-11 MED ORDER — LORAZEPAM 1 MG PO TABS: 1.0000 mg | ORAL_TABLET | Freq: Four times a day (QID) | ORAL | Status: DC | PRN

## 2013-05-11 MED ORDER — FENTANYL CITRATE 0.05 MG/ML IJ SOLN
INTRAMUSCULAR | Status: DC | PRN
  Administered 2013-05-11 (×2): 25 ug via INTRAVENOUS

## 2013-05-11 MED ORDER — MIDAZOLAM HCL 10 MG/2ML IJ SOLN
INTRAMUSCULAR | Status: DC | PRN
  Administered 2013-05-11 (×5): 2 mg via INTRAVENOUS

## 2013-05-11 MED ORDER — FENTANYL CITRATE 0.05 MG/ML IJ SOLN
INTRAMUSCULAR | Status: AC
  Filled 2013-05-11: qty 4

## 2013-05-11 MED ORDER — PANTOPRAZOLE SODIUM 40 MG IV SOLR
40.0000 mg | Freq: Two times a day (BID) | INTRAVENOUS | Status: DC
  Administered 2013-05-14 – 2013-05-17 (×7): 40 mg via INTRAVENOUS
  Filled 2013-05-11 (×8): qty 40

## 2013-05-11 MED ORDER — ADULT MULTIVITAMIN W/MINERALS CH
1.0000 | ORAL_TABLET | Freq: Every day | ORAL | Status: DC
  Administered 2013-05-15 – 2013-05-17 (×3): 1 via ORAL
  Filled 2013-05-11 (×7): qty 1

## 2013-05-11 MED ORDER — LORAZEPAM 2 MG/ML IJ SOLN
1.0000 mg | Freq: Four times a day (QID) | INTRAMUSCULAR | Status: DC | PRN
  Administered 2013-05-13: 1 mg via INTRAVENOUS
  Filled 2013-05-11 (×2): qty 1

## 2013-05-11 MED ORDER — SODIUM CHLORIDE 0.9 % IV SOLN
8.0000 mg/h | INTRAVENOUS | Status: AC
  Administered 2013-05-11 – 2013-05-13 (×3): 8 mg/h via INTRAVENOUS
  Filled 2013-05-11 (×15): qty 80

## 2013-05-11 NOTE — Consult Note (Signed)
Subjective:   HPI  The patient is a 52 year old male who has a history of a bleeding duodenal ulcer. In January 2014 he had exploratory laparotomy with oversewing of the bleeding ulcer. This was done after endoscopy by Dr. Wendall Papa with clipping of bleeding vessel. He is an alcoholic and continues to drink. He presented to the hospital with weakness after recently being in the hospital for GI bleeding again. On May 01, 2013 the patient underwent EGD by Dr. [name]. She found a bleeding ulcer. She injected it with epinephrine.he was then seen by surgery. He ended up having interventional radiology do arterial embolization. He was discharged from the hospital a few days ago but returns. He states he has been continuing to have dark black stools. His hemoglobin and hematocrit have dropped.  Review of Systems Not complaining of chest pain or shortness of breath. He complains of tingling in his fingers  Past Medical History  Diagnosis Date  . Abdominal  pain, other specified site   . GERD (gastroesophageal reflux disease)   . Abnormal CT scan, esophagus 03/2012  . Substance abuse 03/2012    tox screen positive for cocaine.    Past Surgical History  Procedure Laterality Date  . Undescended testicle      on right. noted on 03/2012 CT scan.  no surgery referral.   . Esophagogastroduodenoscopy  08/10/2012    Procedure: ESOPHAGOGASTRODUODENOSCOPY (EGD);  Surgeon: [name], MD;  Location: [facility] ENDOSCOPY;  Service: Endoscopy;  Laterality: N/A;  will be done in room 1   . Laparotomy  08/10/2012    Procedure: EXPLORATORY LAPAROTOMY;  Surgeon: [name], MD;  Location: [facility] OR;  Service: General;  Laterality: N/A;  . Esophagogastroduodenoscopy Left 05/01/2013    Procedure: ESOPHAGOGASTRODUODENOSCOPY (EGD);  Surgeon: [name], MD;  Location: [facility] ENDOSCOPY;  Service: Endoscopy;  Laterality: Left;   History   Social History  . Marital Status: Single    Spouse Name: N/A    Number of Children: N/A   . Years of Education: N/A   Occupational History  . Not on file.   Social History Main Topics  . Smoking status: Current Every Day Smoker -- 1.00 packs/day for 34 years  . Smokeless tobacco: Never Used  . Alcohol Use: Yes     Comment: 40oz beer/day  . Drug Use: Yes    Special: Marijuana     Comment: occ - "$5 bag" "only when I got money"  . Sexual Activity: Not on file   Other Topics Concern  . Not on file   Social History Narrative  . No narrative on file   family history is not on file. Current facility-administered medications:acetaminophen (TYLENOL) suppository 650 mg, 650 mg, Rectal, Q6H PRN, Catarina Hartshorn, MD;  acetaminophen (TYLENOL) tablet 650 mg, 650 mg, Oral, Q6H PRN, Catarina Hartshorn, MD;  ondansetron (ZOFRAN) injection 4 mg, 4 mg, Intravenous, Q6H PRN, Catarina Hartshorn, MD;  ondansetron (ZOFRAN) tablet 4 mg, 4 mg, Oral, Q6H PRN, Catarina Hartshorn, MD pantoprazole (PROTONIX) 80 mg in sodium chloride 0.9 % 250 mL infusion, 8 mg/hr, Intravenous, Continuous, Catarina Hartshorn, MD, Last Rate: 25 mL/hr at 05/11/13 0600, 8 mg/hr at 05/11/13 0600;  [START ON 05/14/2013] pantoprazole (PROTONIX) injection 40 mg, 40 mg, Intravenous, Q12H, David Tat, MD;  sodium chloride 0.9 % injection 3 mL, 3 mL, Intravenous, Q12H, Catarina Hartshorn, MD Allergies  Allergen Reactions  . Chocolate Nausea And Vomiting  . Peanut-Containing Drug Products Nausea And Vomiting  . Spinach Nausea And Vomiting  Objective:     BP 123/44  Pulse 107  Temp(Src) 98.5 F (36.9 C) (Oral)  Resp 20  Ht 5\' 9"  (1.753 m)  Wt 78.9 kg (173 lb 15.1 oz)  BMI 25.68 kg/m2  SpO2 100%  He is in no distress  Heart regular rhythm no murmurs  Lungs clear  Abdomen: Bowel sounds normal, soft, nontender  Laboratory No components found with this basename: d1      Assessment:     GI bleed, most likely recurrent bleeding from duodenal ulcer.  Anemia secondary to blood loss      Plan:     Transfuse blood. PPI therapy. We will plan EGD today.  I will notify surgery that he is here to be on board in case surgical intervention is required.  Lab Results  Component Value Date   HGB 4.6* 05/11/2013   HGB 4.2* 05/10/2013   HGB 8.4* 05/05/2013   HCT 14.1* 05/11/2013   HCT 13.1* 05/10/2013   HCT 25.2* 05/05/2013   ALKPHOS 58 05/10/2013   ALKPHOS 37* 05/01/2013   ALKPHOS 31* 04/26/2013   AST 17 05/10/2013   AST 17 05/01/2013   AST 27 04/26/2013   ALT 13 05/10/2013   ALT 14 05/01/2013   ALT 11 04/26/2013

## 2013-05-11 NOTE — Consult Note (Signed)
PULMONARY  / CRITICAL CARE MEDICINE  Name: [name] MRN: [id number] DOB: Sep 30, 1960    ADMISSION DATE:  05/10/2013 CONSULTATION DATE:  05/11/13  REFERRING MD :  Dr Wandalee Ferdinand GI PRIMARY SERVICE: [facility]  CHIEF COMPLAINT:  Acute GI bleed.   HPI: 52 year old [place] American male with a history of cocaine abuse and tobacco abuse. He is an alcoholic with continued alcohol consumption. Presented in 04/20/2013 with a hemoglobin of 2.7 g percent and acute encephalopathy but left AGAINST MEDICAL ADVICE 04/29/2013 after receiving 6 units of packed red blood cell.. Readmitted 04/30/2013 with a hemoglobin of 5.7 g percent and dizziness. September early October 2014 with acute GI bleed. Endoscopy 05/01/2013 showed a bleeding duodenal ulcer but no esophageal varices. Failed epinephrine local injection. Subsequently bleeding controlled by arterial embolization by interventional radiology. He was then discharged 05/05/13  . However, returns 05/10/2013  with ongoing melena hemoglobin of 4 g percent but was hemodynamically stable except for tachycardia This hemoglobin did not improve despite 2 units of packed red blood cells. On the morning of 05/11/2013 blood pressure okay but tachycardic at 110 per minute. GI service is planning to do emergency endoscopy with backup plan for surgical consultation. Critical care medicine has been consulted for hemodynamic support  Noted 05/01/13  pscych consult - patient did not have capcacitya t that time    SIGNIFICANT EVENTS / STUDIES:  Admitted 05/10/2013  LINES / TUBES: PIV  CULTURES: Results for orders placed during the hospital encounter of 04/30/13  MRSA PCR SCREENING     Status: None   Collection Time    04/30/13  6:42 PM      Result Value Range Status   MRSA by PCR NEGATIVE  NEGATIVE Final   Comment:            The GeneXpert MRSA Assay (FDA     approved for NASAL specimens     only), is one component of a     comprehensive MRSA colonization      surveillance program. It is not     intended to diagnose MRSA     infection nor to guide or     monitor treatment for     MRSA infections.     Performed at [facility]  URINE CULTURE     Status: None   Collection Time    04/30/13  9:02 PM      Result Value Range Status   Specimen Description URINE, CLEAN CATCH   Final   Special Requests NONE   Final   Culture  Setup Time     Final   Value: 05/01/2013 03:01     Performed at [facility] Count     Final   Value: 15,000 COLONIES/ML     Performed at [facility]   Culture     Final   Value: Multiple bacterial morphotypes present, none predominant. Suggest appropriate recollection if clinically indicated.     Performed at [facility]   Report Status 05/02/2013 FINAL   Final     ANTIBIOTICS: Anti-infectives   None         PAST MEDICAL HISTORY :  Past Medical History  Diagnosis Date  . Abdominal  pain, other specified site   . GERD (gastroesophageal reflux disease)   . Abnormal CT scan, esophagus 03/2012  . Substance abuse 03/2012    tox screen positive for cocaine.    Past Surgical History  Procedure Laterality Date  .  Undescended testicle      on right. noted on 03/2012 CT scan.  no surgery referral.   . Esophagogastroduodenoscopy  08/10/2012    Procedure: ESOPHAGOGASTRODUODENOSCOPY (EGD);  Surgeon: [name], MD;  Location: [facility] ENDOSCOPY;  Service: Endoscopy;  Laterality: N/A;  will be done in room 1   . Laparotomy  08/10/2012    Procedure: EXPLORATORY LAPAROTOMY;  Surgeon: [name], MD;  Location: [facility] OR;  Service: General;  Laterality: N/A;  . Esophagogastroduodenoscopy Left 05/01/2013    Procedure: ESOPHAGOGASTRODUODENOSCOPY (EGD);  Surgeon: [name], MD;  Location: [facility] ENDOSCOPY;  Service: Endoscopy;  Laterality: Left;   Prior to Admission medications   Medication Sig Start Date End Date Taking? Authorizing Provider  pantoprazole (PROTONIX) 40 MG tablet Take 1  tablet (40 mg total) by mouth 2 (two) times daily. 05/05/13  Yes Rhetta Mura, MD  sucralfate (CARAFATE) 1 GM/10ML suspension Take 10 mLs (1 g total) by mouth 4 (four) times daily -  with meals and at bedtime. 05/05/13  Yes Rhetta Mura, MD   Allergies  Allergen Reactions  . Chocolate Nausea And Vomiting  . Peanut-Containing Drug Products Nausea And Vomiting  . Spinach Nausea And Vomiting    FAMILY HISTORY:  History reviewed. No pertinent family history. SOCIAL HISTORY:  reports that he has been smoking.  He has never used smokeless tobacco. He reports that he drinks alcohol. He reports that he uses illicit drugs (Marijuana).  REVIEW OF SYSTEMS:  Per HPI. Rest negative   VITAL SIGNS: Temp:  [98 F (36.7 C)-98.9 F (37.2 C)] 98.5 F (36.9 C) (10/11 0800) Pulse Rate:  [97-115] 107 (10/11 0645) Resp:  [16-24] 20 (10/11 0645) BP: (75-123)/(28-62) 101/39 mmHg (10/11 0800) SpO2:  [98 %-100 %] 100 % (10/11 0645) Weight:  [72.576 kg (160 lb)-78.9 kg (173 lb 15.1 oz)] 78.9 kg (173 lb 15.1 oz) (10/11 0214) HEMODYNAMICS:   VENTILATOR SETTINGS:   INTAKE / OUTPUT: Intake/Output     10/10 0701 - 10/11 0700 10/11 0701 - 10/12 0700   Blood 655    Total Intake(mL/kg) 655 (8.3)    Urine (mL/kg/hr) 500 850 (2.5)   Stool  2 (0)   Total Output 500 852   Net +155 -852        Stool Occurrence  1 x     PHYSICAL EXAMINATION: General:  Well built [place] male. Lying in bed. No distress. Neuro:  Appears alert and oriented x3. Speech is normal. Moves all fours. Calm Psychiatry: Did not do a detailed evaluation of capacity but he seems to have some insight as to what is going on HEENT:  Neck is supple. Mucosa is pale teeth are in poor repair Cardiovascular:  Heart rate her 100 per minute. Regular rate and rhythm. Sinus tachycardia. Lungs:  No respiratory distress. Normal work of breathing. Left basal crackles present. Abdomen:  Soft, nontender. Has a scar of previous  laparotomy Musculoskeletal:  No cyanosis no clubbing no pedal edema Skin:  Skin appears intact  LABS: PULMONARY No results found for this basename: PHART, PCO2, PCO2ART, PO2, PO2ART, HCO3, TCO2, O2SAT,  in the last 168 hours  CBC  Recent Labs Lab 05/05/13 0350 05/10/13 2121 05/11/13 0900  HGB 8.4* 4.2* 4.6*  HCT 25.2* 13.1* 14.1*  WBC 9.0 10.7*  --   PLT 143* 174  --     COAGULATION  Recent Labs Lab 05/11/13 0900  INR 1.23    CARDIAC  No results found for this basename: TROPONINI,  in the last 168 hours No results found for this basename: PROBNP,  in the last 168 hours   CHEMISTRY  Recent Labs Lab 05/10/13 2020 05/10/13 2121 05/11/13 0900  NA 142 141 143  K 4.0 4.0 4.0  CL 109 110 113*  CO2 26 27 25   GLUCOSE 93 104* 109*  BUN 25* 26* 32*  CREATININE 1.16 1.17 1.09  CALCIUM 8.0* 7.5* 7.2*   Estimated Creatinine Clearance: 79.3 ml/min (by C-G formula based on Cr of 1.09).   LIVER  Recent Labs Lab 05/10/13 2020 05/11/13 0900  AST 17  --   ALT 13  --   ALKPHOS 58  --   BILITOT 0.1*  --   PROT 4.7*  --   ALBUMIN 2.1*  --   INR  --  1.23     INFECTIOUS No results found for this basename: LATICACIDVEN, PROCALCITON,  in the last 168 hours   ENDOCRINE CBG (last 3)  No results found for this basename: GLUCAP,  in the last 72 hours       IMAGING x48h  No results found.    ASSESSMENT / PLAN:  PULMONARY A: Some basal crackle L > R P:   CXR PORt  Monitor resp status  CARDIOVASCULAR A:  Maintaing BP but HR 100 wit GI Bleed P:  Aggressive resus; give 3 unit PRBC Place 3rd PIOV Depending on course, CVL  RENAL A:  At risk for hypocalcemia and ATN due to tx and volume loss P:   Monitor Empiric calcium gluconate  GASTROINTESTINAL A:  REccurent GI bleed. Failed  IR embolization and endo epi injection. Poor compliance patient P:   ? Go straight to surgery; will aks GI   HEMATOLOGIC A:  Acute blood loss anemia P:  PRBC for  hgb < 7gm% based on NEJM 2013 literatures  INFECTIOUS A:  No evidence of infection P:   monitor  ENDOCRINE A:  Nil acute   P:   monitor  NEUROLOGIC A:  Susbstance abuse. Had DT edn sept 2014 while in hospital. Had poor capacity end sept 2014.  P:   CIWA protocol ICU if worse Can consider precedex  TODAY'S SUMMARY:   05/11/13: Will place CVL dpending on BP response to prbc and timing of endoscioy. Suspect will benefit direct from surgery due to his multiple issues. D/w DR Evette Cristal. Will call [facility]   The patient is critically ill with multiple organ systems failure and requires high complexity decision making for assessment and support, frequent evaluation and titration of therapies, application of advanced monitoring technologies and extensive interpretation of multiple databases.   Critical Care Time devoted to patient care services described in this note is  45 Minutes.  Dr. Kalman Shan, M.D., [facility].C.P Pulmonary and Critical Care Medicine Staff Physician [facility] [place] [facility] Pager: [phone], If no answer or between  15:00h - 7:00h: call [phone]  05/11/2013 11:42 AM

## 2013-05-11 NOTE — Progress Notes (Signed)
I have seen and examined the pt and agree with PA-Osborne's progress note. Pt currently undergoing ERCP to eval bleed and localize.   Pt verbalized no surgery wanted. Would attempt IR study adn embolization if needed.

## 2013-05-11 NOTE — ED Notes (Signed)
All of pts belongings sent with pt. 

## 2013-05-11 NOTE — Progress Notes (Signed)
[facility] PROGRESS NOTE  [name] ZOX:[id number] DOB: 11-15-1960 DOA: 05/10/2013 PCP: [name], MD  Assessment/Plan: 1. *Anemia- secondary to acute GI bleed Has got 2 units PRBC, will get two more units. Initially came with Hb of 4.2, repeat Hb after the blood transfusion is 4.6, will give two more units of PRBC. We'll continue to monitor the H&H every 6 hours.  2. UGI Bleed- Patient has bleeding duodenal ulcer, status post EGD with epinephrine injection and later arterial embolization by interventional radiology, patient again presented with anemia and bloody stools. He has been started on Protonix infusion and GI has been consulted. Plan is for endoscopy today   3. Possible pulmonary edema- he has bilateral crackles on auscultation, will give him one dose of lasix 20 mg x 1 before third unit PRBC.  4. Peripheral neuropathy- ? alcohol induced vs anemia. Will reevaluate after blood transfusion.  5. Syncope-patient had a fall due to lower extremity weakness, could have been syncope or weakness due to severe anemia. Patient will monitored on telemetry, EKG showed sinus tachycardia.  6. Protein calorie malnutrition- patient's albumin is 2.1, he does have lower extremity edema which could be due to hypoalbuminemia. 2-D       Echocardiogram has been ordered to evaluate patient's ejection fraction.  7. DVT prophylaxis- SCDs  Code Status: *Full code Family Communication: *Discussed with patient in detail Disposition Plan: *TBD   Consultants:  *GI  Procedures:  None  Antibiotics:  *  HPI/Subjective: *Patient seen and examined, admitted with severe anemia, secondary to GI bleed. Complains of numbness and tingling of both the hands going on for a week.  Objective: Filed Vitals:   05/11/13 0645  BP: 123/44  Pulse: 107  Temp:   Resp: 20    Intake/Output Summary (Last 24 hours) at 05/11/13 0821 Last data filed at 05/11/13 0640  Gross per 24 hour  Intake     655 ml  Output    500 ml  Net    155 ml   Filed Weights   05/10/13 2010 05/11/13 0214  Weight: 72.576 kg (160 lb) 78.9 kg (173 lb 15.1 oz)    Exam:  Physical Exam: Head: Normocephalic, atraumatic.  Eyes: No signs of jaundice, EOMI Nose: Mucous membranes dry.  Throat: Oropharynx nonerythematous, no exudate appreciated.  Neck: supple,No deformities, masses, or tenderness noted. Lungs: Normal respiratory effort. B/L  crackles on auscultation. Heart: Regular RR. S1 and S2 normal  Abdomen: BS normoactive. Soft, Nondistended, non-tender.  Extremities: No pretibial edema, no erythema Neurological- sensations on both the hands are intact, no limitation of movement of the upper extremities. Strength 5 of 5 both upper extremities.  Data Reviewed: Basic Metabolic Panel:  Recent Labs Lab 05/10/13 2020 05/10/13 2121  NA 142 141  K 4.0 4.0  CL 109 110  CO2 26 27  GLUCOSE 93 104*  BUN 25* 26*  CREATININE 1.16 1.17  CALCIUM 8.0* 7.5*   Liver Function Tests:  Recent Labs Lab 05/10/13 2020  AST 17  ALT 13  ALKPHOS 58  BILITOT 0.1*  PROT 4.7*  ALBUMIN 2.1*    Recent Labs Lab 05/10/13 2020  LIPASE 34   No results found for this basename: AMMONIA,  in the last 168 hours CBC:  Recent Labs Lab 05/04/13 0830 05/04/13 1108 05/04/13 2305 05/05/13 0350 05/10/13 2121  WBC 10.4  --   --  9.0 10.7*  NEUTROABS  --   --   --   --  8.5*  HGB 9.2*  8.6* 8.8* 8.4* 4.2*  HCT 27.0* 25.2* 26.1* 25.2* 13.1*  MCV 79.4  --   --  79.2 79.4  PLT 152  --   --  143* 174   Cardiac Enzymes: No results found for this basename: CKTOTAL, CKMB, CKMBINDEX, TROPONINI,  in the last 168 hours BNP (last 3 results) No results found for this basename: PROBNP,  in the last 8760 hours CBG: No results found for this basename: GLUCAP,  in the last 168 hours  No results found for this or any previous visit (from the past 240 hour(s)).   Studies: No results found.  Scheduled Meds: . furosemide   20 mg Intravenous Once  . [START ON 05/14/2013] pantoprazole (PROTONIX) IV  40 mg Intravenous Q12H  . sodium chloride  3 mL Intravenous Q12H   Continuous Infusions: . pantoprozole (PROTONIX) infusion 8 mg/hr (05/11/13 0600)    Active Problems:   Alcohol abuse   GIB (gastrointestinal bleeding)   Syncope   Acute blood loss anemia    Time spent: *25 min    [facility]  [facility] Pager [phone]*. If 7PM-7AM, please contact night-coverage at [email or web address], password [facility] 05/11/2013, 8:21 AM  LOS: 1 day

## 2013-05-11 NOTE — ED Provider Notes (Signed)
CSN: [id number]     Arrival date & time 05/10/13  1952 History   First MD Initiated Contact with Patient 05/10/13 2115     Chief Complaint  Patient presents with  . Numbness  . Chest Pain  . Leg Swelling   (Consider location/radiation/quality/duration/timing/severity/associated sxs/prior Treatment) HPI Comments: 52 y/o male with history of recent hospitalization for GI bleed s/p embolization of GDA, EtOH abuse presenting with generalized fatigue. Reports 2 weeks of dizziness, substernal chest pain, and SOB with minimal exertion. Also notes recurrence of maroon colored stools and nausea. Denies emesis, fever, cough. Reports daily EtOH use when he has money.   The history is provided by the patient. No language interpreter was used.    Past Medical History  Diagnosis Date  . Abdominal  pain, other specified site   . GERD (gastroesophageal reflux disease)   . Abnormal CT scan, esophagus 03/2012  . Substance abuse 03/2012    tox screen positive for cocaine.    Past Surgical History  Procedure Laterality Date  . Undescended testicle      on right. noted on 03/2012 CT scan.  no surgery referral.   . Esophagogastroduodenoscopy  08/10/2012    Procedure: ESOPHAGOGASTRODUODENOSCOPY (EGD);  Surgeon: [name], MD;  Location: [facility] ENDOSCOPY;  Service: Endoscopy;  Laterality: N/A;  will be done in room 1   . Laparotomy  08/10/2012    Procedure: EXPLORATORY LAPAROTOMY;  Surgeon: [name], MD;  Location: [facility] OR;  Service: General;  Laterality: N/A;  . Esophagogastroduodenoscopy Left 05/01/2013    Procedure: ESOPHAGOGASTRODUODENOSCOPY (EGD);  Surgeon: [name], MD;  Location: [facility] ENDOSCOPY;  Service: Endoscopy;  Laterality: Left;   History reviewed. No pertinent family history. History  Substance Use Topics  . Smoking status: Current Every Day Smoker -- 1.00 packs/day for 34 years  . Smokeless tobacco: Never Used  . Alcohol Use: Yes     Comment: 40oz beer/day    Review of Systems   Constitutional: Positive for activity change, appetite change and fatigue. Negative for fever.  Respiratory: Positive for chest tightness and shortness of breath. Negative for cough.   Cardiovascular: Positive for chest pain. Negative for palpitations and leg swelling.  Gastrointestinal: Positive for nausea and blood in stool. Negative for vomiting, abdominal pain, diarrhea and abdominal distention.  Genitourinary: Negative for dysuria and hematuria.  Neurological: Positive for dizziness and weakness. Negative for seizures, syncope, facial asymmetry and numbness.  All other systems reviewed and are negative.    Allergies  Chocolate; Peanut-containing drug products; and Spinach  Home Medications  No current outpatient prescriptions on file. BP 102/52  Pulse 92  Temp(Src) 98.6 F (37 C) (Oral)  Resp 22  Ht 5\' 9"  (1.753 m)  Wt 173 lb 15.1 oz (78.9 kg)  BMI 25.68 kg/m2  SpO2 98% Physical Exam  Vitals reviewed. Constitutional: He is oriented to person, place, and time. No distress.  HENT:  Head: Normocephalic.  Mouth/Throat: Oropharynx is clear and moist.  Eyes: Conjunctivae are normal.  Cardiovascular: Regular rhythm, normal heart sounds and intact distal pulses.  Tachycardia present.   Pulmonary/Chest: Effort normal and breath sounds normal.  Abdominal: Soft. Bowel sounds are normal. He exhibits no distension. There is no tenderness. There is no rebound.  Genitourinary: Rectal exam shows no external hemorrhoid, no fissure, no mass and no tenderness. Guaiac positive stool.  Neurological: He is alert and oriented to person, place, and time.  Skin: Skin is warm and dry.    ED Course  Procedures (including critical care time) Labs Review Labs Reviewed  COMPREHENSIVE METABOLIC PANEL - Abnormal; Notable for the following:    BUN 25 (*)    Calcium 8.0 (*)    Total Protein 4.7 (*)    Albumin 2.1 (*)    Total Bilirubin 0.1 (*)    GFR calc non Af Amer 71 (*)    GFR calc Af Amer  82 (*)    All other components within normal limits  CBC WITH DIFFERENTIAL - Abnormal; Notable for the following:    WBC 10.7 (*)    RBC 1.65 (*)    Hemoglobin 4.2 (*)    HCT 13.1 (*)    MCH 25.5 (*)    RDW 16.9 (*)    Neutrophils Relative % 79 (*)    Neutro Abs 8.5 (*)    All other components within normal limits  BASIC METABOLIC PANEL - Abnormal; Notable for the following:    Glucose, Bld 104 (*)    BUN 26 (*)    Calcium 7.5 (*)    GFR calc non Af Amer 70 (*)    GFR calc Af Amer 81 (*)    All other components within normal limits  URINALYSIS W MICROSCOPIC + REFLEX CULTURE - Abnormal; Notable for the following:    Leukocytes, UA SMALL (*)    All other components within normal limits  BASIC METABOLIC PANEL - Abnormal; Notable for the following:    Chloride 113 (*)    Glucose, Bld 109 (*)    BUN 32 (*)    Calcium 7.2 (*)    GFR calc non Af Amer 76 (*)    GFR calc Af Amer 88 (*)    All other components within normal limits  HEMOGLOBIN AND HEMATOCRIT, BLOOD - Abnormal; Notable for the following:    Hemoglobin 4.6 (*)    HCT 14.1 (*)    All other components within normal limits  OCCULT BLOOD, POC DEVICE - Abnormal; Notable for the following:    Fecal Occult Bld POSITIVE (*)    All other components within normal limits  LIPASE, BLOOD  URINE RAPID DRUG SCREEN (HOSP PERFORMED)  APTT  ETHANOL  PROTIME-INR  CBC WITH DIFFERENTIAL  HEMOGLOBIN AND HEMATOCRIT, BLOOD  HEMOGLOBIN AND HEMATOCRIT, BLOOD  URINE RAPID DRUG SCREEN (HOSP PERFORMED)  HEMOGLOBIN AND HEMATOCRIT, BLOOD  POCT I-STAT TROPONIN I  PREPARE RBC (CROSSMATCH)  TYPE AND SCREEN  PREPARE RBC (CROSSMATCH)  PREPARE RBC (CROSSMATCH)   Imaging Review Dg Chest Port 1 View  05/11/2013   CLINICAL DATA:  Crackles on examination.  EXAM: PORTABLE CHEST - 1 VIEW  COMPARISON:  CHEST x-ray 04/30/2013.  FINDINGS: Lung volumes are low. No consolidative airspace disease. No pleural effusions. Linear opacity in left base, most  likely to represent subsegmental atelectasis. There is a small amount of interstitial prominence throughout the medial aspects of the lung apices bilaterally, particularly on the left, similar to prior studies, favored to represent chronic post infectious or inflammatory scarring. No evidence of pulmonary edema. Heart size is normal. Upper mediastinal contours are within normal limits.  IMPRESSION: 1. Similar appearance of the chest, demonstrating no radiographic evidence of acute cardiopulmonary disease, as discussed above.   Electronically Signed   By: Trudie Reed M.D.   On: 05/11/2013 12:36    EKG Interpretation     Ventricular Rate:    PR Interval:    QRS Duration:   QT Interval:    QTC Calculation:   R Axis:  Text Interpretation:              Date: 05/11/2013  Rate: 107  Rhythm: sinus tachycardia  QRS Axis: normal  Intervals: normal  ST/T Wave abnormalities: normal  Conduction Disutrbances:none  Narrative Interpretation: sinus tachycardia, no ischemic changes   Old EKG Reviewed: unchanged   MDM   1. Acute blood loss anemia   2. GI bleed   3. Alcohol abuse   4. Microcytic anemia   5. Syncope   6. Anemia   7. Anemia due to GI blood loss   8. Duodenal ulcer   9. GIB (gastrointestinal bleeding)     52 y/o male with EtOH abuse and prior GI bleed presenting with melena and symptomatic anemia. Admitted 10/3 with embolization of GDA. Tachycardic on arrival, BP soft, other vitals normal. Abdomen soft, no peritonitis. Doubt perforated ulcer. Lungs clear. Stool heme +, grossly negative. BP improved with IVF. Patient then had large maroon bowel movement. Hb 4.2. Type and crossed 2 units of blood. Protonix IV given. GI consulted and will scope in am. Admitted to Hospitalist in stable condition.   Labs and imaging reviewed in my medical decision making if ordered. Patient discussed with my attending, Dr. Bluford Kaufmann, MD 05/11/13 1539

## 2013-05-11 NOTE — Op Note (Signed)
[facility] [address] [place], [address]   ENDOSCOPY PROCEDURE REPORT  PATIENT: Ryan, Frazier  MR#: [id number] BIRTHDATE: Sep 07, 1960 , 52  yrs. old GENDER: Male ENDOSCOPIST: Wandalee Ferdinand, MD REFERRED BY: PROCEDURE DATE:  05/11/2013 PROCEDURE:   EGD with epinephrine injection ASA CLASS: 3 INDICATIONS: upper GI bleed in a patient with a known duodenal ulcer, previously scoped about a week ago and had interventional radiology to embolize the gastroduodenal artery due to continued bleeding MEDICATIONS: fentanyl 50 mcg IV, Versed 10 mg IV TOPICAL ANESTHETIC: Cetacaine spray the son that for one of the clots  DESCRIPTION OF PROCEDURE:   After the risks benefits and alternatives of the procedure were thoroughly explained, informed consent was obtained.  The Pentax Gastroscope [id number]  endoscope was introduced through the mouth and advanced to the second portion of the duodenum      , limited by Without limitations.   The instrument was slowly withdrawn as the mucosa was fully examined.      FINDINGS:  Esophagus: Normal  Stomach: Normal  Duodenum: Upon entering the duodenum I was able to see several areas of sutures. The patient had had a prior duodenal ulcer oversew in January of this year. In another area in the duodenal bulb however there was a ulcer which was approximately 1 cm in size with a visible vessel. It appeared to be oozing from the base of the vessel. I injected this area with 3 cc of 1-10,000 epinephrine with good blanching around the area. I then attempted to place an Endo Clip however with this manipulation the vessel started to bleed. The Endo Clip did not take. I attempted a couple other Endo Clip placements but this ulcer base was hard and the endoclips would not compress the mucosa and a would just spring off. I repeated injections of a couple more cc of epinephrine at the end of the procedure there was no further active  bleeding.  COMPLICATIONS:none  ENDOSCOPIC IMPRESSION:see above   RECOMMENDATIONS:case was discussed with Dr. Derrell Lolling from surgery, he saw the patient prior to the endoscopy and the patient refused surgery. I discussed the case with Dr.Henn from interventional radiology who came up and saw the patient before the endoscopy and discussed possible repeat angiography with embolization, however in talking with Dr. Lowella Dandy the gastroduodenal artery has already been embolized based on last week's procedure and he is not confident that he could stop the bleeding. After the procedure I called and spoke to the patient's brother [name] and informed him of the patient refusing surgery. He said he was going to come up and see the patient and try to convince him that it surgery is indeed necessary to go ahead with that.   REPEAT EXAM:   _______________________________ [name], MD 05/11/2013 3:09 PM       PATIENT NAME:  Ryan, Frazier MR#: [id number]

## 2013-05-11 NOTE — Progress Notes (Signed)
Patient having a EGD at bedside, MD's at bedside x3, endo staff and RN x2 also at bedside. Patient given 3 additional units of blood running at 999 per MD(CCM), bolus 1,000 cc at 999 x 2 per MD(CCM). Patient medicated by endo RN, vitals 104/45, HR 98, on 2 L sats 94%, temp 98.3. Pt tolerated procedure well, had dark black/bloody stool during case. MD spoke with patients brother over phone. Will continue to monitor patient.

## 2013-05-11 NOTE — Progress Notes (Signed)
Patient ID: [name], male   DOB: [birth year], 52 y.o.   MRN: [id number] Patient is known to the IR service.  History of upper GI bleeding and recently had coil embolization of the GDA on 05/03/13.  Dr. Evette Cristal informed me that the patient was bleeding again and wanted me to speak to patient about embolization if endoscopic treatment was unsuccessful.  I discussed the angiography and possible embolization with the patient prior to the endoscopy procedure.  Unfortunately, I am not sure if IR has much more to offer this patient.  Recent IR procedure was unable to localize a bleeding vessel and the GDA was empirically embolized.  I think that a repeat angiogram will be low yield unless the patient is actively bleeding.  According to Dr. Evette Cristal, the patient is not actively bleeding but high risk for recurrence.   IR is available if patient bleeds again and patient does not want to consider a surgical option.

## 2013-05-11 NOTE — ED Provider Notes (Addendum)
I have supervised the resident on the management of this patient and agree with the note above. I personally interviewed and examined the patient and my addendum is below.   [name] is a 52 y.o. male hx of GI bleed here with numbness and melena. Frequent admissions for anemia requiring transfusions. He signed out AMA last visit prior to endoscopy. Occ positive here. Pale appearing. Hg 4. Started on protonix. Blood transfusion ordered. GI consulted. Admitted to medicine.   I have personally reviewed the EKG and agreed with the resident's interpretation    CRITICAL CARE Performed by: Silverio Lay, DAVID   Total critical care time: 30 min   Critical care time was exclusive of separately billable procedures and treating other patients.  Critical care was necessary to treat or prevent imminent or life-threatening deterioration.  Critical care was time spent personally by me on the following activities: development of treatment plan with patient and/or surrogate as well as nursing, discussions with consultants, evaluation of patient's response to treatment, examination of patient, obtaining history from patient or surrogate, ordering and performing treatments and interventions, ordering and review of laboratory studies, ordering and review of radiographic studies, pulse oximetry and re-evaluation of patient's condition.    Richardean Canal, MD 05/11/13 2004  Richardean Canal, MD 05/22/13 [phone]

## 2013-05-11 NOTE — ED Notes (Signed)
Walked into room after pt hit his call bell to say that he fell. Nurse, charge nurse and tech in room. Pt laying on floor with his head resting on his arm. Pt holding call bell in his hand. Iv still in place and connected to IVF. Pt refused to get up or assist in getting up. Resident DR in room at 2235. Pt assisted back to bed. Fall huddle completed.

## 2013-05-11 NOTE — H&P (Signed)
[facility] History and Physical  [name] UJW:[id number] DOB: [birth year] DOA: 05/10/2013   PCP: [name], MD   Chief Complaint: Dizziness, lower extremity weakness  HPI:  52 year old male with a history of duodenal ulcer, upper GI bleed, alcohol dependence presents with 4-5 day history of lower extremity weakness, dizziness, and a fall due to LE weakness. The patient has also been complaining of melanotic stools since discharge from the hospital on 05/05/2013. The patient had a maroon stool while in the emergency department this evening. 05/01/2013, the patient underwent EGD performed by Dr. [name] whom injected a visible vessel with 6cc epinephrine.  On 05/03/2013, the patient underwent embolization of the gastroduodenal artery. The patient also had an admission on 04/25/2013 during which he received 6 units of PRBCs, but left AGAINST MEDICAL ADVICE on 04/29/2013. He was discharged on 05/05/2013 with instructions to continue Carafate and Protonix. Unfortunately, the patient did not take his medications. Patient also has a history of a oversewn duodenal ulcer performed on January 2014. He denies any NSAID use. He denies any alcohol or illegal drug use since his last discharge. He currently denies any chest discomfort, shortness of breath, nausea, vomiting, abdominal pain. He denies any hematuria or dysuria. He has been complaining of increasing dyspnea on exertion since discharge from the hospital.  In emergency department, the patient was found to have hemoglobin of 4.2. He initially presented with a blood pressure of 75/41. It improved to 113/48 during my examination. Assessment/Plan: Upper GI bleed/acute blood loss anemia -GI was consulted from the ED--they plan for repeat endoscopy in am -keep pt npo -Transfuse 4 units PRBCs -Protonic drip -check coags -serial hgb/hct every 6 hrs Syncope -Secondary to acute blood loss anemia -Place patient on telemetry -Previous CT brain  on 04/25/2013 and 04/30/2013 negative -EKG negative for ST-T wave changes Lower extremity edema -Partly due to hypoalbuminemia -Albumin 1.8 -echo as pt also has dyspnea on exertion although this is also due to anemia Polysubstance abuse -Includes tobacco, alcohol, previous cocaine -Urine drug screening -Alcohol level         Past Medical History  Diagnosis Date  . Abdominal  pain, other specified site   . GERD (gastroesophageal reflux disease)   . Abnormal CT scan, esophagus 03/2012  . Substance abuse 03/2012    tox screen positive for cocaine.    Past Surgical History  Procedure Laterality Date  . Undescended testicle      on right. noted on 03/2012 CT scan.  no surgery referral.   . Esophagogastroduodenoscopy  08/10/2012    Procedure: ESOPHAGOGASTRODUODENOSCOPY (EGD);  Surgeon: [name], MD;  Location: [facility] ENDOSCOPY;  Service: Endoscopy;  Laterality: N/A;  will be done in room 1   . Laparotomy  08/10/2012    Procedure: EXPLORATORY LAPAROTOMY;  Surgeon: [name], MD;  Location: [facility] OR;  Service: General;  Laterality: N/A;  . Esophagogastroduodenoscopy Left 05/01/2013    Procedure: ESOPHAGOGASTRODUODENOSCOPY (EGD);  Surgeon: [name], MD;  Location: [facility] ENDOSCOPY;  Service: Endoscopy;  Laterality: Left;   Social History:  reports that he has been smoking.  He has never used smokeless tobacco. He reports that he drinks alcohol. He reports that he uses illicit drugs (Marijuana).   History reviewed. No pertinent family history.   Allergies  Allergen Reactions  . Chocolate Nausea And Vomiting  . Peanut-Containing Drug Products Nausea And Vomiting  . Spinach Nausea And Vomiting      Prior to Admission medications   Medication Sig Start Date End  Date Taking? Authorizing Provider  pantoprazole (PROTONIX) 40 MG tablet Take 1 tablet (40 mg total) by mouth 2 (two) times daily. 05/05/13  Yes Rhetta Mura, MD  sucralfate (CARAFATE) 1 GM/10ML suspension Take 10 mLs  (1 g total) by mouth 4 (four) times daily -  with meals and at bedtime. 05/05/13  Yes Rhetta Mura, MD    Review of Systems:  Constitutional:  No weight loss, night sweats, Fevers, chills, fatigue.  Head&Eyes: No headache.  No vision loss.  No eye pain or scotoma ENT:  No Difficulty swallowing,Tooth/dental problems,Sore throat,   Cardio-vascular:  No chest pain, Orthopnea, PND, palpitations  GI:  No  abdominal pain, nausea, vomiting, diarrhea, loss of appetite, hematochezia, melena, heartburn Resp:  No shortness of breath with exertion or at rest. No cough. No coughing up of blood .No wheezing. Skin:  no rash or lesions.  GU:  no dysuria, change in color of urine, no urgency or frequency. No flank pain.  Musculoskeletal:  No joint pain or swelling. No decreased range of motion. No back pain.  Psych:  No change in mood or affect. No depression or anxiety. Neurologic: No headache, no dysesthesia, no focal weakness, no vision loss.  Physical Exam: Filed Vitals:   05/10/13 2200 05/10/13 2230 05/10/13 2300 05/10/13 2330  BP: 114/36 95/54 104/49 119/62  Pulse: 102 100 100 115  Temp:      TempSrc:      Resp:      Height:      Weight:      SpO2: 100% 98% 100% 100%   General:  A&O x 3, NAD, nontoxic, pleasant/cooperative Head/Eye: No conjunctival hemorrhage, no icterus, [place]/AT, No nystagmus ENT:  No icterus,  No thrush, poor dentition, no pharyngeal exudate Neck:  No masses, no lymphadenpathy, no meningismus CV:  RRR, no rub, no gallop, no S3 Lung:  CTAB, good air movement, no wheeze, no rhonchi Abdomen: soft/NT, +BS, nondistended, no peritoneal signs Ext: No cyanosis, No rashes, No petechiae, No lymphangitis, 2+ LE edema   Labs on Admission:  Basic Metabolic Panel:  Recent Labs Lab 05/10/13 2020 05/10/13 2121  NA 142 141  K 4.0 4.0  CL 109 110  CO2 26 27  GLUCOSE 93 104*  BUN 25* 26*  CREATININE 1.16 1.17  CALCIUM 8.0* 7.5*   Liver Function  Tests:  Recent Labs Lab 05/10/13 2020  AST 17  ALT 13  ALKPHOS 58  BILITOT 0.1*  PROT 4.7*  ALBUMIN 2.1*    Recent Labs Lab 05/10/13 2020  LIPASE 34   No results found for this basename: AMMONIA,  in the last 168 hours CBC:  Recent Labs Lab 05/04/13 0830 05/04/13 1108 05/04/13 2305 05/05/13 0350 05/10/13 2121  WBC 10.4  --   --  9.0 10.7*  NEUTROABS  --   --   --   --  8.5*  HGB 9.2* 8.6* 8.8* 8.4* 4.2*  HCT 27.0* 25.2* 26.1* 25.2* 13.1*  MCV 79.4  --   --  79.2 79.4  PLT 152  --   --  143* 174   Cardiac Enzymes: No results found for this basename: CKTOTAL, CKMB, CKMBINDEX, TROPONINI,  in the last 168 hours BNP: No components found with this basename: POCBNP,  CBG: No results found for this basename: GLUCAP,  in the last 168 hours  Radiological Exams on Admission: No results found.  EKG: Independently reviewed. Sinus tachycardia, no ST-T wave change    Time spent:70 minutes Code Status:  FULL Family Communication:   No Family at bedside   Leobardo Granlund, DO  [facility] Pager [phone]  If 7PM-7AM, please contact night-coverage [email or web address] Password [facility] 05/11/2013, 12:38 AM

## 2013-05-11 NOTE — Progress Notes (Signed)
Patient ID: [name], male   DOB: 02-Sep-1960, 52 y.o.   MRN: [id number] Day of Surgery  Subjective: This patient is well-known to both our [facility] and [facility] services.  He had a duodenal ulcer that was bleeding in January of 2014.  He underwent an ex lap with oversew of duodenal ulcer.  Patient was noncompliant with his protonix and continued to smoke and drink ETOH.  He presented to [facility] 2 weeks ago with melanotic stools.  He was found to have a recurrent duodenal bleeding ulcer.  It was injected, but didn't stop bleeding.  He was then embolized at his GDA.  He remained stable and was ultimately discharged on 05-05-13 with a hgb of 8.5.  The patient began having melanotic stools at home again over the last several days along with dizziness and tingling in his fingers.  He was found to have a hgb of 4.5.  He was admitted.  GI is going to scope him.  We were asked to see him for further evaluation as well.  Objective: Vital signs in last 24 hours: Temp:  [98 F (36.7 C)-99.2 F (37.3 C)] 98.6 F (37 C) (10/11 1240) Pulse Rate:  [96-115] 98 (10/11 1240) Resp:  [16-24] 20 (10/11 1240) BP: (75-123)/(28-62) 111/44 mmHg (10/11 1240) SpO2:  [98 %-100 %] 99 % (10/11 1240) Weight:  [160 lb (72.576 kg)-173 lb 15.1 oz (78.9 kg)] 173 lb 15.1 oz (78.9 kg) (10/11 0214) Last BM Date: 05/11/13  Intake/Output from previous day: 10/10 0701 - 10/11 0700 In: 655 [Blood:655] Out: 500 [Urine:500] Intake/Output this shift: Total I/O In: -  Out: 1002 [Urine:1000; Stool:2]  PE: Abd: soft, NT, ND, +BS Heart: regular Lungs: CTAB  Lab Results:   Recent Labs  05/10/13 2121 05/11/13 0900  WBC 10.7*  --   HGB 4.2* 4.6*  HCT 13.1* 14.1*  PLT 174  --    BMET  Recent Labs  05/10/13 2121 05/11/13 0900  NA 141 143  K 4.0 4.0  CL 110 113*  CO2 27 25  GLUCOSE 104* 109*  BUN 26* 32*  CREATININE 1.17 1.09  CALCIUM 7.5* 7.2*   PT/INR  Recent Labs  05/11/13 0900  LABPROT 15.2  INR 1.23   CMP      Component Value Date/Time   NA 143 05/11/2013 0900   K 4.0 05/11/2013 0900   CL 113* 05/11/2013 0900   CO2 25 05/11/2013 0900   GLUCOSE 109* 05/11/2013 0900   BUN 32* 05/11/2013 0900   CREATININE 1.09 05/11/2013 0900   CALCIUM 7.2* 05/11/2013 0900   PROT 4.7* 05/10/2013 2020   ALBUMIN 2.1* 05/10/2013 2020   AST 17 05/10/2013 2020   ALT 13 05/10/2013 2020   ALKPHOS 58 05/10/2013 2020   BILITOT 0.1* 05/10/2013 2020   GFRNONAA 76* 05/11/2013 0900   GFRAA 88* 05/11/2013 0900   Lipase     Component Value Date/Time   LIPASE 34 05/10/2013 2020       Studies/Results: Dg Chest Port 1 View  05/11/2013   CLINICAL DATA:  Crackles on examination.  EXAM: PORTABLE CHEST - 1 VIEW  COMPARISON:  CHEST x-ray 04/30/2013.  FINDINGS: Lung volumes are low. No consolidative airspace disease. No pleural effusions. Linear opacity in left base, most likely to represent subsegmental atelectasis. There is a small amount of interstitial prominence throughout the medial aspects of the lung apices bilaterally, particularly on the left, similar to prior studies, favored to represent chronic post infectious or inflammatory scarring. No evidence of  pulmonary edema. Heart size is normal. Upper mediastinal contours are within normal limits.  IMPRESSION: 1. Similar appearance of the chest, demonstrating no radiographic evidence of acute cardiopulmonary disease, as discussed above.   Electronically Signed   By: Trudie Reed M.D.   On: 05/11/2013 12:36    Anti-infectives: Anti-infectives   None       Assessment/Plan  1. UGI bleed secondary to duodenal ulcer 2. ETOH abuse 3. Tobacco abuse Patient Active Problem List   Diagnosis Date Noted  . Acute blood loss anemia 05/11/2013  . GIB (gastrointestinal bleeding) 04/30/2013  . Leukocytosis 04/30/2013  . Microcytic anemia 04/30/2013  . Syncope 04/30/2013  . Anemia 04/25/2013  . Acute encephalopathy 04/25/2013  . Anemia due to GI blood loss 08/10/2012   . Acute respiratory failure 08/10/2012  . Hypertension 08/10/2012  . Status post laparotomy with oversew of bleeding duodenal artery 08/10/2012  . Duodenal ulcer 08/10/2012  . Alcohol abuse 08/10/2012  . Encephalopathy acute 08/10/2012   Plan: 1. The patient is getting an upper endoscopy to try and stop his bleeding.  If that fails, the patient will likely need repeat embolization.  He would not be a good surgical candidate given his previous oversew.  Never mind that, the patient refuses any type of surgical intervention.  He stated very clearly to myself as well as the RN, Dawn who was present in the room as well, that even if he was dying, he would NOT sign a consent or want an operation.  He would rather die.  Dr. Derrell Lolling will evaluate the patient as well.  Suspect given the patient's wishes, there is nothing further to offer surgically.   LOS: 1 day    Shafer Swamy E 05/11/2013, 1:22 PM Pager: [phone]

## 2013-05-12 DIAGNOSIS — R55 Syncope and collapse: Secondary | ICD-10-CM

## 2013-05-12 DIAGNOSIS — D62 Acute posthemorrhagic anemia: Secondary | ICD-10-CM

## 2013-05-12 DIAGNOSIS — F1010 Alcohol abuse, uncomplicated: Secondary | ICD-10-CM

## 2013-05-12 DIAGNOSIS — G9340 Encephalopathy, unspecified: Secondary | ICD-10-CM

## 2013-05-12 DIAGNOSIS — K922 Gastrointestinal hemorrhage, unspecified: Secondary | ICD-10-CM

## 2013-05-12 LAB — COMPREHENSIVE METABOLIC PANEL
ALT: 14 U/L (ref 0–53)
AST: 19 U/L (ref 0–37)
Alkaline Phosphatase: 39 U/L (ref 39–117)
CO2: 22 mEq/L (ref 19–32)
Chloride: 114 mEq/L — ABNORMAL HIGH (ref 96–112)
Creatinine, Ser: 1.08 mg/dL (ref 0.50–1.35)
GFR calc non Af Amer: 77 mL/min — ABNORMAL LOW (ref >90)
Glucose, Bld: 119 mg/dL — ABNORMAL HIGH (ref 70–99)
Potassium: 4.1 mEq/L (ref 3.5–5.1)
Sodium: 142 mEq/L (ref 135–145)
Total Bilirubin: 0.4 mg/dL (ref 0.3–1.2)

## 2013-05-12 LAB — HEMOGLOBIN AND HEMATOCRIT, BLOOD
HCT: 20.8 % — ABNORMAL LOW (ref 39.0–52.0)
HCT: 19.3 % — ABNORMAL LOW (ref 39.0–52.0)
Hemoglobin: 6.6 g/dL — CL (ref 13.0–17.0)

## 2013-05-12 LAB — LACTIC ACID, PLASMA: Lactic Acid, Venous: 1.4 mmol/L (ref 0.5–2.2)

## 2013-05-12 LAB — PREPARE RBC (CROSSMATCH)

## 2013-05-12 MED ORDER — FUROSEMIDE 10 MG/ML IJ SOLN
20.0000 mg | Freq: Two times a day (BID) | INTRAMUSCULAR | Status: DC
  Administered 2013-05-12 – 2013-05-13 (×4): 20 mg via INTRAVENOUS
  Filled 2013-05-12 (×2): qty 2

## 2013-05-12 MED ORDER — FUROSEMIDE 10 MG/ML IJ SOLN: 20.0000 mg | Freq: Two times a day (BID) | INTRAMUSCULAR | Status: DC

## 2013-05-12 MED ORDER — FUROSEMIDE 10 MG/ML IJ SOLN
INTRAMUSCULAR | Status: AC
  Administered 2013-05-12: 20 mg
  Filled 2013-05-12: qty 4

## 2013-05-12 MED ORDER — FUROSEMIDE 10 MG/ML IJ SOLN
INTRAMUSCULAR | Status: AC
  Filled 2013-05-12: qty 4

## 2013-05-12 NOTE — Progress Notes (Signed)
1 Day Post-Op  Subjective: He spoke to his brother on the phone this AM, no new C/O  Objective: Vital signs in last 24 hours: Temp:  [97.7 F (36.5 C)-99.2 F (37.3 C)] 97.7 F (36.5 C) (10/12 0800) Pulse Rate:  [71-99] 98 (10/12 0800) Resp:  [15-35] 19 (10/12 0700) BP: (93-130)/(44-74) 130/65 mmHg (10/12 0700) SpO2:  [91 %-100 %] 99 % (10/12 0800) Last BM Date: 05/11/13  Intake/Output from previous day: 10/11 0701 - 10/12 0700 In: -  Out: 2302 [Urine:2300; Stool:2] Intake/Output this shift: Total I/O In: -  Out: 250 [Urine:250]  General appearance: alert Resp: clear to auscultation bilaterally GI: soft, NT  Lab Results:   Recent Labs  05/10/13 2121  05/12/13 0237 05/12/13 0900  WBC 10.7*  --   --   --   HGB 4.2*  < > 7.2* 6.6*  HCT 13.1*  < > 20.8* 19.3*  PLT 174  --   --   --   < > = values in this interval not displayed. BMET  Recent Labs  05/11/13 0900 05/12/13 0237  NA 143 142  K 4.0 4.1  CL 113* 114*  CO2 25 22  GLUCOSE 109* 119*  BUN 32* 26*  CREATININE 1.09 1.08  CALCIUM 7.2* 7.3*   PT/INR  Recent Labs  05/11/13 0900  LABPROT 15.2  INR 1.23   ABG No results found for this basename: PHART, PCO2, PO2, HCO3,  in the last 72 hours  Studies/Results: Dg Chest Port 1 View  05/11/2013   CLINICAL DATA:  Crackles on examination.  EXAM: PORTABLE CHEST - 1 VIEW  COMPARISON:  CHEST x-ray 04/30/2013.  FINDINGS: Lung volumes are low. No consolidative airspace disease. No pleural effusions. Linear opacity in left base, most likely to represent subsegmental atelectasis. There is a small amount of interstitial prominence throughout the medial aspects of the lung apices bilaterally, particularly on the left, similar to prior studies, favored to represent chronic post infectious or inflammatory scarring. No evidence of pulmonary edema. Heart size is normal. Upper mediastinal contours are within normal limits.  IMPRESSION: 1. Similar appearance of the chest,  demonstrating no radiographic evidence of acute cardiopulmonary disease, as discussed above.   Electronically Signed   By: Trudie Reed M.D.   On: 05/11/2013 12:36    Anti-infectives: Anti-infectives   None      Assessment/Plan: s/p Procedure(s): ESOPHAGOGASTRODUODENOSCOPY (EGD) (N/A) UGI bleed - recurrent - I spoke to him about considering surgery. He again refuses any surgery, He is willing to have endoscopy but will not allow any surgery. We will sign off. Violeta Gelinas, MD, MPH, FACS Pager: [phone]   LOS: 2 days    Violeta Gelinas E 05/12/2013

## 2013-05-12 NOTE — Progress Notes (Signed)
PULMONARY  / CRITICAL CARE MEDICINE  Name: [name] MRN: [id number] DOB: 10/20/1960    ADMISSION DATE:  05/10/2013 CONSULTATION DATE:  05/11/13  REFERRING MD :  Dr Wandalee Ferdinand GI PRIMARY SERVICE: [facility]  CHIEF COMPLAINT:  Acute GI bleed.   HPI: 52 year old [place] American male with a history of cocaine abuse and tobacco abuse. He is an alcoholic with continued alcohol consumption. Presented in 04/20/2013 with a hemoglobin of 2.7 g percent and acute encephalopathy but left AGAINST MEDICAL ADVICE 04/29/2013 after receiving 6 units of packed red blood cell.. Readmitted 04/30/2013 with a hemoglobin of 5.7 g percent and dizziness. September early October 2014 with acute GI bleed. Endoscopy 05/01/2013 showed a bleeding duodenal ulcer but no esophageal varices. Failed epinephrine local injection. Subsequently bleeding controlled by arterial embolization by interventional radiology. He was then discharged 05/05/13  . However, returns 05/10/2013  with ongoing melena hemoglobin of 4 g percent but was hemodynamically stable except for tachycardia This hemoglobin did not improve despite 2 units of packed red blood cells. On the morning of 05/11/2013 blood pressure okay but tachycardic at 110 per minute. GI service is planning to do emergency endoscopy with backup plan for surgical consultation. Critical care medicine has been consulted for hemodynamic support  Noted 05/01/13  pscych consult - patient did not have capcacitya t that time    SIGNIFICANT EVENTS / STUDIES:  Admitted 05/10/2013  LINES / TUBES: PIV  CULTURES: Results for orders placed during the hospital encounter of 04/30/13  MRSA PCR SCREENING     Status: None   Collection Time    04/30/13  6:42 PM      Result Value Range Status   MRSA by PCR NEGATIVE  NEGATIVE Final   Comment:            The GeneXpert MRSA Assay (FDA     approved for NASAL specimens     only), is one component of a     comprehensive MRSA colonization      surveillance program. It is not     intended to diagnose MRSA     infection nor to guide or     monitor treatment for     MRSA infections.     Performed at [facility]  URINE CULTURE     Status: None   Collection Time    04/30/13  9:02 PM      Result Value Range Status   Specimen Description URINE, CLEAN CATCH   Final   Special Requests NONE   Final   Culture  Setup Time     Final   Value: 05/01/2013 03:01     Performed at [facility] Count     Final   Value: 15,000 COLONIES/ML     Performed at [facility]   Culture     Final   Value: Multiple bacterial morphotypes present, none predominant. Suggest appropriate recollection if clinically indicated.     Performed at [facility]   Report Status 05/02/2013 FINAL   Final     ANTIBIOTICS: Anti-infectives   None         PAST MEDICAL HISTORY :  Past Medical History  Diagnosis Date  . Abdominal  pain, other specified site   . GERD (gastroesophageal reflux disease)   . Abnormal CT scan, esophagus 03/2012  . Substance abuse 03/2012    tox screen positive for cocaine.    Past Surgical History  Procedure Laterality Date  .  Undescended testicle      on right. noted on 03/2012 CT scan.  no surgery referral.   . Esophagogastroduodenoscopy  08/10/2012    Procedure: ESOPHAGOGASTRODUODENOSCOPY (EGD);  Surgeon: [name], MD;  Location: [facility] ENDOSCOPY;  Service: Endoscopy;  Laterality: N/A;  will be done in room 1   . Laparotomy  08/10/2012    Procedure: EXPLORATORY LAPAROTOMY;  Surgeon: [name], MD;  Location: [facility] OR;  Service: General;  Laterality: N/A;  . Esophagogastroduodenoscopy Left 05/01/2013    Procedure: ESOPHAGOGASTRODUODENOSCOPY (EGD);  Surgeon: [name], MD;  Location: [facility] ENDOSCOPY;  Service: Endoscopy;  Laterality: Left;   Prior to Admission medications   Medication Sig Start Date End Date Taking? Authorizing Provider  pantoprazole (PROTONIX) 40 MG tablet Take 1  tablet (40 mg total) by mouth 2 (two) times daily. 05/05/13  Yes Rhetta Mura, MD  sucralfate (CARAFATE) 1 GM/10ML suspension Take 10 mLs (1 g total) by mouth 4 (four) times daily -  with meals and at bedtime. 05/05/13  Yes Rhetta Mura, MD   Allergies  Allergen Reactions  . Chocolate Nausea And Vomiting  . Peanut-Containing Drug Products Nausea And Vomiting  . Spinach Nausea And Vomiting   Interval: Stable overnight hemodynamically, but hgb still drifting.  No increased wob.  Remains confused.   VITAL SIGNS: Temp:  [97.7 F (36.5 C)-99.1 F (37.3 C)] 98.2 F (36.8 C) (10/12 1115) Pulse Rate:  [71-99] 98 (10/12 0800) Resp:  [15-35] 19 (10/12 0700) BP: (93-130)/(44-74) 130/65 mmHg (10/12 0700) SpO2:  [91 %-100 %] 99 % (10/12 1115) HEMODYNAMICS:   VENTILATOR SETTINGS:   INTAKE / OUTPUT: Intake/Output     10/11 0701 - 10/12 0700 10/12 0701 - 10/13 0700   I.V. (mL/kg)  3 (0)   Blood     Total Intake(mL/kg)  3 (0)   Urine (mL/kg/hr) 2300 (1.2) 250 (0.7)   Stool 2 (0)    Total Output 2302 250   Net -2302 -247        Stool Occurrence 2 x      PHYSICAL EXAMINATION: General:  Confused male, nad Neuro: doesn't answer question, mildly agitated. HEENT:  Nose without purulence or discharge, no TMG or LN Cardiovascular:  Mildly tachy, but regular. Lungs:  No respiratory distress. Normal work of breathing. Decreased depth inspiration with mild crackles. Abdomen:  Soft, nontender. Has a scar of previous laparotomy Musculoskeletal:  No cyanosis no clubbing no pedal edema   CBC  Recent Labs Lab 05/10/13 2121  05/11/13 2056 05/12/13 0237 05/12/13 0900  HGB 4.2*  < > 7.6* 7.2* 6.6*  HCT 13.1*  < > 21.2* 20.8* 19.3*  WBC 10.7*  --   --   --   --   PLT 174  --   --   --   --   < > = values in this interval not displayed.    ASSESSMENT / PLAN:  PULMONARY A: stable pulmonary status P:   Monitor resp status, especially if pt begins to have etoh  w/d.  CARDIOVASCULAR A:  Maintaing BP but HR 100 wit GI Bleed P: transfuse as needed, keep volume resuscitated.   GASTROINTESTINAL A:  REccurent GI bleed. Failed  IR embolization and endo epi injection. Poor compliance patient P:   GI following

## 2013-05-12 NOTE — Progress Notes (Signed)
Lab came to draw pt blood. Pt refused. I went in to talk with him and he still refuses to have his blood drawn. Pt states " he doesn't have any blood to give". Notified Lenny Pastel on call for [facility]. Will continue to monitor the pt. Currently in no acute distress.

## 2013-05-12 NOTE — Progress Notes (Signed)
Patients Hgb level came back as 6.6, called and spoke with DR.Lama, new orders written for 2 units of PRBC to be transfused. Will continue to monitor patient.

## 2013-05-12 NOTE — Progress Notes (Signed)
The patient's hemoglobin remains around the 7 g range. He has received further blood transfusions since his endoscopy with epinephrine injection yesterday which I believe temporarily stopped or slowed his bleeding but certainly did not take care of the problem longer-term. He has refused surgery even though it has been recommended to him as a definitive method of stopping the bleeding from the ulcer. He has failed definitive endoscopic treatment, and he has had prior embolization of the gastroduodenal artery which has also failed in controlling bleeding, as he bled again after this was done. Interventional radiologist Dr. Lowella Dandy did not feel that further embolization would be of benefit. The patient has spoken with some family members who have tried to convince him to have surgery however he refuses. I spoke to his brother [name] yesterday and Dr. Sharl Ma has also spoken to the family members. I spoke with Dr. Sharl Ma today who feels that getting a palliative care consult would be appropriate to determine patient's wishes.

## 2013-05-12 NOTE — Progress Notes (Signed)
Spoke both yesterday and today with patients brother [name] [place], in [place], [phone]) updated him on patients condition. Talked about patients decision not to have surgery and how he doesn't care if he dies". Pt brother stated that he has been trying to talk to him for years about his lifestyle , and that he can't force him to have surgery, but he would try and talk with patient.

## 2013-05-12 NOTE — Progress Notes (Signed)
[facility] PROGRESS NOTE  [name] ZOX:[id number] DOB: [birth year] DOA: 05/10/2013 PCP: [name], MD  Interval history 52 year old male with a history of duodenal ulcer, upper GI bleed, alcohol dependence presents with 4-5 day history of lower extremity weakness, dizziness, and a fall due to LE weakness. The patient has also been complaining of melanotic stools since discharge from the hospital on 05/05/2013. The patient had a maroon stool while in the emergency department this evening. 05/01/2013, the patient underwent EGD performed by Dr. [name] whom injected a visible vessel with 6cc epinephrine. On 05/03/2013, the patient underwent embolization of the gastroduodenal artery. The patient also had an admission on 04/25/2013 during which he received 6 units of PRBCs, but left AGAINST MEDICAL ADVICE on 04/29/2013. He was discharged on 05/05/2013 with instructions to continue Carafate and Protonix. Unfortunately, the patient did not take his medications. Patient also has a history of a oversewn duodenal ulcer performed on January 2014. He denies any NSAID use. He denies any alcohol or illegal drug use since his last discharge. Patient was found to be profoundly anemic with hemoglobin of 4.2 and received 5 units of blood transfusion yesterday, today the hemoglobin has improved to 7.2, patient underwent EGD yesterday and received epinephrine injections at the ulcer base, endoclip could not be placed, patient was offered surgery but he has declined surgery at this time. Dr. Evette Cristal also discussed with IR, for possible repeat angiography with embolization as patient had embolization last week IR did not feel the need for repeat embolization and they have not confident that it would stop the bleeding.   Assessment/Plan: 1. *Anemia- secondary to acute GI bleed patient got 5 units of  PRBC, the hemoglobin has improved to 7.2, hematocrit 20.8. We'll continue to monitor the H&H every 6 hours. And  transfuse if hemoglobin drops less than 7.  2. UGI Bleed- Patient has bleeding duodenal ulcer, status post EGD with epinephrine injection no further arterial embolization by interventional radiology, patient has refused surgery.   3.  Pulmonary edema- patient has bilateral crackles on auscultation, he also has swelling of the hands and lower extremities likely due to fluid overload and also multiple blood transfusions. We'll start him on Lasix 20 mg IV every 12 hours   4. Peripheral neuropathy- ? alcohol induced vs anemia. Will reevaluate after blood transfusion.  5. Syncope-patient had a fall due to lower extremity weakness, could have been syncope or weakness due to severe anemia. Patient will monitored on telemetry, EKG showed sinus tachycardia.  6. Protein calorie malnutrition- patient's albumin is 2.1, he does have lower extremity edema which could be due to hypoalbuminemia. 2-D       Echocardiogram has been ordered to evaluate patient's ejection fraction.  7. DVT prophylaxis- SCDs  Code Status: *Full code  Family Communication: *Discussed with patient in detail the risks of not having surgery and his chance of recurrent bleeding from the duodenal ulcer. Patient at this time is refusing surgery he understands the risks very well, he says he is ready to die when his time comes, though he also feels that he can always come back to the hospital if he gets dizzy again.  Called on phone and discussed with patient's 2 brothers [name] in [place] and [name] in [place], explained the difficult situation and patient's refusal for surgery. The family is very concerned with patient's refusal and are going to talk to patient today regarding the surgery.  In the meantime patient is requiring multiple blood transfusions .  We'll have to get palliative care consult for detailed discussion with family regarding patient's goals of care.  Disposition Plan:  *TBD   Consultants:  *GI  Procedures:  None  Antibiotics:  *  HPI/Subjective: *Patient seen and examined, admitted with severe anemia, secondary to GI bleed. Status post endoscopy. Complains of swelling of the hands, numbness and tingling of both the hands going on for a week.  Objective: Filed Vitals:   05/12/13 0800  BP:   Pulse: 98  Temp: 97.7 F (36.5 C)  Resp:     Intake/Output Summary (Last 24 hours) at 05/12/13 0924 Last data filed at 05/12/13 0800  Gross per 24 hour  Intake      0 ml  Output   1851 ml  Net  -1851 ml   Filed Weights   05/10/13 2010 05/11/13 0214  Weight: 72.576 kg (160 lb) 78.9 kg (173 lb 15.1 oz)    Exam:  Physical Exam: Head: Normocephalic, atraumatic.  Eyes: No signs of jaundice, EOMI Nose: Mucous membranes dry.  Throat: Oropharynx nonerythematous, no exudate appreciated.  Neck: supple,No deformities, masses, or tenderness noted. Lungs: Normal respiratory effort. B/L crackles on auscultation Heart: Regular RR. S1 and S2 normal  Abdomen: BS normoactive. Soft, Nondistended, non-tender.  Extremities: Trace edema in the lower extremities, mild edema in both the hands. Neurological- sensations on both the hands are intact, no limitation of movement of the upper extremities. Strength 5 of 5 both upper extremities.  Data Reviewed: Basic Metabolic Panel:  Recent Labs Lab 05/10/13 2020 05/10/13 2121 05/11/13 0900 05/12/13 0237  NA 142 141 143 142  K 4.0 4.0 4.0 4.1  CL 109 110 113* 114*  CO2 26 27 25 22   GLUCOSE 93 104* 109* 119*  BUN 25* 26* 32* 26*  CREATININE 1.16 1.17 1.09 1.08  CALCIUM 8.0* 7.5* 7.2* 7.3*   Liver Function Tests:  Recent Labs Lab 05/10/13 2020 05/12/13 0237  AST 17 19  ALT 13 14  ALKPHOS 58 39  BILITOT 0.1* 0.4  PROT 4.7* 3.7*  ALBUMIN 2.1* 1.7*    Recent Labs Lab 05/10/13 2020  LIPASE 34   No results found for this basename: AMMONIA,  in the last 168 hours CBC:  Recent Labs Lab  05/10/13 2121 05/11/13 0900 05/11/13 1835 05/11/13 2056 05/12/13 0237  WBC 10.7*  --   --   --   --   NEUTROABS 8.5*  --   --   --   --   HGB 4.2* 4.6* 7.6* 7.6* 7.2*  HCT 13.1* 14.1* 21.6* 21.2* 20.8*  MCV 79.4  --   --   --   --   PLT 174  --   --   --   --    Cardiac Enzymes:  Recent Labs Lab 05/12/13 0237  TROPONINI <0.30   BNP (last 3 results) No results found for this basename: PROBNP,  in the last 8760 hours CBG: No results found for this basename: GLUCAP,  in the last 168 hours  No results found for this or any previous visit (from the past 240 hour(s)).   Studies: Dg Chest Port 1 View  05/11/2013   CLINICAL DATA:  Crackles on examination.  EXAM: PORTABLE CHEST - 1 VIEW  COMPARISON:  CHEST x-ray 04/30/2013.  FINDINGS: Lung volumes are low. No consolidative airspace disease. No pleural effusions. Linear opacity in left base, most likely to represent subsegmental atelectasis. There is a small amount of interstitial prominence throughout the medial aspects of  the lung apices bilaterally, particularly on the left, similar to prior studies, favored to represent chronic post infectious or inflammatory scarring. No evidence of pulmonary edema. Heart size is normal. Upper mediastinal contours are within normal limits.  IMPRESSION: 1. Similar appearance of the chest, demonstrating no radiographic evidence of acute cardiopulmonary disease, as discussed above.   Electronically Signed   By: Trudie Reed M.D.   On: 05/11/2013 12:36    Scheduled Meds: . folic acid  1 mg Oral Daily  . furosemide      . furosemide  20 mg Intravenous BID  . LORazepam  0-4 mg Intravenous Q6H   Followed by  . [START ON 05/13/2013] LORazepam  0-4 mg Intravenous Q12H  . multivitamin with minerals  1 tablet Oral Daily  . [START ON 05/14/2013] pantoprazole (PROTONIX) IV  40 mg Intravenous Q12H  . sodium chloride  3 mL Intravenous Q12H  . thiamine  100 mg Oral Daily   Or  . thiamine  100 mg  Intravenous Daily   Continuous Infusions: . sodium chloride 20 mL/hr at 05/12/13 0600  . octreotide (SANDOSTATIN) infusion 150 mcg/hr (05/12/13 1610)  . pantoprozole (PROTONIX) infusion 8 mg/hr (05/12/13 0600)    Active Problems:   Alcohol abuse   GIB (gastrointestinal bleeding)   Syncope   Acute blood loss anemia    Time spent: *25 min    [facility]  [facility] Pager [phone]*. If 7PM-7AM, please contact night-coverage at [email or web address], password [facility] 05/12/2013, 9:24 AM  LOS: 2 days

## 2013-05-12 NOTE — Progress Notes (Signed)
Patient ID: [name], male   DOB: 02/03/1961, 52 y.o.   MRN: [id number]   Pt known to IR Duodenal ulcer; bleed empiric gastroduodenal artery embolization 05/03/2013  10/10 H/H: 4.2/13.1 10/12 H/H: 7.2/20.8; 6.6/19.3 VSS; 130/65  Discussed with Dr Lowella Dandy Pt is on IR radar If MD pt feels pt is actively bleeding could reconsider angiogram-- Although treatment options would be limited  Let us know if you need [place].

## 2013-05-13 ENCOUNTER — Encounter (HOSPITAL_COMMUNITY): Payer: Self-pay | Admitting: Gastroenterology

## 2013-05-13 DIAGNOSIS — I369 Nonrheumatic tricuspid valve disorder, unspecified: Secondary | ICD-10-CM

## 2013-05-13 LAB — CBC
Hemoglobin: 8.7 g/dL — ABNORMAL LOW (ref 13.0–17.0)
MCV: 83 fL (ref 78.0–100.0)
Platelets: 229 10*3/uL (ref 150–400)
RBC: 3.05 MIL/uL — ABNORMAL LOW (ref 4.22–5.81)
RDW: 15.6 % — ABNORMAL HIGH (ref 11.5–15.5)
WBC: 9.3 10*3/uL (ref 4.0–10.5)

## 2013-05-13 LAB — BASIC METABOLIC PANEL
BUN: 20 mg/dL (ref 6–23)
Calcium: 7.4 mg/dL — ABNORMAL LOW (ref 8.4–10.5)
GFR calc Af Amer: 71 mL/min — ABNORMAL LOW (ref >90)
GFR calc non Af Amer: 61 mL/min — ABNORMAL LOW (ref >90)
Potassium: 3.5 mEq/L (ref 3.5–5.1)
Sodium: 142 mEq/L (ref 135–145)

## 2013-05-13 LAB — GLUCOSE, CAPILLARY: Glucose-Capillary: 91 mg/dL (ref 70–99)

## 2013-05-13 LAB — HEMOGLOBIN AND HEMATOCRIT, BLOOD
HCT: 24.9 % — ABNORMAL LOW (ref 39.0–52.0)
Hemoglobin: 8.6 g/dL — ABNORMAL LOW (ref 13.0–17.0)

## 2013-05-13 MED ORDER — LORAZEPAM 1 MG PO TABS: 1.0000 mg | ORAL_TABLET | Freq: Four times a day (QID) | ORAL | Status: DC | PRN

## 2013-05-13 MED ORDER — LORAZEPAM 2 MG/ML IJ SOLN: 1.0000 mg | Freq: Four times a day (QID) | INTRAMUSCULAR | Status: DC | PRN

## 2013-05-13 MED ORDER — LORAZEPAM 2 MG/ML IJ SOLN: 1.0000 mg | INTRAMUSCULAR | Status: DC | PRN

## 2013-05-13 MED ORDER — KCL IN DEXTROSE-NACL 20-5-0.9 MEQ/L-%-% IV SOLN
INTRAVENOUS | Status: DC
  Administered 2013-05-13 – 2013-05-15 (×4): via INTRAVENOUS
  Filled 2013-05-13 (×6): qty 1000

## 2013-05-13 MED ORDER — LORAZEPAM 1 MG PO TABS: 1.0000 mg | ORAL_TABLET | ORAL | Status: DC | PRN

## 2013-05-13 MED ORDER — HALOPERIDOL LACTATE 5 MG/ML IJ SOLN
5.0000 mg | Freq: Once | INTRAMUSCULAR | Status: AC
  Administered 2013-05-13: 5 mg via INTRAVENOUS
  Filled 2013-05-13: qty 1

## 2013-05-13 MED ORDER — FUROSEMIDE 10 MG/ML IJ SOLN
INTRAMUSCULAR | Status: AC
  Filled 2013-05-13: qty 4

## 2013-05-13 NOTE — Progress Notes (Signed)
PULMONARY  / CRITICAL CARE MEDICINE  Name: [name] MRN: [id number] DOB: [birth year]    ADMISSION DATE:  05/10/2013 CONSULTATION DATE:  05/11/13  REFERRING MD :  Dr Wandalee Ferdinand GI PRIMARY SERVICE: [facility]  CHIEF COMPLAINT:  Acute GI bleed.   HPI: 52 year old [place] American male with a history of cocaine abuse and tobacco abuse. He is an alcoholic with continued alcohol consumption. Presented in 04/20/2013 with a hemoglobin of 2.7 g percent and acute encephalopathy but left AGAINST MEDICAL ADVICE 04/29/2013 after receiving 6 units of packed red blood cell.. Readmitted 04/30/2013 with a hemoglobin of 5.7 g percent and dizziness. September early October 2014 with acute GI bleed. Endoscopy 05/01/2013 showed a bleeding duodenal ulcer but no esophageal varices. Failed epinephrine local injection. Subsequently bleeding controlled by arterial embolization by interventional radiology. He was then discharged 05/05/13  . However, returned 05/10/2013  with ongoing melena hemoglobin of 4 g percent  Noted 05/01/13  pscych consult - patient did not have capcacity at that time Underwent empiric gastroduodenal artery embolization 05/03/2013    SIGNIFICANT EVENTS / STUDIES:  Admitted 05/10/2013  LINES / TUBES: PIV  Interval: No increased wob.  Remains confused. 'discharge me - I want to go to [facility]'   VITAL SIGNS: Temp:  [98.1 F (36.7 C)-98.8 F (37.1 C)] 98.4 F (36.9 C) (10/13 0800) Pulse Rate:  [66-98] 72 (10/13 0800) Resp:  [13-18] 18 (10/13 0352) BP: (106-142)/(54-81) 131/76 mmHg (10/13 1000) SpO2:  [97 %-100 %] 100 % (10/13 0800) HEMODYNAMICS:   VENTILATOR SETTINGS:   INTAKE / OUTPUT: Intake/Output     10/12 0701 - 10/13 0700 10/13 0701 - 10/14 0700   I.V. (mL/kg) 3 (0)    Total Intake(mL/kg) 3 (0)    Urine (mL/kg/hr) 5300 (2.8) 550 (1.6)   Stool 1 (0)    Total Output 5301 550   Net -5298 -550          PHYSICAL EXAMINATION: General:  Confused male,  nad Neuro: doesn't answer question, mildly agitated. HEENT:  Nose without purulence or discharge, no TMG or LN Cardiovascular:  Mildly tachy, but regular. Lungs:  No respiratory distress. Normal work of breathing. Decreased depth inspiration with mild crackles. Abdomen:  Soft, nontender. Has a scar of previous laparotomy Musculoskeletal:  No cyanosis no clubbing no pedal edema   CBC  Recent Labs Lab 05/10/13 2121  05/12/13 0237 05/12/13 0900 05/13/13 0250  HGB 4.2*  < > 7.2* 6.6* 8.6*  HCT 13.1*  < > 20.8* 19.3* 24.9*  WBC 10.7*  --   --   --   --   PLT 174  --   --   --   --   < > = values in this interval not displayed.    ASSESSMENT / PLAN:  PULMONARY A: stable  P:   Monitor  CARDIOVASCULAR A:  Maintaing BP P: transfuse as needed, keep volume resuscitated.   GASTROINTESTINAL A:  REccurent GI bleed. Failed  IR embolization and endo epi injection. Poor compliance patient P:   GI following  Neuro - ETOH withdrawal Ativan per CIWA  PCCM to sign off, pl call as needed I doubt he has capacity  Cyril Mourning MD. Tonny Bollman. [place] Pager [phone] If no response call [phone]

## 2013-05-13 NOTE — Progress Notes (Signed)
  This NP ngaged with patient at bedside.  On review of medical records, complicated psych-mental health history and social history.  Patient was unable/unwilling to engaged in conversation to secure capacity.  He was unable to verbalize understanding of his medical situation and consequences of his choices.   Recommend psych consult for capacity and need to secure guardianship.  Will reengage once capacity determined.  Lorinda Creed NP  Palliative Medicine Team Team Phone # [phone] Pager [phone]    Discussed with Dr Sharon Seller

## 2013-05-13 NOTE — Progress Notes (Signed)
[facility] Progress Note [place] TEAM 1 - Stepdown/ICU TEAM   Lamonta Cypress ZOX:[id number] DOB: [birth year] DOA: 05/10/2013 PCP: [name], MD  Admit HPI / Brief Narrative: 52 year old male with a history of duodenal ulcer, upper GI bleed, alcohol dependence who presented with 4-5 day history of lower extremity weakness, dizziness, and a fall. The patient had also been complaining of melanotic stools since discharge from the hospital on 05/05/2013. The patient had a maroon stool while in the emergency department. 05/01/2013, the patient underwent EGD performed by Dr. [name] who injected a visible vessel with 6cc epinephrine. On 05/03/2013, the patient underwent embolization of the gastroduodenal artery. He was discharged on 05/05/2013 with instructions to continue Carafate and Protonix. Unfortunately, the patient did not take his medications. The patient also had an admission on 04/25/2013 during which he received 6 units of PRBCs, but left AGAINST MEDICAL ADVICE on 04/29/2013. Patient also has a history of a oversewn duodenal ulcer performed on January 2014. He denied any NSAID use. He denied any alcohol or illegal drug use since his last discharge.   After admission the patient was found to be profoundly anemic with hemoglobin of 4.2 and received 5 units of blood.  He underwent EGD and received epinephrine injections at the ulcer base (endoclip could not be placed).  The patient was offered surgery but he has declined surgery at this time. Dr. Evette Cristal also discussed with IR, for possible repeat angiography with embolization as patient had embolization previously.  IR did not feel confident that it would stop the bleeding.   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 - has now been injected w/ epi x 2 and failed an attempt at selective arterial embolization - pt refuses surgery, but is also confused/beligerent - asking Pscyh to eval for  competency - if is deemed incompetent again, will have to consider asking next-of-kin to consent to surgery   Acute severe blood loss anemia Hgb had improved this morning - pt refused f/u lab draw - once pt more sedate, will attempt to redraw  EtOH abuse - acute delirium Suspect EtOH withdrawal  is the cause of his severe agitation - cont ativan per CIWA protocol - may require precedex if does not respond - one time dose of haldol to attempt to get ahead of his severe agitation, but will not repeat due to borderline/elevated QTc - Psych consulted to assess competence as pt is threatening to leave AMA and is refusing life-saving surgery claiming he is "ready to die"  Polysubstance abuse  Includes tobacco, alcohol, previous cocaine  Code Status: FULL Family Communication: no family present at time of exam Disposition Plan: SDU - may require transfer to ICU for precedex  Consultants: GI Gen Surgery   Procedures: none  Antibiotics: none  DVT prophylaxis: lovenox  HPI/Subjective: The patient is quite belligerent.  He refuses to answer specific orientation questions but repeatedly tells me "let me go so I can go to the hospital."  When I ask him where he is he will not answer many.  He reports that he wants help but than simply refuses all attempts to help him.  There is no family present at time of my exam the patient refuses to allow me to physically examine him.  Objective: Blood pressure 118/71, pulse 72, temperature 98.4 F (36.9 C), temperature source Oral, resp. rate 18, height 5\' 9"  (1.753 m), weight 78.9 kg (173 lb 15.1 oz), SpO2 100.00%.  Intake/Output Summary (Last 24 hours)  at 05/13/13 1521 Last data filed at 05/13/13 1212  Gross per 24 hour  Intake      0 ml  Output   3676 ml  Net  -3676 ml   Exam: General: The patient is quite agitated and argumentative and is clearly quite confused.  He refuses to allow physical exam   Data Reviewed: Basic Metabolic  Panel:  Recent Labs Lab 05/10/13 2020 05/10/13 2121 05/11/13 0900 05/12/13 0237 05/13/13 0250  NA 142 141 143 142 142  K 4.0 4.0 4.0 4.1 3.5  CL 109 110 113* 114* 111  CO2 26 27 25 22 25   GLUCOSE 93 104* 109* 119* 89  BUN 25* 26* 32* 26* 20  CREATININE 1.16 1.17 1.09 1.08 1.31  CALCIUM 8.0* 7.5* 7.2* 7.3* 7.4*   Liver Function Tests:  Recent Labs Lab 05/10/13 2020 05/12/13 0237  AST 17 19  ALT 13 14  ALKPHOS 58 39  BILITOT 0.1* 0.4  PROT 4.7* 3.7*  ALBUMIN 2.1* 1.7*    Recent Labs Lab 05/10/13 2020  LIPASE 34   CBC:  Recent Labs Lab 05/10/13 2121  05/11/13 1835 05/11/13 2056 05/12/13 0237 05/12/13 0900 05/13/13 0250  WBC 10.7*  --   --   --   --   --   --   NEUTROABS 8.5*  --   --   --   --   --   --   HGB 4.2*  < > 7.6* 7.6* 7.2* 6.6* 8.6*  HCT 13.1*  < > 21.6* 21.2* 20.8* 19.3* 24.9*  MCV 79.4  --   --   --   --   --   --   PLT 174  --   --   --   --   --   --   < > = values in this interval not displayed. Cardiac Enzymes:  Recent Labs Lab 05/12/13 0237  TROPONINI <0.30   CBG:  Recent Labs Lab 05/13/13 1252  GLUCAP 91   Studies:  Recent x-ray studies have been reviewed in detail by the Attending Physician  Scheduled Meds:  Scheduled Meds: . folic acid  1 mg Oral Daily  . furosemide  20 mg Intravenous BID  . LORazepam  0-4 mg Intravenous Q12H  . multivitamin with minerals  1 tablet Oral Daily  . [START ON 05/14/2013] pantoprazole (PROTONIX) IV  40 mg Intravenous Q12H  . sodium chloride  3 mL Intravenous Q12H  . thiamine  100 mg Oral Daily   Or  . thiamine  100 mg Intravenous Daily    Time spent on care of this patient: 35 mins   [facility] T  [facility] Office  [phone] Pager - Text Page per Loretha Stapler as per below:  On-Call/Text Page:      [email or web address]      password [email or web address]  If 7PM-7AM, please contact night-coverage [email or web address] Password [email or web address] 05/13/2013, 3:21 PM   LOS: 3 days

## 2013-05-13 NOTE — Progress Notes (Signed)
  Echocardiogram 2D Echocardiogram has been performed.  [name] 05/13/2013, 9:25 AM

## 2013-05-13 NOTE — Consult Note (Signed)
Reason for Consult: Competency evaluation Referring Physician: Dr. Mertha Baars Venezia is an 52 y.o. male.  HPI: 52 year old male  AA male in his bed with soft restraints. He has stated that he does not know why he was in hospital except abdominal pain and he was unaware of severity of his medical condition at this time. He denied current alcohol and drug abuse. He has limited insight into his treatment needs. He stated that today is Sunday. He is oriented to his name, age, place and person. He does not know why restrained placed but feels he has been in hospital over a week. He states that he has no family but staying with different friends here and there.  Reportedly patient has been irritable, agitated and cursing at staff and trying to leave the hospital. He has pulled all his IV line and cardiac leads.    Mental Status Examination: Patient appeared disheveled, poor grooming, unshaven beard and has restrained and asking to remove. He is asking to use the bath room when he has catheter in place and he has IVF and IV medications. He has poor eye contact and depressed mood and constricted affect. He has speaking limited and partially cooperative with the visit. Patient has denied suicidal, homicidal ideations, intentions or plans. Patient has no evidence of auditory or visual hallucinations, delusions, and paranoia. Patient has fair to poor insight judgment and impulse control.  Past Medical History  Diagnosis Date  . Abdominal  pain, other specified site   . GERD (gastroesophageal reflux disease)   . Abnormal CT scan, esophagus 03/2012  . Substance abuse 03/2012    tox screen positive for cocaine.     Past Surgical History  Procedure Laterality Date  . Undescended testicle      on right. noted on 03/2012 CT scan.  no surgery referral.   . Esophagogastroduodenoscopy  08/10/2012    Procedure: ESOPHAGOGASTRODUODENOSCOPY (EGD);  Surgeon: [name], MD;  Location: [facility] ENDOSCOPY;  Service:  Endoscopy;  Laterality: N/A;  will be done in room 1   . Laparotomy  08/10/2012    Procedure: EXPLORATORY LAPAROTOMY;  Surgeon: [name], MD;  Location: [facility] OR;  Service: General;  Laterality: N/A;  . Esophagogastroduodenoscopy Left 05/01/2013    Procedure: ESOPHAGOGASTRODUODENOSCOPY (EGD);  Surgeon: [name], MD;  Location: [facility] ENDOSCOPY;  Service: Endoscopy;  Laterality: Left;  . Esophagogastroduodenoscopy N/A 05/11/2013    Procedure: ESOPHAGOGASTRODUODENOSCOPY (EGD);  Surgeon: [name], MD;  Location: [facility] ENDOSCOPY;  Service: Endoscopy;  Laterality: N/A;    History reviewed. No pertinent family history.  Social History:  reports that he has been smoking.  He has never used smokeless tobacco. He reports that he drinks alcohol. He reports that he uses illicit drugs (Marijuana).  Allergies:  Allergies  Allergen Reactions  . Chocolate Nausea And Vomiting  . Peanut-Containing Drug Products Nausea And Vomiting  . Spinach Nausea And Vomiting    Medications: I have reviewed the patient's current medications.  Results for orders placed during the hospital encounter of 05/10/13 (from the past 48 hour(s))  HEMOGLOBIN AND HEMATOCRIT, BLOOD     Status: Abnormal   Collection Time    05/11/13  6:35 PM      Result Value Range   Hemoglobin 7.6 (*) 13.0 - 17.0 g/dL   Comment: DELTA CHECK NOTED     REPEATED TO VERIFY   HCT 21.6 (*) 39.0 - 52.0 %  HEMOGLOBIN AND HEMATOCRIT, BLOOD     Status: Abnormal  Collection Time    05/11/13  8:56 PM      Result Value Range   Hemoglobin 7.6 (*) 13.0 - 17.0 g/dL   HCT 40.9 (*) 81.1 - 91.4 %  HEMOGLOBIN AND HEMATOCRIT, BLOOD     Status: Abnormal   Collection Time    05/12/13  2:37 AM      Result Value Range   Hemoglobin 7.2 (*) 13.0 - 17.0 g/dL   HCT 78.2 (*) 95.6 - 21.3 %  COMPREHENSIVE METABOLIC PANEL     Status: Abnormal   Collection Time    05/12/13  2:37 AM      Result Value Range   Sodium 142  135 - 145 mEq/L   Potassium 4.1  3.5 -  5.1 mEq/L   Chloride 114 (*) 96 - 112 mEq/L   CO2 22  19 - 32 mEq/L   Glucose, Bld 119 (*) 70 - 99 mg/dL   BUN 26 (*) 6 - 23 mg/dL   Creatinine, Ser 0.86  0.50 - 1.35 mg/dL   Calcium 7.3 (*) 8.4 - 10.5 mg/dL   Total Protein 3.7 (*) 6.0 - 8.3 g/dL   Albumin 1.7 (*) 3.5 - 5.2 g/dL   AST 19  0 - 37 U/L   ALT 14  0 - 53 U/L   Alkaline Phosphatase 39  39 - 117 U/L   Total Bilirubin 0.4  0.3 - 1.2 mg/dL   GFR calc non Af Amer 77 (*) >90 mL/min   GFR calc Af Amer 89 (*) >90 mL/min   Comment: (NOTE)     The eGFR has been calculated using the CKD EPI equation.     This calculation has not been validated in all clinical situations.     eGFR's persistently <90 mL/min signify possible Chronic Kidney     Disease.  LACTIC ACID, PLASMA     Status: None   Collection Time    05/12/13  2:37 AM      Result Value Range   Lactic Acid, Venous 1.4  0.5 - 2.2 mmol/L  CALCIUM, IONIZED     Status: None   Collection Time    05/12/13  2:37 AM      Result Value Range   Calcium, Ion 1.20  1.12 - 1.23 mmol/L   Comment: Performed at [facility]  TROPONIN I     Status: None   Collection Time    05/12/13  2:37 AM      Result Value Range   Troponin I <0.30  <0.30 ng/mL   Comment:            Due to the release kinetics of cTnI,     a negative result within the first hours     of the onset of symptoms does not rule out     myocardial infarction with certainty.     If myocardial infarction is still suspected,     repeat the test at appropriate intervals.  HEMOGLOBIN AND HEMATOCRIT, BLOOD     Status: Abnormal   Collection Time    05/12/13  9:00 AM      Result Value Range   Hemoglobin 6.6 (*) 13.0 - 17.0 g/dL   Comment: REPEATED TO VERIFY     CRITICAL RESULT CALLED TO, READ BACK BY AND VERIFIED WITH:     D.FIELDS,RN [id number] 05/12/13 M.CAMPBELL   HCT 19.3 (*) 39.0 - 52.0 %  PREPARE RBC (CROSSMATCH)     Status: None   Collection Time  05/12/13  9:32 AM      Result Value Range   Order  Confirmation ORDER PROCESSED BY BLOOD BANK    HEMOGLOBIN AND HEMATOCRIT, BLOOD     Status: Abnormal   Collection Time    05/13/13  2:50 AM      Result Value Range   Hemoglobin 8.6 (*) 13.0 - 17.0 g/dL   Comment: DELTA CHECK NOTED     POST TRANSFUSION SPECIMEN   HCT 24.9 (*) 39.0 - 52.0 %  BASIC METABOLIC PANEL     Status: Abnormal   Collection Time    05/13/13  2:50 AM      Result Value Range   Sodium 142  135 - 145 mEq/L   Potassium 3.5  3.5 - 5.1 mEq/L   Chloride 111  96 - 112 mEq/L   CO2 25  19 - 32 mEq/L   Glucose, Bld 89  70 - 99 mg/dL   BUN 20  6 - 23 mg/dL   Creatinine, Ser 3.08  0.50 - 1.35 mg/dL   Calcium 7.4 (*) 8.4 - 10.5 mg/dL   GFR calc non Af Amer 61 (*) >90 mL/min   GFR calc Af Amer 71 (*) >90 mL/min   Comment: (NOTE)     The eGFR has been calculated using the CKD EPI equation.     This calculation has not been validated in all clinical situations.     eGFR's persistently <90 mL/min signify possible Chronic Kidney     Disease.  GLUCOSE, CAPILLARY     Status: None   Collection Time    05/13/13 12:52 PM      Result Value Range   Glucose-Capillary 91  70 - 99 mg/dL   Comment 1 Documented in Chart     Comment 2 Notify RN      No results found.  Positive for aggressive behavior, anorexia, anxiety, bad mood, behavior problems, depression, excessive alcohol consumption, mood swings and sleep disturbance Blood pressure 118/71, pulse 72, temperature 98.4 F (36.9 C), temperature source Oral, resp. rate 18, height 5\' 9"  (1.753 m), weight 78.9 kg (173 lb 15.1 oz), SpO2 100.00%.   Assessment/Plan: Patient does not meet criteria for Capacity to make his own medical decisions as he does not know about his current medical problems and what the needed treatment. Continue alcohol withdrawal protocol; may loosen his upper arm restrained as he is some what cooperative Refer to social services regarding obtaining MCPOA and guardianship Patient may be needed SNF placement as  he can not care for himself  Appreciate psych consult and may contact [phone] for further assistance  Treven Holtman,JANARDHAHA R. 05/13/2013, 4:19 PM

## 2013-05-13 NOTE — Progress Notes (Signed)
Patient extremely aggitated at this time, pulling off all leads, gown and IV's. Stopped IV meds due to pt pulling out IV. MD aware.PRN dose of IV ativan was given.Will continue to monitor.

## 2013-05-13 NOTE — Progress Notes (Signed)
[facility] Progress Note  Subjective: The patient passed blood in the stool yesterday evening. No blood passed a day and no vomiting today. He refused to have his blood drawn this morning.  Objective: Vital signs in last 24 hours: Temp:  [98.1 F (36.7 C)-98.8 F (37.1 C)] 98.4 F (36.9 C) (10/13 0800) Pulse Rate:  [66-98] 72 (10/13 0800) Resp:  [13-18] 18 (10/13 0352) BP: (106-142)/(54-81) 121/63 mmHg (10/13 0800) SpO2:  [97 %-100 %] 100 % (10/13 0800) Weight change:    PE:  He does not appear to be in any distress at this time  Abdomen is soft  Lab Results: Results for orders placed during the hospital encounter of 05/10/13 (from the past 24 hour(s))  HEMOGLOBIN AND HEMATOCRIT, BLOOD     Status: Abnormal   Collection Time    05/13/13  2:50 AM      Result Value Range   Hemoglobin 8.6 (*) 13.0 - 17.0 g/dL   HCT 16.1 (*) 09.6 - 04.5 %  BASIC METABOLIC PANEL     Status: Abnormal   Collection Time    05/13/13  2:50 AM      Result Value Range   Sodium 142  135 - 145 mEq/L   Potassium 3.5  3.5 - 5.1 mEq/L   Chloride 111  96 - 112 mEq/L   CO2 25  19 - 32 mEq/L   Glucose, Bld 89  70 - 99 mg/dL   BUN 20  6 - 23 mg/dL   Creatinine, Ser 4.09  0.50 - 1.35 mg/dL   Calcium 7.4 (*) 8.4 - 10.5 mg/dL   GFR calc non Af Amer 61 (*) >90 mL/min   GFR calc Af Amer 71 (*) >90 mL/min    Studies/Results: @[id number] @    Assessment: Duodenal ulcer. With GI bleed from visible vessel. Patient is essentially refusing appropriate medical advice.  Plan: Continue supportive care    Shahin Knierim F 05/13/2013, 9:48 AM

## 2013-05-13 NOTE — Progress Notes (Signed)
Utilization review completed.  

## 2013-05-14 LAB — CBC
HCT: 25.3 % — ABNORMAL LOW (ref 39.0–52.0)
Platelets: 246 10*3/uL (ref 150–400)
RBC: 3.05 MIL/uL — ABNORMAL LOW (ref 4.22–5.81)
RDW: 15.7 % — ABNORMAL HIGH (ref 11.5–15.5)
WBC: 7.5 10*3/uL (ref 4.0–10.5)

## 2013-05-14 LAB — PROTIME-INR: Prothrombin Time: 13.4 seconds (ref 11.6–15.2)

## 2013-05-14 LAB — GLUCOSE, CAPILLARY: Glucose-Capillary: 95 mg/dL (ref 70–99)

## 2013-05-14 LAB — BASIC METABOLIC PANEL
BUN: 17 mg/dL (ref 6–23)
Chloride: 110 mEq/L (ref 96–112)
GFR calc Af Amer: 69 mL/min — ABNORMAL LOW (ref >90)
Potassium: 3.6 mEq/L (ref 3.5–5.1)

## 2013-05-14 NOTE — Clinical Social Work Note (Signed)
CSW attempted to call pt's brother, Nicholus Chandran [phone]) (listed on Facesheet) but received no answer. CSW contacted Esther Hardy [phone]) who stated that she is the pt's caregiver. CSW asked if pt had any family that she was aware of, and Marcelino Duster stated that she was the pt's caregiver and has spoken to a RN regarding signing "paperwork for surgery." Marcelino Duster noted that she was "supposed to come yesterday," but will be coming to [facility] today (05/14/2013) to sign consent forms for pt to receive surgery. CSW to inform medical team regarding information above.  Darlyn Chamber, LCSWA 6N/3S Clinical Social Worker [phone]

## 2013-05-14 NOTE — Progress Notes (Signed)
  Palliative Care will sign off at this time.  If we can be of assistance when guardianship is secured, please re consult.  Lorinda Creed NP  Palliative Medicine Team Team Phone # [phone] Pager [phone]

## 2013-05-14 NOTE — Progress Notes (Signed)
There have been no reported signs of active bleeding overnight. Hemoglobin and hematocrit have remained stable overnight. Psychiatry has seen the patient and has deemed him incompetent to make medical decisions. It appears that consideration is being given to getting a medical power of attorney. If further bleeding occurs he will need surgery, if consented by the medical power of attorney.

## 2013-05-14 NOTE — Progress Notes (Signed)
[facility] Progress Note [place] TEAM 1 - Stepdown/ICU TEAM   Vibhav Waddill AVW:[id number] DOB: Jul 16, 1961 DOA: 05/10/2013 PCP: [name], MD  Admit HPI / Brief Narrative: 52 year old male with a history of duodenal ulcer, upper GI bleed, alcohol dependence who presented with 4-5 day history of lower extremity weakness, dizziness, and a fall. The patient had also been complaining of melanotic stools since discharge from the hospital on 05/05/2013. The patient had a maroon stool while in the emergency department. 05/01/2013, the patient underwent EGD performed by Dr. [name] who injected a visible vessel with 6cc epinephrine. On 05/03/2013, the patient underwent embolization of the gastroduodenal artery. He was discharged on 05/05/2013 with instructions to continue Carafate and Protonix. Unfortunately, the patient did not take his medications. The patient also had an admission on 04/25/2013 during which he received 6 units of PRBCs, but left AGAINST MEDICAL ADVICE on 04/29/2013. Patient also has a history of a oversewn duodenal ulcer performed on January 2014. He denied any NSAID use. He denied any alcohol or illegal drug use since his last discharge.   After admission the patient was found to be profoundly anemic with hemoglobin of 4.2 and received 5 units of blood.  He underwent EGD and received epinephrine injections at the ulcer base (endoclip could not be placed).  The patient was offered surgery but he has declined surgery at this time. Dr. Evette Cristal also discussed with IR, for possible repeat angiography with embolization as patient had embolization previously.  IR did not feel confident that it would stop the bleeding.   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 - has now been injected w/ epi x 2 and failed an attempt at selective arterial embolization - pt refuses surgery, but is also confused/beligerent -  - psych states pt does not  have the capacity to make his own decisions- I have tried contacting the brother listed in the contact but there is no answer- the is a "caretaker" who social serviced has spoken with but she should not (legally) be allow to make decisions for him- will need a state guardian if surgery is needed otherwise it will need to be done based upon implied consent - will allow GI to decide if he can start POs - will need to be committed if he tries to leave  Acute severe blood loss anemia Hgb stable for now  EtOH abuse - acute delirium Suspect EtOH withdrawal  is the cause of his severe agitation - cont ativan per CIWA protocol   Polysubstance abuse  Includes tobacco, alcohol, previous cocaine  Lack of Capacity to make decisions - have left message with social services to work on commitment papers for him and also worked on finding an emergent legal guardian.  Code Status: FULL Family Communication: no family present at time of exam-attempted to call brother but no evidence Disposition Plan: SDU   Consultants: GI Gen Surgery   Procedures: none  Antibiotics: none  DVT prophylaxis: SCD  HPI/Subjective: The is angry he states that he needs his see his family doctor for an appointment on Wednesday otherwise he will stop being paid his monthly checks. He told the nurse that his PCP appointment was on Friday and when asked again by myself states that is on Thursday. He is clearly quite confused. He has no other complaints.  Objective: Blood pressure 120/72, pulse 46, temperature 98.1 F (36.7 C), temperature source Oral, resp. rate 15, height 5\' 9"  (1.753 m), weight 78.9 kg (  173 lb 15.1 oz), SpO2 98.00%.  Intake/Output Summary (Last 24 hours) at 05/14/13 1610 Last data filed at 05/14/13 1600  Gross per 24 hour  Intake 7166.25 ml  Output   3000 ml  Net 4166.25 ml   Exam:  General: Middle-aged male laying in bed, quite agitated Lungs: There to auscultation bilaterally when listened to  anteriorly Cardiovascular: Regular rate and rhythm without murmur gallop or rub normal S1 and S2   Abdomen: Nontender, nondistended, soft, bowel sounds positive, no rebound, no ascites, no appreciable mass  Extremities: No significant cyanosis, clubbing, or edema bilateral lower extremities   Data Reviewed: Basic Metabolic Panel:  Recent Labs Lab 05/10/13 2121 05/11/13 0900 05/12/13 0237 05/13/13 0250 05/14/13 0535  NA 141 143 142 142 145  K 4.0 4.0 4.1 3.5 3.6  CL 110 113* 114* 111 110  CO2 27 25 22 25 29   GLUCOSE 104* 109* 119* 89 116*  BUN 26* 32* 26* 20 17  CREATININE 1.17 1.09 1.08 1.31 1.34  CALCIUM 7.5* 7.2* 7.3* 7.4* 7.8*   Liver Function Tests:  Recent Labs Lab 05/10/13 2020 05/12/13 0237  AST 17 19  ALT 13 14  ALKPHOS 58 39  BILITOT 0.1* 0.4  PROT 4.7* 3.7*  ALBUMIN 2.1* 1.7*    Recent Labs Lab 05/10/13 2020  LIPASE 34   CBC:  Recent Labs Lab 05/10/13 2121  05/12/13 0237 05/12/13 0900 05/13/13 0250 05/13/13 1922 05/14/13 0535  WBC 10.7*  --   --   --   --  9.3 7.5  NEUTROABS 8.5*  --   --   --   --   --   --   HGB 4.2*  < > 7.2* 6.6* 8.6* 8.7* 8.7*  HCT 13.1*  < > 20.8* 19.3* 24.9* 25.3* 25.3*  MCV 79.4  --   --   --   --  83.0 83.0  PLT 174  --   --   --   --  229 246  < > = values in this interval not displayed. Cardiac Enzymes:  Recent Labs Lab 05/12/13 0237  TROPONINI <0.30   CBG:  Recent Labs Lab 05/13/13 1252 05/13/13 1520 05/13/13 2116  GLUCAP 91 81 95   Studies:  Recent x-ray studies have been reviewed in detail by the Attending Physician  Scheduled Meds:  Scheduled Meds: . folic acid  1 mg Oral Daily  . LORazepam  0-4 mg Intravenous Q12H  . multivitamin with minerals  1 tablet Oral Daily  . pantoprazole (PROTONIX) IV  40 mg Intravenous Q12H  . sodium chloride  3 mL Intravenous Q12H  . thiamine  100 mg Oral Daily   Or  . thiamine  100 mg Intravenous Daily    Time spent on care of this patient: 35  mins   Calvert Cantor, MD  [facility] Office  [phone] Pager - Text Page per Loretha Stapler as per below:  On-Call/Text Page:      [email or web address]      password [email or web address]  If 7PM-7AM, please contact night-coverage [email or web address] Password [email or web address] 05/14/2013, 4:10 PM   LOS: 4 days

## 2013-05-15 LAB — TYPE AND SCREEN
Antibody Screen: POSITIVE
DAT, IgG: NEGATIVE
Unit division: 0
Unit division: 0
Unit division: 0
Unit division: 0
Unit division: 0
Unit division: 0
Unit division: 0
Unit division: 0

## 2013-05-15 LAB — CBC
MCH: 28.8 pg (ref 26.0–34.0)
Platelets: 242 10*3/uL (ref 150–400)
RDW: 15.4 % (ref 11.5–15.5)
WBC: 7 10*3/uL (ref 4.0–10.5)

## 2013-05-15 LAB — BASIC METABOLIC PANEL
Chloride: 107 mEq/L (ref 96–112)
GFR calc Af Amer: 80 mL/min — ABNORMAL LOW (ref >90)
Potassium: 3.6 mEq/L (ref 3.5–5.1)
Sodium: 141 mEq/L (ref 135–145)

## 2013-05-15 MED ORDER — SODIUM CHLORIDE 0.9 % IV SOLN
INTRAVENOUS | Status: DC
  Administered 2013-05-15: 23:00:00 via INTRAVENOUS
  Administered 2013-05-16: 1000 mL via INTRAVENOUS

## 2013-05-15 MED ORDER — LORAZEPAM 2 MG/ML IJ SOLN: 1.0000 mg | INTRAMUSCULAR | Status: DC | PRN

## 2013-05-15 MED ORDER — LORAZEPAM 1 MG PO TABS
1.0000 mg | ORAL_TABLET | ORAL | Status: DC | PRN
  Administered 2013-05-15: 1 mg via ORAL
  Filled 2013-05-15: qty 1

## 2013-05-15 NOTE — Progress Notes (Signed)
Chaplain provided ministry of presence to the patient. Chaplain confirmed that the patient would not like to have a Healthcare POA. Patient said he "hopes to be released on soon."   05/15/13 1700  Clinical Encounter Type  Visited With Patient;Health care provider  Visit Type Initial;Social support  Referral From Physician  Spiritual Encounters  Spiritual Needs Emotional  Stress Factors  Patient Stress Factors None identified

## 2013-05-15 NOTE — Evaluation (Signed)
Physical Therapy Evaluation Patient Details Name: [name] MRN: [id number] DOB: 1961-01-02 Today's Date: 05/15/2013 Time: 7829-5621 PT Time Calculation (min): 21 min  PT Assessment / Plan / Recommendation History of Present Illness  52 year old male with a history of duodenal ulcer, upper GI bleed, alcohol dependence presents with 4-5 day history of lower extremity weakness, dizziness, and a fall due to LE weakness. The patient has also been complaining of melanotic stools since discharge from the hospital on 05/05/2013.   Clinical Impression  Pt functioning near baseline however demo's balance impairment and increased falls risk. Anticipate pt con't to progress to safe mod I function for safe d/c home however if patient doesn't progress pt may benefit from HHPT and use of RW for safe amb. Acute PT to con't to assess progress and d/c planning.    PT Assessment  Patient needs continued PT services    Follow Up Recommendations  No PT follow up;Supervision - Intermittent    Does the patient have the potential to tolerate intense rehabilitation      Barriers to Discharge  (questionable support by roomates)      Equipment Recommendations  None recommended by PT    Recommendations for Other Services     Frequency Min 3X/week    Precautions / Restrictions Precautions Precautions: Fall Restrictions Weight Bearing Restrictions: No   Pertinent Vitals/Pain Denies pain      Mobility  Bed Mobility Bed Mobility: Supine to Sit Supine to Sit: 4: Min assist Sit to Supine: 5: Supervision Details for Bed Mobility Assistance: pt reached for assist for sup --> sit however could've done transfer without assist Transfers Transfers: Sit to Stand;Stand to Sit Sit to Stand: 4: Min guard;With upper extremity assist;From bed Stand to Sit: 4: Min guard;With upper extremity assist Details for Transfer Assistance: guarded Ambulation/Gait Ambulation/Gait Assistance: 4: Min guard Ambulation  Distance (Feet): 200 Feet Assistive device: None Ambulation/Gait Assistance Details: pt with occasional cross-over gait and lateral sway however no significant LOB. Pt denies lightheadedness/dizziness. Pt with decreased pace. pt requiring directional cues  Gait Pattern: Step-through pattern;Decreased stride length Stairs: No    Exercises     PT Diagnosis: Difficulty walking  PT Problem List: Decreased strength;Decreased activity tolerance;Decreased balance;Decreased mobility;Decreased knowledge of precautions;Decreased knowledge of use of DME PT Treatment Interventions: DME instruction;Gait training;Stair training;Therapeutic activities;Therapeutic exercise;Patient/family education;Functional mobility training     PT Goals(Current goals can be found in the care plan section) Acute Rehab PT Goals Patient Stated Goal: home PT Goal Formulation: With patient Time For Goal Achievement: 05/22/13 Potential to Achieve Goals: Good Additional Goals Additional Goal #1: pt to achieve >19/24 on DGI to indicate decreased falls risk  Visit Information  Last PT Received On: 05/15/13 Assistance Needed: +1 History of Present Illness: 52 year old male with a history of duodenal ulcer, upper GI bleed, alcohol dependence presents with 4-5 day history of lower extremity weakness, dizziness, and a fall due to LE weakness. The patient has also been complaining of melanotic stools since discharge from the hospital on 05/05/2013.        Prior Functioning  Home Living Family/patient expects to be discharged to:: Group home (1 level, level entry) Living Arrangements:  (3 additional people) Additional Comments: pt reports he lives with 3 other people Prior Function Level of Independence: Independent Comments: doesn't drive Communication Communication: No difficulties Dominant Hand: Right    Cognition  Cognition Arousal/Alertness: Awake/alert Behavior During Therapy: WFL for tasks  assessed/performed Overall Cognitive Status: Within Functional Limits for tasks assessed (pt  with delayed processing, may be baseline)    Extremity/Trunk Assessment Upper Extremity Assessment Upper Extremity Assessment: Overall WFL for tasks assessed Lower Extremity Assessment Lower Extremity Assessment: Overall WFL for tasks assessed Cervical / Trunk Assessment Cervical / Trunk Assessment: Normal   Balance    End of Session PT - End of Session Equipment Utilized During Treatment: Gait belt Activity Tolerance: Patient tolerated treatment well Patient left: in chair;with call bell/phone within reach Nurse Communication: Mobility status  GP     Marcene Brawn 05/15/2013, 12:11 PM  Lewis Shock, PT, DPT Pager #: [phone] Office #: [phone]

## 2013-05-15 NOTE — Progress Notes (Signed)
Clinical Social Work Department BRIEF PSYCHOSOCIAL ASSESSMENT 05/15/2013  Patient:  [name], [name]     Account Number:  [id number]     Admit date:  05/10/2013  Clinical Social Worker:  Harless Nakayama  Date/Time:  05/15/2013 11:00 AM  Referred by:  Physician  Date Referred:  05/15/2013 Referred for  Other - See comment   Other Referral:   Interview type:  Patient Other interview type:    PSYCHOSOCIAL DATA Living Status:  FRIEND(S) Admitted from facility:   Level of care:   Primary support name:  Dagoberto Nealy [phone] Primary support relationship to patient:  SIBLING Degree of support available:   Pt reports having good amount of support from family and friends    CURRENT CONCERNS Current Concerns  Other - See comment   Other Concerns:   Need to establish emergency contact and clarify pt living situation    SOCIAL WORK ASSESSMENT / PLAN CSW received report on pt and informed by [facility] that pt has a brother that hospital staff has been trying to contact and a "caregiver" has been calling and speaking with staff. CSW also informed that pt living situation is not clear. CSW aware pt was previously agitated but somewhat calm today and willing to work with staff.  CSW spoke with pt who reported he lives at [address] in [place] with friends he has known for years, Gaye Alken Sr., Lytle Michaels., and Elmarie Shiley. Pt reports he plans to return to this address at dc. Pt provided phone numbers for Zella Richer. [phone] and Benjamine Mola. [phone]. When CSW asked who should be listed as pt emergency contact pt reported his brother [name] and gave phone number [phone]. Pt reported that his brother called him yesterday to check in on him. CSW to continue following incase new needs arise. CSW will attempt to contact pt family/friends to confirm information pt provided when dc plan is more clear.   Assessment/plan status:  Psychosocial Support/Ongoing Assessment of Needs Other assessment/  plan:   Information/referral to community resources:   None needed at this time    PATIENT'S/FAMILY'S RESPONSE TO PLAN OF CARE: Pt was very pleasant with CSW and provided information as requested. Pt wants to dc back to home where he was staying.       Love Chowning, LCSWA [phone]

## 2013-05-15 NOTE — Progress Notes (Signed)
[facility] Progress Note [place] TEAM 1 - Stepdown/ICU TEAM   Quayshaun Hubbert ZOX:[id number] DOB: 1961-07-15 DOA: 05/10/2013 PCP: [name], MD  Admit HPI / Brief Narrative: 52 year old male with a history of duodenal ulcer, upper GI bleed, alcohol dependence who presented with 4-5 day history of lower extremity weakness, dizziness, and a fall. The patient had also been complaining of melanotic stools since discharge from the hospital on 05/05/2013. The patient had a maroon stool while in the emergency department. 05/01/2013, the patient underwent EGD performed by Dr. [name] who injected a visible vessel with 6cc epinephrine. On 05/03/2013, the patient underwent embolization of the gastroduodenal artery. He was discharged on 05/05/2013 with instructions to continue Carafate and Protonix. Unfortunately, the patient did not take his medications. The patient also had an admission on 04/25/2013 during which he received 6 units of PRBCs, but left AGAINST MEDICAL ADVICE on 04/29/2013. Patient also has a history of a oversewn duodenal ulcer performed on January 2014. He denied any NSAID use. He denied any alcohol or illegal drug use since his last discharge.   After admission the patient was found to be profoundly anemic with hemoglobin of 4.2 and received 5 units of blood.  He underwent EGD and received epinephrine injections at the ulcer base (endoclip could not be placed).  The patient was offered surgery but he has declined surgery at this time. Dr. Evette Cristal also discussed with IR, for possible repeat angiography with embolization as patient had embolization previously.  IR did not feel confident that it would stop the bleeding.   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 Butler Denmark tried contacting the brother listed in the contact but there was no answer - today the patient's mental status has improved markedly - it is possible he was suffering with alcohol withdrawal that is now clearing - at present there is no indication for an acute procedure or intervention regarding his prior bleeding - should he begin to bleed as his diet is advanced we will need to ask psychiatry to revisit him to determine if he is still in competent or not - if he is deemed incompetent either his brother or a state appointed guardian will need to assume power of attorney  Acute severe blood loss anemia Hgb stable for now - cont to follow in a serial fashion   EtOH abuse - acute delirium Suspect EtOH withdrawal  was the cause of his severe agitation - cont ativan per CIWA protocol - pt is much improved today   Polysubstance abuse  Includes tobacco, alcohol, previous cocaine  Lack of Capacity to make decisions Dr. Butler Denmark asked social services to work on commitment papers for him and also worked on finding an emergent legal guardian - please see discussion above - for now, if pt attempts to leave AMA he will need to be prevented from doing so and involuntarily committed until Psych can re-evaluate his mental state   Code Status: FULL Family Communication: no family present at time of exam Disposition Plan: stable for transfer to medical bed - SLOWLY advancing diet - ultimate d/c dispo dependent upon mental capacity at time of medical readiness for d/c   Consultants: GI Gen Surgery  Psychiatry   Procedures: none  Antibiotics: none  DVT prophylaxis: SCDs  HPI/Subjective: The patient is much more alert, pleasant, and cooperative today.  His only complaint is being limited to clear liquid intake.  He denies abdominal pain chest pain fevers chills melena or hematochezia.  Objective: Blood pressure 132/80, pulse 60,  temperature 98.4 F (36.9 C), temperature source Oral, resp. rate 14, height 5\' 9"  (1.753 m), weight 78.9 kg (173 lb 15.1 oz), SpO2 99.00%.  Intake/Output Summary (Last 24 hours) at 05/15/13 1503 Last data filed at 05/15/13 1300  Gross per 24 hour  Intake 5262.5 ml  Output   4825 ml  Net  437.5 ml   Exam: General:  No acute distress - calm and cooperative Lungs: Clear to auscultation throughout all fields with no wheeze Cardiovascular: Regular rate and rhythm without murmur gallop or rub normal S1 and S2   Abdomen: Nontender, nondistended, soft, bowel sounds positive, no rebound, no ascites, no appreciable mass  Extremities: No significant cyanosis, clubbing, or edema bilateral lower extremities   Data Reviewed: Basic Metabolic Panel:  Recent Labs Lab 05/11/13 0900 05/12/13 0237 05/13/13 0250 05/14/13 0535 05/15/13 0419  NA 143 142 142 145 141  K 4.0 4.1 3.5 3.6 3.6  CL 113* 114* 111 110 107  CO2 25 22 25 29 30   GLUCOSE 109* 119* 89 116* 104*  BUN 32* 26* 20 17 9   CREATININE 1.09 1.08 1.31 1.34 1.18  CALCIUM 7.2* 7.3* 7.4* 7.8* 7.8*   Liver Function Tests:  Recent Labs Lab 05/10/13 2020 05/12/13 0237  AST 17 19  ALT 13 14  ALKPHOS 58 39  BILITOT 0.1* 0.4  PROT 4.7* 3.7*  ALBUMIN 2.1* 1.7*    Recent Labs Lab 05/10/13 2020  LIPASE 34   CBC:  Recent Labs Lab 05/10/13 2121  05/12/13 0900 05/13/13 0250 05/13/13 1922 05/14/13 0535 05/15/13 0419  WBC 10.7*  --   --   --  9.3 7.5 7.0  NEUTROABS 8.5*  --   --   --   --   --   --   HGB 4.2*  < > 6.6* 8.6* 8.7* 8.7* 9.0*  HCT 13.1*  < > 19.3* 24.9* 25.3* 25.3* 25.7*  MCV 79.4  --   --   --  83.0 83.0 82.1  PLT 174  --   --   --  229 246 242  < > = values in this interval not displayed. Cardiac Enzymes:  Recent Labs Lab 05/12/13 0237  TROPONINI <0.30   CBG:  Recent Labs Lab 05/13/13 1252 05/13/13 1520 05/13/13 2116  GLUCAP 91 81 95   Studies:  Recent x-ray studies have been reviewed in  detail by the Attending Physician  Scheduled Meds:  Scheduled Meds: . folic acid  1 mg Oral Daily  . multivitamin with minerals  1 tablet Oral Daily  . pantoprazole (PROTONIX) IV  40 mg Intravenous Q12H  . sodium chloride  3 mL Intravenous Q12H  . thiamine  100 mg Oral Daily   Or  . thiamine  100 mg Intravenous Daily    Time spent on care of this patient: 35 mins   MCCLUNG,JEFFREY T, MD  [facility] Office  [phone] Pager - Text Page per Loretha Stapler as per below:  On-Call/Text Page:      [email or web address]      password [email or web address]  If 7PM-7AM, please contact night-coverage [email or web address] Password [email or web address] 05/15/2013, 3:03 PM   LOS: 5 days

## 2013-05-15 NOTE — Progress Notes (Addendum)
Patient was transferred from 3S. Oriented to room. Bed alarm set.

## 2013-05-15 NOTE — Progress Notes (Signed)
Pt transferred to 5W18 per MD order. Report called to receiving nurse and all questions answered.

## 2013-05-15 NOTE — Progress Notes (Signed)
[facility] Progress Note  Subjective: Patient said he feels well. No report of bleeding. Asked if he could have his diet advanced some.   Objective: Vital signs in last 24 hours: Temp:  [98 F (36.7 C)-98.4 F (36.9 C)] 98 F (36.7 C) (10/15 0724) Pulse Rate:  [46-75] 51 (10/15 0724) Resp:  [13-19] 14 (10/15 0724) BP: (102-151)/(66-88) 122/71 mmHg (10/15 0724) SpO2:  [92 %-98 %] 96 % (10/15 0724) Weight change:    PE:  No distress   Lab Results: Results for orders placed during the hospital encounter of 05/10/13 (from the past 24 hour(s))  CBC     Status: Abnormal   Collection Time    05/15/13  4:19 AM      Result Value Range   WBC 7.0  4.0 - 10.5 K/uL   RBC 3.13 (*) 4.22 - 5.81 MIL/uL   Hemoglobin 9.0 (*) 13.0 - 17.0 g/dL   HCT 40.9 (*) 81.1 - 91.4 %   MCV 82.1  78.0 - 100.0 fL   MCH 28.8  26.0 - 34.0 pg   MCHC 35.0  30.0 - 36.0 g/dL   RDW 78.2  95.6 - 21.3 %   Platelets 242  150 - 400 K/uL  BASIC METABOLIC PANEL     Status: Abnormal   Collection Time    05/15/13  4:19 AM      Result Value Range   Sodium 141  135 - 145 mEq/L   Potassium 3.6  3.5 - 5.1 mEq/L   Chloride 107  96 - 112 mEq/L   CO2 30  19 - 32 mEq/L   Glucose, Bld 104 (*) 70 - 99 mg/dL   BUN 9  6 - 23 mg/dL   Creatinine, Ser 0.86  0.50 - 1.35 mg/dL   Calcium 7.8 (*) 8.4 - 10.5 mg/dL   GFR calc non Af Amer 69 (*) >90 mL/min   GFR calc Af Amer 80 (*) >90 mL/min    Studies/Results: @[id number] @    Assessment: GI bleed, stable with H and H stable.  Plan: Advance to full liquid diet today.    Garlin Batdorf F 05/15/2013, 10:23 AM

## 2013-05-16 LAB — CBC
HCT: 26.9 % — ABNORMAL LOW (ref 39.0–52.0)
Hemoglobin: 9.2 g/dL — ABNORMAL LOW (ref 13.0–17.0)
MCH: 28.1 pg (ref 26.0–34.0)
MCHC: 34.2 g/dL (ref 30.0–36.0)
MCV: 82.3 fL (ref 78.0–100.0)
Platelets: 244 10*3/uL (ref 150–400)
RDW: 14.9 % (ref 11.5–15.5)

## 2013-05-16 MED ORDER — ADULT MULTIVITAMIN W/MINERALS CH: 1.0000 | ORAL_TABLET | Freq: Every day | ORAL | Status: DC

## 2013-05-16 MED ORDER — FOLIC ACID 1 MG PO TABS: 1.0000 mg | ORAL_TABLET | Freq: Every day | ORAL | Status: DC

## 2013-05-16 MED ORDER — THIAMINE HCL 100 MG PO TABS: 100.0000 mg | ORAL_TABLET | Freq: Every day | ORAL | Status: DC

## 2013-05-16 NOTE — Progress Notes (Signed)
[facility] Progress Note [place] TEAM 1 - Stepdown/ICU TEAM   [name] UEA:[id number] DOB: 1960/10/06 DOA: 05/10/2013 PCP: Ryan Plum, MD  Admit HPI / Brief Narrative: 52 year old male with a history of duodenal ulcer, upper GI bleed, alcohol dependence who presented with 4-5 day history of lower extremity weakness, dizziness, and a fall. The patient had also been complaining of melanotic stools since discharge from the hospital on 05/05/2013. The patient had a maroon stool while in the emergency department. 05/01/2013, the patient underwent EGD performed by Dr. Loreta Frazier who injected a visible vessel with 6cc epinephrine. On 05/03/2013, the patient underwent embolization of the gastroduodenal artery. He was discharged on 05/05/2013 with instructions to continue Carafate and Protonix. Unfortunately, the patient did not take his medications. The patient also had an admission on 04/25/2013 during which he received 6 units of PRBCs, but left AGAINST MEDICAL ADVICE on 04/29/2013. Patient also has a history of a oversewn duodenal ulcer performed on January 2014. He denied any NSAID use. He denied any alcohol or illegal drug use since his last discharge.   After admission the patient was found to be profoundly anemic with hemoglobin of 4.2 and received 5 units of blood.  He underwent EGD and received epinephrine injections at the ulcer base (endoclip could not be placed).  The patient was offered surgery but he has declined surgery at this time. Dr. Evette Frazier also discussed with IR, for possible repeat angiography with embolization as patient had embolization previously.  IR did not feel confident that it would stop the bleeding.   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 Butler Frazier tried contacting the brother listed in the contact but there was no answer - today the patient's mental status has improved markedly - it is possible he was suffering with alcohol withdrawal that is now clearing - at present there is no indication for an acute procedure or intervention regarding his prior bleeding  - diet advanced to regular today- I have convinced the pt to stay until the morning and he will be discharged as long as he does not re-bleed.   Acute severe blood loss anemia Hgb stable for now - cont to follow in a serial fashion   EtOH abuse - acute delirium Suspect EtOH withdrawal  was the cause of his severe agitation - cont ativan per CIWA protocol - pt is much improved today   Polysubstance abuse  Includes tobacco, alcohol, previous cocaine  Acute encephalopathy - ETOH withdrawal vs ICU delirium   Lack of Capacity to make decisions - now has capacity to make decisions  Code Status: FULL Family Communication: spoke with brother [name] Disposition Plan: stable for transfer to medical bed - SLOWLY advancing diet -   Consultants: GI Gen Surgery  Psychiatry   Procedures: none  Antibiotics: none  DVT prophylaxis: SCDs  HPI/Subjective: The patient alert and very agitated- some told him he would go home today- tell me that he is willing to stay after I explain that we would like to watch for re-bleed on a regular diet- he states he was taking Protonix and Carafate regularly as an outpt and has them with him in a bag in the closet (I have confirmed that he does  have these meds).   Objective: Blood pressure 139/72, pulse 66, temperature 98.6 F (37 C), temperature source Oral, resp. rate 20, height 5\' 9"  (1.753 m), weight 78.9 kg (173 lb 15.1 oz), SpO2 93.00%.  Intake/Output Summary (Last 24 hours) at 05/16/13 1830 Last data filed at 05/16/13 1544  Gross per 24 hour  Intake 1414.17 ml   Output   1825 ml  Net -410.83 ml   Exam: General:  No acute distress - calm and cooperative Lungs: Clear to auscultation throughout all fields with no wheeze Cardiovascular: Regular rate and rhythm without murmur gallop or rub normal S1 and S2   Abdomen: Nontender, nondistended, soft, bowel sounds positive, no rebound, no ascites, no appreciable mass  Extremities: No significant cyanosis, clubbing, or edema bilateral lower extremities   Data Reviewed: Basic Metabolic Panel:  Recent Labs Lab 05/11/13 0900 05/12/13 0237 05/13/13 0250 05/14/13 0535 05/15/13 0419  NA 143 142 142 145 141  K 4.0 4.1 3.5 3.6 3.6  CL 113* 114* 111 110 107  CO2 25 22 25 29 30   GLUCOSE 109* 119* 89 116* 104*  BUN 32* 26* 20 17 9   CREATININE 1.09 1.08 1.31 1.34 1.18  CALCIUM 7.2* 7.3* 7.4* 7.8* 7.8*   Liver Function Tests:  Recent Labs Lab 05/10/13 2020 05/12/13 0237  AST 17 19  ALT 13 14  ALKPHOS 58 39  BILITOT 0.1* 0.4  PROT 4.7* 3.7*  ALBUMIN 2.1* 1.7*    Recent Labs Lab 05/10/13 2020  LIPASE 34   CBC:  Recent Labs Lab 05/10/13 2121  05/13/13 0250 05/13/13 1922 05/14/13 0535 05/15/13 0419 05/16/13 0512  WBC 10.7*  --   --  9.3 7.5 7.0 8.3  NEUTROABS 8.5*  --   --   --   --   --   --   HGB 4.2*  < > 8.6* 8.7* 8.7* 9.0* 9.2*  HCT 13.1*  < > 24.9* 25.3* 25.3* 25.7* 26.9*  MCV 79.4  --   --  83.0 83.0 82.1 82.3  PLT 174  --   --  229 246 242 244  < > = values in this interval not displayed. Cardiac Enzymes:  Recent Labs Lab 05/12/13 0237  TROPONINI <0.30   CBG:  Recent Labs Lab 05/13/13 1252 05/13/13 1520 05/13/13 2116  GLUCAP 91 81 95   Studies:  Recent x-ray studies have been reviewed in detail by the Attending Physician  Scheduled Meds:  Scheduled Meds: . folic acid  1 mg Oral Daily  . multivitamin with minerals  1 tablet Oral Daily  . pantoprazole (PROTONIX) IV  40 mg Intravenous Q12H  . sodium chloride  3 mL Intravenous Q12H  . thiamine  100 mg  Oral Daily   Or  . thiamine  100 mg Intravenous Daily    Time spent on care of this patient: 35 mins   Ryan Cantor, MD  [facility] Office  [phone] Pager - Text Page per [name] as per below:  On-Call/Text Page:      [name]      password [email or web address]  If 7PM-7AM, please contact night-coverage [email or web address] Password [email or web address] 05/16/2013, 6:30 PM   LOS: 6 days

## 2013-05-16 NOTE — Progress Notes (Signed)
Pt. Complaining about being on a full liquid diet with mashed potatoes.  Stating the staff is not doing anything for him and that we need to call the MD to get his diet change.  Pt. Also wants to know when he is going to get out of here.  Paged Dr. Butler Denmark who is on team 1 today, as well as the GI MD which Dr. Randa Evens is on call.  Will continue to monitor and await return calls for MD's.  Forbes Cellar, RN

## 2013-05-16 NOTE — Progress Notes (Signed)
Physical Therapy Treatment/Discharge Note Patient Details Name: [name] MRN: [id number] DOB: [birth year] Today's Date: 05/16/2013 Time: 6962-9528 PT Time Calculation (min): 10 min  PT Assessment / Plan / Recommendation  History of Present Illness 52 year old male with a history of duodenal ulcer, upper GI bleed, alcohol dependence presents with 4-5 day history of lower extremity weakness, dizziness, and a fall due to LE weakness. The patient has also been complaining of melanotic stools since discharge from the hospital on 05/05/2013.    PT Comments   The pt is moving very well today, steady on his feet, no signs of balance deficits.  Pt discharged from PT- no acute or f/u needs at this time.    Follow Up Recommendations  No PT follow up     Does the patient have the potential to tolerate intense rehabilitation    NA  Barriers to Discharge   None      Equipment Recommendations  None recommended by PT    Recommendations for Other Services   None  Frequency   NA- d/c therapy  Progress towards PT Goals Progress towards PT goals: Goals met/education completed, patient discharged from PT  Plan Current plan remains appropriate;Other (comment) (PT to sign off.  No further services needed)    Precautions / Restrictions   none  Pertinent Vitals/Pain See vitals flow sheet.     Mobility  Bed Mobility Bed Mobility: Supine to Sit;Sitting - Scoot to Edge of Bed;Sit to Supine Supine to Sit: 7: Independent Sitting - Scoot to Edge of Bed: 7: Independent Sit to Supine: 7: Independent Transfers Sit to Stand: 7: Independent Stand to Sit: 7: Independent Ambulation/Gait Ambulation/Gait Assistance: 7: Independent Ambulation Distance (Feet): 600 Feet Assistive device: None Ambulation/Gait Assistance Details: Independent, no LOB, no abnormal gait pattern Gait Pattern: Within Functional Limits Gait velocity: WNL Stairs: Yes Stairs Assistance: 6: Modified independent (Device/Increase  time) Stair Management Technique: One rail Right;Alternating pattern;Forwards Number of Stairs: 4 (limited by IV line)      PT Goals (current goals can now be found in the care plan section) Acute Rehab PT Goals Patient Stated Goal: Home  Visit Information  Last PT Received On: 05/16/13 Assistance Needed: +1 History of Present Illness: 52 year old male with a history of duodenal ulcer, upper GI bleed, alcohol dependence presents with 4-5 day history of lower extremity weakness, dizziness, and a fall due to LE weakness. The patient has also been complaining of melanotic stools since discharge from the hospital on 05/05/2013.     Subjective Data  Subjective: Pt reports he feels much stronger on his feet today compared to yesterday.   Patient Stated Goal: Home   Cognition  Cognition Arousal/Alertness: Awake/alert Behavior During Therapy: WFL for tasks assessed/performed Overall Cognitive Status: Within Functional Limits for tasks assessed    Balance  Standardized Balance Assessment Standardized Balance Assessment: Berg Balance Test;Dynamic Gait Index Berg Balance Test Sit to Stand: Able to stand without using hands and stabilize independently Standing Unsupported: Able to stand safely 2 minutes Sitting with Back Unsupported but Feet Supported on Floor or Stool: Able to sit safely and securely 2 minutes Stand to Sit: Sits safely with minimal use of hands Transfers: Able to transfer safely, minor use of hands Standing Unsupported with Eyes Closed: Able to stand 10 seconds safely Standing Ubsupported with Feet Together: Able to place feet together independently and stand 1 minute safely From Standing, Reach Forward with Outstretched Arm: Can reach confidently >25 cm (10") From Standing Position, Pick up Object from  Floor: Able to pick up shoe safely and easily From Standing Position, Turn to Look Behind Over each Shoulder: Looks behind from both sides and weight shifts well Turn 360  Degrees: Able to turn 360 degrees safely in 4 seconds or less Standing Unsupported, Alternately Place Feet on Step/Stool: Able to stand independently and safely and complete 8 steps in 20 seconds Standing Unsupported, One Foot in Front: Able to place foot tandem independently and hold 30 seconds Standing on One Leg: Able to lift leg independently and hold > 10 seconds Total Score: 56 Dynamic Gait Index Level Surface: Normal Change in Gait Speed: Normal Gait with Horizontal Head Turns: Normal Gait with Vertical Head Turns: Normal Gait and Pivot Turn: Normal Step Over Obstacle: Normal Step Around Obstacles: Normal Steps: Normal Total Score: 24  End of Session PT - End of Session Activity Tolerance: Patient tolerated treatment well Patient left: in bed;with call bell/phone within reach;Other (comment) (seated EOB) Nurse Communication: Mobility status;Other (comment) (PT signing off)    Lurena Joiner B. Mignon Bechler, PT, DPT [phone]   05/16/2013, 4:37 PM

## 2013-05-16 NOTE — Progress Notes (Signed)
[facility] Progress Note  Subjective: No signs of any further GI bleeding. Patient has been transferred to the medical floor. Diet was advanced and he is tolerating it well.  Objective: Vital signs in last 24 hours: Temp:  [98.1 F (36.7 C)-98.4 F (36.9 C)] 98.4 F (36.9 C) (10/16 0520) Pulse Rate:  [60-72] 67 (10/16 0520) Resp:  [14-25] 18 (10/16 0520) BP: (117-156)/(67-97) 129/97 mmHg (10/16 0520) SpO2:  [97 %-100 %] 98 % (10/16 0520) Weight change:    PE:  He is in no distress  Abdomen soft nontender  Lab Results: Results for orders placed during the hospital encounter of 05/10/13 (from the past 24 hour(s))  CBC     Status: Abnormal   Collection Time    05/16/13  5:12 AM      Result Value Range   WBC 8.3  4.0 - 10.5 K/uL   RBC 3.27 (*) 4.22 - 5.81 MIL/uL   Hemoglobin 9.2 (*) 13.0 - 17.0 g/dL   HCT 78.2 (*) 95.6 - 21.3 %   MCV 82.3  78.0 - 100.0 fL   MCH 28.1  26.0 - 34.0 pg   MCHC 34.2  30.0 - 36.0 g/dL   RDW 08.6  57.8 - 46.9 %   Platelets 244  150 - 400 K/uL    Studies/Results: @RISRSLT24 @    Assessment: Duodenal ulcer with visible vessel causing GI bleed. Patient has been stable for the last few days.  Plan: Continue medical management. Observe for further bleeding.    Moriah Loughry F 05/16/2013, 9:40 AM

## 2013-05-16 NOTE — Clinical Social Work Note (Signed)
CSW has contacted the person the patient lives with Marcelino Duster [phone]). Marcelino Duster has confirmed that patient can come back to her home. CSW also contacted patient's brother. Brother states that patient does not live him and that he will not be able to provide transportation upon discharge. CSW informed Marcelino Duster that patient will not DC today, but will DC tomorrow. Marcelino Duster states that she will provide transportation for patient if she is available when he is discharged from the hospital. If Marcelino Duster cannot provide transportation, patient states that he is able to ride the bus to where he lives. CSW spoke with MD who stated that patient has capacity and plans for DC on 05/17/13. CSW will follow up with patient 05/17/13, to determine needs for transportation (if any).   Roddie Mc, [place], [place], [phone]

## 2013-05-16 NOTE — Evaluation (Addendum)
Occupational Therapy Evaluation Patient Details Name: [name] MRN: [id number] DOB: Dec 07, 1960 Today's Date: 05/16/2013 Time: 0865-7846 OT Time Calculation (min): 21 min  OT Assessment / Plan / Recommendation History of present illness 52 year old male with a history of duodenal ulcer, upper GI bleed, alcohol dependence presents with 4-5 day history of lower extremity weakness, dizziness, and a fall due to LE weakness. The patient has also been complaining of melanotic stools since discharge from the hospital on 05/05/2013.    Clinical Impression   Pt participated in ADL's seated at EOB for LB dressing and standing at sink for grooming and UB/LB bathing. Pt appears to be at baseline level of independence. Currently Mod I - intermittent supervision secondary to IV/lines. Will sign off acute OT at this time.   OT Assessment  Patient does not need any further OT services    Follow Up Recommendations  Supervision - Intermittent    Barriers to Discharge      Equipment Recommendations  None recommended by OT    Recommendations for Other Services    Frequency       Precautions / Restrictions Precautions Precautions: Fall   Pertinent Vitals/Pain No, denies pain    ADL  Grooming: Performed;Wash/dry hands;Wash/dry face;Teeth care;Supervision/safety (Secondary to IV/Lines, otherwise Mod I) Where Assessed - Grooming: Unsupported standing Upper Body Bathing: Performed;Right arm;Left arm;Chest;Supervision/safety Where Assessed - Upper Body Bathing: Supported standing;Unsupported standing (Standing at sink) Lower Body Bathing: Performed;Supervision/safety Where Assessed - Lower Body Bathing: Unsupported standing Upper Body Dressing: Modified independent;Simulated Where Assessed - Upper Body Dressing: Unsupported standing;Unsupported sitting Lower Body Dressing: Performed;Independent Where Assessed - Lower Body Dressing: Unsupported sitting Toilet Transfer: Performed;Supervision/safety  (secondary to IV/Lines) Toilet Transfer Method: Sit to stand Toilet Transfer Equipment: Comfort height toilet Toileting - Clothing Manipulation and Hygiene: Performed;Supervision/safety Where Assessed - Toileting Clothing Manipulation and Hygiene: Sit to stand from 3-in-1 or toilet;Standing Tub/Shower Transfer Method: Not assessed Transfers/Ambulation Related to ADLs: Pt ambulating in room, bathroom and hallway w/ supervision today. No LOB noted, assist w/ IV/Lines. Pt did not use assistive device while ambulating. ADL Comments: Pt was educated in role of OT and verbalized understanding. He participated in ADL's seated at EOB for LB dressing and standing at sink for grooming and UB/LB bathing. Pt appears to be at baseline level of independence. Pt then ambulated in hall, past RN station to end of hallway and back w/o LOB, however he moves quickly, overall Mod I - supervision. Agree w/ PT for no needs after acute d/c however recommend intermittant supervision. Pt very focused on need to get home and go to appointment tomorrow "for my benefits, I can't miss that appointment or I lose them" RN made aware of this.    OT Diagnosis:    OT Problem List:   OT Treatment Interventions:     OT Goals(Current goals can be found in the care plan section) Acute Rehab OT Goals Patient Stated Goal: I need to get home and go to my appointment tokmorrow or I could lose my benefits  Visit Information  Last OT Received On: 05/16/13 Assistance Needed: +1 History of Present Illness: 52 year old male with a history of duodenal ulcer, upper GI bleed, alcohol dependence presents with 4-5 day history of lower extremity weakness, dizziness, and a fall due to LE weakness. The patient has also been complaining of melanotic stools since discharge from the hospital on 05/05/2013.        Prior Functioning     Home Living Family/patient expects to be discharged  to:: Group home Living Arrangements:  Non-relatives/Friends Additional Comments: pt reports he lives with 3 other people Prior Function Level of Independence: Independent Comments: doesn't drive Communication Communication: No difficulties Dominant Hand: Right    Vision/Perception Vision - History Patient Visual Report: No change from baseline   Cognition  Cognition Arousal/Alertness: Awake/alert Behavior During Therapy: WFL for tasks assessed/performed Overall Cognitive Status: Within Functional Limits for tasks assessed    Extremity/Trunk Assessment Upper Extremity Assessment Upper Extremity Assessment: Overall WFL for tasks assessed Lower Extremity Assessment Lower Extremity Assessment: Overall WFL for tasks assessed Cervical / Trunk Assessment Cervical / Trunk Assessment: Normal    Mobility Bed Mobility Bed Mobility: Supine to Sit;Sitting - Scoot to [place] of Bed;Sit to Supine;Scooting to [facility] Supine to Sit: 6: Modified independent (Device/Increase time) Sitting - Scoot to Edge of Bed: 6: Modified independent (Device/Increase time) Sit to Supine: 6: Modified independent (Device/Increase time) Scooting to [facility]: 6: Modified independent (Device/Increase time) Transfers Transfers: Sit to Stand;Stand to Sit Sit to Stand: 6: Modified independent (Device/Increase time);With upper extremity assist Stand to Sit: 6: Modified independent (Device/Increase time);With upper extremity assist        Balance Balance Balance Assessed: Yes Static Sitting Balance Static Sitting - Balance Support: No upper extremity supported;Feet supported;Feet unsupported Static Standing Balance Static Standing - Balance Support: No upper extremity supported;During functional activity Static Standing - Level of Assistance: 6: Modified independent (Device/Increase time) Dynamic Standing Balance Dynamic Standing - Balance Support: Right upper extremity supported;Left upper extremity supported;During functional activity;No upper extremity  supported (Pt standing at sink for LB bathing w/ and w/o UE support on counter top)   End of Session OT - End of Session Activity Tolerance: Patient tolerated treatment well Patient left: in bed;with call bell/phone within reach;with bed alarm set Nurse Communication: Mobility status;Other (comment) (Pt request RN to call re: appointment scheduled  10/17/)  GO     Alm Bustard 05/16/2013, 8:34 AM

## 2013-05-16 NOTE — Discharge Summary (Signed)
Physician Discharge Summary  Tyvon Eggenberger AVW:[id number] DOB: 30-Sep-1960 DOA: 05/10/2013  PCP: [name], MD  Admit date: 05/10/2013 Discharge date: 05/17/2013  Time spent: >30 minutes  Recommendations for Outpatient Follow-up:   Follow-up Information   Follow up with [name], MD. Schedule an appointment as soon as possible for a visit in 4 weeks. ( )    Specialty:  Gastroenterology   Contact information:   [address], [address] [place] [address] [phone]       Follow up with OSEI-BONSU,GEORGE, MD. Schedule an appointment as soon as possible for a visit in 1 week.   Specialty:  Internal Medicine   Contact information:   [address], [address] [place] [address] [phone]      Discharge Diagnoses:    Acute Anemia due to GI blood loss   GI Bleeding due to Duodenal ulcer   Status post laparotomy with oversew of bleeding duodenal artery January 2014   Alcohol abuse   Acute encephalopathy due to acute alcohol withdrawal   Syncope due to acute blood loss induced hypovolemia / hemorrhagic shock   Discharge Condition: stable   Diet recommendation: bland, no caffeine, heart healthy - MUST AVOID ALL NSAIDS AND ALCOHOL  Disposition: Dr. Butler Denmark spoke with the patient's brother Johnaton Sonneborn who did not feel comfortable being the main decision maker as he does not live with the patient. Dr. Butler Denmark attempted to call [facility] but she did not answer the phone - she left a message. Dr. Vilinda Blanks communicated to the brother that it is of utmost importance that the patient have POA set up/  Filed Weights   05/10/13 2010 05/11/13 0214  Weight: 72.576 kg (160 lb) 78.9 kg (173 lb 15.1 oz)    History of present illness:  52 year old male with a history of duodenal ulcer, upper GI bleed, alcohol dependence who presented with 4-5 day history of lower extremity weakness, dizziness, and a fall. The patient had also been complaining of melanotic stools  since discharge from the hospital on 05/05/2013. The patient had a maroon stool while in the emergency department. 05/01/2013, the patient underwent EGD performed by Dr. [name] who injected a visible vessel with 6cc epinephrine. On 05/03/2013, the patient underwent embolization of the gastroduodenal artery. He was discharged on 05/05/2013 with instructions to continue Carafate and Protonix. Unfortunately, the patient did not take his medications. The patient also had an admission on 04/25/2013 during which he received 6 units of PRBCs, but left AGAINST MEDICAL ADVICE on 04/29/2013. Patient also has a history of a oversewn duodenal ulcer performed on January 2014. He denied any NSAID use.   Hospital Course:  After admission the patient was found to be profoundly anemic with hemoglobin of 4.2 and received multiple units of blood. He underwent EGD and received epinephrine injections at the ulcer base (endoclip could not be placed). The patient was offered surgery but he declined. Dr. Evette Cristal also discussed with IR possible repeat angiography with embolization as patient had embolization previously. IR did not feel confident that it would stop the bleeding.   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 Butler Denmark tried contacting the brother listed in the contact but there was no answer -  -  patient's mental status improved markedly w/ clearance of his DTs - it is possible he was suffering with alcohol withdrawal - at d/c there was no indication for an acute procedure or intervention regarding his prior bleeding - should he suffer bleeding again in the future will need to ask surgery to see him  Acute severe blood loss anemia  Hgb stable -  avoid NSAIDs and EtOH  EtOH abuse - acute delirium  Suspect EtOH withdrawal was the cause of his severe agitation - pt has returned to normal mental status at time of d/c and clearly has capacity at time of d/c   Polysubstance abuse  Includes tobacco, alcohol, previous cocaine - pt counseled extensively as to the absolute need to stop all substances of abuse - it was explained clearly to him that ongoing EtOH use will cause his ulcers to bleed again and would likely kill him   Lack of Capacity to make decisions Psych deemed him incompetent when he was agitated and confused in the ICU and SDU - at time of d/c he is alert and very oriented - he is now competent to make decisions - his acute delirium appears to have been due to EtOH withdrawal  Consultations: GI  Psychiatry Gen Surgery   Discharge Exam: Filed Vitals:   05/17/13 0513  BP: 144/85  Pulse: 64  Temp: 98.6 F (37 C)  Resp: 20   General: No acute respiratory distress Lungs: Clear to auscultation bilaterally without wheezes or crackles Cardiovascular: Regular rate and rhythm without murmur gallop or rub normal S1 and S2 Abdomen: Nontender, nondistended, soft, bowel sounds positive, no rebound, no ascites, no appreciable mass Extremities: No significant cyanosis, clubbing, or edema bilateral lower extremities  Discharge Instructions      Discharge Orders   Future Orders Complete By Expires   Diet - low sodium heart healthy  As directed    Discharge instructions  As directed    Comments:     Avoid caffeine, spicy food, alcohol, Ibuprofen, Aspirin and Naprosyn as they can cause your ulcer to bleed.   Increase activity slowly  As directed    Increase activity slowly  As directed        Medication List         folic acid 1 MG tablet  Commonly known as:  FOLVITE  Take 1 tablet (1 mg total) by mouth daily.     multivitamin with minerals Tabs tablet  Take 1 tablet by mouth daily.     pantoprazole 40 MG tablet   Commonly known as:  PROTONIX  Take 1 tablet (40 mg total) by mouth 2 (two) times daily.     sucralfate 1 GM/10ML suspension  Commonly known as:  CARAFATE  Take 10 mLs (1 g total) by mouth 4 (four) times daily -  with meals and at bedtime.     thiamine 100 MG tablet  Take 1 tablet (100 mg total) by mouth daily.       Allergies  Allergen Reactions  . Chocolate Nausea And Vomiting  . Peanut-Containing Drug Products Nausea And Vomiting  . Spinach Nausea And Vomiting   Follow-up Information   Follow up with [name], MD. Schedule an appointment as soon as possible for a visit in 4 weeks. ( )    Specialty:  Gastroenterology   Contact information:   [address], [address] [place] [address] [phone]       Follow up with [name], MD. Schedule an appointment as  soon as possible for a visit in 1 week.   Specialty:  Internal Medicine   Contact information:   [address], [address] [place] [address] [phone]      Labs: Basic Metabolic Panel:  Recent Labs Lab 05/11/13 0900 05/12/13 0237 05/13/13 0250 05/14/13 0535 05/15/13 0419  NA 143 142 142 145 141  K 4.0 4.1 3.5 3.6 3.6  CL 113* 114* 111 110 107  CO2 25 22 25 29 30   GLUCOSE 109* 119* 89 116* 104*  BUN 32* 26* 20 17 9   CREATININE 1.09 1.08 1.31 1.34 1.18  CALCIUM 7.2* 7.3* 7.4* 7.8* 7.8*   Liver Function Tests:  Recent Labs Lab 05/10/13 2020 05/12/13 0237  AST 17 19  ALT 13 14  ALKPHOS 58 39  BILITOT 0.1* 0.4  PROT 4.7* 3.7*  ALBUMIN 2.1* 1.7*    Recent Labs Lab 05/10/13 2020  LIPASE 34   CBC:  Recent Labs Lab 05/10/13 2121  05/13/13 0250 05/13/13 1922 05/14/13 0535 05/15/13 0419 05/16/13 0512  WBC 10.7*  --   --  9.3 7.5 7.0 8.3  NEUTROABS 8.5*  --   --   --   --   --   --   HGB 4.2*  < > 8.6* 8.7* 8.7* 9.0* 9.2*  HCT 13.1*  < > 24.9* 25.3* 25.3* 25.7* 26.9*  MCV 79.4  --   --  83.0 83.0 82.1 82.3  PLT 174  --   --  229 246 242 244  < > = values  in this interval not displayed.  Signed:  Lonia Blood, MD [facility] Office  [phone] Pager [phone]  On-Call/Text Page:      [email or web address]      password [facility]  05/17/2013, 11:27 AM

## 2013-05-16 NOTE — Progress Notes (Signed)
Dr. Evette Cristal returned call for the GI service and explained to him that pt. Was requesting diet advancement.  Orders received for a regular diet.  Will continue to monitor and carry out orders.  Forbes Cellar,  RN

## 2013-05-17 NOTE — Progress Notes (Signed)
Einar Grad to be D/C'd Home per MD order.  Discussed with the patient and all questions fully answered.    Medication List         folic acid 1 MG tablet  Commonly known as:  FOLVITE  Take 1 tablet (1 mg total) by mouth daily.     multivitamin with minerals Tabs tablet  Take 1 tablet by mouth daily.     pantoprazole 40 MG tablet  Commonly known as:  PROTONIX  Take 1 tablet (40 mg total) by mouth 2 (two) times daily.     sucralfate 1 GM/10ML suspension  Commonly known as:  CARAFATE  Take 10 mLs (1 g total) by mouth 4 (four) times daily -  with meals and at bedtime.     thiamine 100 MG tablet  Take 1 tablet (100 mg total) by mouth daily.        VVS, Skin clean, dry and intact without evidence of skin break down, no evidence of skin tears noted. IV catheter discontinued intact. Site without signs and symptoms of complications. Dressing and pressure applied.  An After Visit Summary was printed and given to the patient. Follow up appointments , new prescriptions and medication administration times given. Gave pt handouts and education on duodenal ulcers , multivitamins, thiamine  Patient given bus pass and d/c home via bus.  Cindra Eves, RN 05/17/2013 12:45 PM

## 2013-05-17 NOTE — Progress Notes (Signed)
Doing well. No further signs of GI bleeding. Tolerating solid food without problems. PE: VSS, no distress, Abdomen soft. IMP: GI bleed due to duodenal ulcer. Now stable. Plan: OK for discharge. Avoid ETOH and NSAIDS and Aspirin. Need to be on a PPI BID.           Follow up with his PCP. If he rebleeds return to ER.

## 2013-05-17 NOTE — Care Management Note (Signed)
    Page 1 of 1   05/17/2013     12:53:56 PM   CARE MANAGEMENT NOTE 05/17/2013  Patient:  [name], [name]   Account Number:  [id number]  Date Initiated:  05/14/2013  Documentation initiated by:  [name]  Subjective/Objective Assessment:   Pt admitted with severe anemia / GIB     Action/Plan:   PTA pt lived at shelter/homeless- will consult CSW   Anticipated DC Date:  05/17/2013   Anticipated DC Plan:  HOME/SELF CARE  In-house referral  Clinical Social Worker      DC Planning Services  CM consult      Choice offered to / List presented to:             Status of service:  Completed, signed off Medicare Important Message given?   (If response is "NO", the following Medicare IM given date fields will be blank) Date Medicare IM given:   Date Additional Medicare IM given:    Discharge Disposition:  HOME/SELF CARE  Per UR Regulation:  Reviewed for med. necessity/level of care/duration of stay  If discussed at Long Length of Stay Meetings, dates discussed:   05/16/2013    Comments:  05/17/13 12:51 Ryan Cape RN BSN [phone] patient for dc today, patient has a medicaid apt at 2 pm, MD states patient has capacity in progress note. RN states patient was given a bus pass.  05/14/13- 1145- Ryan Pierini RN, BSN [phone] Has known duodenal ulcer w/ exposed vessel - has now been injected w/ epi x 2 and failed an attempt at selective arterial embolization - under CIWA protocal- may require precedex if does not respond- Psychiatry has seen the patient and has deemed him incompetent to make medical decisions.- CSW consulted for guardianship/HCPOA needs.

## 2013-05-27 ENCOUNTER — Emergency Department (HOSPITAL_COMMUNITY)
Admission: EM | Admit: 2013-05-27 | Discharge: 2013-05-27 | Discharge status: Against Medical Advice | Payer: Medicaid Other | Attending: Emergency Medicine | Admitting: Emergency Medicine

## 2013-05-27 ENCOUNTER — Encounter (HOSPITAL_COMMUNITY): Payer: Self-pay | Admitting: Emergency Medicine

## 2013-05-27 ENCOUNTER — Emergency Department (HOSPITAL_COMMUNITY): Payer: Medicaid Other

## 2013-05-27 DIAGNOSIS — M7989 Other specified soft tissue disorders: Secondary | ICD-10-CM

## 2013-05-27 DIAGNOSIS — R609 Edema, unspecified: Secondary | ICD-10-CM | POA: Insufficient documentation

## 2013-05-27 DIAGNOSIS — F17200 Nicotine dependence, unspecified, uncomplicated: Secondary | ICD-10-CM | POA: Insufficient documentation

## 2013-05-27 DIAGNOSIS — K219 Gastro-esophageal reflux disease without esophagitis: Secondary | ICD-10-CM | POA: Insufficient documentation

## 2013-05-27 DIAGNOSIS — Z79899 Other long term (current) drug therapy: Secondary | ICD-10-CM | POA: Insufficient documentation

## 2013-05-27 DIAGNOSIS — F1010 Alcohol abuse, uncomplicated: Secondary | ICD-10-CM | POA: Insufficient documentation

## 2013-05-27 DIAGNOSIS — G8929 Other chronic pain: Secondary | ICD-10-CM | POA: Insufficient documentation

## 2013-05-27 DIAGNOSIS — F1910 Other psychoactive substance abuse, uncomplicated: Secondary | ICD-10-CM | POA: Insufficient documentation

## 2013-05-27 DIAGNOSIS — R21 Rash and other nonspecific skin eruption: Secondary | ICD-10-CM | POA: Insufficient documentation

## 2013-05-27 DIAGNOSIS — R109 Unspecified abdominal pain: Secondary | ICD-10-CM | POA: Insufficient documentation

## 2013-05-27 DIAGNOSIS — Z8719 Personal history of other diseases of the digestive system: Secondary | ICD-10-CM | POA: Insufficient documentation

## 2013-05-27 LAB — COMPREHENSIVE METABOLIC PANEL
ALT: 19 U/L (ref 0–53)
AST: 30 U/L (ref 0–37)
Albumin: 2.6 g/dL — ABNORMAL LOW (ref 3.5–5.2)
Calcium: 8.6 mg/dL (ref 8.4–10.5)
GFR calc Af Amer: 85 mL/min — ABNORMAL LOW (ref >90)
Sodium: 139 mEq/L (ref 135–145)
Total Bilirubin: 0.2 mg/dL — ABNORMAL LOW (ref 0.3–1.2)
Total Protein: 6 g/dL (ref 6.0–8.3)

## 2013-05-27 LAB — CBC WITH DIFFERENTIAL/PLATELET
Basophils Absolute: 0 10*3/uL (ref 0.0–0.1)
Basophils Relative: 0 % (ref 0–1)
Eosinophils Absolute: 0.2 10*3/uL (ref 0.0–0.7)
Eosinophils Relative: 2 % (ref 0–5)
HCT: 27.1 % — ABNORMAL LOW (ref 39.0–52.0)
MCH: 26.2 pg (ref 26.0–34.0)
MCHC: 31.7 g/dL (ref 30.0–36.0)
MCV: 82.6 fL (ref 78.0–100.0)
Platelets: 335 10*3/uL (ref 150–400)
RDW: 17.1 % — ABNORMAL HIGH (ref 11.5–15.5)
WBC: 8.2 10*3/uL (ref 4.0–10.5)

## 2013-05-27 LAB — URINALYSIS, ROUTINE W REFLEX MICROSCOPIC
Ketones, ur: NEGATIVE mg/dL
Leukocytes, UA: NEGATIVE
Nitrite: NEGATIVE
Specific Gravity, Urine: 1.013 (ref 1.005–1.030)
pH: 6.5 (ref 5.0–8.0)

## 2013-05-27 LAB — RAPID URINE DRUG SCREEN, HOSP PERFORMED: Benzodiazepines: NOT DETECTED

## 2013-05-27 LAB — ETHANOL: Alcohol, Ethyl (B): <11 mg/dL (ref 0–11)

## 2013-05-27 LAB — LIPASE, BLOOD: Lipase: 42 U/L (ref 11–59)

## 2013-05-27 NOTE — ED Provider Notes (Signed)
CSN: [id number]     Arrival date & time 05/27/13  4403 History   First MD Initiated Contact with Patient 05/27/13 0827     Chief Complaint  Patient presents with  . Leg Swelling   (Consider location/radiation/quality/duration/timing/severity/associated sxs/prior Treatment) The history is provided by the patient.   52 year old male brought in by self. Complaining of swelling to both legs for 2 weeks. However patient was admitted on October 10 and they may note during that admission. Leg swelling. Patient was admitted at that time for acute blood loss anemia due to GI bleeding. Patient was transfused. Patient denies any abdominal pain chest pain or shortness of breath. Patient believes that the leg swelling is new however it was clearly present during admission on the 10th. However that could have been approximately 2 weeks ago that just developed. Patient has a long history of substance abuse and alcohol abuse. Patient is normally seen for abdominal pain is recurrent and chronic in nature. Patient denies any vomiting of blood or any blood in his stools today. Leg swelling is not associated with any pain.  Past Medical History  Diagnosis Date  . Abdominal  pain, other specified site   . GERD (gastroesophageal reflux disease)   . Abnormal CT scan, esophagus 03/2012  . Substance abuse 03/2012    tox screen positive for cocaine.    Past Surgical History  Procedure Laterality Date  . Undescended testicle      on right. noted on 03/2012 CT scan.  no surgery referral.   . Esophagogastroduodenoscopy  08/10/2012    Procedure: ESOPHAGOGASTRODUODENOSCOPY (EGD);  Surgeon: [name], MD;  Location: [facility] ENDOSCOPY;  Service: Endoscopy;  Laterality: N/A;  will be done in room 1   . Laparotomy  08/10/2012    Procedure: EXPLORATORY LAPAROTOMY;  Surgeon: [name], MD;  Location: [facility] OR;  Service: General;  Laterality: N/A;  . Esophagogastroduodenoscopy Left 05/01/2013    Procedure:  ESOPHAGOGASTRODUODENOSCOPY (EGD);  Surgeon: [name], MD;  Location: [facility] ENDOSCOPY;  Service: Endoscopy;  Laterality: Left;  . Esophagogastroduodenoscopy N/A 05/11/2013    Procedure: ESOPHAGOGASTRODUODENOSCOPY (EGD);  Surgeon: [name], MD;  Location[facility] ENDOSCOPY;  Service: Endoscopy;  Laterality: N/A;   No family history on file. History  Substance Use Topics  . Smoking status: Light Tobacco Smoker -- 1.00 packs/day for 34 years  . Smokeless tobacco: Never Used  . Alcohol Use: Yes     Comment: 40oz beer/day    Review of Systems  Constitutional: Negative for fever.  HENT: Negative for congestion.   Eyes: Negative for redness.  Respiratory: Negative for shortness of breath.   Cardiovascular: Positive for leg swelling. Negative for chest pain.  Gastrointestinal: Negative for abdominal pain.  Genitourinary: Negative for dysuria.  Musculoskeletal: Negative for back pain.  Skin: Positive for rash.  Neurological: Negative for headaches.  Hematological: Does not bruise/bleed easily.  Psychiatric/Behavioral: Negative for confusion.    Allergies  Chocolate; Peanut-containing drug products; and Spinach  Home Medications   Current Outpatient Rx  Name  Route  Sig  Dispense  Refill  . pantoprazole (PROTONIX) 40 MG tablet   Oral   Take 1 tablet (40 mg total) by mouth 2 (two) times daily.   60 tablet   12   . sucralfate (CARAFATE) 1 GM/10ML suspension   Oral   Take 10 mLs (1 g total) by mouth 4 (four) times daily -  with meals and at bedtime.   420 mL   0  BP 153/87  Pulse 96  Temp(Src) 98.3 F (36.8 C) (Oral)  Resp 25  Ht 5\' 9"  (1.753 m)  Wt 160 lb (72.576 kg)  BMI 23.62 kg/m2  SpO2 100% Physical Exam  Nursing note and vitals reviewed. Constitutional: He is oriented to person, place, and time. He appears well-developed and well-nourished. No distress.  HENT:  Head: Normocephalic and atraumatic.  Mouth/Throat: Oropharynx is clear and moist.  Eyes:  Conjunctivae and EOM are normal. Pupils are equal, round, and reactive to light.  Neck: Normal range of motion. Neck supple.  Cardiovascular: Normal rate, regular rhythm and normal heart sounds.   No murmur heard. Pulmonary/Chest: Breath sounds normal. No respiratory distress.  Abdominal: Soft. Bowel sounds are normal. There is no tenderness.  Musculoskeletal: He exhibits edema.  Significant swelling of both legs. This was identified during his recent hospitalization which occurred on October 10. There is some chronic skin changes in the anterior part of the leg with some scabbing but no evidence of cellulitis. No erythema no increased warmth. Marked pitting edema.  Neurological: He is alert and oriented to person, place, and time. No cranial nerve deficit. He exhibits normal muscle tone. Coordination normal.  Skin: Skin is warm. No rash noted.    ED Course  Procedures (including critical care time) Labs Review Labs Reviewed  PRO B NATRIURETIC PEPTIDE - Abnormal; Notable for the following:    Pro B Natriuretic peptide (BNP) 171.5 (*)    All other components within normal limits  CBC WITH DIFFERENTIAL - Abnormal; Notable for the following:    RBC 3.28 (*)    Hemoglobin 8.6 (*)    HCT 27.1 (*)    RDW 17.1 (*)    Monocytes Relative 13 (*)    All other components within normal limits  COMPREHENSIVE METABOLIC PANEL - Abnormal; Notable for the following:    Albumin 2.6 (*)    Total Bilirubin 0.2 (*)    GFR calc non Af Amer 73 (*)    GFR calc Af Amer 85 (*)    All other components within normal limits  PROTIME-INR  LIPASE, BLOOD  URINALYSIS, ROUTINE W REFLEX MICROSCOPIC  URINE RAPID DRUG SCREEN (HOSP PERFORMED)  ETHANOL   Results for orders placed during the hospital encounter of 05/27/13  PRO B NATRIURETIC PEPTIDE      Result Value Range   Pro B Natriuretic peptide (BNP) 171.5 (*) 0 - 125 pg/mL  CBC WITH DIFFERENTIAL      Result Value Range   WBC 8.2  4.0 - 10.5 K/uL   RBC 3.28  (*) 4.22 - 5.81 MIL/uL   Hemoglobin 8.6 (*) 13.0 - 17.0 g/dL   HCT 81.1 (*) 91.4 - 78.2 %   MCV 82.6  78.0 - 100.0 fL   MCH 26.2  26.0 - 34.0 pg   MCHC 31.7  30.0 - 36.0 g/dL   RDW 95.6 (*) 21.3 - 08.6 %   Platelets 335  150 - 400 K/uL   Neutrophils Relative % 71  43 - 77 %   Neutro Abs 5.8  1.7 - 7.7 K/uL   Lymphocytes Relative 14  12 - 46 %   Lymphs Abs 1.1  0.7 - 4.0 K/uL   Monocytes Relative 13 (*) 3 - 12 %   Monocytes Absolute 1.0  0.1 - 1.0 K/uL   Eosinophils Relative 2  0 - 5 %   Eosinophils Absolute 0.2  0.0 - 0.7 K/uL   Basophils Relative 0  0 - 1 %  Basophils Absolute 0.0  0.0 - 0.1 K/uL  COMPREHENSIVE METABOLIC PANEL      Result Value Range   Sodium 139  135 - 145 mEq/L   Potassium 3.9  3.5 - 5.1 mEq/L   Chloride 105  96 - 112 mEq/L   CO2 28  19 - 32 mEq/L   Glucose, Bld 98  70 - 99 mg/dL   BUN 13  6 - 23 mg/dL   Creatinine, Ser 4.09  0.50 - 1.35 mg/dL   Calcium 8.6  8.4 - 81.1 mg/dL   Total Protein 6.0  6.0 - 8.3 g/dL   Albumin 2.6 (*) 3.5 - 5.2 g/dL   AST 30  0 - 37 U/L   ALT 19  0 - 53 U/L   Alkaline Phosphatase 71  39 - 117 U/L   Total Bilirubin 0.2 (*) 0.3 - 1.2 mg/dL   GFR calc non Af Amer 73 (*) >90 mL/min   GFR calc Af Amer 85 (*) >90 mL/min  PROTIME-INR      Result Value Range   Prothrombin Time 12.6  11.6 - 15.2 seconds   INR 0.96  0.00 - 1.49  LIPASE, BLOOD      Result Value Range   Lipase 42  11 - 59 U/L  URINALYSIS, ROUTINE W REFLEX MICROSCOPIC      Result Value Range   Color, Urine YELLOW  YELLOW   APPearance CLEAR  CLEAR   Specific Gravity, Urine 1.013  1.005 - 1.030   pH 6.5  5.0 - 8.0   Glucose, UA NEGATIVE  NEGATIVE mg/dL   Hgb urine dipstick NEGATIVE  NEGATIVE   Bilirubin Urine NEGATIVE  NEGATIVE   Ketones, ur NEGATIVE  NEGATIVE mg/dL   Protein, ur NEGATIVE  NEGATIVE mg/dL   Urobilinogen, UA 0.2  0.0 - 1.0 mg/dL   Nitrite NEGATIVE  NEGATIVE   Leukocytes, UA NEGATIVE  NEGATIVE  URINE RAPID DRUG SCREEN (HOSP PERFORMED)      Result  Value Range   Opiates NONE DETECTED  NONE DETECTED   Cocaine NONE DETECTED  NONE DETECTED   Benzodiazepines NONE DETECTED  NONE DETECTED   Amphetamines NONE DETECTED  NONE DETECTED   Tetrahydrocannabinol NONE DETECTED  NONE DETECTED   Barbiturates NONE DETECTED  NONE DETECTED  ETHANOL      Result Value Range   Alcohol, Ethyl (B) <11  0 - 11 mg/dL    Imaging Review Dg Chest 2 View  05/27/2013   CLINICAL DATA:  Leg swelling  EXAM: CHEST  2 VIEW  COMPARISON:  05/11/2013  FINDINGS: The heart size appears normal. No pleural effusion or edema. No airspace consolidation identified. Left apical scarring appears similar to previous examination. No airspace stress set no acute airspace opacities identified.  IMPRESSION: 1. No acute cardiopulmonary abnormalities.  2. Left apical scarring.   Electronically Signed   By: Signa Kell M.D.   On: 05/27/2013 09:12    EKG Interpretation     Ventricular Rate:  81 PR Interval:  164 QRS Duration: 81 QT Interval:  368 QTC Calculation: 427 R Axis:   76 Text Interpretation:  Sinus rhythm Probable left ventricular hypertrophy ST elev, probable normal early repol pattern Otherwise no significant change           the early repull pattern seems to be new. Otherwise no significant change.  MDM   1. Swelling of left lower extremity   2. Swelling of right lower extremity    Patient insisted on leaving  AGAINST MEDICAL ADVICE. Patient's workup was not complete for the lower trimming the swelling. Troponin was still pending. However his renal function without significant abnormalities. His previous significant anemia seems to be stabilized. Chest x-ray is negative for pulmonary edema congestive heart failure pneumonia or pneumothorax however he does have an elevated BNP so some concern for cardiac abnormality is there. Is planning on referring patient to cardiology but he insists on leaving now before the troponin is back. Patient understands the risk of  being discharged if he has had a recent myocardial infarction. This could include death.    Shelda Jakes, MD 05/27/13 [phone]

## 2013-05-27 NOTE — ED Notes (Signed)
Pt stating that he cannot wait any longer & that he needs to leave now. ED MD at bedside talking with pt

## 2013-05-27 NOTE — ED Notes (Signed)
Patient transported to X-ray 

## 2013-05-27 NOTE — ED Notes (Signed)
C/o worsening BLE swelling x 2 weeks

## 2013-05-27 NOTE — ED Notes (Signed)
Gave patient urinal and advised that we need urine sample.

## 2013-05-27 NOTE — ED Notes (Signed)
Report received, assumed care.  

## 2014-02-24 ENCOUNTER — Emergency Department (HOSPITAL_COMMUNITY): Payer: Medicaid Other

## 2014-02-24 ENCOUNTER — Emergency Department (HOSPITAL_COMMUNITY)
Admission: EM | Admit: 2014-02-24 | Discharge: 2014-02-24 | Disposition: A | Discharge status: Home / Self Care | Payer: Medicaid Other | Attending: Emergency Medicine | Admitting: Emergency Medicine

## 2014-02-24 DIAGNOSIS — F17200 Nicotine dependence, unspecified, uncomplicated: Secondary | ICD-10-CM | POA: Insufficient documentation

## 2014-02-24 DIAGNOSIS — R220 Localized swelling, mass and lump, head: Secondary | ICD-10-CM | POA: Insufficient documentation

## 2014-02-24 DIAGNOSIS — R221 Localized swelling, mass and lump, neck: Secondary | ICD-10-CM | POA: Diagnosis present

## 2014-02-24 DIAGNOSIS — K047 Periapical abscess without sinus: Secondary | ICD-10-CM | POA: Diagnosis not present

## 2014-02-24 LAB — CBC WITH DIFFERENTIAL/PLATELET
Basophils Absolute: 0 10*3/uL (ref 0.0–0.1)
Basophils Relative: 0 % (ref 0–1)
EOS ABS: 0 10*3/uL (ref 0.0–0.7)
EOS PCT: 0 % (ref 0–5)
HEMATOCRIT: 40.6 % (ref 39.0–52.0)
Hemoglobin: 13.2 g/dL (ref 13.0–17.0)
LYMPHS ABS: 1.4 10*3/uL (ref 0.7–4.0)
Lymphocytes Relative: 12 % (ref 12–46)
MCH: 25.8 pg — AB (ref 26.0–34.0)
MCHC: 32.5 g/dL (ref 30.0–36.0)
MCV: 79.5 fL (ref 78.0–100.0)
MONO ABS: 1.2 10*3/uL — AB (ref 0.1–1.0)
Monocytes Relative: 10 % (ref 3–12)
Neutro Abs: 9 10*3/uL — ABNORMAL HIGH (ref 1.7–7.7)
Neutrophils Relative %: 78 % — ABNORMAL HIGH (ref 43–77)
PLATELETS: 161 10*3/uL (ref 150–400)
RBC: 5.11 MIL/uL (ref 4.22–5.81)
RDW: 15.6 % — ABNORMAL HIGH (ref 11.5–15.5)
WBC: 11.6 10*3/uL — ABNORMAL HIGH (ref 4.0–10.5)

## 2014-02-24 LAB — COMPREHENSIVE METABOLIC PANEL
ALT: 10 U/L (ref 0–53)
ANION GAP: 10 (ref 5–15)
AST: 19 U/L (ref 0–37)
Albumin: 3.3 g/dL — ABNORMAL LOW (ref 3.5–5.2)
Alkaline Phosphatase: 96 U/L (ref 39–117)
BUN: 10 mg/dL (ref 6–23)
CALCIUM: 9.1 mg/dL (ref 8.4–10.5)
CO2: 28 meq/L (ref 19–32)
CREATININE: 1.22 mg/dL (ref 0.50–1.35)
Chloride: 101 mEq/L (ref 96–112)
GFR, EST AFRICAN AMERICAN: 77 mL/min — AB (ref >90)
GFR, EST NON AFRICAN AMERICAN: 66 mL/min — AB (ref >90)
Glucose, Bld: 94 mg/dL (ref 70–99)
Potassium: 4.1 mEq/L (ref 3.7–5.3)
SODIUM: 139 meq/L (ref 137–147)
Total Bilirubin: 0.7 mg/dL (ref 0.3–1.2)
Total Protein: 7.4 g/dL (ref 6.0–8.3)

## 2014-02-24 MED ORDER — PENICILLIN V POTASSIUM 500 MG PO TABS
500.0000 mg | ORAL_TABLET | Freq: Once | ORAL | Status: AC
  Administered 2014-02-24: 500 mg via ORAL
  Filled 2014-02-24: qty 1

## 2014-02-24 MED ORDER — SODIUM CHLORIDE 0.9 % IV SOLN
1000.0000 mL | INTRAVENOUS | Status: DC
  Administered 2014-02-24: 1000 mL via INTRAVENOUS

## 2014-02-24 MED ORDER — MORPHINE SULFATE 4 MG/ML IJ SOLN: 6.0000 mg | Freq: Once | INTRAMUSCULAR | Status: DC

## 2014-02-24 MED ORDER — SODIUM CHLORIDE 0.9 % IV SOLN
1000.0000 mL | Freq: Once | INTRAVENOUS | Status: AC
  Administered 2014-02-24: 1000 mL via INTRAVENOUS

## 2014-02-24 MED ORDER — IOHEXOL 300 MG/ML  SOLN
100.0000 mL | Freq: Once | INTRAMUSCULAR | Status: AC | PRN
  Administered 2014-02-24: 100 mL via INTRAVENOUS

## 2014-02-24 MED ORDER — HYDROCODONE-ACETAMINOPHEN 5-325 MG PO TABS: 1.0000 | ORAL_TABLET | ORAL | Status: DC | PRN

## 2014-02-24 MED ORDER — PENICILLIN V POTASSIUM 500 MG PO TABS: 500.0000 mg | ORAL_TABLET | Freq: Three times a day (TID) | ORAL | Status: AC

## 2014-02-24 NOTE — ED Notes (Signed)
Pt reports on Saturday his right eye started swelling, since then swelling has progressed. Right side of face noticeable swollen. Denies pain. Denies SOB.

## 2014-02-24 NOTE — ED Provider Notes (Signed)
CSN: [id number]     Arrival date & time 02/24/14  1043 History   First MD Initiated Contact with Patient 02/24/14 1130     Chief Complaint  Patient presents with  . Facial Swelling  . eye swelling       HPI Patient presents with poor dentition and worsening right-sided facial pain and swelling of the past several days.  He denies dental pain.  He states his pain is moderate in severity.  Nothing worsens or improves his pain.  No change in his vision.  No pain with extraocular movement.  No recent trauma or injury.  No difficulty breathing or swallowing   Past Medical History  Diagnosis Date  . Abdominal  pain, other specified site   . GERD (gastroesophageal reflux disease)   . Abnormal CT scan, esophagus 03/2012  . Substance abuse 03/2012    tox screen positive for cocaine.    Past Surgical History  Procedure Laterality Date  . Undescended testicle      on right. noted on 03/2012 CT scan.  no surgery referral.   . Esophagogastroduodenoscopy  08/10/2012    Procedure: ESOPHAGOGASTRODUODENOSCOPY (EGD);  Surgeon: [name], MD;  Location: [facility] ENDOSCOPY;  Service: Endoscopy;  Laterality: N/A;  will be done in room 1   . Laparotomy  08/10/2012    Procedure: EXPLORATORY LAPAROTOMY;  Surgeon: [name], MD;  Location: [facility] OR;  Service: General;  Laterality: N/A;  . Esophagogastroduodenoscopy Left 05/01/2013    Procedure: ESOPHAGOGASTRODUODENOSCOPY (EGD);  Surgeon: [name], MD;  Location: [facility] ENDOSCOPY;  Service: Endoscopy;  Laterality: Left;  . Esophagogastroduodenoscopy N/A 05/11/2013    Procedure: ESOPHAGOGASTRODUODENOSCOPY (EGD);  Surgeon: [name], MD;  Location: [facility] ENDOSCOPY;  Service: Endoscopy;  Laterality: N/A;   No family history on file. History  Substance Use Topics  . Smoking status: Light Tobacco Smoker -- 1.00 packs/day for 34 years  . Smokeless tobacco: Never Used  . Alcohol Use: Yes     Comment: 40oz beer/day    Review of Systems  All other systems  reviewed and are negative.     Allergies  Chocolate; Peanut-containing drug products; and Spinach  Home Medications   Prior to Admission medications   Medication Sig Start Date End Date Taking? Authorizing Provider  acetaminophen (TYLENOL) 500 MG tablet Take 1,500 mg by mouth every 6 (six) hours as needed for mild pain.   Yes Historical Provider, MD  HYDROcodone-acetaminophen (NORCO/VICODIN) 5-325 MG per tablet Take 1 tablet by mouth every 4 (four) hours as needed for moderate pain. 02/24/14   Lyanne Co, MD  penicillin v potassium (VEETID) 500 MG tablet Take 1 tablet (500 mg total) by mouth 3 (three) times daily. 02/24/14 03/03/14  Lyanne Co, MD   BP 157/94  Pulse 73  Temp(Src) 98.8 F (37.1 C) (Oral)  Resp 16  SpO2 98% Physical Exam  Nursing note and vitals reviewed. Constitutional: He is oriented to person, place, and time. He appears well-developed and well-nourished.  HENT:  Poor dentition throughout.  Significant swelling of his right anterior maxillary sinus region.  Extraocular movements are intact.  Vision is normal.  Small fluctuance noted near tooth #6 without drainage  Eyes: EOM are normal.  Neck: Normal range of motion.  Cardiovascular: Normal rate, regular rhythm, normal heart sounds and intact distal pulses.   Pulmonary/Chest: Effort normal and breath sounds normal. No respiratory distress.  Abdominal: Soft. He exhibits no distension. There is no tenderness.  Musculoskeletal: Normal range of  motion.  Neurological: He is alert and oriented to person, place, and time.  Skin: Skin is warm and dry.  Psychiatric: He has a normal mood and affect. Judgment normal.    ED Course  Procedures (including critical care time) INCISION AND DRAINAGE Performed by: Lyanne CoAMPOS,Bree Heinzelman M Consent: Verbal consent obtained. Risks and benefits: risks, benefits and alternatives were discussed Time out performed prior to procedure Type: abscess Body area: Tooth #6 Anesthesia: local  infiltration Incision was made with a scalpel. Local anesthetic: lidocaine 2 % with epinephrine Anesthetic total: 3 ml Complexity: complex Drainage: purulent Drainage amount: Large  Packing material: None  Patient tolerance: Patient tolerated the procedure well with no immediate complications.     Labs Review Labs Reviewed  CBC WITH DIFFERENTIAL - Abnormal; Notable for the following:    WBC 11.6 (*)    MCH 25.8 (*)    RDW 15.6 (*)    Neutrophils Relative % 78 (*)    Neutro Abs 9.0 (*)    Monocytes Absolute 1.2 (*)    All other components within normal limits  COMPREHENSIVE METABOLIC PANEL - Abnormal; Notable for the following:    Albumin 3.3 (*)    GFR calc non Af Amer 66 (*)    GFR calc Af Amer 77 (*)    All other components within normal limits    Imaging Review Ct Maxillofacial W/cm  02/24/2014   CLINICAL DATA:  Right-sided facial swelling.  Sinus pain.  EXAM: CT MAXILLOFACIAL WITH CONTRAST  TECHNIQUE: Multidetector CT imaging of the maxillofacial structures was performed with intravenous contrast. Multiplanar CT image reconstructions were also generated. A small metallic BB was placed on the right temple in order to reliably differentiate right from left.  CONTRAST:  100 mL OMNIPAQUE IOHEXOL 300 MG/ML  SOLN  COMPARISON:  None.  FINDINGS: Soft tissues about the face are swollen, particularly on the right. A subperiosteal abscess is seen along the superior alveolar ridge on the right measuring 2.1 cm AP by 0.6 cm transverse by 2.0 cm craniocaudal. The subperiosteal abscess appears to emanate from tooth number 6 where there is a break in cortical bone and extensive periapical lucency. There is some mucosal thickening right maxillary sinus without an air-fluid level. The patient is missing multiple teeth. Multiple cavities are identified. Periapical abscesses are seen about teeth number 11, 22 and 29.  The orbits are unremarkable. The imaged intracranial contents appear normal. The  patient is status post left maxillary antrostomy. Mastoid air cells are clear.  IMPRESSION: Subperiosteal abscess along the right aspect of the superior alveolar ridge secondary to dental disease with a large periapical abscess and cortical break at tooth number 6.  Extensive dental disease with periapical abscesses seen about teeth number 11,22 and 29.  Mucosal thickening right maxillary sinus.   Electronically Signed   By: Drusilla Kannerhomas  Dalessio M.D.   On: 02/24/2014 14:08  I personally reviewed the imaging tests through PACS system I reviewed available ER/hospitalization records through the EMR    EKG Interpretation None      MDM   Final diagnoses:  Dental abscess    Incision and drainage of right dental abscess.  Large pus encountered.  Patient feels much better.  Home with antibiotics.  Outpatient followup with oral surgery.    Lyanne CoKevin M Daris Aristizabal, MD 02/24/14 [phone]

## 2014-02-24 NOTE — ED Notes (Signed)
Pt sts he woke up with swollen eye/face, sts he tried using ice-packs without any relief. Denies pain sts it feels tight and "It looks awful, I don't want to look at myself".

## 2014-02-24 NOTE — ED Notes (Signed)
While starting an PIV explained to pt what CT exam is for, he is asking repeatedly "Is that gonna make it go away". Explained to him that CT is diagnostic tool that will help doctor treat him, however he keeps saying " I came here for you to get rid off this, it;s ugly, I can't look at myself". Pt education done, pt voiced understanding.

## 2014-02-24 NOTE — ED Notes (Signed)
Bed: WA07 Expected date:  Expected time:  Means of arrival:  Comments: 

## 2014-02-24 NOTE — Discharge Instructions (Signed)

## 2014-10-05 ENCOUNTER — Emergency Department (HOSPITAL_COMMUNITY): Payer: Medicaid Other

## 2014-10-05 ENCOUNTER — Inpatient Hospital Stay (HOSPITAL_COMMUNITY)
Admission: EM | Admit: 2014-10-05 | Discharge: 2014-10-07 | DRG: 377 | Discharge status: Against Medical Advice | Payer: Medicaid Other | Attending: Pulmonary Disease | Admitting: Pulmonary Disease

## 2014-10-05 ENCOUNTER — Encounter (HOSPITAL_COMMUNITY): Payer: Self-pay | Admitting: Emergency Medicine

## 2014-10-05 DIAGNOSIS — K264 Chronic or unspecified duodenal ulcer with hemorrhage: Secondary | ICD-10-CM | POA: Diagnosis not present

## 2014-10-05 DIAGNOSIS — F1020 Alcohol dependence, uncomplicated: Secondary | ICD-10-CM | POA: Diagnosis present

## 2014-10-05 DIAGNOSIS — R578 Other shock: Secondary | ICD-10-CM | POA: Diagnosis present

## 2014-10-05 DIAGNOSIS — R42 Dizziness and giddiness: Secondary | ICD-10-CM | POA: Diagnosis present

## 2014-10-05 DIAGNOSIS — K922 Gastrointestinal hemorrhage, unspecified: Secondary | ICD-10-CM | POA: Diagnosis not present

## 2014-10-05 DIAGNOSIS — Z8711 Personal history of peptic ulcer disease: Secondary | ICD-10-CM | POA: Diagnosis not present

## 2014-10-05 DIAGNOSIS — N179 Acute kidney failure, unspecified: Secondary | ICD-10-CM | POA: Diagnosis present

## 2014-10-05 DIAGNOSIS — E861 Hypovolemia: Secondary | ICD-10-CM | POA: Diagnosis present

## 2014-10-05 DIAGNOSIS — Z9119 Patient's noncompliance with other medical treatment and regimen: Secondary | ICD-10-CM | POA: Diagnosis present

## 2014-10-05 DIAGNOSIS — F1010 Alcohol abuse, uncomplicated: Secondary | ICD-10-CM | POA: Diagnosis not present

## 2014-10-05 DIAGNOSIS — D62 Acute posthemorrhagic anemia: Secondary | ICD-10-CM | POA: Diagnosis present

## 2014-10-05 DIAGNOSIS — F17200 Nicotine dependence, unspecified, uncomplicated: Secondary | ICD-10-CM | POA: Diagnosis present

## 2014-10-05 DIAGNOSIS — E878 Other disorders of electrolyte and fluid balance, not elsewhere classified: Secondary | ICD-10-CM | POA: Diagnosis present

## 2014-10-05 DIAGNOSIS — K219 Gastro-esophageal reflux disease without esophagitis: Secondary | ICD-10-CM | POA: Diagnosis present

## 2014-10-05 DIAGNOSIS — J690 Pneumonitis due to inhalation of food and vomit: Secondary | ICD-10-CM | POA: Diagnosis not present

## 2014-10-05 DIAGNOSIS — K921 Melena: Secondary | ICD-10-CM | POA: Diagnosis not present

## 2014-10-05 LAB — COMPREHENSIVE METABOLIC PANEL
ALBUMIN: 1.9 g/dL — AB (ref 3.5–5.2)
ALT: 11 U/L (ref 0–53)
AST: 18 U/L (ref 0–37)
Alkaline Phosphatase: 26 U/L — ABNORMAL LOW (ref 39–117)
Anion gap: 4 — ABNORMAL LOW (ref 5–15)
BUN: 43 mg/dL — ABNORMAL HIGH (ref 6–23)
CO2: 27 mmol/L (ref 19–32)
CREATININE: 1.52 mg/dL — AB (ref 0.50–1.35)
Calcium: 7.2 mg/dL — ABNORMAL LOW (ref 8.4–10.5)
Chloride: 107 mmol/L (ref 96–112)
GFR, EST AFRICAN AMERICAN: 58 mL/min — AB (ref >90)
GFR, EST NON AFRICAN AMERICAN: 50 mL/min — AB (ref >90)
GLUCOSE: 154 mg/dL — AB (ref 70–99)
POTASSIUM: 4.1 mmol/L (ref 3.5–5.1)
Sodium: 138 mmol/L (ref 135–145)
Total Bilirubin: 0.4 mg/dL (ref 0.3–1.2)
Total Protein: 3.5 g/dL — ABNORMAL LOW (ref 6.0–8.3)

## 2014-10-05 LAB — CBC WITH DIFFERENTIAL/PLATELET
Basophils Absolute: 0 10*3/uL (ref 0.0–0.1)
Basophils Relative: 0 % (ref 0–1)
EOS ABS: 0 10*3/uL (ref 0.0–0.7)
Eosinophils Relative: 0 % (ref 0–5)
HCT: 18 % — ABNORMAL LOW (ref 39.0–52.0)
Hemoglobin: 6 g/dL — CL (ref 13.0–17.0)
LYMPHS PCT: 16 % (ref 12–46)
Lymphs Abs: 2.4 10*3/uL (ref 0.7–4.0)
MCH: 27.8 pg (ref 26.0–34.0)
MCHC: 33.3 g/dL (ref 30.0–36.0)
MCV: 83.3 fL (ref 78.0–100.0)
MONOS PCT: 6 % (ref 3–12)
Monocytes Absolute: 0.9 10*3/uL (ref 0.1–1.0)
Neutro Abs: 11.5 10*3/uL — ABNORMAL HIGH (ref 1.7–7.7)
Neutrophils Relative %: 78 % — ABNORMAL HIGH (ref 43–77)
Platelets: 143 10*3/uL — ABNORMAL LOW (ref 150–400)
RBC: 2.16 MIL/uL — ABNORMAL LOW (ref 4.22–5.81)
RDW: 13.7 % (ref 11.5–15.5)
WBC: 14.8 10*3/uL — AB (ref 4.0–10.5)

## 2014-10-05 LAB — PREPARE RBC (CROSSMATCH)

## 2014-10-05 LAB — TROPONIN I: Troponin I: <0.03 ng/mL (ref <0.031)

## 2014-10-05 MED ORDER — THIAMINE HCL 100 MG/ML IJ SOLN
100.0000 mg | Freq: Every day | INTRAMUSCULAR | Status: DC
Start: 2014-10-06 — End: 2014-10-07
  Administered 2014-10-06: 100 mg via INTRAVENOUS
  Filled 2014-10-05 (×2): qty 1

## 2014-10-05 MED ORDER — SODIUM CHLORIDE 0.9 % IV SOLN: 10.0000 mL/h | Freq: Once | INTRAVENOUS | Status: DC

## 2014-10-05 MED ORDER — SODIUM CHLORIDE 0.9 % IV BOLUS (SEPSIS)
1000.0000 mL | Freq: Once | INTRAVENOUS | Status: AC
  Administered 2014-10-05: 1000 mL via INTRAVENOUS

## 2014-10-05 MED ORDER — DIAZEPAM 5 MG/ML IJ SOLN
5.0000 mg | INTRAMUSCULAR | Status: DC | PRN
  Administered 2014-10-07 (×2): 5 mg via INTRAVENOUS
  Filled 2014-10-05 (×2): qty 2

## 2014-10-05 MED ORDER — SODIUM CHLORIDE 0.9 % IV SOLN: 250.0000 mL | INTRAVENOUS | Status: DC | PRN

## 2014-10-05 MED ORDER — FOLIC ACID 5 MG/ML IJ SOLN
1.0000 mg | Freq: Every day | INTRAMUSCULAR | Status: DC
Start: 2014-10-06 — End: 2014-10-07
  Administered 2014-10-06: 1 mg via INTRAVENOUS
  Filled 2014-10-05 (×2): qty 0.2

## 2014-10-05 MED ORDER — SODIUM CHLORIDE 0.9 % IV SOLN
80.0000 mg | Freq: Once | INTRAVENOUS | Status: AC
  Administered 2014-10-05: 80 mg via INTRAVENOUS
  Filled 2014-10-05: qty 80

## 2014-10-05 MED ORDER — SODIUM CHLORIDE 0.9 % IV SOLN
Freq: Once | INTRAVENOUS | Status: AC
  Administered 2014-10-05: 23:00:00 via INTRAVENOUS

## 2014-10-05 MED ORDER — SODIUM CHLORIDE 0.9 % IV SOLN
8.0000 mg/h | INTRAVENOUS | Status: DC
  Administered 2014-10-05 – 2014-10-06 (×3): 8 mg/h via INTRAVENOUS
  Filled 2014-10-05 (×7): qty 80

## 2014-10-05 MED ORDER — PANTOPRAZOLE SODIUM 40 MG IV SOLR: 40.0000 mg | Freq: Two times a day (BID) | INTRAVENOUS | Status: DC

## 2014-10-05 NOTE — ED Provider Notes (Signed)
CSN: [id number]     Arrival date & time 10/05/14  1924 History   First MD Initiated Contact with Patient 10/05/14 1957     Chief Complaint  Patient presents with  . Hypotension  . Near Syncope     Patient is a 54 y.o. male presenting with near-syncope. The history is provided by the patient. No language interpreter was used.  Near Syncope   [name] presents for evaluation of near syncope. He reports feeling sick for the last 3 days and not wanting to eat or drink. He reports no oral intake for 3 days. He has nausea, slight diarrhea. He feels very dizzy every time he gets up and he feels it is going to pass out. He reports night sweats. He denies any fevers, chest pain, shortness of breath, vomiting, hematochezia or melena. He denies any medical problems. He reports not wanting to be here. He denies any suicidal thoughts or recent suicide attempts. Symptoms are severe, constant, worsening. Patient is a poor historian.  He does not want to answer many questions.    Past Medical History  Diagnosis Date  . Abdominal pain, other specified site   . GERD (gastroesophageal reflux disease)   . Abnormal CT scan, esophagus 03/2012  . Substance abuse 03/2012    tox screen positive for cocaine.    Past Surgical History  Procedure Laterality Date  . Undescended testicle      on right. noted on 03/2012 CT scan.  no surgery referral.   . Esophagogastroduodenoscopy  08/10/2012    Procedure: ESOPHAGOGASTRODUODENOSCOPY (EGD);  Surgeon: [name], MD;  Location: [facility] ENDOSCOPY;  Service: Endoscopy;  Laterality: N/A;  will be done in room 1   . Laparotomy  08/10/2012    Procedure: EXPLORATORY LAPAROTOMY;  Surgeon: [name], MD;  Location: [facility] OR;  Service: General;  Laterality: N/A;  . Esophagogastroduodenoscopy Left 05/01/2013    Procedure: ESOPHAGOGASTRODUODENOSCOPY (EGD);  Surgeon: Charna , MD;  Location: [facility] ENDOSCOPY;  Service: Endoscopy;  Laterality: Left;  . Esophagogastroduodenoscopy N/A  05/11/2013    Procedure: ESOPHAGOGASTRODUODENOSCOPY (EGD);  Surgeon: [name], MD;  Location[facility] ENDOSCOPY;  Service: Endoscopy;  Laterality: N/A;   History reviewed. No pertinent family history. History  Substance Use Topics  . Smoking status: Light Tobacco Smoker -- 1.00 packs/day for 34 years  . Smokeless tobacco: Never Used  . Alcohol Use: Yes     Comment: 40oz beer/day    Review of Systems  Cardiovascular: Positive for near-syncope.  All other systems reviewed and are negative.     Allergies  Chocolate; Peanut-containing drug products; and Spinach  Home Medications   Prior to Admission medications   Medication Sig Start Date End Date Taking? Authorizing Provider  acetaminophen (TYLENOL) 500 MG tablet Take 1,500 mg by mouth every 6 (six) hours as needed for mild pain.    Historical Provider, MD  HYDROcodone-acetaminophen (NORCO/VICODIN) 5-325 MG per tablet Take 1 tablet by mouth every 4 (four) hours as needed for moderate pain. 02/24/14   Lyanne Co, MD   BP 111/89 mmHg  Pulse 99  Temp(Src) 97.9 F (36.6 C) (Oral)  Resp 30  Ht  (1.753 m)  Wt 160 lb (72.576 kg)  BMI 23.62 kg/m2  SpO2 100% Physical Exam  Constitutional: He is oriented to person, place, and time. He appears well-developed and well-nourished. He appears distressed.  HENT:  Head: Normocephalic and atraumatic.  Dried blood in the right nare  Eyes: Pupils are equal, round,  and reactive to light.  Cardiovascular: Normal rate and regular rhythm.   No murmur heard. Pulmonary/Chest: Effort normal and breath sounds normal. No respiratory distress.  Abdominal: Soft. There is no tenderness. There is no rebound and no guarding.  Genitourinary:  Black stool  Musculoskeletal: He exhibits no edema or tenderness.  Neurological: He is alert and oriented to person, place, and time.  Generalized weakness  Skin: Skin is warm and dry.  Psychiatric:  Flat affect  Nursing note and vitals  reviewed.   ED Course  Procedures (including critical care time)  CRITICAL CARE Performed by: Tilden Fossa   Total critical care time: 30 minutes  Critical care time was exclusive of separately billable procedures and treating other patients.  Critical care was necessary to treat or prevent imminent or life-threatening deterioration.  Critical care was time spent personally by me on the following activities: development of treatment plan with patient and/or surrogate as well as nursing, discussions with consultants, evaluation of patient's response to treatment, examination of patient, obtaining history from patient or surrogate, ordering and performing treatments and interventions, ordering and review of laboratory studies, ordering and review of radiographic studies, pulse oximetry and re-evaluation of patient's condition.   Angiocath insertion Performed by: Tilden Fossa  Consent: Verbal consent obtained. Risks and benefits: risks, benefits and alternatives were discussed Time out: Immediately prior to procedure a "time out" was called to verify the correct patient, procedure, equipment, support staff and site/side marked as required.  Preparation: Patient was prepped and draped in the usual sterile fashion.  Vein Location: right AC  Ultrasound Guided  Gauge: 20  Normal blood return and flush without difficulty Patient tolerance: Patient tolerated the procedure well with no immediate complications.     Labs Review Labs Reviewed  COMPREHENSIVE METABOLIC PANEL - Abnormal; Notable for the following:    Glucose, Bld 154 (*)    BUN 43 (*)    Creatinine, Ser 1.52 (*)    Calcium 7.2 (*)    Total Protein 3.5 (*)    Albumin 1.9 (*)    Alkaline Phosphatase 26 (*)    GFR calc non Af Amer 50 (*)    GFR calc Af Amer 58 (*)    Anion gap 4 (*)    All other components within normal limits  CBC WITH DIFFERENTIAL/PLATELET - Abnormal; Notable for the following:    WBC 14.8  (*)    RBC 2.16 (*)    Hemoglobin 6.0 (*)    HCT 18.0 (*)    Platelets 143 (*)    Neutrophils Relative % 78 (*)    Neutro Abs 11.5 (*)    All other components within normal limits  MRSA PCR SCREENING  TROPONIN I  GLUCOSE, CAPILLARY  URINALYSIS, ROUTINE W REFLEX MICROSCOPIC  PROTIME-INR  CBC  BASIC METABOLIC PANEL  MAGNESIUM  PHOSPHORUS  I-STAT CG4 LACTIC ACID, ED  I-STAT CHEM 8, ED  POC OCCULT BLOOD, ED  PREPARE RBC (CROSSMATCH)  TYPE AND SCREEN  PREPARE RBC (CROSSMATCH)    Imaging Review Dg Chest Port 1 View  10/05/2014   CLINICAL DATA:  Hypotension and near syncope  EXAM: PORTABLE CHEST - 1 VIEW  COMPARISON:  05/27/2013  FINDINGS: Left apical pleural-parenchymal scarring is unchanged. Remaining lungs are clear. Negative for pneumonia or heart failure.  IMPRESSION: No active disease.   Electronically Signed   By: Marlan Palau M.D.   On: 10/05/2014 20:40     EKG Interpretation   Date/Time:  Sunday October 05 2014 19:53:05 EST Ventricular Rate:  98 PR Interval:  127 QRS Duration: 90 QT Interval:  412 QTC Calculation: 526 R Axis:   94 Text Interpretation:  Sinus rhythm Borderline right axis deviation Minimal  ST depression, diffuse leads Prolonged QT interval Confirmed by Lincoln Brighamees, Liz  [phone]([id number]) on 10/05/2014 8:19:28 PM      MDM   Final diagnoses:  Acute upper GI bleed  Hemorrhagic shock    Patient here for evaluation of syncope and feeling poorly. Patient with upper GI bleed. Discussed with patient critical nature of his illness and patient does agree to blood transfusion after discussing the risks and benefits. Blood pressure would transiently improve with IV fluids and then dropped again. Discussed with gastroenterologist Dr. Marina GoodellPerry regarding patient, recommends PPI, blood transfusion, medical stabilization and will see the patient in consult. Discussed the patient with the intensivist who agrees to admit the patient. Patient did have endoscopy in 2014 that showed no  evidence of esophageal varices.  Tilden FossaElizabeth Selmer Adduci, MD 10/06/14 [phone]

## 2014-10-05 NOTE — ED Notes (Signed)
CBG Taken = 138 

## 2014-10-05 NOTE — ED Notes (Signed)
Pt been sick for the last 3 days, feeling dizzy near syncope, pt very orthostatic on EMS arrival and diaphoretic. Pt states he is been having diarrhea for the last 3 days, no fever, no CP, with BP 90/40 HR on the 110.

## 2014-10-05 NOTE — H&P (Signed)
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 He also did not allow this provider to contact family members for collateral history. Most of the history obtained from the chart. [name] is a 54 y/o man with a history of polysubstance abuse including EtOH, tobacco, prior cocaine, as well as multiple past admissions for GI bleeds. In 2014 he was admitted with significant upper GI bleed due to a duodenal ulcer with exposed vessel - he had had bleeding from this site in the past, with embolization attempts, epi injections, and clips. He was offered surgery for resection, but declined. He did have some behavioral issues complicating prior hospitalizations, including leaving AMA. He continues to drink alcohol, but apparently has not been taking NSAIDs.  PAST MEDICAL HISTORY :  Past Medical History  Diagnosis Date  . Abdominal pain, other specified site   . GERD (gastroesophageal reflux disease)   . Abnormal CT scan, esophagus 03/2012  . Substance abuse 03/2012    tox screen positive for cocaine.    Past Surgical History  Procedure Laterality Date  . Undescended testicle      on right. noted on 03/2012 CT scan.  no surgery referral.   . Esophagogastroduodenoscopy  08/10/2012    Procedure: ESOPHAGOGASTRODUODENOSCOPY (EGD);  Surgeon: [name], MD;  Location: [facility] ENDOSCOPY;  Service: Endoscopy;  Laterality: N/A;  will be  done in room 1   . Laparotomy  08/10/2012    Procedure: EXPLORATORY LAPAROTOMY;  Surgeon: [name], MD;  Location: [facility] OR;  Service: General;  Laterality: N/A;  . Esophagogastroduodenoscopy Left 05/01/2013    Procedure: ESOPHAGOGASTRODUODENOSCOPY (EGD);  Surgeon: [name], MD;  Location: [facility] ENDOSCOPY;  Service: Endoscopy;  Laterality: Left;  . Esophagogastroduodenoscopy N/A 05/11/2013    Procedure: ESOPHAGOGASTRODUODENOSCOPY (EGD);  Surgeon: [name], MD;  Location: [facility] ENDOSCOPY;  Service: Endoscopy;  Laterality: N/A;   Prior to Admission medications   Medication Sig Start Date End Date Taking? Authorizing Provider  acetaminophen (TYLENOL) 500 MG tablet Take 1,500 mg by mouth every 6 (six) hours as needed for mild pain.    Historical Provider, MD  HYDROcodone-acetaminophen (NORCO/VICODIN) 5-325 MG per tablet Take 1 tablet by mouth every 4 (four) hours as needed for moderate pain. 02/24/14   Lyanne Co, MD   Allergies  Allergen Reactions  . Chocolate Nausea And Vomiting  . Peanut-Containing Drug Products Nausea And Vomiting  . Spinach Nausea And Vomiting    FAMILY HISTORY:  History reviewed. No pertinent family history. SOCIAL HISTORY:  reports that he has been smoking.  He has never used smokeless tobacco. He reports that he drinks alcohol. He reports that he uses illicit drugs (Marijuana).  REVIEW OF SYSTEMS:  Pt reports plastic taste in his mouth  SUBJECTIVE:   VITAL SIGNS: Temp:  [97.9 F (36.6 C)] 97.9 F (36.6 C) (03/06 1945) Pulse Rate:  [85-99] 85 (03/06 2145) Resp:  [13-30] 17 (  03/06 2145) BP: (50-111)/(28-89) 101/44 mmHg (03/06 2145) SpO2:  [100 %] 100 % (03/06 2145) Weight:  [160 lb (72.576 kg)] 160 lb (72.576 kg) (03/06 1945) HEMODYNAMICS:   VENTILATOR SETTINGS:   INTAKE / OUTPUT: Intake/Output    None     PHYSICAL EXAMINATION: General:  Pale AAM in mild distress. Neuro:  Sleepy, but arousable. Would not participate in orientation  questions. HEENT:  Dry MM Neck: No JVD. Cardiovascular:  Heart sounds dual and normal. Lungs:  CTAB. Abdomen:  Soft, non-tender. Musculoskeletal:  Mild edema Skin:  No rashes.  LABS:  CBC No results for input(s): WBC, HGB, HCT, PLT in the last 168 hours. Coag's No results for input(s): APTT, INR in the last 168 hours. BMET No results for input(s): NA, K, CL, CO2, BUN, CREATININE, GLUCOSE in the last 168 hours. Electrolytes No results for input(s): CALCIUM, MG, PHOS in the last 168 hours. Sepsis Markers No results for input(s): LATICACIDVEN, PROCALCITON, O2SATVEN in the last 168 hours. ABG No results for input(s): PHART, PCO2ART, PO2ART in the last 168 hours. Liver Enzymes No results for input(s): AST, ALT, ALKPHOS, BILITOT, ALBUMIN in the last 168 hours. Cardiac Enzymes No results for input(s): TROPONINI, PROBNP in the last 168 hours. Glucose No results for input(s): GLUCAP in the last 168 hours.  Imaging Dg Chest Port 1 View  10/05/2014   CLINICAL DATA:  Hypotension and near syncope  EXAM: PORTABLE CHEST - 1 VIEW  COMPARISON:  05/27/2013  FINDINGS: Left apical pleural-parenchymal scarring is unchanged. Remaining lungs are clear. Negative for pneumonia or heart failure.  IMPRESSION: No active disease.   Electronically Signed   By: Marlan Palauharles  Clark M.D.   On: 10/05/2014 20:40    EKG: NSR CXR: None  ASSESSMENT / PLAN:  Active Problems:   GI bleed   PULMONARY A: Tobacco abuse P:   Not addressed  CARDIOVASCULAR A: Hypovolemic hypotension P:   S/p 3L, getting 2 U PRBCs  RENAL A: Labs pending P:   Suspect AKI due to hypovolemia. Suspect mild uremia.  GASTROINTESTINAL A: Acute upper GI bleed, favor recurrent duodenal ulcer P:   PPI gtt. IV Access is minimal, but patient will not allow for larger bore access. If needed, can place emergent I/O line. Appreciate GI input; will keep NPO for probable scope tomorrow.  HEMATOLOGIC A: Acute anemia P:   Plan for 2  units to transfuse now. Will monitor CBC with 2:00 draw.  INFECTIOUS A: No issues P:    ENDOCRINE A: EtOH Abuse P:   Thiamine/folate ordered  NEUROLOGIC A: EtOH Abuse, withdrawal potential. P:   Placed on CIWA protocol.  BEST PRACTICE / DISPOSITION Level of Care:  ICU Primary Service:  PCCM Consultants:  GI Code Status:  Full Diet:  NPO DVT Px:  Non-pharmacologic due to GIB. GI Px:  PPI gtt Skin Integrity:  Intact Social / Family:  Patient will not allow updates at this time.  TODAY'S SUMMARY: 54 y/o man with hx of EtOH admitted with UGIB  I have personally obtained a history, examined the patient, evaluated laboratory and imaging results, formulated the assessment and plan and placed orders.  CRITICAL CARE: The patient is critically ill with multiple organ systems failure and requires high complexity decision making for assessment and support, frequent evaluation and titration of therapies, application of advanced monitoring technologies and extensive interpretation of multiple databases. Critical Care Time devoted to patient care services described in this note is 40 minutes.   Jamie KatoAaron Psalm Schappell, MD Pulmonary and  Critical Care Medicine [facility] Pager: [phone]   10/05/2014, 10:35 PM

## 2014-10-06 ENCOUNTER — Inpatient Hospital Stay (HOSPITAL_COMMUNITY): Payer: Medicaid Other | Admitting: Anesthesiology

## 2014-10-06 ENCOUNTER — Encounter (HOSPITAL_COMMUNITY): Payer: Self-pay | Admitting: Certified Registered"

## 2014-10-06 ENCOUNTER — Encounter (HOSPITAL_COMMUNITY)
Admission: EM | Discharge status: Against Medical Advice | Payer: Self-pay | Source: Home / Self Care | Attending: Pulmonary Disease

## 2014-10-06 DIAGNOSIS — K264 Chronic or unspecified duodenal ulcer with hemorrhage: Principal | ICD-10-CM

## 2014-10-06 DIAGNOSIS — F1010 Alcohol abuse, uncomplicated: Secondary | ICD-10-CM

## 2014-10-06 DIAGNOSIS — K922 Gastrointestinal hemorrhage, unspecified: Secondary | ICD-10-CM

## 2014-10-06 DIAGNOSIS — R579 Shock, unspecified: Secondary | ICD-10-CM

## 2014-10-06 DIAGNOSIS — D62 Acute posthemorrhagic anemia: Secondary | ICD-10-CM

## 2014-10-06 DIAGNOSIS — Z8711 Personal history of peptic ulcer disease: Secondary | ICD-10-CM

## 2014-10-06 DIAGNOSIS — K921 Melena: Secondary | ICD-10-CM

## 2014-10-06 HISTORY — PX: ESOPHAGOGASTRODUODENOSCOPY: SHX5428

## 2014-10-06 LAB — POCT I-STAT, CHEM 8
BUN: 42 mg/dL — ABNORMAL HIGH (ref 6–23)
CALCIUM ION: 1.12 mmol/L (ref 1.12–1.23)
CREATININE: 1.5 mg/dL — AB (ref 0.50–1.35)
Chloride: 102 mmol/L (ref 96–112)
GLUCOSE: 148 mg/dL — AB (ref 70–99)
HCT: 15 % — ABNORMAL LOW (ref 39.0–52.0)
HEMOGLOBIN: 5.1 g/dL — AB (ref 13.0–17.0)
POTASSIUM: 4 mmol/L (ref 3.5–5.1)
Sodium: 138 mmol/L (ref 135–145)
TCO2: 20 mmol/L (ref 0–100)

## 2014-10-06 LAB — CBC
HEMATOCRIT: 19.2 % — AB (ref 39.0–52.0)
HEMATOCRIT: 25 % — AB (ref 39.0–52.0)
HEMOGLOBIN: 6.6 g/dL — AB (ref 13.0–17.0)
HEMOGLOBIN: 8.7 g/dL — AB (ref 13.0–17.0)
MCH: 28.9 pg (ref 26.0–34.0)
MCH: 29.5 pg (ref 26.0–34.0)
MCHC: 34.4 g/dL (ref 30.0–36.0)
MCHC: 34.8 g/dL (ref 30.0–36.0)
MCV: 84.2 fL (ref 78.0–100.0)
MCV: 84.7 fL (ref 78.0–100.0)
Platelets: 113 10*3/uL — ABNORMAL LOW (ref 150–400)
Platelets: 128 10*3/uL — ABNORMAL LOW (ref 150–400)
RBC: 2.28 MIL/uL — ABNORMAL LOW (ref 4.22–5.81)
RBC: 2.95 MIL/uL — AB (ref 4.22–5.81)
RDW: 13.6 % (ref 11.5–15.5)
RDW: 13.5 % (ref 11.5–15.5)
WBC: 11.4 10*3/uL — ABNORMAL HIGH (ref 4.0–10.5)
WBC: 13.6 10*3/uL — ABNORMAL HIGH (ref 4.0–10.5)

## 2014-10-06 LAB — BASIC METABOLIC PANEL
ANION GAP: 3 — AB (ref 5–15)
BUN: 32 mg/dL — ABNORMAL HIGH (ref 6–23)
CO2: 23 mmol/L (ref 19–32)
Calcium: 6.9 mg/dL — ABNORMAL LOW (ref 8.4–10.5)
Chloride: 114 mmol/L — ABNORMAL HIGH (ref 96–112)
Creatinine, Ser: 1.12 mg/dL (ref 0.50–1.35)
GFR calc Af Amer: 84 mL/min — ABNORMAL LOW (ref >90)
GFR calc non Af Amer: 73 mL/min — ABNORMAL LOW (ref >90)
Glucose, Bld: 87 mg/dL (ref 70–99)
Potassium: 4.2 mmol/L (ref 3.5–5.1)
SODIUM: 140 mmol/L (ref 135–145)

## 2014-10-06 LAB — URINALYSIS, ROUTINE W REFLEX MICROSCOPIC
Bilirubin Urine: NEGATIVE
Glucose, UA: NEGATIVE mg/dL
HGB URINE DIPSTICK: NEGATIVE
KETONES UR: NEGATIVE mg/dL
Leukocytes, UA: NEGATIVE
NITRITE: NEGATIVE
PROTEIN: NEGATIVE mg/dL
Specific Gravity, Urine: 1.021 (ref 1.005–1.030)
UROBILINOGEN UA: 0.2 mg/dL (ref 0.0–1.0)
pH: 5 (ref 5.0–8.0)

## 2014-10-06 LAB — MRSA PCR SCREENING: MRSA by PCR: NEGATIVE

## 2014-10-06 LAB — MAGNESIUM: MAGNESIUM: 2 mg/dL (ref 1.5–2.5)

## 2014-10-06 LAB — CG4 I-STAT (LACTIC ACID): Lactic Acid, Venous: 2.91 mmol/L (ref 0.5–2.0)

## 2014-10-06 LAB — GLUCOSE, CAPILLARY
GLUCOSE-CAPILLARY: 99 mg/dL (ref 70–99)
Glucose-Capillary: 138 mg/dL — ABNORMAL HIGH (ref 70–99)

## 2014-10-06 LAB — PROTIME-INR
INR: 1.17 (ref 0.00–1.49)
PROTHROMBIN TIME: 15 s (ref 11.6–15.2)

## 2014-10-06 LAB — PREPARE RBC (CROSSMATCH)

## 2014-10-06 LAB — OCCULT BLOOD, POC DEVICE: FECAL OCCULT BLD: POSITIVE — AB

## 2014-10-06 LAB — PHOSPHORUS: Phosphorus: 2.9 mg/dL (ref 2.3–4.6)

## 2014-10-06 SURGERY — EGD (ESOPHAGOGASTRODUODENOSCOPY)
Anesthesia: Monitor Anesthesia Care

## 2014-10-06 MED ORDER — CHLORHEXIDINE GLUCONATE 0.12 % MT SOLN
15.0000 mL | Freq: Two times a day (BID) | OROMUCOSAL | Status: DC
  Administered 2014-10-06: 15 mL via OROMUCOSAL
  Filled 2014-10-06: qty 15

## 2014-10-06 MED ORDER — CETYLPYRIDINIUM CHLORIDE 0.05 % MT LIQD
7.0000 mL | Freq: Two times a day (BID) | OROMUCOSAL | Status: DC
  Administered 2014-10-06: 7 mL via OROMUCOSAL

## 2014-10-06 MED ORDER — PROPOFOL INFUSION 10 MG/ML OPTIME
INTRAVENOUS | Status: DC | PRN
  Administered 2014-10-06: 175 ug/kg/min via INTRAVENOUS

## 2014-10-06 MED ORDER — LACTATED RINGERS IV SOLN
INTRAVENOUS | Status: DC | PRN
  Administered 2014-10-06: 14:00:00 via INTRAVENOUS

## 2014-10-06 MED ORDER — BUTAMBEN-TETRACAINE-BENZOCAINE 2-2-14 % EX AERO
INHALATION_SPRAY | CUTANEOUS | Status: DC | PRN
  Administered 2014-10-06: 2 via TOPICAL

## 2014-10-06 MED ORDER — KCL IN DEXTROSE-NACL 20-5-0.45 MEQ/L-%-% IV SOLN
INTRAVENOUS | Status: DC
  Administered 2014-10-06 (×2): via INTRAVENOUS
  Filled 2014-10-06 (×3): qty 1000

## 2014-10-06 MED ORDER — SODIUM CHLORIDE 0.9 % IV SOLN
Freq: Once | INTRAVENOUS | Status: DC
Start: 2014-10-06 — End: 2014-10-06

## 2014-10-06 NOTE — Op Note (Signed)
[name] [facility] [address] [place], [id number]   ENDOSCOPY PROCEDURE REPORT  PATIENT: Ryan GradMcCain, [name]  MR#: [id number] BIRTHDATE: 03/14/1961 , 54  yrs. old GENDER: male ENDOSCOPIST: Beverley FiedlerJay M Jerzey Komperda, MD REFERRED BY:  [facility] PROCEDURE DATE:  10/06/2014 PROCEDURE:  EGD, diagnostic ASA CLASS:     Class III INDICATIONS:  acute post hemorrhagic anemia and melena. MEDICATIONS: Monitored anesthesia care and Per Anesthesia TOPICAL ANESTHETIC: none  DESCRIPTION OF PROCEDURE: After the risks benefits and alternatives of the procedure were thoroughly explained, informed consent was obtained.  The PENTAX GASTOROSCOPE [id number] endoscope was introduced through the mouth and advanced to the second portion of the duodenum , Without limitations.  The instrument was slowly withdrawn as the mucosa was fully examined.   ESOPHAGUS: The mucosa of the esophagus appeared normal.   No varices.  STOMACH: The mucosa of the stomach appeared normal.  DUODENUM: A large non-bleeding and deep ulcer with a visible vessel was found in the duodenal bulb.  Due to the lack of bleeding now, the size and location of this ulcer it is not felt to be amenable to endoscopic intervention.  There is concern that any attempt at intervention today (in the absence of active bleeding) may cause rebleeding which would be difficult if not impossible to control (also prior endoscopic intervention was unsuccessful). Retroflexed views revealed no abnormalities.     The scope was then withdrawn from the patient and the procedure completed.  COMPLICATIONS: There were no immediate complications.  ENDOSCOPIC IMPRESSION: 1.   The mucosa of the esophagus appeared normal 2.   The mucosa of the stomach appeared normal 3.   Large ulcer with visible vessel was found in the duodenal bulb (see above)  RECOMMENDATIONS: 1.  Continue PPI drip for another 24 hours, then BID PPI for at least 12 weeks.   Closely monitoring for rebleeding (recurrent melena, hemodynamic instability, hematemesis, drop in Hgb).  If this occurs would recommend IR for embolization versus surgical consultation. 2.  No NSAIDs 3.  Check H Pylori status and treat if positive  eSigned:  Beverley FiedlerJay M Chaise Mahabir, MD 10/06/2014 2:06 PM     CC: the patient

## 2014-10-06 NOTE — Progress Notes (Signed)
CRITICAL VALUE ALERT  Critical value received:  Hgb 6.0  Date of notification:  10/05/14  Time of notification:  2339  Critical value read back: Yes  Nurse who received alert:  David StallGregory Hardak RN   MD notified (1st page):  MD Sharol HarnessSimmons   Time of first page:  0000  MD notified (2nd page):  Time of second page:  Responding MD:  MD Sharol HarnessSimmons   Time MD responded:  0001

## 2014-10-06 NOTE — Progress Notes (Signed)
Patient states he wants to go home and he needs to work. Patients caregiver Marcelino DusterMichelle called to speak with patient and was able to calm him down. Patient states he has not been updated on his status today. Called to Dr. Craige CottaSood who says he spoke at length with patient earlier. Also, MD from surgery at bedside to discuss with patient his options and current status. Patient with no needs at this time.   Hadia Minier R

## 2014-10-06 NOTE — Anesthesia Postprocedure Evaluation (Signed)
  Anesthesia Post-op Note  Patient: [name]  Procedure(s) Performed: Procedure(s) (LRB): ESOPHAGOGASTRODUODENOSCOPY (EGD) (N/A)  Patient Location: PACU  Anesthesia Type: MAC  Level of Consciousness: awake and alert   Airway and Oxygen Therapy: Patient Spontanous Breathing  Post-op Pain: mild  Post-op Assessment: Post-op Vital signs reviewed, Patient's Cardiovascular Status Stable, Respiratory Function Stable, Patent Airway and No signs of Nausea or vomiting  Last Vitals:  Filed Vitals:   10/06/14 1355  BP:   Pulse:   Temp:   Resp: 18    Post-op Vital Signs: stable   Complications: No apparent anesthesia complications

## 2014-10-06 NOTE — Anesthesia Preprocedure Evaluation (Addendum)
Anesthesia Evaluation  Patient identified by MRN, date of birth, ID band Patient awake    Reviewed: Allergy & Precautions, NPO status , Patient's Chart, lab work & pertinent test results  Airway Mallampati: II  TM Distance: >3 FB Neck ROM: Full    Dental no notable dental hx. (+) Poor Dentition, Dental Advisory Given   Pulmonary Current Smoker,  breath sounds clear to auscultation  Pulmonary exam normal       Cardiovascular hypertension, Pt. on medications Rhythm:Regular Rate:Normal     Neuro/Psych negative neurological ROS  negative psych ROS   GI/Hepatic GERD-  Medicated,(+)     substance abuse  cocaine use,   Endo/Other  negative endocrine ROS  Renal/GU negative Renal ROS  negative genitourinary   Musculoskeletal negative musculoskeletal ROS (+)   Abdominal   Peds negative pediatric ROS (+)  Hematology  (+) anemia ,   Anesthesia Other Findings   Reproductive/Obstetrics negative OB ROS                           Anesthesia Physical Anesthesia Plan  ASA: III  Anesthesia Plan: MAC   Post-op Pain Management:    Induction: Intravenous  Airway Management Planned: Nasal Cannula  Additional Equipment:   Intra-op Plan:   Post-operative Plan:   Informed Consent: I have reviewed the patients History and Physical, chart, labs and discussed the procedure including the risks, benefits and alternatives for the proposed anesthesia with the patient or authorized representative who has indicated his/her understanding and acceptance.   Dental advisory given  Plan Discussed with: CRNA and Surgeon  Anesthesia Plan Comments:         Anesthesia Quick Evaluation

## 2014-10-06 NOTE — Progress Notes (Signed)
UR Completed.  [phone]  

## 2014-10-06 NOTE — Progress Notes (Signed)
eLink Physician-Brief Progress Note Patient Name: Einar GradRandy Zimbelman DOB: 05/31/1961 MRN: [id number]   Date of Service  10/06/2014  HPI/Events of Note  Hgb 6.6  eICU Interventions  One unit PRBCs     Intervention Category Intermediate Interventions: Bleeding - evaluation and treatment with blood products  Billy FischerDavid Calie Buttrey 10/06/2014, 6:34 AM

## 2014-10-06 NOTE — Progress Notes (Signed)
PULMONARY / CRITICAL CARE MEDICINE HISTORY AND PHYSICAL EXAMINATION   Name: [name] MRN: [id number] DOB: 04/06/1961    ADMISSION DATE:  10/05/2014  PRIMARY SERVICE: PCCM  CHIEF COMPLAINT:  Lightheadedness  BRIEF PATIENT DESCRIPTION: 54 y/o man with hx of EtOH abuse as well as recurrent UGIB due to ulcers who presents with melena, near-syncope  SIGNIFICANT EVENTS: 3/06 Admit, GI consulted  STUDIES:  SUBJECTIVE:  Wants to eat.  VITAL SIGNS: Temp:  [97.8 F (36.6 C)-99.3 F (37.4 C)] 99.3 F (37.4 C) (03/07 0708) Pulse Rate:  [79-99] 82 (03/07 0708) Resp:  [0-30] 20 (03/07 0708) BP: (50-115)/(28-89) 108/48 mmHg (03/07 0708) SpO2:  [97 %-100 %] 99 % (03/07 0708) Weight:  [160 lb (72.576 kg)] 160 lb (72.576 kg) (03/06 1945) INTAKE / OUTPUT: Intake/Output      03/06 0701 - 03/07 0700 03/07 0701 - 03/08 0700   I.V. (mL/kg) 697.5 (9.6)    Blood 788    Total Intake(mL/kg) 1485.5 (20.5)    Urine (mL/kg/hr) 1150 275 (1.4)   Total Output 1150 275   Net +335.5 -275          PHYSICAL EXAMINATION: General:  No distress Neuro: normal strength HEENT:  No sinus tenderness Cardiovascular: regular Lungs: no wheeze Abdomen:  Soft, non-tender. Musculoskeletal: no edema Skin:  No rashes.  LABS:  CBC  Recent Labs Lab 10/05/14 2004 10/06/14 0500  WBC 14.8* 11.4*  HGB 6.0* 6.6*  HCT 18.0* 19.2*  PLT 143* 113*   Coag's  Recent Labs Lab 10/06/14 0651  INR 1.17   BMET  Recent Labs Lab 10/05/14 2004 10/06/14 0500  NA 138 140  K 4.1 4.2  CL 107 114*  CO2 27 23  BUN 43* 32*  CREATININE 1.52* 1.12  GLUCOSE 154* 87   Electrolytes  Recent Labs Lab 10/05/14 2004 10/06/14 0500  CALCIUM 7.2* 6.9*  MG  --  2.0  PHOS  --  2.9   Liver Enzymes  Recent Labs Lab 10/05/14 2004  AST 18  ALT 11  ALKPHOS 26*  BILITOT 0.4  ALBUMIN 1.9*   Cardiac Enzymes  Recent Labs Lab 10/05/14 2004  TROPONINI <0.03   Glucose  Recent Labs Lab 10/06/14 0025   GLUCAP 99    Imaging Dg Chest Port 1 View  10/05/2014   CLINICAL DATA:  Hypotension and near syncope  EXAM: PORTABLE CHEST - 1 VIEW  COMPARISON:  05/27/2013  FINDINGS: Left apical pleural-parenchymal scarring is unchanged. Remaining lungs are clear. Negative for pneumonia or heart failure.  IMPRESSION: No active disease.   Electronically Signed   By: Marlan Palauharles  Clark M.D.   On: 10/05/2014 20:40    ASSESSMENT / PLAN:  PULMONARY A: Tobacco abuse P:   Smoking cessation  CARDIOVASCULAR A: Hemorrhagic shock from GI bleed >> imrpoved. P:   Monitor hemodynamics  RENAL A: Mild hyperchloremia >> from NS IV fluid. AKI  >> improved. P:   F/u BMET  GASTROINTESTINAL A: Acute upper GI bleed. P:   Continue protonix gtt GI to assess NPO  HEMATOLOGIC A: Acute blood loss anemia. P:   F/u CBC SCD  INFECTIOUS A: No issues. P:   Monitor clinically  ENDOCRINE A: No acute issues. P:   Monitor blood sugar on BMET  NEUROLOGIC A: EtOH Abuse, withdrawal potential. P:   Placed on CIWA protocol. Thiamine, folic acid  CC time 35 minutes.  Coralyn HellingVineet Daymon Hora, MD [facility] 10/06/2014, 9:48 AM Pager:  [phone] After 3pm call: [phone]

## 2014-10-06 NOTE — Progress Notes (Signed)
Called Elink Nurse Marisue IvanLiz to verify wanting only 2 units of PRBCs. Will continue to monitor and assess.

## 2014-10-06 NOTE — Transfer of Care (Signed)
Immediate Anesthesia Transfer of Care Note  Patient: [name]  Procedure(s) Performed: Procedure(s): ESOPHAGOGASTRODUODENOSCOPY (EGD) (N/A)  Patient Location: Endoscopy Unit  Anesthesia Type:MAC  Level of Consciousness: awake, alert  and oriented  Airway & Oxygen Therapy: Patient Spontanous Breathing and Patient connected to nasal cannula oxygen  Post-op Assessment: Report given to RN, Post -op Vital signs reviewed and stable and Patient moving all extremities X 4  Post vital signs: Reviewed and stable  Last Vitals:  Filed Vitals:   10/06/14 1242  BP: 131/62  Pulse: 75  Temp: 36.6 C  Resp: 16    Complications: No apparent anesthesia complications

## 2014-10-06 NOTE — Consult Note (Signed)
[facility] Consult: 9:05 AM 10/06/2014  LOS: 1 day    Referring Provider: Dr Craige CottaSood  Primary Care Physician:  Jackie PlumSEI-BONSU,GEORGE, MD Primary Gastroenterologist:  Gentry FitzUnassigned has seen all the GI MD groups in [place] over last 2 years.      Reason for Consultation:  Dark stool, anemia.    HPI: [name] is a 54 y.o. male.  Alcoholic, substance abuser with hx DTs and un specified psychiatric disorder. Ongoing ETOH abuse.  Not taking PPI or any other GI protective meds.    Hx bleeding duodenal ulcer going back to 2014.  S/p 08/2012 EGD (Dr Christella HartiganJacobs) with endoclipping of ulcer, followed by ex lap with oversew of ulcer. GI bleed with 6 units blood transfusion but left AMA in 04/2013. Then on 05/01/2013 EGD (Dr Loreta AveMann) with epi injection of bleeding post bulbar ulcer with large VV, duodenitis and sutures noted in duodenal bulb,  S/p  IR embolization of gastro duodenal artery.  Bled again and s/p 05/11/13 EGD ( Dr Evette CristalGanem)  with oozing ulcer at duodenal bulb, injected with epi after 2 clips misfired. Pt refused surgical intervention.   Now readmitted with near-syncope a/w a single dark stool yesterday AM, BP to 88/54 in ED, hgb 6 and melena. Just a single episode of dark stool yesterday AM.  Received 3 units blood thus far.  Hgb to 6.6. Platelets dropped to 113 today, but previously normal.  No coags so far.  LFTs normal.  BUN up to 43.   Not taking PPI nor NSAIDs.  Daily malt liquor or beer.  Good appetite, no nausea, currently hungry. Stable in ICU   Past Medical History  Diagnosis Date  . Abdominal pain, other specified site   . GERD (gastroesophageal reflux disease)   . Abnormal CT scan, esophagus 03/2012  . Substance abuse 03/2012    tox screen positive for cocaine.     Past Surgical History  Procedure Laterality Date  .  Undescended testicle      on right. noted on 03/2012 CT scan.  no surgery referral.   . Esophagogastroduodenoscopy  08/10/2012    Procedure: ESOPHAGOGASTRODUODENOSCOPY (EGD);  Surgeon: Rachael Feeaniel P Jacobs, MD;  [facility];  Service: Endoscopy;  Laterality: N/A;  will be done in room 1   . Laparotomy  08/10/2012    Procedure: EXPLORATORY LAPAROTOMY;  Surgeon: Cherylynn RidgesJames O Wyatt, MD;  [facility];  Service: General;  Laterality: N/A;  . Esophagogastroduodenoscopy Left 05/01/2013    Procedure: ESOPHAGOGASTRODUODENOSCOPY (EGD);  Surgeon: Charna ElizabethJyothi Mann, MD;  Location: [facility] ENDOSCOPY;  Service: Endoscopy;  Laterality: Left;  . Esophagogastroduodenoscopy N/A 05/11/2013    Procedure: ESOPHAGOGASTRODUODENOSCOPY (EGD);  Surgeon: Graylin ShiverSalem F Ganem, MD;  [facility];  Service: Endoscopy;  Laterality: N/A;    Prior to Admission medications   Medication Sig Start Date End Date Taking? Authorizing Provider  acetaminophen (TYLENOL) 500 MG tablet Take 1,500 mg by mouth every 6 (six) hours as needed for mild pain.    Historical Provider, MD  HYDROcodone-acetaminophen (NORCO/VICODIN) 5-325 MG per tablet Take 1 tablet by mouth every  4 (four) hours as needed for moderate pain. 02/24/14   Lyanne Co, MD    Scheduled Meds: . sodium chloride  10 mL/hr Intravenous Once  . sodium chloride   Intravenous Once  . folic acid  1 mg Intravenous Daily  . [START ON 10/09/2014] pantoprazole (PROTONIX) IV  40 mg Intravenous Q12H  . thiamine  100 mg Intravenous Daily   Infusions: . pantoprozole (PROTONIX) infusion 8 mg/hr (10/05/14 2325)   PRN Meds: sodium chloride, diazepam   Allergies as of 10/05/2014 - Review Complete 10/05/2014  Allergen Reaction Noted  . Chocolate Nausea And Vomiting 04/08/2013  . Peanut-containing drug products Nausea And Vomiting 03/16/2013  . Spinach Nausea And Vomiting 03/16/2013    History reviewed. No pertinent family history.  History   Social History  . Marital Status:  Single    Spouse Name: N/A  . Number of Children: N/A  . Years of Education: N/A   Occupational History  . Not on file.   Social History Main Topics  . Smoking status: Light Tobacco Smoker -- 1.00 packs/day for 34 years  . Smokeless tobacco: Never Used  . Alcohol Use: Yes     Comment: 40oz beer/day  . Drug Use: Yes    Special: Marijuana     Comment: occ - "$5 bag" "only when I got money"  . Sexual Activity: Not on file   Other Topics Concern  . Not on file   Social History Narrative    REVIEW OF SYSTEMS: Constitutional:  Stable weight, no fatigue ENT:  No nose bleeds Pulm:  No SOB or cough CV:  No palpitations, no LE edema.   GU:  No hematuria, no frequency GI:  Per HPI.  No dysphagia, no abd pain Heme:  Per HPI   Transfusions:  Per HPI Neuro:  No headaches, no peripheral tingling or numbness Derm:  No itching, no rash or sores.  Endocrine:  No sweats or chills.  No polyuria or dysuria Immunization:  None in Epic.  No flu shot for this current year.  Travel:  None beyond local counties in last few months.    PHYSICAL EXAM: Vital signs in last 24 hours: Filed Vitals:   10/06/14 0708  BP: 108/48  Pulse: 82  Temp: 99.3 F (37.4 C)  Resp: 20   Wt Readings from Last 3 Encounters:  10/05/14 160 lb (72.576 kg)  05/27/13 160 lb (72.576 kg)  05/11/13 173 lb 15.1 oz (78.9 kg)   General: pt does not look ill.  Comfortable and alert Head:  No  Swelling or assymettry.   Eyes:  No icterus or pallor Ears:  Not HOH  Nose:  No congestion or discharge Mouth:  Only 2 lower bicuspids remain.   Neck:  No mass or JVD Lungs:  Clear bil. Heart: RRR.  No mrg. s1 s2 audible Abdomen:  Soft, NT, no mass.  Active BS.  No HSM.   Rectal: deferred   Musc/Skeltl: no joint swellling or deformity Extremities:  No CCE  Neurologic:  Oriented x 3, no tremor, no asterixis.  Moves all 4 limbs Skin:  No rash or sores Nodes: no cervical adenopathy.    Psych:  Pleasant, cooperative.    Intake/Output from previous day: 03/06 0701 - 03/07 0700 In: 1485.5 [I.V.:697.5; Blood:788] Out: 1150 [Urine:1150] Intake/Output this shift: Total I/O In: -  Out: 275 [Urine:275]  LAB RESULTS:  Recent Labs  10/05/14 2004 10/06/14 0500  WBC 14.8* 11.4*  HGB 6.0* 6.6*  HCT 18.0*  19.2*  PLT 143* 113*   BMET Lab Results  Component Value Date   NA 140 10/06/2014   NA 138 10/05/2014   NA 139 02/24/2014   K 4.2 10/06/2014   K 4.1 10/05/2014   K 4.1 02/24/2014   CL 114* 10/06/2014   CL 107 10/05/2014   CL 101 02/24/2014   CO2 23 10/06/2014   CO2 27 10/05/2014   CO2 28 02/24/2014   GLUCOSE 87 10/06/2014   GLUCOSE 154* 10/05/2014   GLUCOSE 94 02/24/2014   BUN 32* 10/06/2014   BUN 43* 10/05/2014   BUN 10 02/24/2014   CREATININE 1.12 10/06/2014   CREATININE 1.52* 10/05/2014   CREATININE 1.22 02/24/2014   CALCIUM 6.9* 10/06/2014   CALCIUM 7.2* 10/05/2014   CALCIUM 9.1 02/24/2014   LFT  Recent Labs  10/05/14 2004  PROT 3.5*  ALBUMIN 1.9*  AST 18  ALT 11  ALKPHOS 26*  BILITOT 0.4   PT/INR Lab Results  Component Value Date   INR 1.17 10/06/2014   INR 0.96 05/27/2013   INR 1.04 05/14/2013    RADIOLOGY STUDIES: Dg Chest Port 1 View  10/05/2014   CLINICAL DATA:  Hypotension and near syncope  EXAM: PORTABLE CHEST - 1 VIEW  COMPARISON:  05/27/2013  FINDINGS: Left apical pleural-parenchymal scarring is unchanged. Remaining lungs are clear. Negative for pneumonia or heart failure.  IMPRESSION: No active disease.   Electronically Signed   By: Marlan Palau M.D.   On: 10/05/2014 20:40    ENDOSCOPIC STUDIES: Per HPI  IMPRESSION:   *  Recurrent GI bleeding.  Presume recurrent DU with bleed in pt who is not compliant with PPI and extensive DU/bleeding history  *  ABL anemia .   S/P PRBC x 3  *  Alcoholism.  No hx cirrhosis.     PLAN:     *  Trying to arrange EGD with Propofol for this afternoon.   *  Keep on PPI drip, follow Hgb.     Jennye Moccasin  10/06/2014, 9:05 AM Pager: [phone]

## 2014-10-06 NOTE — Consult Note (Signed)
HPI: [name] is a 53 year old male well known to our service presenting with an UGI bleed.  He was admitted 08/09/12-08/20/12 with a UGI and subsequently had an exploratory laparotomy, posterior duodenal oversaw by Dr. Hulen Skains.  He was readmitted in October of 2014 with a recurrent bleed at which time IR embolized GDA.  He bled again 2 weeks later, but did not require further intervention.  He reports feeling well until this past Sunday at which time he developed pre-syncopal symptoms and melena.  He denies EtOH, drug, NSAID or tobacco use to me, however, has a strong history of this.  He denies shortness of breath or chest pains.   Denies recent weight loss.  It was difficult to interview the patient.  He repeatedly asked me when he can eat and go home.   On admission hgb 6, dropped to 5.1.  He was given 2 units of PRBCs.  Hgb this Am 6.6.  An endoscopy showed a large ulcer in the duodenal bulb with a visible large vessel.  We have been asked to evaluate for surgery.    Past Medical History  Diagnosis Date  . Abdominal pain, other specified site   . GERD (gastroesophageal reflux disease)   . Abnormal CT scan, esophagus 03/2012  . Substance abuse 03/2012    tox screen positive for cocaine.     Past Surgical History  Procedure Laterality Date  . Undescended testicle      on right. noted on 03/2012 CT scan.  no surgery referral.   . Esophagogastroduodenoscopy  08/10/2012    Procedure: ESOPHAGOGASTRODUODENOSCOPY (EGD);  Surgeon: Milus Banister, MD;  Location: [place];  Service: Endoscopy;  Laterality: N/A;  will be done in room 1   . Laparotomy  08/10/2012    Procedure: EXPLORATORY LAPAROTOMY;  Surgeon: Gwenyth Ober, MD;  Location: [place];  Service: General;  Laterality: N/A;  . Esophagogastroduodenoscopy Left 05/01/2013    Procedure: ESOPHAGOGASTRODUODENOSCOPY (EGD);  Surgeon: Juanita Craver, MD;  Location: [facility];  Service: Endoscopy;  Laterality: Left;  . Esophagogastroduodenoscopy N/A  05/11/2013    Procedure: ESOPHAGOGASTRODUODENOSCOPY (EGD);  Surgeon: Wonda Horner, MD;  Location: [facility];  Service: Endoscopy;  Laterality: N/A;    History reviewed. No pertinent family history.  Social History:  reports that he has been smoking.  He has never used smokeless tobacco. He reports that he drinks alcohol. He reports that he uses illicit drugs (Marijuana).  Allergies:  Allergies  Allergen Reactions  . Chocolate Nausea And Vomiting  . Peanut-Containing Drug Products Nausea And Vomiting  . Spinach Nausea And Vomiting    Medications:  Scheduled Meds: . folic acid  1 mg Intravenous Daily  . [START ON 10/09/2014] pantoprazole (PROTONIX) IV  40 mg Intravenous Q12H  . thiamine  100 mg Intravenous Daily   Continuous Infusions: . dextrose 5 % and 0.45 % NaCl with KCl 20 mEq/L 75 mL/hr at 10/06/14 1121  . pantoprozole (PROTONIX) infusion 8 mg/hr (10/06/14 1120)   PRN Meds:.diazepam   Results for orders placed or performed during the hospital encounter of 10/05/14 (from the past 48 hour(s))  Glucose, capillary     Status: Abnormal   Collection Time: 10/05/14  7:54 PM  Result Value Ref Range   Glucose-Capillary 138 (H) 70 - 99 mg/dL  Comprehensive metabolic panel     Status: Abnormal   Collection Time: 10/05/14  8:04 PM  Result Value Ref Range   Sodium 138 135 - 145 mmol/L  Potassium 4.1 3.5 - 5.1 mmol/L   Chloride 107 96 - 112 mmol/L   CO2 27 19 - 32 mmol/L   Glucose, Bld 154 (H) 70 - 99 mg/dL   BUN 43 (H) 6 - 23 mg/dL   Creatinine, Ser 1.52 (H) 0.50 - 1.35 mg/dL   Calcium 7.2 (L) 8.4 - 10.5 mg/dL   Total Protein 3.5 (L) 6.0 - 8.3 g/dL   Albumin 1.9 (L) 3.5 - 5.2 g/dL   AST 18 0 - 37 U/L   ALT 11 0 - 53 U/L   Alkaline Phosphatase 26 (L) 39 - 117 U/L   Total Bilirubin 0.4 0.3 - 1.2 mg/dL   GFR calc non Af Amer 50 (L) >90 mL/min   GFR calc Af Amer 58 (L) >90 mL/min    Comment: (NOTE) The eGFR has been calculated using the CKD EPI equation. This calculation  has not been validated in all clinical situations. eGFR's persistently <90 mL/min signify possible Chronic Kidney Disease.    Anion gap 4 (L) 5 - 15  Troponin I     Status: None   Collection Time: 10/05/14  8:04 PM  Result Value Ref Range   Troponin I <0.03 <0.031 ng/mL    Comment:        NO INDICATION OF MYOCARDIAL INJURY.   CBC with Differential     Status: Abnormal   Collection Time: 10/05/14  8:04 PM  Result Value Ref Range   WBC 14.8 (H) 4.0 - 10.5 K/uL   RBC 2.16 (L) 4.22 - 5.81 MIL/uL   Hemoglobin 6.0 (LL) 13.0 - 17.0 g/dL    Comment: REPEATED TO VERIFY CRITICAL RESULT CALLED TO, READ BACK BY AND VERIFIED WITH: G.[facility] 2339 10/05/14 M.CAMPBELL    HCT 18.0 (L) 39.0 - 52.0 %   MCV 83.3 78.0 - 100.0 fL   MCH 27.8 26.0 - 34.0 pg   MCHC 33.3 30.0 - 36.0 g/dL   RDW 13.7 11.5 - 15.5 %   Platelets 143 (L) 150 - 400 K/uL   Neutrophils Relative % 78 (H) 43 - 77 %   Neutro Abs 11.5 (H) 1.7 - 7.7 K/uL   Lymphocytes Relative 16 12 - 46 %   Lymphs Abs 2.4 0.7 - 4.0 K/uL   Monocytes Relative 6 3 - 12 %   Monocytes Absolute 0.9 0.1 - 1.0 K/uL   Eosinophils Relative 0 0 - 5 %   Eosinophils Absolute 0.0 0.0 - 0.7 K/uL   Basophils Relative 0 0 - 1 %   Basophils Absolute 0.0 0.0 - 0.1 K/uL  I-STAT, chem 8     Status: Abnormal   Collection Time: 10/05/14  8:43 PM  Result Value Ref Range   Sodium 138 135 - 145 mmol/L   Potassium 4.0 3.5 - 5.1 mmol/L   Chloride 102 96 - 112 mmol/L   BUN 42 (H) 6 - 23 mg/dL   Creatinine, Ser 1.50 (H) 0.50 - 1.35 mg/dL   Glucose, Bld 148 (H) 70 - 99 mg/dL   Calcium, Ion 1.12 1.12 - 1.23 mmol/L   TCO2 20 0 - 100 mmol/L   Hemoglobin 5.1 (LL) 13.0 - 17.0 g/dL   HCT 15.0 (L) 39.0 - 52.0 %   Comment NOTIFIED PHYSICIAN   CG4 I-STAT (Lactic acid)     Status: Abnormal   Collection Time: 10/05/14  8:44 PM  Result Value Ref Range   Lactic Acid, Venous 2.91 (HH) 0.5 - 2.0 mmol/L   Comment NOTIFIED PHYSICIAN  Prepare RBC     Status: None    Collection Time: 10/05/14  8:50 PM  Result Value Ref Range   Order Confirmation ORDER PROCESSED BY BLOOD BANK   Occult blood, poc device     Status: Abnormal   Collection Time: 10/05/14  9:01 PM  Result Value Ref Range   Fecal Occult Bld POSITIVE (A) NEGATIVE  Type and screen     Status: None (Preliminary result)   Collection Time: 10/05/14  9:10 PM  Result Value Ref Range   ABO/RH(D) O NEG    Antibody Screen NEG    Sample Expiration 10/08/2014    Unit Number [id number]    Blood Component Type RBC CPDA1, LR    Unit division 00    Status of Unit ISSUED    Transfusion Status OK TO TRANSFUSE    Crossmatch Result COMPATIBLE    Unit Number [id number]    Blood Component Type RBC LR PHER1    Unit division 00    Status of Unit ALLOCATED    Transfusion Status OK TO TRANSFUSE    Crossmatch Result COMPATIBLE    Unit Number [id number]    Blood Component Type RBC LR PHER2    Unit division 00    Status of Unit ISSUED,FINAL    Transfusion Status OK TO TRANSFUSE    Crossmatch Result COMPATIBLE    Unit Number [id number]    Blood Component Type RBC LR PHER2    Unit division 00    Status of Unit ISSUED    Transfusion Status OK TO TRANSFUSE    Crossmatch Result COMPATIBLE   Prepare RBC     Status: None   Collection Time: 10/05/14 11:06 PM  Result Value Ref Range   Order Confirmation ORDER PROCESSED BY BLOOD BANK   MRSA PCR Screening     Status: None   Collection Time: 10/05/14 11:53 PM  Result Value Ref Range   MRSA by PCR NEGATIVE NEGATIVE    Comment:        The GeneXpert MRSA Assay (FDA approved for NASAL specimens only), is one component of a comprehensive MRSA colonization surveillance program. It is not intended to diagnose MRSA infection nor to guide or monitor treatment for MRSA infections.   Urinalysis, Routine w reflex microscopic     Status: Abnormal   Collection Time: 10/06/14 12:19 AM  Result Value Ref Range   Color, Urine YELLOW YELLOW   APPearance  CLOUDY (A) CLEAR   Specific Gravity, Urine 1.021 1.005 - 1.030   pH 5.0 5.0 - 8.0   Glucose, UA NEGATIVE NEGATIVE mg/dL   Hgb urine dipstick NEGATIVE NEGATIVE   Bilirubin Urine NEGATIVE NEGATIVE   Ketones, ur NEGATIVE NEGATIVE mg/dL   Protein, ur NEGATIVE NEGATIVE mg/dL   Urobilinogen, UA 0.2 0.0 - 1.0 mg/dL   Nitrite NEGATIVE NEGATIVE   Leukocytes, UA NEGATIVE NEGATIVE    Comment: MICROSCOPIC NOT DONE ON URINES WITH NEGATIVE PROTEIN, BLOOD, LEUKOCYTES, NITRITE, OR GLUCOSE <1000 mg/dL.  Glucose, capillary     Status: None   Collection Time: 10/06/14 12:25 AM  Result Value Ref Range   Glucose-Capillary 99 70 - 99 mg/dL  CBC     Status: Abnormal   Collection Time: 10/06/14  5:00 AM  Result Value Ref Range   WBC 11.4 (H) 4.0 - 10.5 K/uL   RBC 2.28 (L) 4.22 - 5.81 MIL/uL   Hemoglobin 6.6 (LL) 13.0 - 17.0 g/dL    Comment: REPEATED TO VERIFY CRITICAL VALUE NOTED.  VALUE IS  CONSISTENT WITH PREVIOUSLY REPORTED AND CALLED VALUE.    HCT 19.2 (L) 39.0 - 52.0 %   MCV 84.2 78.0 - 100.0 fL   MCH 28.9 26.0 - 34.0 pg   MCHC 34.4 30.0 - 36.0 g/dL   RDW 13.6 11.5 - 15.5 %   Platelets 113 (L) 150 - 400 K/uL    Comment: PLATELET COUNT CONFIRMED BY SMEAR  Basic metabolic panel     Status: Abnormal   Collection Time: 10/06/14  5:00 AM  Result Value Ref Range   Sodium 140 135 - 145 mmol/L   Potassium 4.2 3.5 - 5.1 mmol/L   Chloride 114 (H) 96 - 112 mmol/L   CO2 23 19 - 32 mmol/L   Glucose, Bld 87 70 - 99 mg/dL   BUN 32 (H) 6 - 23 mg/dL   Creatinine, Ser 1.12 0.50 - 1.35 mg/dL   Calcium 6.9 (L) 8.4 - 10.5 mg/dL   GFR calc non Af Amer 73 (L) >90 mL/min   GFR calc Af Amer 84 (L) >90 mL/min    Comment: (NOTE) The eGFR has been calculated using the CKD EPI equation. This calculation has not been validated in all clinical situations. eGFR's persistently <90 mL/min signify possible Chronic Kidney Disease.    Anion gap 3 (L) 5 - 15  Magnesium     Status: None   Collection Time: 10/06/14  5:00  AM  Result Value Ref Range   Magnesium 2.0 1.5 - 2.5 mg/dL  Phosphorus     Status: None   Collection Time: 10/06/14  5:00 AM  Result Value Ref Range   Phosphorus 2.9 2.3 - 4.6 mg/dL  Protime-INR     Status: None   Collection Time: 10/06/14  6:51 AM  Result Value Ref Range   Prothrombin Time 15.0 11.6 - 15.2 seconds   INR 1.17 0.00 - 1.49  Prepare RBC     Status: None   Collection Time: 10/06/14  7:00 AM  Result Value Ref Range   Order Confirmation BB SAMPLE OR UNITS ALREADY AVAILABLE     Dg Chest Port 1 View  10/05/2014   CLINICAL DATA:  Hypotension and near syncope  EXAM: PORTABLE CHEST - 1 VIEW  COMPARISON:  05/27/2013  FINDINGS: Left apical pleural-parenchymal scarring is unchanged. Remaining lungs are clear. Negative for pneumonia or heart failure.  IMPRESSION: No active disease.   Electronically Signed   By: Franchot Gallo M.D.   On: 10/05/2014 20:40    Review of Systems  All other systems reviewed and are negative.  Blood pressure 150/110, pulse 73, temperature 97.8 F (36.6 C), temperature source Oral, resp. rate 16, height $RemoveBe'5\' 9"'LENVcnXAR$  (1.753 m), weight 160 lb (72.576 kg), SpO2 100 %. Physical Exam  Constitutional: He is oriented to person, place, and time. He appears well-nourished. No distress.  Cardiovascular: Normal rate, regular rhythm, normal heart sounds and intact distal pulses.  Exam reveals no gallop and no friction rub.   No murmur heard. Respiratory: Effort normal and breath sounds normal. No respiratory distress. He has no wheezes. He has no rales. He exhibits no tenderness.  GI: Soft. Bowel sounds are normal. He exhibits no distension and no mass. There is no tenderness. There is no rebound and no guarding.  Musculoskeletal: Normal range of motion. He exhibits no edema or tenderness.  Neurological: He is alert and oriented to person, place, and time.  Skin: Skin is warm and dry. He is not diaphoretic.    Assessment/Plan: UGI bleeding Duodenal bulb  ulcer  He  would not be a good surgical candidate given his previous oversaw in January of 2014.  If the patient re-bleeds, we recommend embolization.  Would make IR aware of the patient.  He needs to stop smoking and drinking alcohol.  Would recommend transfusing him or at the very least repeating a CBC.  Thank you for the consult.    Jaimere Feutz ANP-BC 10/06/2014, 2:57 PM

## 2014-10-07 ENCOUNTER — Encounter (HOSPITAL_COMMUNITY): Payer: Self-pay | Admitting: Internal Medicine

## 2014-10-07 LAB — CBC
HCT: 18.7 % — ABNORMAL LOW (ref 39.0–52.0)
HCT: 19.1 % — ABNORMAL LOW (ref 39.0–52.0)
HEMATOCRIT: 20.7 % — AB (ref 39.0–52.0)
HEMOGLOBIN: 7.1 g/dL — AB (ref 13.0–17.0)
HEMOGLOBIN: 6.3 g/dL — AB (ref 13.0–17.0)
Hemoglobin: 6.7 g/dL — CL (ref 13.0–17.0)
MCH: 29 pg (ref 26.0–34.0)
MCH: 29.7 pg (ref 26.0–34.0)
MCH: 30.5 pg (ref 26.0–34.0)
MCHC: 34.3 g/dL (ref 30.0–36.0)
MCHC: 33.7 g/dL (ref 30.0–36.0)
MCHC: 35.1 g/dL (ref 30.0–36.0)
MCV: 84.5 fL (ref 78.0–100.0)
MCV: 88.2 fL (ref 78.0–100.0)
MCV: 86.8 fL (ref 78.0–100.0)
PLATELETS: 111 10*3/uL — AB (ref 150–400)
Platelets: 122 10*3/uL — ABNORMAL LOW (ref 150–400)
Platelets: 128 10*3/uL — ABNORMAL LOW (ref 150–400)
RBC: 2.45 MIL/uL — AB (ref 4.22–5.81)
RBC: 2.12 MIL/uL — ABNORMAL LOW (ref 4.22–5.81)
RBC: 2.2 MIL/uL — AB (ref 4.22–5.81)
RDW: 13.6 % (ref 11.5–15.5)
RDW: 13.8 % (ref 11.5–15.5)
RDW: 14.8 % (ref 11.5–15.5)
WBC: 10.2 10*3/uL (ref 4.0–10.5)
WBC: 12 10*3/uL — ABNORMAL HIGH (ref 4.0–10.5)
WBC: 13.6 10*3/uL — ABNORMAL HIGH (ref 4.0–10.5)

## 2014-10-07 LAB — PREPARE RBC (CROSSMATCH)

## 2014-10-07 MED ORDER — SODIUM CHLORIDE 0.9 % IV SOLN: Freq: Once | INTRAVENOUS | Status: AC

## 2014-10-07 MED ORDER — SODIUM CHLORIDE 0.9 % IV BOLUS (SEPSIS)
500.0000 mL | Freq: Once | INTRAVENOUS | Status: AC
  Administered 2014-10-07: 500 mL via INTRAVENOUS

## 2014-10-07 MED ORDER — INFLUENZA VAC SPLIT QUAD 0.5 ML IM SUSY: 0.5000 mL | PREFILLED_SYRINGE | INTRAMUSCULAR | Status: DC | PRN

## 2014-10-07 MED ORDER — PNEUMOCOCCAL VAC POLYVALENT 25 MCG/0.5ML IJ INJ: 0.5000 mL | INJECTION | INTRAMUSCULAR | Status: DC | PRN

## 2014-10-07 NOTE — Progress Notes (Signed)
Patient agitated and verbally aggressive towards staff. States "I want to go. I don't care, I want to go." MD made aware patient is wanted to leave the hospital and is coming to bedside to speak with the patient. I have attempted to explain to the patient all that has been done for him and why- such as endoscopy, blood transfusions and fluid bolus, all due to his low blood counts which suggest bleeding. I also informed him that his blood pressure is low which he understands. He states he wants to sign himself out. Vital signs stable at this time and patient is alert and oriented.   [name]

## 2014-10-07 NOTE — Progress Notes (Signed)
Patient ID: [name], male   DOB: 07/25/1961, 54 y.o.   MRN: [id number]     [address] [place], [place] [address]    Phone: [phone] FAX: [phone]     Subjective: Pt denies any bloody BMs since Sunday, but increase in abdominal pain. Wants to leave the hospital, I advised against it given bleeding, low h&h, bp stating he could die.  He understands this, will contact primary service.  Objective:  Vital signs:  Filed Vitals:   10/07/14 0545 10/07/14 0550 10/07/14 0600 10/07/14 0700  BP: 111/61  96/51 93/51  Pulse: 83 77 81 80  Temp:  98.6 F (37 C)    TempSrc:  Oral Oral Oral  Resp: _0 Height:      Weight:      SpO2: 100% 100% 100% 100%    Last BM Date: 10/05/14  Intake/Output   Yesterday:  03/07 0701 - 03/08 0700 In: 3104.6 [I.V.:1973.8; Blood:630.8; IV Piggyback:500] Out: 1625 [Urine:1625] This shift:    I/O last 3 completed shifts: In: 4690.1 [I.V.:2771.3; Blood:1418.8; IV Piggyback:500] Out: 7209 [Urine:3275]    Physical Exam: General: Pt awake/alert/oriented x4 in no acute distress Abdomen: Soft.  Nondistended. Non tender.  No evidence of peritonitis.  No incarcerated hernias.   Problem List:   Active Problems:   GI bleed   Acute upper GI bleed    Results:   Labs: Results for orders placed or performed during the hospital encounter of 10/05/14 (from the past 48 hour(s))  Glucose, capillary     Status: Abnormal   Collection Time: 10/05/14  7:54 PM  Result Value Ref Range   Glucose-Capillary 138 (H) 70 - 99 mg/dL  Comprehensive metabolic panel     Status: Abnormal   Collection Time: 10/05/14  8:04 PM  Result Value Ref Range   Sodium 138 135 - 145 mmol/L   Potassium 4.1 3.5 - 5.1 mmol/L   Chloride 107 96 - 112 mmol/L   CO2 27 19 - 32 mmol/L   Glucose, Bld 154 (H) 70 - 99 mg/dL   BUN 43 (H) 6 - 23 mg/dL   Creatinine, Ser 1.52 (H) 0.50 - 1.35 mg/dL   Calcium  7.2 (L) 8.4 - 10.5 mg/dL   Total Protein 3.5 (L) 6.0 - 8.3 g/dL   Albumin 1.9 (L) 3.5 - 5.2 g/dL   AST 18 0 - 37 U/L   ALT 11 0 - 53 U/L   Alkaline Phosphatase 26 (L) 39 - 117 U/L   Total Bilirubin 0.4 0.3 - 1.2 mg/dL   GFR calc non Af Amer 50 (L) >90 mL/min   GFR calc Af Amer 58 (L) >90 mL/min    Comment: (NOTE) The eGFR has been calculated using the CKD EPI equation. This calculation has not been validated in all clinical situations. eGFR's persistently <90 mL/min signify possible Chronic Kidney Disease.    Anion gap 4 (L) 5 - 15  Troponin I     Status: None   Collection Time: 10/05/14  8:04 PM  Result Value Ref Range   Troponin I <0.03 <0.031 ng/mL    Comment:        NO INDICATION OF MYOCARDIAL INJURY.   CBC with Differential     Status: Abnormal   Collection Time: 10/05/14  8:04 PM  Result Value Ref Range   WBC 14.8 (H) 4.0 - 10.5 K/uL   RBC 2.16 (  L) 4.22 - 5.81 MIL/uL   Hemoglobin 6.0 (LL) 13.0 - 17.0 g/dL    Comment: REPEATED TO VERIFY CRITICAL RESULT CALLED TO, READ BACK BY AND VERIFIED WITH: G.[facility] 2339 10/05/14 M.CAMPBELL    HCT 18.0 (L) 39.0 - 52.0 %   MCV 83.3 78.0 - 100.0 fL   MCH 27.8 26.0 - 34.0 pg   MCHC 33.3 30.0 - 36.0 g/dL   RDW 13.7 11.5 - 15.5 %   Platelets 143 (L) 150 - 400 K/uL   Neutrophils Relative % 78 (H) 43 - 77 %   Neutro Abs 11.5 (H) 1.7 - 7.7 K/uL   Lymphocytes Relative 16 12 - 46 %   Lymphs Abs 2.4 0.7 - 4.0 K/uL   Monocytes Relative 6 3 - 12 %   Monocytes Absolute 0.9 0.1 - 1.0 K/uL   Eosinophils Relative 0 0 - 5 %   Eosinophils Absolute 0.0 0.0 - 0.7 K/uL   Basophils Relative 0 0 - 1 %   Basophils Absolute 0.0 0.0 - 0.1 K/uL  I-STAT, chem 8     Status: Abnormal   Collection Time: 10/05/14  8:43 PM  Result Value Ref Range   Sodium 138 135 - 145 mmol/L   Potassium 4.0 3.5 - 5.1 mmol/L   Chloride 102 96 - 112 mmol/L   BUN 42 (H) 6 - 23 mg/dL   Creatinine, Ser 1.50 (H) 0.50 - 1.35 mg/dL   Glucose, Bld 148 (H) 70 - 99 mg/dL    Calcium, Ion 1.12 1.12 - 1.23 mmol/L   TCO2 20 0 - 100 mmol/L   Hemoglobin 5.1 (LL) 13.0 - 17.0 g/dL   HCT 15.0 (L) 39.0 - 52.0 %   Comment NOTIFIED PHYSICIAN   CG4 I-STAT (Lactic acid)     Status: Abnormal   Collection Time: 10/05/14  8:44 PM  Result Value Ref Range   Lactic Acid, Venous 2.91 (HH) 0.5 - 2.0 mmol/L   Comment NOTIFIED PHYSICIAN   Prepare RBC     Status: None   Collection Time: 10/05/14  8:50 PM  Result Value Ref Range   Order Confirmation ORDER PROCESSED BY BLOOD BANK   Occult blood, poc device     Status: Abnormal   Collection Time: 10/05/14  9:01 PM  Result Value Ref Range   Fecal Occult Bld POSITIVE (A) NEGATIVE  Type and screen     Status: None (Preliminary result)   Collection Time: 10/05/14  9:10 PM  Result Value Ref Range   ABO/RH(D) O NEG    Antibody Screen NEG    Sample Expiration 10/08/2014    Unit Number [id number]    Blood Component Type RBC CPDA1, LR    Unit division 00    Status of Unit ISSUED,FINAL    Transfusion Status OK TO TRANSFUSE    Crossmatch Result COMPATIBLE    Unit Number [id number]    Blood Component Type RBC LR PHER1    Unit division 00    Status of Unit ISSUED    Transfusion Status OK TO TRANSFUSE    Crossmatch Result COMPATIBLE    Unit Number [id number]    Blood Component Type RBC LR PHER2    Unit division 00    Status of Unit ISSUED,FINAL    Transfusion Status OK TO TRANSFUSE    Crossmatch Result COMPATIBLE    Unit Number [id number]    Blood Component Type RBC LR PHER2    Unit division 00    Status of Unit ISSUED,FINAL  Transfusion Status OK TO TRANSFUSE    Crossmatch Result COMPATIBLE   Prepare RBC     Status: None   Collection Time: 10/05/14 11:06 PM  Result Value Ref Range   Order Confirmation ORDER PROCESSED BY BLOOD BANK   MRSA PCR Screening     Status: None   Collection Time: 10/05/14 11:53 PM  Result Value Ref Range   MRSA by PCR NEGATIVE NEGATIVE    Comment:        The GeneXpert MRSA Assay  (FDA approved for NASAL specimens only), is one component of a comprehensive MRSA colonization surveillance program. It is not intended to diagnose MRSA infection nor to guide or monitor treatment for MRSA infections.   Urinalysis, Routine w reflex microscopic     Status: Abnormal   Collection Time: 10/06/14 12:19 AM  Result Value Ref Range   Color, Urine YELLOW YELLOW   APPearance CLOUDY (A) CLEAR   Specific Gravity, Urine 1.021 1.005 - 1.030   pH 5.0 5.0 - 8.0   Glucose, UA NEGATIVE NEGATIVE mg/dL   Hgb urine dipstick NEGATIVE NEGATIVE   Bilirubin Urine NEGATIVE NEGATIVE   Ketones, ur NEGATIVE NEGATIVE mg/dL   Protein, ur NEGATIVE NEGATIVE mg/dL   Urobilinogen, UA 0.2 0.0 - 1.0 mg/dL   Nitrite NEGATIVE NEGATIVE   Leukocytes, UA NEGATIVE NEGATIVE    Comment: MICROSCOPIC NOT DONE ON URINES WITH NEGATIVE PROTEIN, BLOOD, LEUKOCYTES, NITRITE, OR GLUCOSE <1000 mg/dL.  Glucose, capillary     Status: None   Collection Time: 10/06/14 12:25 AM  Result Value Ref Range   Glucose-Capillary 99 70 - 99 mg/dL  CBC     Status: Abnormal   Collection Time: 10/06/14  5:00 AM  Result Value Ref Range   WBC 11.4 (H) 4.0 - 10.5 K/uL   RBC 2.28 (L) 4.22 - 5.81 MIL/uL   Hemoglobin 6.6 (LL) 13.0 - 17.0 g/dL    Comment: REPEATED TO VERIFY CRITICAL VALUE NOTED.  VALUE IS CONSISTENT WITH PREVIOUSLY REPORTED AND CALLED VALUE.    HCT 19.2 (L) 39.0 - 52.0 %   MCV 84.2 78.0 - 100.0 fL   MCH 28.9 26.0 - 34.0 pg   MCHC 34.4 30.0 - 36.0 g/dL   RDW 13.6 11.5 - 15.5 %   Platelets 113 (L) 150 - 400 K/uL    Comment: PLATELET COUNT CONFIRMED BY SMEAR  Basic metabolic panel     Status: Abnormal   Collection Time: 10/06/14  5:00 AM  Result Value Ref Range   Sodium 140 135 - 145 mmol/L   Potassium 4.2 3.5 - 5.1 mmol/L   Chloride 114 (H) 96 - 112 mmol/L   CO2 23 19 - 32 mmol/L   Glucose, Bld 87 70 - 99 mg/dL   BUN 32 (H) 6 - 23 mg/dL   Creatinine, Ser 1.12 0.50 - 1.35 mg/dL   Calcium 6.9 (L) 8.4 - 10.5  mg/dL   GFR calc non Af Amer 73 (L) >90 mL/min   GFR calc Af Amer 84 (L) >90 mL/min    Comment: (NOTE) The eGFR has been calculated using the CKD EPI equation. This calculation has not been validated in all clinical situations. eGFR's persistently <90 mL/min signify possible Chronic Kidney Disease.    Anion gap 3 (L) 5 - 15  Magnesium     Status: None   Collection Time: 10/06/14  5:00 AM  Result Value Ref Range   Magnesium 2.0 1.5 - 2.5 mg/dL  Phosphorus     Status: None  Collection Time: 10/06/14  5:00 AM  Result Value Ref Range   Phosphorus 2.9 2.3 - 4.6 mg/dL  Protime-INR     Status: None   Collection Time: 10/06/14  6:51 AM  Result Value Ref Range   Prothrombin Time 15.0 11.6 - 15.2 seconds   INR 1.17 0.00 - 1.49  Prepare RBC     Status: None   Collection Time: 10/06/14  7:00 AM  Result Value Ref Range   Order Confirmation BB SAMPLE OR UNITS ALREADY AVAILABLE   CBC     Status: Abnormal   Collection Time: 10/06/14  4:52 PM  Result Value Ref Range   WBC 13.6 (H) 4.0 - 10.5 K/uL   RBC 2.95 (L) 4.22 - 5.81 MIL/uL   Hemoglobin 8.7 (L) 13.0 - 17.0 g/dL    Comment: POST TRANSFUSION SPECIMEN   HCT 25.0 (L) 39.0 - 52.0 %   MCV 84.7 78.0 - 100.0 fL   MCH 29.5 26.0 - 34.0 pg   MCHC 34.8 30.0 - 36.0 g/dL   RDW 13.5 11.5 - 15.5 %   Platelets 128 (L) 150 - 400 K/uL  CBC     Status: Abnormal   Collection Time: 10/06/14 11:00 PM  Result Value Ref Range   WBC 10.2 4.0 - 10.5 K/uL   RBC 2.45 (L) 4.22 - 5.81 MIL/uL   Hemoglobin 7.1 (L) 13.0 - 17.0 g/dL    Comment: REPEATED TO VERIFY DELTA CHECK NOTED    HCT 20.7 (L) 39.0 - 52.0 %   MCV 84.5 78.0 - 100.0 fL   MCH 29.0 26.0 - 34.0 pg   MCHC 34.3 30.0 - 36.0 g/dL   RDW 13.6 11.5 - 15.5 %   Platelets 122 (L) 150 - 400 K/uL  CBC     Status: Abnormal   Collection Time: 10/07/14  2:18 AM  Result Value Ref Range   WBC 12.0 (H) 4.0 - 10.5 K/uL   RBC 2.12 (L) 4.22 - 5.81 MIL/uL   Hemoglobin 6.3 (LL) 13.0 - 17.0 g/dL    Comment:  REPEATED TO VERIFY CRITICAL RESULT CALLED TO, READ BACK BY AND VERIFIED WITH: C.MANES,RN 0336 10/07/14 M.CAMPBELL    HCT 18.7 (L) 39.0 - 52.0 %   MCV 88.2 78.0 - 100.0 fL   MCH 29.7 26.0 - 34.0 pg   MCHC 33.7 30.0 - 36.0 g/dL   RDW 13.8 11.5 - 15.5 %   Platelets 128 (L) 150 - 400 K/uL  Prepare RBC     Status: None   Collection Time: 10/07/14  3:41 AM  Result Value Ref Range   Order Confirmation ORDER PROCESSED BY BLOOD BANK     Imaging / Studies: Dg Chest Port 1 View  10/05/2014   CLINICAL DATA:  Hypotension and near syncope  EXAM: PORTABLE CHEST - 1 VIEW  COMPARISON:  05/27/2013  FINDINGS: Left apical pleural-parenchymal scarring is unchanged. Remaining lungs are clear. Negative for pneumonia or heart failure.  IMPRESSION: No active disease.   Electronically Signed   By: Franchot Gallo M.D.   On: 10/05/2014 20:40    Medications / Allergies:  Scheduled Meds: . antiseptic oral rinse  7 mL Mouth Rinse q12n4p  . chlorhexidine  15 mL Mouth Rinse BID  . folic acid  1 mg Intravenous Daily  . [START ON 10/09/2014] pantoprazole (PROTONIX) IV  40 mg Intravenous Q12H  . thiamine  100 mg Intravenous Daily   Continuous Infusions: . dextrose 5 % and 0.45 % NaCl with KCl 20 mEq/L  75 mL/hr at 10/06/14 2308  . pantoprozole (PROTONIX) infusion 8 mg/hr (10/06/14 2152)   PRN Meds:.diazepam  Antibiotics: Anti-infectives    None        Assessment/Plan UGI bleeding Duodenal bulb ulcer  The patient wants to sign out AMA.  This fits with his history of non-compliant behavior, which probably has contributed to his recurrent ulcer and GI bleed.   I advised him against this, will let his primary service know as well.  He has not had any bloody BMs, but h&h dropped despite 3 units of PRBCs and BP is low.  CBC is pending.  Will make him NPO.  I will call IR for a consultation.  Again, he would not be a good surgical candidate given his previous elap, duodenotomy and oversew of ulcer in this area.   This ulcer seems to be in the same area as the previous surgery. Will follow along.    Erby Pian, [facility] Surgery Pager [phone]) For consults and floor pages call [phone](7A-4:30P)  10/07/2014  8:02 AM   Agree with above.  Edits made to note  Imogene Burn. Georgette Dover, MD, [facility] Surgery  General/ Trauma Surgery  10/07/2014 8:46 AM

## 2014-10-07 NOTE — Progress Notes (Signed)
All three bag of belongings including clothing sent home with patient. Security called to escort patient given his behavior.

## 2014-10-07 NOTE — Progress Notes (Signed)
eLink Physician-Brief Progress Note Patient Name: Ryan [name] DOB: 07/08/1961 MRN: [id number]   Date of Service  10/07/2014  HPI/Events of Note  Hypotension with BP of 88/41 (51).  Receiving blood transfusion.  eICU Interventions  Plan: Continue with transfusion 500 cc NS bolus for BP support     Intervention Category Intermediate Interventions: Hypotension - evaluation and management  DETERDING,ELIZABETH 10/07/2014, 4:49 AM

## 2014-10-07 NOTE — Progress Notes (Signed)
CRITICAL VALUE ALERT  Critical value received:  HGB 6.3  Date of notification:  10/07/2014  Time of notification:  0336  Critical value read back:Yes.    Nurse who received alert:  Ree Edmanrystal Manus, RN  MD notified (1st page):  Dr. Darrick Pennaeterding  Time of first page:  [phone]  MD notified (2nd page):  Time of second page:  Responding MD:  Dr. Darrick Pennaeterding  Time MD responded:  [phone]

## 2014-10-07 NOTE — Progress Notes (Signed)
Concern that he had additional bleeding overnight, and he was getting transfusion.  He does not feel like anything is being done for him and he is tired of sitting in hospital.  He is verbally abusive to staff.  He insists on leaving the hospital.  I informed him that this is not a good decision, and that he has potential to bleed to death.  He understands this, but does not want to stay in hospital anymore.  In my opinion he is cable of making medical decisions, even though these are bad decisions.  I informed him that we can not hold him in hospital against his will, but that my strong advice is for him to remain in hospital and continue therapy >> again emphasized that he could potentially bleed to death.  He states he will leave the hospital.  Coralyn HellingVineet Stacey Maura, MD [facility] Pulmonary/Critical Care 10/07/2014, 8:51 AM Pager:  [phone] After 3pm call: [phone]

## 2014-10-07 NOTE — Progress Notes (Signed)
eLink Physician-Brief Progress Note Patient Name: Ryan GradRandy Frazier DOB: 01/10/1961 MRN: [id number]   Date of Service  10/07/2014  HPI/Events of Note  Hgb 6.3 down from 7.1 - known GIB  eICU Interventions  Plan: Transfuse 1 unit pRBC Post-transfusion CBC     Intervention Category Intermediate Interventions: Bleeding - evaluation and treatment with blood products  [name] 10/07/2014, 3:41 AM

## 2014-10-08 ENCOUNTER — Encounter (HOSPITAL_COMMUNITY): Payer: Self-pay

## 2014-10-08 ENCOUNTER — Inpatient Hospital Stay (HOSPITAL_COMMUNITY)
Admission: EM | Admit: 2014-10-08 | Discharge: 2014-10-20 | DRG: 326 | Disposition: A | Discharge status: Home Health | Payer: Medicaid Other | Attending: Internal Medicine | Admitting: Internal Medicine

## 2014-10-08 ENCOUNTER — Inpatient Hospital Stay (HOSPITAL_COMMUNITY): Payer: Medicaid Other

## 2014-10-08 DIAGNOSIS — A419 Sepsis, unspecified organism: Secondary | ICD-10-CM | POA: Diagnosis not present

## 2014-10-08 DIAGNOSIS — J9811 Atelectasis: Secondary | ICD-10-CM | POA: Diagnosis not present

## 2014-10-08 DIAGNOSIS — K567 Ileus, unspecified: Secondary | ICD-10-CM

## 2014-10-08 DIAGNOSIS — F1010 Alcohol abuse, uncomplicated: Secondary | ICD-10-CM | POA: Diagnosis present

## 2014-10-08 DIAGNOSIS — K9189 Other postprocedural complications and disorders of digestive system: Secondary | ICD-10-CM

## 2014-10-08 DIAGNOSIS — R55 Syncope and collapse: Secondary | ICD-10-CM | POA: Diagnosis present

## 2014-10-08 DIAGNOSIS — I10 Essential (primary) hypertension: Secondary | ICD-10-CM | POA: Diagnosis present

## 2014-10-08 DIAGNOSIS — N179 Acute kidney failure, unspecified: Secondary | ICD-10-CM | POA: Diagnosis not present

## 2014-10-08 DIAGNOSIS — F609 Personality disorder, unspecified: Secondary | ICD-10-CM | POA: Diagnosis present

## 2014-10-08 DIAGNOSIS — J9600 Acute respiratory failure, unspecified whether with hypoxia or hypercapnia: Secondary | ICD-10-CM | POA: Diagnosis not present

## 2014-10-08 DIAGNOSIS — E861 Hypovolemia: Secondary | ICD-10-CM | POA: Diagnosis present

## 2014-10-08 DIAGNOSIS — Z9119 Patient's noncompliance with other medical treatment and regimen: Secondary | ICD-10-CM | POA: Diagnosis present

## 2014-10-08 DIAGNOSIS — J690 Pneumonitis due to inhalation of food and vomit: Secondary | ICD-10-CM | POA: Diagnosis not present

## 2014-10-08 DIAGNOSIS — F17210 Nicotine dependence, cigarettes, uncomplicated: Secondary | ICD-10-CM | POA: Diagnosis present

## 2014-10-08 DIAGNOSIS — F1910 Other psychoactive substance abuse, uncomplicated: Secondary | ICD-10-CM | POA: Diagnosis present

## 2014-10-08 DIAGNOSIS — R451 Restlessness and agitation: Secondary | ICD-10-CM | POA: Diagnosis not present

## 2014-10-08 DIAGNOSIS — F1290 Cannabis use, unspecified, uncomplicated: Secondary | ICD-10-CM | POA: Diagnosis present

## 2014-10-08 DIAGNOSIS — J9690 Respiratory failure, unspecified, unspecified whether with hypoxia or hypercapnia: Secondary | ICD-10-CM

## 2014-10-08 DIAGNOSIS — K920 Hematemesis: Secondary | ICD-10-CM | POA: Diagnosis present

## 2014-10-08 DIAGNOSIS — Z91018 Allergy to other foods: Secondary | ICD-10-CM

## 2014-10-08 DIAGNOSIS — E876 Hypokalemia: Secondary | ICD-10-CM | POA: Diagnosis present

## 2014-10-08 DIAGNOSIS — Z888 Allergy status to other drugs, medicaments and biological substances status: Secondary | ICD-10-CM

## 2014-10-08 DIAGNOSIS — D62 Acute posthemorrhagic anemia: Secondary | ICD-10-CM | POA: Diagnosis present

## 2014-10-08 DIAGNOSIS — K922 Gastrointestinal hemorrhage, unspecified: Secondary | ICD-10-CM | POA: Diagnosis present

## 2014-10-08 DIAGNOSIS — F1410 Cocaine abuse, uncomplicated: Secondary | ICD-10-CM | POA: Diagnosis present

## 2014-10-08 DIAGNOSIS — K219 Gastro-esophageal reflux disease without esophagitis: Secondary | ICD-10-CM | POA: Diagnosis present

## 2014-10-08 DIAGNOSIS — K264 Chronic or unspecified duodenal ulcer with hemorrhage: Secondary | ICD-10-CM | POA: Diagnosis present

## 2014-10-08 DIAGNOSIS — K269 Duodenal ulcer, unspecified as acute or chronic, without hemorrhage or perforation: Secondary | ICD-10-CM | POA: Diagnosis not present

## 2014-10-08 DIAGNOSIS — K260 Acute duodenal ulcer with hemorrhage: Secondary | ICD-10-CM | POA: Diagnosis present

## 2014-10-08 DIAGNOSIS — D696 Thrombocytopenia, unspecified: Secondary | ICD-10-CM | POA: Diagnosis present

## 2014-10-08 DIAGNOSIS — Z452 Encounter for adjustment and management of vascular access device: Secondary | ICD-10-CM

## 2014-10-08 DIAGNOSIS — K913 Postprocedural intestinal obstruction: Secondary | ICD-10-CM | POA: Diagnosis not present

## 2014-10-08 LAB — CBC
HEMATOCRIT: 15.5 % — AB (ref 39.0–52.0)
Hemoglobin: 5.2 g/dL — CL (ref 13.0–17.0)
MCH: 30.6 pg (ref 26.0–34.0)
MCHC: 33.5 g/dL (ref 30.0–36.0)
MCV: 91.2 fL (ref 78.0–100.0)
Platelets: 168 10*3/uL (ref 150–400)
RBC: 1.7 MIL/uL — ABNORMAL LOW (ref 4.22–5.81)
RDW: 14.6 % (ref 11.5–15.5)
WBC: 18.6 10*3/uL — ABNORMAL HIGH (ref 4.0–10.5)

## 2014-10-08 LAB — COMPREHENSIVE METABOLIC PANEL
ALT: 13 U/L (ref 0–53)
ANION GAP: 6 (ref 5–15)
AST: 26 U/L (ref 0–37)
Albumin: 2.1 g/dL — ABNORMAL LOW (ref 3.5–5.2)
Alkaline Phosphatase: 30 U/L — ABNORMAL LOW (ref 39–117)
BILIRUBIN TOTAL: 0.2 mg/dL — AB (ref 0.3–1.2)
BUN: 30 mg/dL — AB (ref 6–23)
CALCIUM: 7.3 mg/dL — AB (ref 8.4–10.5)
CO2: 20 mmol/L (ref 19–32)
CREATININE: 1.32 mg/dL (ref 0.50–1.35)
Chloride: 108 mmol/L (ref 96–112)
GFR calc non Af Amer: 60 mL/min — ABNORMAL LOW (ref >90)
GFR, EST AFRICAN AMERICAN: 69 mL/min — AB (ref >90)
GLUCOSE: 138 mg/dL — AB (ref 70–99)
Potassium: 3.4 mmol/L — ABNORMAL LOW (ref 3.5–5.1)
SODIUM: 134 mmol/L — AB (ref 135–145)
Total Protein: 3.8 g/dL — ABNORMAL LOW (ref 6.0–8.3)

## 2014-10-08 LAB — TYPE AND SCREEN
ABO/RH(D): O NEG
Antibody Screen: NEGATIVE
UNIT DIVISION: 0
Unit division: 0
Unit division: 0
Unit division: 0

## 2014-10-08 LAB — RAPID URINE DRUG SCREEN, HOSP PERFORMED
Amphetamines: NOT DETECTED
BARBITURATES: NOT DETECTED
BENZODIAZEPINES: POSITIVE — AB
COCAINE: POSITIVE — AB
OPIATES: NOT DETECTED
Tetrahydrocannabinol: NOT DETECTED

## 2014-10-08 LAB — APTT: APTT: 26 s (ref 24–37)

## 2014-10-08 LAB — PROTIME-INR
INR: 1.14 (ref 0.00–1.49)
Prothrombin Time: 14.7 seconds (ref 11.6–15.2)

## 2014-10-08 LAB — PREPARE RBC (CROSSMATCH)

## 2014-10-08 LAB — MRSA PCR SCREENING: MRSA BY PCR: NEGATIVE

## 2014-10-08 MED ORDER — SODIUM CHLORIDE 0.9 % IV SOLN
Freq: Once | INTRAVENOUS | Status: AC
  Administered 2014-10-08: 11:00:00 via INTRAVENOUS

## 2014-10-08 MED ORDER — SODIUM CHLORIDE 0.9 % IV BOLUS (SEPSIS)
1000.0000 mL | Freq: Once | INTRAVENOUS | Status: AC
  Administered 2014-10-08: 1000 mL via INTRAVENOUS

## 2014-10-08 MED ORDER — SODIUM CHLORIDE 0.9 % IV SOLN
INTRAVENOUS | Status: DC
  Administered 2014-10-08: 14:00:00 via INTRAVENOUS

## 2014-10-08 MED ORDER — LORAZEPAM 2 MG/ML IJ SOLN
0.5000 mg | INTRAMUSCULAR | Status: DC | PRN
  Filled 2014-10-08: qty 1

## 2014-10-08 MED ORDER — FENTANYL CITRATE 0.05 MG/ML IJ SOLN
INTRAMUSCULAR | Status: AC
  Filled 2014-10-08: qty 4

## 2014-10-08 MED ORDER — FENTANYL CITRATE 0.05 MG/ML IJ SOLN
INTRAMUSCULAR | Status: AC | PRN
  Administered 2014-10-08: 50 ug via INTRAVENOUS
  Administered 2014-10-08: 25 ug via INTRAVENOUS

## 2014-10-08 MED ORDER — POTASSIUM CHLORIDE 10 MEQ/100ML IV SOLN
10.0000 meq | INTRAVENOUS | Status: AC
  Administered 2014-10-08 (×3): 10 meq via INTRAVENOUS
  Filled 2014-10-08 (×3): qty 100

## 2014-10-08 MED ORDER — LIDOCAINE HCL 1 % IJ SOLN
INTRAMUSCULAR | Status: AC
  Filled 2014-10-08: qty 20

## 2014-10-08 MED ORDER — PANTOPRAZOLE SODIUM 40 MG IV SOLR
8.0000 mg/h | INTRAVENOUS | Status: AC
  Administered 2014-10-08 – 2014-10-10 (×5): 8 mg/h via INTRAVENOUS
  Filled 2014-10-08 (×14): qty 80

## 2014-10-08 MED ORDER — MIDAZOLAM HCL 2 MG/2ML IJ SOLN
INTRAMUSCULAR | Status: AC | PRN
  Administered 2014-10-08 (×2): 0.5 mg via INTRAVENOUS
  Administered 2014-10-08: 1 mg via INTRAVENOUS

## 2014-10-08 MED ORDER — SODIUM CHLORIDE 0.9 % IV SOLN
80.0000 mg | Freq: Once | INTRAVENOUS | Status: AC
  Administered 2014-10-08: 80 mg via INTRAVENOUS
  Filled 2014-10-08: qty 80

## 2014-10-08 MED ORDER — SODIUM CHLORIDE 0.9 % IV SOLN
250.0000 mL | INTRAVENOUS | Status: DC | PRN
  Administered 2014-10-09: 250 mL via INTRAVENOUS

## 2014-10-08 MED ORDER — PANTOPRAZOLE SODIUM 40 MG IV SOLR: 40.0000 mg | Freq: Two times a day (BID) | INTRAVENOUS | Status: DC

## 2014-10-08 MED ORDER — MIDAZOLAM HCL 2 MG/2ML IJ SOLN
INTRAMUSCULAR | Status: AC
  Filled 2014-10-08: qty 6

## 2014-10-08 NOTE — ED Notes (Signed)
He remains in no distress; and is very interested in watching TV, with which I assist him.  He has made three phone calls since arrival also.

## 2014-10-08 NOTE — H&P (Signed)
PULMONARY / CRITICAL CARE MEDICINE   Name: [name] MRN: [id number] DOB: 28-Sep-1960    ADMISSION DATE:  10/08/2014 CONSULTATION DATE:  10/08/14  REFERRING MD :  Madilyn Hook   CHIEF COMPLAINT:  Melena, near-syncope  INITIAL PRESENTATION: [name] is a 54 year old man with a history of ETOH abuse and recurrent UGIB due to ulcers who presented to ED 3/9 with melena and near-syncope. Patient was admitted to [facility] 3/6 with same problem and left AMA 3/8 in am.   STUDIES:  3/7 endoscopy -  Large non-bleeding and deep ulcer with visible vessel in duodenal bulb. Normal mucosa of esophagus and stomach.    SIGNIFICANT EVENTS: 3/9 admit, IR consulted    HISTORY OF PRESENT ILLNESS:  [name] is a 54 year old man with a history of ETOH abuse, polysubstance abuse, and recurrent UGIB due to ulcers who presented to [facility] ED 3/9 with melena, weakness, and near-syncope. Patient was admitted to [facility] 3/6 with same and left AMA 3/8 in am. Patient reported bleeding beginning again pm 3/8. In 2014 he was admitted 1/9-1/20 for UGI bleed and subsequent exploratory laparotomy and posterior duodenal oversew. Had recurrent bleed October 2014, had IR embolization of GDA with recurrent bleeding 2 weeks later not requiring intervention.   PAST MEDICAL HISTORY :   has a past medical history of Abdominal pain, other specified site; GERD (gastroesophageal reflux disease); Abnormal CT scan, esophagus (03/2012); and Substance abuse (03/2012).  has past surgical history that includes undescended testicle; Esophagogastroduodenoscopy (08/10/2012); laparotomy (08/10/2012); Esophagogastroduodenoscopy (Left, 05/01/2013); Esophagogastroduodenoscopy (N/A, 05/11/2013); and Esophagogastroduodenoscopy (N/A, 10/06/2014). Prior to Admission medications   Medication Sig Start Date End Date Taking? Authorizing Provider  HYDROcodone-acetaminophen (NORCO/VICODIN) 5-325 MG per tablet Take 1 tablet by mouth every 4 (four) hours as  needed for moderate pain. Patient not taking: Reported on 10/06/2014 02/24/14   Azalia Bilis, MD   Allergies  Allergen Reactions  . Chocolate Nausea And Vomiting  . Peanut-Containing Drug Products Nausea And Vomiting  . Spinach Nausea And Vomiting    FAMILY HISTORY:  has no family status information on file.  SOCIAL HISTORY:  reports that he has been smoking.  He has never used smokeless tobacco. He reports that he drinks alcohol. He reports that he uses illicit drugs (Marijuana).  REVIEW OF SYSTEMS:  Denies abdominal pain.   SUBJECTIVE:  Wants to eat  VITAL SIGNS: Temp:  [97.8 F (36.6 C)-98.2 F (36.8 C)] 98.2 F (36.8 C) (03/09 0947) Pulse Rate:  [102-110] 110 (03/09 1030) Resp:  [16-18] 16 (03/09 1030) BP: (96-146)/(52-64) 96/64 mmHg (03/09 1030) SpO2:  [96 %-100 %] 100 % (03/09 1030) HEMODYNAMICS:   VENTILATOR SETTINGS:  On room air    INTAKE / OUTPUT: No intake or output data in the 24 hours ending 10/08/14 1145  PHYSICAL EXAMINATION: General: AAM in no acute distress  Neuro:  Alert, oriented, follows commands  HEENT:  NCAT Cardiovascular:  RRR, faint systolic murmur  Lungs:  Clear bilaterally  Abdomen: Flat, soft, nontender Musculoskeletal:  No deformity, no edema.  Skin:  Grossly intact.   LABS:  CBC  Recent Labs Lab 10/07/14 0218 10/07/14 0700 10/08/14 0954  WBC 12.0* 13.6* 18.6*  HGB 6.3* 6.7* 5.2*  HCT 18.7* 19.1* 15.5*  PLT 128* 111* 168   Coag's  Recent Labs Lab 10/06/14 0651  INR 1.17   BMET  Recent Labs Lab 10/05/14 2004 10/05/14 2043 10/06/14 0500 10/08/14 0954  NA 138 138 140 134*  K 4.1  4.0 4.2 3.4*  CL 107 102 114* 108  CO2 27  --  23 20  BUN 43* 42* 32* 30*  CREATININE 1.52* 1.50* 1.12 1.32  GLUCOSE 154* 148* 87 138*   Electrolytes  Recent Labs Lab 10/05/14 2004 10/06/14 0500 10/08/14 0954  CALCIUM 7.2* 6.9* 7.3*  MG  --  2.0  --   PHOS  --  2.9  --    Sepsis Markers  Recent Labs Lab 10/05/14 2044   LATICACIDVEN 2.91*   ABG No results for input(s): PHART, PCO2ART, PO2ART in the last 168 hours. Liver Enzymes  Recent Labs Lab 10/05/14 2004 10/08/14 0954  AST 18 26  ALT 11 13  ALKPHOS 26* 30*  BILITOT 0.4 0.2*  ALBUMIN 1.9* 2.1*   Cardiac Enzymes  Recent Labs Lab 10/05/14 2004  TROPONINI <0.03   Glucose  Recent Labs Lab 10/05/14 1954 10/06/14 0025  GLUCAP 138* 99    Imaging No results found.   ASSESSMENT / PLAN:  PULMONARY OETT A: Tobacco abuse  P:   Smoking cessation  CARDIOVASCULAR CVL A:  Tachycardia due to acute blood loss  Hypovolemia Near syncope due to symptomatic anemia  P:  Volume resuscitation   RENAL A:  No active issues: at risk for AKI due to hypovolemia.  P:   Monitor BMET Replace electrolytes as needed   GASTROINTESTINAL A:   Acute GI bleed due to duodenal ulcer  P:   Pantoprazole infusion Send H pylori screen per GI  See Heme section   HEMATOLOGIC A:  Anemia due to acute blood loss  P:  Transfuse PRBCS  Trend CBC VTE prophylaxis - SCDs   INFECTIOUS A:  Leukocytosis  P:   Trend CBC    ENDOCRINE A:  No active issues P:   Trend glucose on BMET.   NEUROLOGIC A:   ETOH abuse  H/o polysubstance abuse  P:   Monitor for withdrawal.  Thiamine, folic acid   FAMILY  - Updates: pending    TODAY'S SUMMARY: [name] is a 54 year old man with a history of ETOH abuse and recurrent UGIB due to ulcers who presented to ED with melena and near-syncope after [facility]. Transfused with 2 units PRBCs. Pantoprazole infusion. GI & IR consulted for definitive management of ulcer.   Pulmonary and Critical Care Medicine [facility] Pager: [phone]  10/08/2014, 11:45 AM   Attending:  I have seen and examined the patient with nurse practitioner/resident and agree with the note above.   [name] is well known to our service as he left AMA yesterday after a brief  hospital stay for a severe bleeding duodenal ulcer for which he refused appropriate treatement.  He notes that he was having more belly pain today so he came back to the hospital.    On exam he has mild tenderness in the epigastrum, but no guarding or rebound tenderness  Bleeding duodenal ulcer> We will pick up where we left off yesterday by calling IR to evaluate for embolization, we will consult GI medicine again Hemorrhagic anemia> transfuse PRBC 2 Units now, goal Hgb > 8gm/dL while actively bleeding Admit to the ICU  My cc time 40 minutes  Heber CarolinaBrent McQuaid, MD [facility] Pager: [phone] Cell: [phone] If no response, call [phone]

## 2014-10-08 NOTE — ED Notes (Signed)
Bed: WA13 Expected date:  Expected time:  Means of arrival:  Comments: Rectal bleed 

## 2014-10-08 NOTE — ED Notes (Signed)
He c/o large melena stool this Sunday with persistent weakness ever since.  [facility], but chose not to wait to see provider.  He arrives in no distress, stating he felt so "weak today I fell down while I was walking".  He denies any injury, nor any discomfort as a result of this fall.  His skin is normal, warm and dry and he is breathing normally.

## 2014-10-08 NOTE — Progress Notes (Signed)
eLink Physician-Brief Progress Note Patient Name: Ryan GradRandy Frazier DOB: 07/15/1961 MRN: [id number]   Date of Service  10/08/2014  HPI/Events of Note  Very agitated. Threatening to leave AMA. EtOH history noted. UDS was positive for benzo's  eICU Interventions  PRN lorazepam ordered     Intervention Category Minor Interventions: Agitation / anxiety - evaluation and management  Ryan FischerDavid Frazier 10/08/2014, 8:35 PM

## 2014-10-08 NOTE — Progress Notes (Signed)
Pt is agitated due to NPO status. He is refusing nursing care. RN was unable to do a full assessment at this time.

## 2014-10-08 NOTE — ED Notes (Signed)
He remains in no distress.  Our intensivist has just seen him and explained that pt. may undergo I.R. procedure today to stop his duodenal bleeding; with which pt. Expresses enthusiastic agreement.

## 2014-10-08 NOTE — ED Provider Notes (Signed)
CSN: [id number]     Arrival date & time 10/08/14  0932 History   First MD Initiated Contact with Patient 10/08/14 [phone]     Chief Complaint  Patient presents with  . GI Bleeding    The history is provided by the patient. No language interpreter was used.   [name] presents for evaluation of GI bleeding.  He is having a lot of dizziness.  No syncope but he is too weak to walk and was found by EMS on the ground because he fell to the ground at the curb.  He did not hit his head.  He had night sweats the other day but no fever.  No abdominal pain.  His stool was black yesterday.  He is vomiting all he eats/drinks and leaves a bad taste in his mouth.  He recently left [facility] ICU AMA because he felt like nothing was being done for him.  Sxs are moderate, constant, worsening.    Past Medical History  Diagnosis Date  . Abdominal pain, other specified site   . GERD (gastroesophageal reflux disease)   . Abnormal CT scan, esophagus 03/2012  . Substance abuse 03/2012    tox screen positive for cocaine.    Past Surgical History  Procedure Laterality Date  . Undescended testicle      on right. noted on 03/2012 CT scan.  no surgery referral.   . Esophagogastroduodenoscopy  08/10/2012    Procedure: ESOPHAGOGASTRODUODENOSCOPY (EGD);  Surgeon: [name], MD;  Location: [facility] ENDOSCOPY;  Service: Endoscopy;  Laterality: N/A;  will be done in room 1   . Laparotomy  08/10/2012    Procedure: EXPLORATORY LAPAROTOMY;  Surgeon: [name], MD;  Location: [facility] OR;  Service: General;  Laterality: N/A;  . Esophagogastroduodenoscopy Left 05/01/2013    Procedure: ESOPHAGOGASTRODUODENOSCOPY (EGD);  Surgeon: Charna , MD;  Location: [facility] ENDOSCOPY;  Service: Endoscopy;  Laterality: Left;  . Esophagogastroduodenoscopy N/A 05/11/2013    Procedure: ESOPHAGOGASTRODUODENOSCOPY (EGD);  Surgeon: [name], MD;  Location: [facility] ENDOSCOPY;  Service: Endoscopy;  Laterality: N/A;  . Esophagogastroduodenoscopy N/A 10/06/2014     Procedure: ESOPHAGOGASTRODUODENOSCOPY (EGD);  Surgeon: [name], MD;  Location: [facility] ENDOSCOPY;  Service: Endoscopy;  Laterality: N/A;   No family history on file. History  Substance Use Topics  . Smoking status: Light Tobacco Smoker -- 1.00 packs/day for 34 years  . Smokeless tobacco: Never Used  . Alcohol Use: Yes     Comment: 40oz beer/day    Review of Systems  All other systems reviewed and are negative.     Allergies  Chocolate; Peanut-containing drug products; and Spinach  Home Medications   Prior to Admission medications   Medication Sig Start Date End Date Taking? Authorizing Provider  HYDROcodone-acetaminophen (NORCO/VICODIN) 5-325 MG per tablet Take 1 tablet by mouth every 4 (four) hours as needed for moderate pain. Patient not taking: Reported on 10/06/2014 02/24/14   Azalia Bilis, MD   BP 102/52 mmHg  Pulse 106  Temp(Src) 98.2 F (36.8 C) (Oral)  Resp 16  SpO2 99% Physical Exam  Constitutional: He is oriented to person, place, and time. He appears well-developed and well-nourished.  HENT:  Head: Normocephalic and atraumatic.  Cardiovascular: Regular rhythm.   No murmur heard. tachycardic  Pulmonary/Chest: Effort normal and breath sounds normal. No respiratory distress.  Abdominal: Soft. There is no tenderness. There is no rebound and no guarding.  Genitourinary:  Black stool, heme positive  Musculoskeletal: He exhibits no edema or tenderness.  Neurological: He is alert and oriented to person, place, and time.  Skin: Skin is warm and dry.  Psychiatric: He has a normal mood and affect. His behavior is normal.  Nursing note and vitals reviewed.   ED Course  Procedures   CRITICAL CARE Performed by: Tilden FossaEES, Ita Fritzsche   Total critical care time: 30 minutes  Critical care time was exclusive of separately billable procedures and treating other patients.  Critical care was necessary to treat or prevent imminent or life-threatening  deterioration.  Critical care was time spent personally by me on the following activities: development of treatment plan with patient and/or surrogate as well as nursing, discussions with consultants, evaluation of patient's response to treatment, examination of patient, obtaining history from patient or surrogate, ordering and performing treatments and interventions, ordering and review of laboratory studies, ordering and review of radiographic studies, pulse oximetry and re-evaluation of patient's condition.  Labs Review Labs Reviewed  CBC - Abnormal; Notable for the following:    WBC 18.6 (*)    RBC 1.70 (*)    Hemoglobin 5.2 (*)    HCT 15.5 (*)    All other components within normal limits  COMPREHENSIVE METABOLIC PANEL - Abnormal; Notable for the following:    Sodium 134 (*)    Potassium 3.4 (*)    Glucose, Bld 138 (*)    BUN 30 (*)    Calcium 7.3 (*)    Total Protein 3.8 (*)    Albumin 2.1 (*)    Alkaline Phosphatase 30 (*)    Total Bilirubin 0.2 (*)    GFR calc non Af Amer 60 (*)    GFR calc Af Amer 69 (*)    All other components within normal limits  MRSA PCR SCREENING  PROTIME-INR  APTT  CBC  CBC  URINE RAPID DRUG SCREEN (HOSP PERFORMED)  POC OCCULT BLOOD, ED  TYPE AND SCREEN  PREPARE RBC (CROSSMATCH)    Imaging Review No results found.   EKG Interpretation None      MDM   Final diagnoses:  Acute upper GI bleed  Acute blood loss anemia    Patient with recent admission for upper GI bleed and left AGAINST MEDICAL ADVICE here for persistent GI bleed with melanotic stools and weakness. Patient with heme positive black stool and persistent low hemoglobin, patient transfused for symptomatically anemia with GI bleed. Discussed with intensivist regarding admission for further management.    Tilden FossaElizabeth Joss Mcdill, MD 10/08/14 1520

## 2014-10-08 NOTE — Sedation Documentation (Addendum)
5Fr sheath removed from right fem artery by Dr. Fredia SorrowYamagata. Hemostasis achieved used exoseal closure device. Manual pressure held by Dr. Fredia SorrowYamagata for 5 minutes. RDP +2. Groin level 0.

## 2014-10-08 NOTE — ED Notes (Signed)
Dr, Madilyn Hookees notified of low hgb.

## 2014-10-08 NOTE — Procedures (Signed)
Procedure:  Celiac arteriography including selective common and right gastric arteriography and SMA arteriography. 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 Right gastric artery identified off of common hepatic artery.  Selective injection shows gastric arterial supply to much of distal stomach and body of stomach along lesser curvature.  No significant duodenal supply identified.  No arterial contrast extravasation or pseudoaneurysm. Right gastric embolization not performed because of marginal benefit and risk of ischemia of stomach.  This could also impair healing if the patient undergoes surgery.  Normal left gastric artery present on celiac injection. SMA arteriography shows no significant pancreaticoduodenal branches and no vessel to embolize empirically.  No complications.  EBL <25 mL

## 2014-10-08 NOTE — Consult Note (Signed)
Referring Provider: Dr Kendrick Fries, [facility] Primary Care Physician:  [name], MD Primary Gastroenterologist:  unassigned  Reason for Consultation:   Upper GI bleed     HPI: [name] is a 54 y.o. male who signed out yesterday from [facility] after being admitted with GI bleed. He has a history of EtOH or substance abuse. He has a history of bleeding duodenal ulcers dating back to 2014. He is status post an upper endoscopy in January 2014 by Dr. Christella Hartigan with Endo clipping of the ulcer, followed by an exploratory lap with oversew of the ulcer. GI bleed with 6 units blood transfusion but left AMA in September 2014. On 05/01/2013 he had an EGD by Dr. [name] with epi injection of a bleeding pulse palpable or so with large CV, duodenitis and sutures noted in the duodenal bulb. He is status post IR embolization of gastroduodenal artery. He bled again 05/11/2013 and had an EGD by Dr. Evette Cristal him with oozing ulcer at the duodenal bulb, which was injected with epi after 2 clips misfired at that time patient fused surgical intervention. He was admitted 10/15/2014 with near syncope again with dark stool the day prior to admission and a blood pressure of 88/54 in the emergency room, hemoglobin 6 and melena. He had an EGD 10/06/2014 by Dr. Rhea Belton and a large vessel was seen in the duodenal bulb. Yesterday while at [facility] he informed the providers that he wanted to leave the hospital. He was advised against this given his bleeding and low hemoglobin, however he left anyways he proceeded to go to [employer] and had a cheeseburger and fries and then later had a beer. He had several lemonades the night but then again began to have melena. He began to have pain in his neck and back and feel dizzy and came back to the emergency room at [facility] today.   Past Medical History  Diagnosis Date  . Abdominal pain, other specified site   . GERD (gastroesophageal reflux disease)   . Abnormal CT scan, esophagus  03/2012  . Substance abuse 03/2012    tox screen positive for cocaine.     Past Surgical History  Procedure Laterality Date  . Undescended testicle      on right. noted on 03/2012 CT scan.  no surgery referral.   . Esophagogastroduodenoscopy  08/10/2012    Procedure: ESOPHAGOGASTRODUODENOSCOPY (EGD);  Surgeon: [name], MD;  Location: [facility] ENDOSCOPY;  Service: Endoscopy;  Laterality: N/A;  will be done in room 1   . Laparotomy  08/10/2012    Procedure: EXPLORATORY LAPAROTOMY;  Surgeon: [name], MD;  Location: [facility] OR;  Service: General;  Laterality: N/A;  . Esophagogastroduodenoscopy Left 05/01/2013    Procedure: ESOPHAGOGASTRODUODENOSCOPY (EGD);  Surgeon: [name], MD;  Location: [facility] ENDOSCOPY;  Service: Endoscopy;  Laterality: Left;  . Esophagogastroduodenoscopy N/A 05/11/2013    Procedure: ESOPHAGOGASTRODUODENOSCOPY (EGD);  Surgeon: [name], MD;  Location: [facility] ENDOSCOPY;  Service: Endoscopy;  Laterality: N/A;  . Esophagogastroduodenoscopy N/A 10/06/2014    Procedure: ESOPHAGOGASTRODUODENOSCOPY (EGD);  Surgeon: [name], MD;  Location: [facility] ENDOSCOPY;  Service: Endoscopy;  Laterality: N/A;    Prior to Admission medications   Medication Sig Start Date End Date Taking? Authorizing Provider  HYDROcodone-acetaminophen (NORCO/VICODIN) 5-325 MG per tablet Take 1 tablet by mouth every 4 (four) hours as needed for moderate pain. Patient not taking: Reported on 10/06/2014 02/24/14   Azalia Bilis, MD    Current Facility-Administered Medications  Medication Dose  Route Frequency Provider Last Rate Last Dose  . 0.9 %  sodium chloride infusion  250 mL Intravenous PRN Simonne Martinet, NP      . 0.9 %  sodium chloride infusion   Intravenous Continuous Simonne Martinet, NP      . pantoprazole (PROTONIX) 80 mg in sodium chloride 0.9 % 250 mL (0.32 mg/mL) infusion  8 mg/hr Intravenous Continuous Tilden Fossa, MD 25 mL/hr at 10/08/14 1107 8 mg/hr at 10/08/14 1107  . [START ON 10/11/2014]  pantoprazole (PROTONIX) injection 40 mg  40 mg Intravenous Q12H Tilden Fossa, MD      . potassium chloride 10 mEq in 100 mL IVPB  10 mEq Intravenous Q1 Hr x 3 Simonne Martinet, NP       Current Outpatient Prescriptions  Medication Sig Dispense Refill  . HYDROcodone-acetaminophen (NORCO/VICODIN) 5-325 MG per tablet Take 1 tablet by mouth every 4 (four) hours as needed for moderate pain. (Patient not taking: Reported on 10/06/2014) 15 tablet 0    Allergies as of 10/08/2014 - Review Complete 10/08/2014  Allergen Reaction Noted  . Chocolate Nausea And Vomiting 04/08/2013  . Peanut-containing drug products Nausea And Vomiting 03/16/2013  . Spinach Nausea And Vomiting 03/16/2013    History reviewed. No pertinent family history.  History   Social History  . Marital Status: Single    Spouse Name: N/A  . Number of Children: N/A  . Years of Education: N/A   Occupational History  . Not on file.   Social History Main Topics  . Smoking status: Light Tobacco Smoker -- 1.00 packs/day for 34 years  . Smokeless tobacco: Never Used  . Alcohol Use: Yes     Comment: 40oz beer/day  . Drug Use: Yes    Special: Marijuana     Comment: occ - "$5 bag" "only when I got money"  . Sexual Activity: No   Other Topics Concern  . Not on file   Social History Narrative    Review of Systems: Gen:c/o weakness and fatigue CV: Denies chest pain, angina, palpitations, syncope, orthopnea, PND, peripheral edema, and claudication. Resp: Denies cough, sputum, wheezing, coughing up blood, and pleurisy. GI: No abd pain. Has melena GU : Denies urinary burning, blood in urine, urinary frequency, urinary hesitancy, nocturnal urination, and urinary incontinence. MS: c/o intermittent neck and back pain Derm: Denies rash, itching, dry skin, hives, moles, warts, or unhealing ulcers.  Psych: Denies depression, anxiety, memory loss, suicidal ideation, hallucinations, paranoia, and confusion. Heme: Denies bruising,  bleeding, and enlarged lymph nodes. Neuro:  Denies any headaches, dizziness, paresthesias. Endo:  Denies any problems with DM, thyroid, adrenal function.  Physical Exam: Vital signs in last 24 hours: Temp:  [97.8 F (36.6 C)-98.9 F (37.2 C)] 98.8 F (37.1 C) (03/09 1227) Pulse Rate:  [101-110] 102 (03/09 1219) Resp:  [16-23] 16 (03/09 1219) BP: (96-146)/(43-64) 122/45 mmHg (03/09 1219) SpO2:  [96 %-100 %] 100 % (03/09 1219)   General:   Alert, cooperative in NAD Head:  Normocephalic and atraumatic. Eyes:  Sclera clear, no icterus. Conjunctiva pink. Ears:  Normal auditory acuity. Nose:  No deformity, discharge or lesions. Mouth:  No deformity or lesions.   Neck:  Supple; no masses or thyromegaly. Lungs:  Clear throughout to auscultation.   No wheezes, crackles, or rhonchi  Heart:  Regular rate and rhythm;1/6 systolic murmur Abdomen:  Soft,nontender, BS active,nonpalp mass or hsm.   Msk:  Symmetrical without gross deformities. . Pulses:  Normal pulses noted. Extremities:  Without  clubbing or edema. Neurologic:  Alert and  oriented x4   Intake/Output from previous day:   Intake/Output this shift: Total I/O In: 830 [I.V.:500; Blood:330] Out: -   Lab Results:  Recent Labs  10/07/14 0218 10/07/14 0700 10/08/14 0954  WBC 12.0* 13.6* 18.6*  HGB 6.3* 6.7* 5.2*  HCT 18.7* 19.1* 15.5*  PLT 128* 111* 168   BMET  Recent Labs  10/05/14 2004 10/05/14 2043 10/06/14 0500 10/08/14 0954  NA 138 138 140 134*  K 4.1 4.0 4.2 3.4*  CL 107 102 114* 108  CO2 27  --  23 20  GLUCOSE 154* 148* 87 138*  BUN 43* 42* 32* 30*  CREATININE 1.52* 1.50* 1.12 1.32  CALCIUM 7.2*  --  6.9* 7.3*   LFT  Recent Labs  10/08/14 0954  PROT 3.8*  ALBUMIN 2.1*  AST 26  ALT 13  ALKPHOS 30*  BILITOT 0.2*   PT/INR  Recent Labs  10/06/14 0651  LABPROT 15.0  INR 1.17   Endoscopies:  EGD 10/06/14:ENDOSCOPIC IMPRESSION: 1. The mucosa of the esophagus appeared normal 2. The mucosa  of the stomach appeared normal 3. Large ulcer with visible vessel was found in the duodenal bulb (see above) RECOMMENDATIONS: 1. Continue PPI drip for another 24 hours, then BID PPI for at least 12 weeks. Closely monitoring for rebleeding (recurrent melena, hemodynamic instability, hematemesis, drop in Hgb). If this occurs would recommend IR for embolization versus surgical consultation. 2. No NSAIDs 3. Check H Pylori status and treat if positive eSigned: Beverley FiedlerJay M Pyrtle, MD 10/06/2014 2:06 PM   IMPRESSION/PLAN: 54 year old male with a history of alcohol abuse and recurrent GI bleed due to ulcers, who [facility], [facility] with melena and near syncope. He is status post endoscopy by Dr. Rhea BeltonPyrtle on March 7 that showed a large nonbleeding and deep ulcer with visible vessel in the duodenal bulb. Hemoglobin on arrival today 5.2 and 2 units of packed red blood cells have been ordered for transfusion. IR has been consulted for definitive management of ulcer re: embolization vs repeat surgery. Surgery in to see pt as well.    Javione Gunawan, Moise BoringLori P PA-C 10/08/2014,  Pager [phone]

## 2014-10-08 NOTE — Progress Notes (Signed)
Subjective: Pt known to our service with recurrent GI bleed back on 08/10/12 with posterior duodenal bleeding ulcer.  He underwent exploratory laparotomy and over sewing of the site at that time by Dr. Lindie SpruceWyatt. He was discharged 08/20/12.  He returned on 04/25/13, with another bleed, intoxicated, confused, combative, HBG of 2.7.  He received 6 units of PRBC,  And was placed on CIWA protocol. He left AM on 04/29/13. He returned on 9/30, complaining of being weak and dizzy with Hemoblobin of 5.7 and got another 4 units of PRBC's. Repeat endoscopy on 05/01/13, by Dr. Loreta AveMann showed:  post-bulbar duodenal ulcer with a large visible vessel that was oozing.   This was injected with epinephrine, and he later underwent embolization of the gastroduodenal artery by IR on 05/04/15. He went home on 05/05/13 and readmitted again 05/11/13 with recurrent bleeding.  He refused surgery at that time. Had another bout of withdrawal symptoms, and was not considered competent to make decisions about his care by Psychiatry.  His most recent readmit was 10/05/14, with hemoglobin of 6.6.  He was readmitted by CCM. Endoscopy by Dr. Rhea BeltonPyrtle on 10/06/14 showed: 2. The mucosa of the stomach appeared normal 3. Large ulcer with visible vessel was found in the duodenal bulb>  IR was recommended at that time.  We saw him at that time and recommended repeat IR embolization also; if that were possible.  He will have a great deal of scarring from prior surgeries, and surgery would not be the first choice.  He signed out of the hospital yesterday, because he was hungry.  Now he returns with a drop in H/H and complaints of dizziness and can't walk.  We are ask to see.  Pt continues to use ETOH, tobacco, and marijuana.  He denies other drug use.   Objective: Vital signs in last 24 hours: Temp:  [97.8 F (36.6 C)-98.9 F (37.2 C)] 98.8 F (37.1 C) (03/09 1227) Pulse Rate:  [101-110] 102 (03/09 1219) Resp:  [16-23] 16 (03/09 1219) BP: (96-146)/(43-64)  122/45 mmHg (03/09 1219) SpO2:  [96 %-100 %] 100 % (03/09 1219)    Intake/Output from previous day:   Intake/Output this shift: Total I/O In: 830 [I.V.:500; Blood:330] Out: -   General appearance: alert, cooperative and no distress Resp: clear to auscultation bilaterally Cardio: regular rate and rhythm, S1, S2 normal, no murmur, click, rub or gallop and tachycardic HR in the 90's at rest.   GI: soft, non-tender; bowel sounds normal; no masses,  no organomegaly and well healed midline incision. Extremities: extremities normal, atraumatic, no cyanosis or edema Neurologic: Grossly normal  Lab Results:   Recent Labs  10/07/14 0700 10/08/14 0954  WBC 13.6* 18.6*  HGB 6.7* 5.2*  HCT 19.1* 15.5*  PLT 111* 168    BMET  Recent Labs  10/06/14 0500 10/08/14 0954  NA 140 134*  K 4.2 3.4*  CL 114* 108  CO2 23 20  GLUCOSE 87 138*  BUN 32* 30*  CREATININE 1.12 1.32  CALCIUM 6.9* 7.3*   PT/INR  Recent Labs  10/06/14 0651  LABPROT 15.0  INR 1.17     Recent Labs Lab 10/05/14 2004 10/08/14 0954  AST 18 26  ALT 11 13  ALKPHOS 26* 30*  BILITOT 0.4 0.2*  PROT 3.5* 3.8*  ALBUMIN 1.9* 2.1*     Lipase     Component Value Date/Time   LIPASE 42 05/27/2013 0900     Studies/Results: No results found.  Medications: . [START ON 10/11/2014]  pantoprazole (PROTONIX) IV  40 mg Intravenous Q12H    Assessment/Plan 1.Recurrent GI bleed; s/p oversewing of duodenal ulcer 08/10/12, and embolization of the gastroduodenal artery 05/03/13. 2. Hx of ETOH abuse/ cocaine use/marijuana use. 3. Psychiatric evaluation: pt did not have capacity 05/01/13. 4. Severe FE deficiency 5. Malnutrition albumin 1.9, total protein, 3.8. 6. Tobacco use 7.  Hypokalemia    Plan:  From our standpoint, the recommendation of IR repeat embolization is still the first line of treatment and our current recommendation. He is being transfused now, and IR will see him shortly.   We will follow with you,  and be available if you should need [place].     LOS: 0 days    Francenia Chimenti 10/08/2014

## 2014-10-08 NOTE — Progress Notes (Signed)
Pt was advised by Dr Tyson AliasFeinstein that if he leaves the hospital against medical advise he may die. He explained the reasoning why the patient had to be NPO at this time and the patient stated that if he was not given something to drink he would leave.

## 2014-10-08 NOTE — ED Notes (Signed)
He is awake and calmly watching TV.

## 2014-10-08 NOTE — Progress Notes (Signed)
  Subjective: Patient known to IR service from prior gastroduodenal artery coil embolization secondary to upper GI bleed in October 2014. He presents again today with recurrent GI bleed with complaints of dizziness and lower extremity weakness. Endoscopy on 10/06/14 revealed normal stomach mucosa but with large ulcer with visible vessel in the duodenal bulb. Most recent hemoglobin is 5.2. He is currently receiving transfusion. Patient does have a history of noncompliance/substance abuse as well as leaving AMA during prior admission. Objective: Vital signs in last 24 hours: Temp:  [97.8 F (36.6 C)-98.9 F (37.2 C)] 98.5 F (36.9 C) (03/09 1415) Pulse Rate:  [97-110] 100 (03/09 1415) Resp:  [16-23] 19 (03/09 1415) BP: (96-146)/(43-70) 131/60 mmHg (03/09 1415) SpO2:  [96 %-100 %] 100 % (03/09 1415) Weight:  [163 lb 2.3 oz (74 kg)] 163 lb 2.3 oz (74 kg) (03/09 1345)    Intake/Output from previous day:   Intake/Output this shift: Total I/O In: 2132.3 [I.V.:520.8; Blood:612.5; IV Piggyback:999] Out: -   Patient is awake, alert and oriented. Chest is clear to auscultation bilaterally. Heart -tachycardic but regular rhythm. Abdomen soft, positive bowel sounds, nontender. Extremities no significant edema.   Lab Results:   Recent Labs  10/07/14 0700 10/08/14 0954  WBC 13.6* 18.6*  HGB 6.7* 5.2*  HCT 19.1* 15.5*  PLT 111* 168   BMET  Recent Labs  10/06/14 0500 10/08/14 0954  NA 140 134*  K 4.2 3.4*  CL 114* 108  CO2 23 20  GLUCOSE 87 138*  BUN 32* 30*  CREATININE 1.12 1.32  CALCIUM 6.9* 7.3*   PT/INR  Recent Labs  10/06/14 0651 10/08/14 1001  LABPROT 15.0 14.7  INR 1.17 1.14   ABG No results for input(s): PHART, HCO3 in the last 72 hours.  Invalid input(s): PCO2, PO2  Studies/Results: No results found.  Anti-infectives: Anti-infectives    None      Assessment/Plan: Patient with known history of duodenal bulb ulceration with prior upper GI hemorrhage  status post exploratory laparotomy and oversewing of the site in 2014. He is status post gastroduodenal artery coil embolization in October 2014. He presents now with recurrent symptomatic GI bleeding with current hemoglobin at 5.2. He is currently receiving transfusion and request now received for repeat visceral arteriogram with possible embolization. Details/risks of procedure, including but not limited to infection, bleeding, worsening renal function, inability to localize source of bleeding and possible need for emergent surgery were discussed with patient with his apparent understanding and consent. Procedure planned for later today.  LOS: 0 days   15 minutes were spent evaluating patient for visceral arteriogram with possible embolization. ALLRED,D [facility] 10/08/2014

## 2014-10-08 NOTE — Sedation Documentation (Signed)
Dr. Fredia SorrowYamagata was paged that patient was ready for procedure at 1550.

## 2014-10-09 ENCOUNTER — Encounter (HOSPITAL_COMMUNITY): Payer: Self-pay | Admitting: *Deleted

## 2014-10-09 ENCOUNTER — Encounter (HOSPITAL_COMMUNITY)
Admission: EM | Disposition: A | Discharge status: Home Health | Payer: Self-pay | Source: Home / Self Care | Attending: Internal Medicine

## 2014-10-09 ENCOUNTER — Inpatient Hospital Stay (HOSPITAL_COMMUNITY): Payer: Medicaid Other

## 2014-10-09 DIAGNOSIS — F1910 Other psychoactive substance abuse, uncomplicated: Secondary | ICD-10-CM

## 2014-10-09 HISTORY — PX: ESOPHAGOGASTRODUODENOSCOPY: SHX5428

## 2014-10-09 LAB — CBC
HCT: 15.7 % — ABNORMAL LOW (ref 39.0–52.0)
HCT: 17.6 % — ABNORMAL LOW (ref 39.0–52.0)
HEMATOCRIT: 18 % — AB (ref 39.0–52.0)
HEMOGLOBIN: 5.9 g/dL — AB (ref 13.0–17.0)
Hemoglobin: 5.3 g/dL — CL (ref 13.0–17.0)
Hemoglobin: 6.3 g/dL — CL (ref 13.0–17.0)
MCH: 30.6 pg (ref 26.0–34.0)
MCH: 31.3 pg (ref 26.0–34.0)
MCH: 30.1 pg (ref 26.0–34.0)
MCHC: 33.8 g/dL (ref 30.0–36.0)
MCHC: 35 g/dL (ref 30.0–36.0)
MCHC: 33.5 g/dL (ref 30.0–36.0)
MCV: 90.8 fL (ref 78.0–100.0)
MCV: 89.6 fL (ref 78.0–100.0)
MCV: 89.8 fL (ref 78.0–100.0)
PLATELETS: 123 10*3/uL — AB (ref 150–400)
Platelets: 123 10*3/uL — ABNORMAL LOW (ref 150–400)
Platelets: 106 10*3/uL — ABNORMAL LOW (ref 150–400)
RBC: 1.73 MIL/uL — AB (ref 4.22–5.81)
RBC: 2.01 MIL/uL — ABNORMAL LOW (ref 4.22–5.81)
RBC: 1.96 MIL/uL — ABNORMAL LOW (ref 4.22–5.81)
RDW: 15.3 % (ref 11.5–15.5)
RDW: 14.7 % (ref 11.5–15.5)
RDW: 15.5 % (ref 11.5–15.5)
WBC: 15.6 10*3/uL — AB (ref 4.0–10.5)
WBC: 16.6 10*3/uL — ABNORMAL HIGH (ref 4.0–10.5)
WBC: 25.4 10*3/uL — ABNORMAL HIGH (ref 4.0–10.5)

## 2014-10-09 LAB — BASIC METABOLIC PANEL
ANION GAP: 0 — AB (ref 5–15)
BUN: 32 mg/dL — AB (ref 6–23)
CALCIUM: 6.7 mg/dL — AB (ref 8.4–10.5)
CO2: 21 mmol/L (ref 19–32)
CREATININE: 1.18 mg/dL (ref 0.50–1.35)
Chloride: 120 mmol/L — ABNORMAL HIGH (ref 96–112)
GFR calc Af Amer: 79 mL/min — ABNORMAL LOW (ref >90)
GFR calc non Af Amer: 68 mL/min — ABNORMAL LOW (ref >90)
GLUCOSE: 170 mg/dL — AB (ref 70–99)
Potassium: 4.2 mmol/L (ref 3.5–5.1)
Sodium: 141 mmol/L (ref 135–145)

## 2014-10-09 LAB — PREPARE RBC (CROSSMATCH)

## 2014-10-09 LAB — BLOOD GAS, ARTERIAL
Acid-base deficit: 5.7 mmol/L — ABNORMAL HIGH (ref 0.0–2.0)
Bicarbonate: 20.1 mEq/L (ref 20.0–24.0)
DRAWN BY: 257701
FIO2: 1 %
MECHVT: 600 mL
O2 SAT: 99.7 %
PATIENT TEMPERATURE: 98.6
PCO2 ART: 44.5 mmHg (ref 35.0–45.0)
PEEP: 5 cmH2O
PH ART: 7.278 — AB (ref 7.350–7.450)
RATE: 15 resp/min
TCO2: 19.8 mmol/L (ref 0–100)
pO2, Arterial: 460 mmHg — ABNORMAL HIGH (ref 80.0–100.0)

## 2014-10-09 SURGERY — EGD (ESOPHAGOGASTRODUODENOSCOPY)

## 2014-10-09 SURGERY — EGD (ESOPHAGOGASTRODUODENOSCOPY)
Anesthesia: Moderate Sedation

## 2014-10-09 MED ORDER — SODIUM CHLORIDE 0.9 % IV SOLN: Freq: Once | INTRAVENOUS | Status: DC

## 2014-10-09 MED ORDER — MIDAZOLAM HCL 2 MG/2ML IJ SOLN
INTRAMUSCULAR | Status: AC
  Administered 2014-10-09: 1 mg
  Filled 2014-10-09: qty 4

## 2014-10-09 MED ORDER — MIDAZOLAM HCL 2 MG/2ML IJ SOLN: 2.0000 mg | INTRAMUSCULAR | Status: DC | PRN

## 2014-10-09 MED ORDER — FENTANYL BOLUS VIA INFUSION
50.0000 ug | INTRAVENOUS | Status: DC | PRN
  Filled 2014-10-09: qty 50

## 2014-10-09 MED ORDER — LIDOCAINE HCL 2 % EX GEL
1.0000 "application " | Freq: Once | CUTANEOUS | Status: AC
  Administered 2014-10-09: 1 via URETHRAL
  Filled 2014-10-09: qty 5

## 2014-10-09 MED ORDER — SODIUM CHLORIDE 0.9 % IV SOLN
25.0000 ug/h | INTRAVENOUS | Status: DC
  Administered 2014-10-09: 25 ug/h via INTRAVENOUS
  Administered 2014-10-09: 75 ug/h via INTRAVENOUS
  Administered 2014-10-09 (×3): 25 ug/h via INTRAVENOUS
  Filled 2014-10-09: qty 50

## 2014-10-09 MED ORDER — NOREPINEPHRINE BITARTRATE 1 MG/ML IV SOLN
0.0000 ug/min | INTRAVENOUS | Status: DC
  Filled 2014-10-09: qty 4

## 2014-10-09 MED ORDER — PROPOFOL 10 MG/ML IV EMUL
INTRAVENOUS | Status: AC
  Filled 2014-10-09: qty 100

## 2014-10-09 MED ORDER — ROCURONIUM BROMIDE 50 MG/5ML IV SOLN
INTRAVENOUS | Status: AC
  Administered 2014-10-09: 50 mg
  Filled 2014-10-09: qty 2

## 2014-10-09 MED ORDER — CHLORHEXIDINE GLUCONATE 0.12 % MT SOLN
15.0000 mL | Freq: Two times a day (BID) | OROMUCOSAL | Status: DC
  Administered 2014-10-09 – 2014-10-10 (×2): 15 mL via OROMUCOSAL
  Filled 2014-10-09 (×2): qty 15

## 2014-10-09 MED ORDER — FENTANYL CITRATE 0.05 MG/ML IJ SOLN
INTRAMUSCULAR | Status: AC
  Administered 2014-10-09: 100 ug
  Filled 2014-10-09: qty 2

## 2014-10-09 MED ORDER — LIDOCAINE HCL (CARDIAC) 20 MG/ML IV SOLN
INTRAVENOUS | Status: AC
  Filled 2014-10-09: qty 5

## 2014-10-09 MED ORDER — ETOMIDATE 2 MG/ML IV SOLN
INTRAVENOUS | Status: AC
  Administered 2014-10-09: 20 mg
  Filled 2014-10-09: qty 20

## 2014-10-09 MED ORDER — PROPOFOL 10 MG/ML IV EMUL
0.0000 ug/kg/min | INTRAVENOUS | Status: DC
  Administered 2014-10-09: 5 ug/kg/min via INTRAVENOUS
  Administered 2014-10-10: 20 ug/kg/min via INTRAVENOUS
  Filled 2014-10-09: qty 100

## 2014-10-09 MED ORDER — SODIUM CHLORIDE 0.9 % IV SOLN
Freq: Once | INTRAVENOUS | Status: AC
  Administered 2014-10-09: 07:00:00 via INTRAVENOUS

## 2014-10-09 MED ORDER — FENTANYL CITRATE 0.05 MG/ML IJ SOLN: 50.0000 ug | Freq: Once | INTRAMUSCULAR | Status: DC

## 2014-10-09 MED ORDER — SUCCINYLCHOLINE CHLORIDE 20 MG/ML IJ SOLN
INTRAMUSCULAR | Status: AC
  Filled 2014-10-09: qty 1

## 2014-10-09 MED ORDER — CETYLPYRIDINIUM CHLORIDE 0.05 % MT LIQD
7.0000 mL | Freq: Four times a day (QID) | OROMUCOSAL | Status: DC
  Administered 2014-10-10 (×2): 7 mL via OROMUCOSAL
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
## 2014-10-09 NOTE — Progress Notes (Signed)
PULMONARY / CRITICAL CARE MEDICINE   Name: [name] MRN: [id number] DOB: 29-Sep-1960    ADMISSION DATE:  10/08/2014 CONSULTATION DATE:  10/08/14  REFERRING MD :  Madilyn Hook   CHIEF COMPLAINT:  Melena, near-syncope  INITIAL PRESENTATION: [name] is a 54 year old man with a history of ETOH abuse and recurrent UGIB due to ulcers who presented to ED 3/9 with melena and near-syncope. Patient was admitted to [facility] 3/6 with same problem and left AMA 3/8 in am.   STUDIES:  3/7 endoscopy -  Large non-bleeding and deep ulcer with visible vessel in duodenal bulb. Normal mucosa of esophagus and stomach.  3/8 left [facility] AMA 3/9 admitted again to [facility], IR angiogram> no vessel feeding duodenum identified  SIGNIFICANT EVENTS: 3/10 overnight passed bright red blood per rectum,   SUBJECTIVE: passed bright red blood per rectum, received 3 U PRBC  VITAL SIGNS: Temp:  [97 F (36.1 C)-99 F (37.2 C)] 98.4 F (36.9 C) (03/10 0930) Pulse Rate:  [85-109] 87 (03/10 1100) Resp:  [13-27] 18 (03/10 1100) BP: (96-136)/(33-73) 123/53 mmHg (03/10 1100) SpO2:  [99 %-100 %] 100 % (03/10 1100) Weight:  [74 kg (163 lb 2.3 oz)-77.9 kg (171 lb 11.8 oz)] 77.9 kg (171 lb 11.8 oz) (03/10 0500) HEMODYNAMICS:   VENTILATOR SETTINGS:  On room air    INTAKE / OUTPUT:  Intake/Output Summary (Last 24 hours) at 10/09/14 1311 Last data filed at 10/09/14 1100  Gross per 24 hour  Intake 4804.41 ml  Output    900 ml  Net 3904.41 ml    PHYSICAL EXAMINATION: General: angry, yelling, talking on cell phone Neuro:  Awake and alert, oriented to date, situation, moves all four ext  HEENT:  NCAT, EOMi Cardiovascular:  RRR, no clear murmur Lungs:  CTA B Abdomen: BS+, soft, tender epigastrum Musculoskeletal:  Normal bulk and tone  Skin:  No rash or skin breakdown  LABS:  CBC  Recent Labs Lab 10/08/14 0954 10/09/14 0013 10/09/14 0140  WBC 18.6* 16.6* 15.6*  HGB 5.2* 6.3* 5.3*  HCT 15.5* 18.0* 15.7*  PLT 168  123* 123*   Coag's  Recent Labs Lab 10/06/14 0651 10/08/14 1001  APTT  --  26  INR 1.17 1.14   BMET  Recent Labs Lab 10/05/14 2004 10/05/14 2043 10/06/14 0500 10/08/14 0954  NA 138 138 140 134*  K 4.1 4.0 4.2 3.4*  CL 107 102 114* 108  CO2 27  --  23 20  BUN 43* 42* 32* 30*  CREATININE 1.52* 1.50* 1.12 1.32  GLUCOSE 154* 148* 87 138*   Electrolytes  Recent Labs Lab 10/05/14 2004 10/06/14 0500 10/08/14 0954  CALCIUM 7.2* 6.9* 7.3*  MG  --  2.0  --   PHOS  --  2.9  --    Sepsis Markers  Recent Labs Lab 10/05/14 2044  LATICACIDVEN 2.91*   ABG No results for input(s): PHART, PCO2ART, PO2ART in the last 168 hours. 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  HEMATOLOGIC A:  Anemia due to acute blood loss  P:  Transfuse 2 more units (this will bring us up to 6), also 2 units FFP Trend CBC q6h VTE prophylaxis - SCDs   NEUROLOGIC/PSYCHE A:   ETOH abuse   H/o polysubstance abuse  History of Axis 1 Psychosis NOS per 2014 and deemed  psyche assessment but not actively hallucinating or delusional on my assessment, he is just profoundly angry, harrassing to staff, and non-engaging with normal conversation.  However he is able to tell me the time and date, situation,  location etc.  He tells me he is willing to discuss the risks and benefits of surgery and endoscopy P:   Monitor for withdrawal Thiamine, folic acid   PULMONARY OETT A: Tobacco abuse  P:   Smoking cessation Monitor O2 saturation  CARDIOVASCULAR A:  Tachycardia due to acute blood loss  Near syncope due to symptomatic anemia  P:  Volume resuscitation  Tele  RENAL A:  No active issues: at risk for AKI due to hypovolemia.  P:   Monitor BMET and UOP Replace electrolytes as needed  INFECTIOUS A:  Leukocytosis without evidence of infection  P:   Monitor for fever  ENDOCRINE A:  No active issues P:   Trend glucose on BMET   FAMILY  - Updates: called brother [name] for update 3/10 AM   TODAY'S SUMMARY: [name] continues to bleed.  He is difficult to deal with but he doesn't appear delusional on my assessment.  Will intubate, sedate, place large bore IV and go ahead w/ procedures.    10/09/2014, 1:11 PM

## 2014-10-09 NOTE — Progress Notes (Signed)
Patient's sister called. RN informed her of the fall that happened earlier this morning.

## 2014-10-09 NOTE — Brief Op Note (Signed)
Formal EGD report to follow tomorrow: Upper endoscopy with control of bleeding Indication: Upper GI bleeding, no duodenal ulcer Medication: Per ICU with propofol and fentanyl infusion  The double channel therapeutic upper endoscope was passed with ease into the esophagus. The esophagus was normal in appearance. The stomach was entered which revealed clotted blood but no evidence for active bleeding from the stomach. Irrigation lavage performed. The pylorus is patulous and in the duodenal bulb is a large cratered ulcer with very large visible vessel. This was oozing but not actively spurting blood. D2 was entered and had fresh blood but no evidence for active bleeding in this portion.  Decision made to apply an Endo Clip x 1 at the neck of the visible vessel which was felt to be an artery. One endoscopic clip placed with success without resultant bleeding.  Continue PPI drip for at least 72 hours Monitor hemoglobin closely and transfuse as needed Formal EGD report to follow with images  If active bleeding overnight would consult IR for repeat angiography

## 2014-10-09 NOTE — Progress Notes (Signed)
Patient fell at 1245. Prior to fall patients blood pressures had been stable systolic 100s diastolic 50s. No signs of bleeding or bloody stools prior to fall. Before fall patient needed to have a bowel movement. The bed pan was offered and he refused. He also refused the bedside commode. An order was in to ambulate as tolerated. Since the patient had shown no signs of a bleed and vital signs had remained stable he was allowed to ambulate to the bathroom. He walked to the bathroom and fell soon after being placed on the toilet. No obvious injuries and no complaints of pain. Vital signs taken, assisted back to bed. MD notified. Will continue to monitor.

## 2014-10-09 NOTE — Progress Notes (Signed)
CRITICAL VALUE ALERT  Critical value received:Hgb 5.9  Date of notification:  10/09/2014  Time of notification:1530  Critical value read back:yes  Nurse who received alert: SDillon RN  MD notified (1st page):  Lori Hvozdovic  Time of first page:  Present on unit and informed  MD notified (2nd page):  Time of second page:  Responding MD:    Time MD responded:

## 2014-10-09 NOTE — Progress Notes (Addendum)
PULMONARY / CRITICAL CARE MEDICINE   Name: [name] MRN: [id number] DOB: July 29, 1961    ADMISSION DATE:  10/08/2014 CONSULTATION DATE:  10/08/14  REFERRING MD :  Madilyn Hook   CHIEF COMPLAINT:  Melena, near-syncope  INITIAL PRESENTATION: [name] is a 54 year old man with a history of ETOH abuse and recurrent UGIB due to ulcers who presented to ED 3/9 with melena and near-syncope. Patient was admitted to [facility] 3/6 with same problem and left AMA 3/8 in am.   STUDIES:  3/7 endoscopy -  Large non-bleeding and deep ulcer with visible vessel in duodenal bulb. Normal mucosa of esophagus and stomach.  3/8 left [facility] AMA 3/9 admitted again to [facility], IR angiogram> no vessel feeding duodenum identified  SIGNIFICANT EVENTS: 3/10 overnight passed bright red blood per rectum,   SUBJECTIVE: passed bright red blood per rectum, received 3 U PRBC  VITAL SIGNS: Temp:  [97 F (36.1 C)-99 F (37.2 C)] 97.8 F (36.6 C) (03/10 0745) Pulse Rate:  [85-110] 85 (03/10 0700) Resp:  [13-27] 19 (03/10 0700) BP: (96-146)/(33-73) 116/53 mmHg (03/10 0700) SpO2:  [96 %-100 %] 100 % (03/10 0700) Weight:  [74 kg (163 lb 2.3 oz)-77.9 kg (171 lb 11.8 oz)] 77.9 kg (171 lb 11.8 oz) (03/10 0500) HEMODYNAMICS:   VENTILATOR SETTINGS:  On room air    INTAKE / OUTPUT:  Intake/Output Summary (Last 24 hours) at 10/09/14 0828 Last data filed at 10/09/14 0800  Gross per 24 hour  Intake 5164.41 ml  Output    900 ml  Net 4264.41 ml    PHYSICAL EXAMINATION: General: angry, yelling, talking on cell phone Neuro:  Awake and alert, oriented to date, situation, moves all four ext  HEENT:  NCAT, EOMi Cardiovascular:  RRR, no clear murmur Lungs:  CTA B Abdomen: BS+, soft, tender epigastrum Musculoskeletal:  Normal bulk and tone  Skin:  No rash or skin breakdown  LABS:  CBC  Recent Labs Lab 10/08/14 0954 10/09/14 0013 10/09/14 0140  WBC 18.6* 16.6* 15.6*  HGB 5.2* 6.3* 5.3*  HCT 15.5* 18.0* 15.7*  PLT 168  123* 123*   Coag's  Recent Labs Lab 10/06/14 0651 10/08/14 1001  APTT  --  26  INR 1.17 1.14   BMET  Recent Labs Lab 10/05/14 2004 10/05/14 2043 10/06/14 0500 10/08/14 0954  NA 138 138 140 134*  K 4.1 4.0 4.2 3.4*  CL 107 102 114* 108  CO2 27  --  23 20  BUN 43* 42* 32* 30*  CREATININE 1.52* 1.50* 1.12 1.32  GLUCOSE 154* 148* 87 138*   Electrolytes  Recent Labs Lab 10/05/14 2004 10/06/14 0500 10/08/14 0954  CALCIUM 7.2* 6.9* 7.3*  MG  --  2.0  --   PHOS  --  2.9  --    Sepsis Markers  Recent Labs Lab 10/05/14 2044  LATICACIDVEN 2.91*   ABG No results for input(s): PHART, PCO2ART, PO2ART in the last 168 hours. 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, perpetuated by polysubstance abuse (cocaine and EtOH)  3/7 endoscopy with visible vessel, high risk for intervention 3/9 IR embolization could not identify a vessel 3/10 bled out overnight, hemoglobin dropping P:   NPO ContinuePantoprazole infusion Transfuse 4th unit of PRBC now See heme See psyche Possible high risk endoscopy today, may need surgical intervention  HEMATOLOGIC A:  Anemia due to acute blood loss  P:  Transfuse PRBCS for Hgb < 8gm/dL Trend CBC [id number] VTE prophylaxis - SCDs   NEUROLOGIC/PSYCHE A:   ETOH abuse   H/o polysubstance abuse  History of Axis 1 Psychosis NOS per 2014 and deemed  psyche assessment but not actively hallucinating or delusional on my assessment, he is just profoundly angry, harrassing to staff, and non-engaging with normal conversation.  However he is able to tell me the time and  date, situation, location etc.  He tells me he is willing to discuss the risks and benefits of surgery and endoscopy P:   Psyche consult today for capacity assessment Monitor for withdrawal Thiamine, folic acid   PULMONARY OETT A: Tobacco abuse  P:   Smoking cessation Monitor O2 saturation  CARDIOVASCULAR A:  Tachycardia due to acute blood loss  Near syncope due to symptomatic anemia  P:  Volume resuscitation  Tele  RENAL A:  No active issues: at risk for AKI due to hypovolemia.  P:   Monitor BMET and UOP Replace electrolytes as needed  INFECTIOUS A:  Leukocytosis without evidence of infection  P:   Monitor for fever  ENDOCRINE A:  No active issues P:   Trend glucose on BMET   FAMILY  - Updates: called brother Nicanor AlconRoger Cullers for update 3/10 AM   TODAY'S SUMMARY: Mr. [name] continues to bleed.  He is difficult to deal with but he doesn't appear delusional on my assessment.  May need repeat endoscopy and or surgery today as there seems to be little other options.    My CC time coordinating emergency procedures with other services and guiding resuscitation 40 minutes  Heber CarolinaBrent Ava Deguire, MD [place] [facility] Pager: [phone] Cell: [phone] If no response, call [phone]   10/09/2014, 8:28 AM

## 2014-10-09 NOTE — Procedures (Signed)
Intubation Procedure Note Ryan GradRandy Frazier [id number] 01/18/1961  Procedure: Intubation Indications: Airway protection and maintenance  Procedure Details Consent: Risks of procedure as well as the alternatives and risks of each were explained to the (patient/caregiver).  Consent for procedure obtained. Time Out: Verified patient identification, verified procedure, site/side was marked, verified correct patient position, special equipment/implants available, medications/allergies/relevent history reviewed, required imaging and test results available.  Performed  Maximum sterile technique was used including antiseptics, cap, gloves, gown, hand hygiene and mask.  MAC  3 glide scope. Would use a 4 glide scope if intubated in future    Evaluation Hemodynamic Status: BP stable throughout; O2 sats: stable throughout Patient's Current Condition: stable Complications: No apparent complications Patient did tolerate procedure well. Chest X-ray ordered to verify placement.  CXR: pending.   BABCOCK,PETE 10/09/2014  Attending:  I was immediately present for and supervised the procedure  Heber CarolinaBrent McQuaid, MD [place] [facility] Pager: [phone] Cell: [phone] If no response, call [phone]

## 2014-10-09 NOTE — Progress Notes (Signed)
Attempted to insert foley catheter, tried #16 core temp foley and #14 foley but was unable to get into bladder. Dr Sung AmabileSimonds notified, will get urology consult.  Bladder scanned and around 360 ml urine present.

## 2014-10-09 NOTE — Progress Notes (Addendum)
Patient ID: [name], male   DOB: [birth year], 54 y.o.   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 The left and right gastric arteries were patent. Selective arteriography of the right gastric artery demonstrated no significant duodenal supply. Embolization of the right gastric artery was not performed. Patient now scheduled for intubation prior to EGD and if necessary possible repeat celiac arteriogram with right gastric artery embolization. Details/risks of repeat arteriogram with possible embolization again discussed with patient with his understanding and consent. Right common femoral artery puncture site clean, dry, no hematoma. Filed Vitals:   10/09/14 1100  BP: 123/53  Pulse: 87  Temp:   Resp: 18   Results for orders placed or performed during the hospital encounter of 10/08/14  MRSA PCR Screening  Result Value Ref Range   MRSA by PCR NEGATIVE NEGATIVE  CBC  Result Value Ref Range   WBC 18.6 (H) 4.0 - 10.5 K/uL   RBC 1.70 (L) 4.22 - 5.81 MIL/uL   Hemoglobin 5.2 (LL) 13.0 - 17.0 g/dL   HCT 40.9 (L) 81.1 - 91.4 %   MCV 91.2 78.0 - 100.0 fL   MCH 30.6 26.0 - 34.0 pg   MCHC 33.5 30.0 - 36.0 g/dL   RDW 78.2 95.6 - 21.3 %   Platelets 168 150 - 400 K/uL  Comprehensive metabolic panel  Result Value Ref Range   Sodium 134 (L) 135 - 145 mmol/L   Potassium 3.4 (L) 3.5 - 5.1 mmol/L   Chloride 108 96 - 112 mmol/L   CO2 20 19 - 32 mmol/L   Glucose, Bld 138 (H) 70 - 99 mg/dL   BUN 30 (H) 6 - 23 mg/dL   Creatinine, Ser 0.86 0.50 - 1.35 mg/dL   Calcium 7.3 (L) 8.4 - 10.5 mg/dL   Total Protein 3.8 (L) 6.0 - 8.3 g/dL   Albumin 2.1 (L) 3.5 - 5.2 g/dL   AST 26 0 - 37 U/L   ALT 13 0 - 53 U/L   Alkaline Phosphatase 30 (L) 39 - 117 U/L   Total Bilirubin 0.2 (L) 0.3 - 1.2 mg/dL   GFR calc non Af Amer 60 (L) >90 mL/min   GFR calc Af Amer 69 (L) >90 mL/min   Anion gap 6 5 - 15  CBC   Result Value Ref Range   WBC 16.6 (H) 4.0 - 10.5 K/uL   RBC 2.01 (L) 4.22 - 5.81 MIL/uL   Hemoglobin 6.3 (LL) 13.0 - 17.0 g/dL   HCT 57.8 (L) 46.9 - 62.9 %   MCV 89.6 78.0 - 100.0 fL   MCH 31.3 26.0 - 34.0 pg   MCHC 35.0 30.0 - 36.0 g/dL   RDW 52.8 41.3 - 24.4 %   Platelets 123 (L) 150 - 400 K/uL  Protime-INR  Result Value Ref Range   Prothrombin Time 14.7 11.6 - 15.2 seconds   INR 1.14 0.00 - 1.49  APTT  Result Value Ref Range   aPTT 26 24 - 37 seconds  Urine rapid drug screen (hosp performed)  Result Value Ref Range   Opiates NONE DETECTED NONE DETECTED   Cocaine POSITIVE (A) NONE DETECTED   Benzodiazepines POSITIVE (A) NONE DETECTED   Amphetamines NONE DETECTED NONE DETECTED   Tetrahydrocannabinol NONE DETECTED NONE DETECTED   Barbiturates NONE DETECTED NONE DETECTED  CBC  Result Value Ref Range   WBC 15.6 (H) 4.0 - 10.5 K/uL  RBC 1.73 (L) 4.22 - 5.81 MIL/uL   Hemoglobin 5.3 (LL) 13.0 - 17.0 g/dL   HCT 19.115.7 (L) 47.839.0 - 29.552.0 %   MCV 90.8 78.0 - 100.0 fL   MCH 30.6 26.0 - 34.0 pg   MCHC 33.8 30.0 - 36.0 g/dL   RDW 62.115.3 30.811.5 - 65.715.5 %   Platelets 123 (L) 150 - 400 K/uL  Type and screen  Result Value Ref Range   ABO/RH(D) O NEG    Antibody Screen NEG    Sample Expiration 10/11/2014    Unit Number [id number]    Blood Component Type RBC LR PHER2    Unit division 00    Status of Unit ISSUED,FINAL    Transfusion Status OK TO TRANSFUSE    Crossmatch Result COMPATIBLE    Unit Number [id number]    Blood Component Type RED CELLS,LR    Unit division 00    Status of Unit ISSUED,FINAL    Transfusion Status OK TO TRANSFUSE    Crossmatch Result COMPATIBLE    Unit Number [id number]    Blood Component Type RBC LR PHER1    Unit division 00    Status of Unit ISSUED    Transfusion Status OK TO TRANSFUSE    Crossmatch Result COMPATIBLE    Unit Number [id number]    Blood Component Type RED CELLS,LR    Unit division 00    Status of Unit ISSUED    Transfusion  Status OK TO TRANSFUSE    Crossmatch Result COMPATIBLE   Prepare RBC  Result Value Ref Range   Order Confirmation ORDER PROCESSED BY BLOOD BANK   Prepare RBC  Result Value Ref Range   Order Confirmation ORDER PROCESSED BY BLOOD BANK   A/P: 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 He is status post gastroduodenal artery coil embolization in October 2014. He presents now with recurrent symptomatic GI bleeding .  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 Right gastric artery embolization was not performed because of marginal benefit and risk of ischemia of stomach. SMA arteriography showed no significant pancreaticoduodenal branches and no vessel to embolize empirically. Patient continues to have rectal bleeding today and is now scheduled for intubation followed by EGD. If necessary, patient may have to go undergo additional celiac arteriography today with embolization of the right gastric artery. Patient is aware of the above plans and has consented to follow-up arteriography / possible embolization if needed.  Jeananne RamaKevin Allred, PAC

## 2014-10-09 NOTE — Consult Note (Signed)
[facility] Face-to-Face Psychiatry Consult   Reason for Consult:  Capacity evaluation and history of alcohol and polysubstance abuse Referring Physician:  Dr. Kendrick Fries Patient Identification: [name] MRN:  [id number] Principal Diagnosis: Polysubstance abuse Diagnosis:   Patient Active Problem List   Diagnosis Date Noted  . UGIB (upper gastrointestinal bleed) [K92.2] 10/08/2014  . Acute post-hemorrhagic anemia [D62] 10/08/2014  . Duodenal ulcer hemorrhage [K26.4]   . Acute upper GI bleed [K92.2]   . GI bleed [K92.2] 10/05/2014  . Leukocytosis [D72.829] 04/30/2013  . Microcytic anemia [D50.9] 04/30/2013  . Anemia [D64.9] 04/25/2013  . Anemia due to GI blood loss [D50.0] 08/10/2012  . Acute respiratory failure [J96.00] 08/10/2012  . Hypertension [I10] 08/10/2012  . Status post laparotomy with oversew of bleeding duodenal artery [Z98.89] 08/10/2012  . Duodenal ulcer [K26.9] 08/10/2012  . Encephalopathy acute [G93.40] 08/10/2012    Total Time spent with patient: 45 minutes  Subjective:   [name] is a 54 y.o. male patient admitted with UGIB.  HPI:  [name] is a 54 year old man seen, chart reviewed and case discussed with Unk Lightning LCSW for psychiatric consultation and evaluation of capacity evaluation. Patient reported that he has been staying with her house mates and has been drinking, smoking tobacco and occasionally cocaine. Patient reportedly admitted to [facility] intensive care unit secondary to upper gastrointestinal bleeding. Patient minimizes his symptoms of substance abuse versus an dependence, withdrawal symptoms and cravings. Patient reportedly has no recent detox treatment or rehabilitation services. Patient denies current symptoms of depression, anxiety, mania and psychosis. Patient has no evidence of suicidal or homicidal ideation, intention or plans. Patient reportedly had a abdominal surgery several years ago and stated that he may need another surgery to control  his stomach pain. Patient has intact cognitions including orientation, concentration, memory and fund of knowledge. Patient also elevated off his current medical problems and agrees with the recommended treatments.  Medical history: Patient with a history of ETOH abuse, polysubstance abuse, and recurrent UGIB due to ulcers who presented to [facility] ED 3/9 with melena, weakness, and near-syncope. Patient was admitted to [facility] 3/6 with same and left AMA 3/8 in am. Patient reported bleeding beginning again pm 3/8. In 2014 he was admitted 1/9-1/20 for UGI bleed and subsequent exploratory laparotomy and posterior duodenal oversew. Had recurrent bleed October 2014, had IR embolization of GDA with recurrent bleeding 2 weeks later not requiring intervention.    Past Medical History:  Past Medical History  Diagnosis Date  . Abdominal pain, other specified site   . GERD (gastroesophageal reflux disease)   . Abnormal CT scan, esophagus 03/2012  . Substance abuse 03/2012    tox screen positive for cocaine.     Past Surgical History  Procedure Laterality Date  . Undescended testicle      on right. noted on 03/2012 CT scan.  no surgery referral.   . Esophagogastroduodenoscopy  08/10/2012    Procedure: ESOPHAGOGASTRODUODENOSCOPY (EGD);  Surgeon: [name], MD;  Location: [facility] ENDOSCOPY;  Service: Endoscopy;  Laterality: N/A;  will be done in room 1   . Laparotomy  08/10/2012    Procedure: EXPLORATORY LAPAROTOMY;  Surgeon: [name], MD;  Location: [facility] OR;  Service: General;  Laterality: N/A;  . Esophagogastroduodenoscopy Left 05/01/2013    Procedure: ESOPHAGOGASTRODUODENOSCOPY (EGD);  Surgeon: [name], MD;  Location: [facility] ENDOSCOPY;  Service: Endoscopy;  Laterality: Left;  . Esophagogastroduodenoscopy N/A 05/11/2013    Procedure: ESOPHAGOGASTRODUODENOSCOPY (EGD);  Surgeon: Gordan Payment  Evette CristalGanem, MD;  [facility];  Service: Endoscopy;  Laterality: N/A;  . Esophagogastroduodenoscopy N/A  10/06/2014    Procedure: ESOPHAGOGASTRODUODENOSCOPY (EGD);  Surgeon: Beverley FiedlerJay M Pyrtle, MD;  [facility];  Service: Endoscopy;  Laterality: N/A;   Family History: History reviewed. No pertinent family history. Social History:  History  Alcohol Use  . Yes    Comment: 40oz beer/day     History  Drug Use  . Yes  . Special: Marijuana    Comment: occ - "$5 bag" "only when I got money"    History   Social History  . Marital Status: Single    Spouse Name: N/A  . Number of Children: N/A  . Years of Education: N/A   Social History Main Topics  . Smoking status: Light Tobacco Smoker -- 1.00 packs/day for 34 years  . Smokeless tobacco: Never Used  . Alcohol Use: Yes     Comment: 40oz beer/day  . Drug Use: Yes    Special: Marijuana     Comment: occ - "$5 bag" "only when I got money"  . Sexual Activity: No   Other Topics Concern  . None   Social History Narrative   Additional Social History:                          Allergies:   Allergies  Allergen Reactions  . Chocolate Nausea And Vomiting  . Peanut-Containing Drug Products Nausea And Vomiting  . Spinach Nausea And Vomiting    Vitals: Blood pressure 102/42, pulse 100, temperature 97.8 F (36.6 C), temperature source Oral, resp. rate 21, height 5\' 9"  (1.753 m), weight 77.9 kg (171 lb 11.8 oz), SpO2 100 %.  Risk to Self: Is patient at risk for suicide?: No Risk to Others:   Prior Inpatient Therapy:   Prior Outpatient Therapy:    Current Facility-Administered Medications  Medication Dose Route Frequency Provider Last Rate Last Dose  . 0.9 %  sodium chloride infusion  250 mL Intravenous PRN Simonne MartinetPeter E Babcock, NP 10 mL/hr at 10/09/14 0917 250 mL at 10/09/14 0917  . 0.9 %  sodium chloride infusion   Intravenous Continuous Simonne MartinetPeter E Babcock, NP 125 mL/hr at 10/09/14 0100    . LORazepam (ATIVAN) injection 0.5-1 mg  0.5-1 mg Intravenous Q4H PRN Merwyn Katosavid B Simonds, MD      . pantoprazole (PROTONIX) 80 mg in sodium  chloride 0.9 % 250 mL (0.32 mg/mL) infusion  8 mg/hr Intravenous Continuous Tilden FossaElizabeth Rees, MD 25 mL/hr at 10/09/14 0818 8 mg/hr at 10/09/14 0818  . [START ON 10/11/2014] pantoprazole (PROTONIX) injection 40 mg  40 mg Intravenous Q12H Tilden FossaElizabeth Rees, MD        Musculoskeletal: Strength & Muscle Tone: within normal limits Gait & Station: unsteady Patient leans: N/A  Psychiatric Specialty Exam: Physical Exam as per history and physical   ROS abdominal pain, near syncopal episode secondary to generalized weakness   Blood pressure 102/42, pulse 100, temperature 97.8 F (36.6 C), temperature source Oral, resp. rate 21, height 5\' 9"  (1.753 m), weight 77.9 kg (171 lb 11.8 oz), SpO2 100 %.Body mass index is 25.35 kg/(m^2).  General Appearance: Disheveled  Eye Contact::  Good  Speech:  Clear and Coherent and Slow  Volume:  Decreased  Mood:  Euthymic  Affect:  Appropriate and Congruent  Thought Process:  Coherent and Goal Directed  Orientation:  Full (Time, Place, and Person)  Thought Content:  WDL  Suicidal Thoughts:  No  Homicidal Thoughts:  No  Memory:  Immediate;   Good Recent;   Good  Judgement:  Intact  Insight:  Fair  Psychomotor Activity:  Decreased  Concentration:  Good  Recall:  Good  Fund of Knowledge:Good  Language: Good  Akathisia:  Negative  Handed:  Right  AIMS (if indicated):     Assets:  Communication Skills Desire for Improvement Financial Resources/Insurance Housing Intimacy Leisure Time Resilience Social Support Talents/Skills  ADL's:  Impaired  Cognition: WNL  Sleep:      Medical Decision Making: Review of Psycho-Social Stressors (1), Review or order clinical lab tests (1), Established Problem, Worsening (2), Review or order medicine tests (1), Review of Medication Regimen & Side Effects (2) and Review of New Medication or Change in Dosage (2)  Treatment Plan Summary: Daily contact with patient to assess and evaluate symptoms and progress in treatment  and Medication management  Plan:  Patient has capacity to make his own medical decisions and living arrangements based on my evaluation today.  Patient does not meet criteria for psychiatric inpatient admission. Supportive therapy provided about ongoing stressors.   Disposition: Patient will be referred to the outpatient medication management when medically stable  Myrah Strawderman,JANARDHAHA R. 10/09/2014 9:56 AM

## 2014-10-09 NOTE — Progress Notes (Signed)
eLink Physician-Brief Progress Note Patient Name: Ryan GradRandy Vowles DOB: 04/24/1961 MRN: [id number]   Date of Service  10/09/2014  HPI/Events of Note  Pt with BRBPR, was up to the bathroom and had a fall. Back to bed with relative hypotension. Hgb 6.3  eICU Interventions  PRBC ordered     Intervention Category Intermediate Interventions: Bleeding - evaluation and treatment with blood products  Sutton Plake S. 10/09/2014, 2:05 AM

## 2014-10-09 NOTE — Progress Notes (Signed)
CRITICAL VALUE ALERT  Critical value received:  Hgb 5.3  Date of notification:  10/09/14  Time of notification:  0400  Critical value read back:Yes.    Nurse who received alert:  Iran OuchBecky Davis  MD notified (1st page):  Byrum  Time of first page:  [phone]  MD notified (2nd page):  Time of second page:  Responding MD:  Delton CoombesByrum  Time MD responded:  [phone]

## 2014-10-09 NOTE — Procedures (Signed)
Central Venous Cordis w/ triple lumen Catheter Insertion Procedure Note Ryan GradRandy Frazier [id number] 06/08/1961  Procedure: Insertion of Central Venous Catheter Indications: Assessment of intravascular volume, Drug and/or fluid administration and Frequent blood sampling  Procedure Details Consent: Risks of procedure as well as the alternatives and risks of each were explained to the (patient/caregiver).  Consent for procedure obtained. Time Out: Verified patient identification, verified procedure, site/side was marked, verified correct patient position, special equipment/implants available, medications/allergies/relevent history reviewed, required imaging and test results available.  Performed Real time US was used to ID and cannulate vessel  Maximum sterile technique was used including antiseptics, cap, gloves, gown, hand hygiene, mask and sheet. Skin prep: Chlorhexidine; local anesthetic administered A antimicrobial bonded/coated quadruple lumen (cordis w/ triple lumen) catheter was placed in the right internal jugular vein using the Seldinger technique.  Evaluation Blood flow good Complications: No apparent complications Patient did tolerate procedure well. Chest X-ray ordered to verify placement.  CXR: pending.  Tamsin Nader,PETE 10/09/2014, 1:59 PM

## 2014-10-09 NOTE — Progress Notes (Signed)
Subjective: We were ask to see again and discuss possible surgical intervention with him.   Currently the plan is to intubate;   Dr. Rhea Belton will repeat EGD and attempt to control thru the endoscope.   If this fails Dr. Fredia Sorrow from IR will attempt to control with IR embolization.  If that fails we will plan to take him  to the OR for surgical intervention.  I have discussed this with him and talked with his sister on his phone.   Complications include:  Death;  inability to control the bleeding.  Infection, possible injury to other organs, poor healing secondary to malnutrition and ischemia from prior bleeding control treatments.  Possible fistula formation, possible colostomy or ileostomy of some type.  I discussed this with his sister who was on his phone, and explained the issues in the room with him so he could hear it all a second time, while talking to his sister on his phone. His sister wanted me to talk to another family member, but pt insisted I give him the phone back and conversation was ended. I have ask the staff to get consent signed before they intubate him.  Objective: Vital signs in last 24 hours: Temp:  [97 F (36.1 C)-99 F (37.2 C)] 98.4 F (36.9 C) (03/10 0930) Pulse Rate:  [85-109] 87 (03/10 1100) Resp:  [13-27] 18 (03/10 1100) BP: (96-136)/(33-73) 123/53 mmHg (03/10 1100) SpO2:  [99 %-100 %] 100 % (03/10 1100) Weight:  [163 lb 2.3 oz (74 kg)-171 lb 11.8 oz (77.9 kg)] 171 lb 11.8 oz (77.9 kg) (03/10 0500) Last BM Date:  (pta)  Intake/Output from previous day: 03/09 0701 - 03/10 0700 In: 5154.4 [I.V.:2892.9; Blood:962.5; IV Piggyback:1299] Out: 900 [Urine:900] Intake/Output this shift: Total I/O In: 480 [I.V.:85; Blood:365; Other:30] Out: -   General appearance: alert and more cooperative than this AM.  Lab Results:   Recent Labs  10/09/14 0013 10/09/14 0140  WBC 16.6* 15.6*  HGB 6.3* 5.3*  HCT 18.0* 15.7*  PLT 123* 123*    BMET  Recent Labs   10/08/14 0954  NA 134*  K 3.4*  CL 108  CO2 20  GLUCOSE 138*  BUN 30*  CREATININE 1.32  CALCIUM 7.3*   PT/INR  Recent Labs  10/08/14 1001  LABPROT 14.7  INR 1.14     Recent Labs Lab 10/05/14 2004 10/08/14 0954  AST 18 26  ALT 11 13  ALKPHOS 26* 30*  BILITOT 0.4 0.2*  PROT 3.5* 3.8*  ALBUMIN 1.9* 2.1*     Lipase     Component Value Date/Time   LIPASE 42 05/27/2013 0900     Studies/Results: Ir Angiogram Visceral Selective  10/08/2014   CLINICAL DATA:  Large ulcer of the duodenal bulb with bleeding and significant anemia requiring transfusion. History of prior ulceration in this area requiring arteriography and transcatheter embolization of the gastroduodenal artery in October, 2014.  EXAM: 1. ULTRASOUND GUIDANCE FOR VASCULAR ACCESS OF THE RIGHT COMMON FEMORAL ARTERY 2. FIRST ORDER SELECTIVE ARTERIOGRAPHY OF THE CELIAC AXIS 3. SECOND ORDER SELECTIVE ARTERIOGRAPHY OF THE COMMON HEPATIC ARTERY 4. THIRD ORDER SELECTIVE ARTERIOGRAPHY OF THE RIGHT GASTRIC ARTERY 5. FIRST ORDER SELECTIVE ARTERIOGRAPHY OF THE SUPERIOR MESENTERIC ARTERY  FLUOROSCOPY TIME:  10 minutes and 6 seconds.  MEDICATIONS AND MEDICAL HISTORY: 2.0 mg IV Versed; 75 mcg IV Fentanyl.  ANESTHESIA/SEDATION: Moderate sedation time: 46 minutes  CONTRAST:  82 ml Omnipaque-300  PROCEDURE: The procedure, risks, benefits, and alternatives were explained to the patient. Questions  regarding the procedure were encouraged and answered. The patient understands and consents to the procedure.  The right groin was prepped with Betadine in a sterile fashion, and a sterile drape was applied covering the operative field. A sterile gown and sterile gloves were used for the procedure. Local anesthesia was provided with 1% Lidocaine. Prior to the procedure a timeout was performed. Ultrasound was used to confirm patency of the right common femoral artery.  Access of the right common femoral artery was performed under ultrasound guidance with  a micropuncture set. Ultrasound image documentation was performed.A 5-French sheath was placed. A 5-French Cobra catheter was advanced and used to selectively catheterize the celiac axis. Selective arteriography was performed.  The Cobra catheter was further advanced into the common hepatic artery and selective arteriography performed. A Renegade micro catheter was then advanced through the Cobra catheter a used to selectively catheterize the right gastric artery. Arteriography was performed.  The superior mesenteric artery was catheterized with a 5 [place] Sos catheter. Selective arteriography was performed.  The catheter was removed. Femoral puncture site was assessed with oblique arteriography. Arteriotomy closure was performed with the Cordis ExoSeal device.  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 A left gastric artery is identified which is patent and also supplies accessory left lobe hepatic supply. The splenic artery is normally patent.  A right gastric artery was identified emanating from the lateral aspect of the proper hepatic artery just beyond the gastroduodenal artery. This was catheterized and selective arteriography demonstrates supply to the mid and distal stomach along the lesser curvature. No definite duodenal supply is identified and there is no evidence of arterial contrast extravasation or pseudoaneurysm. Based on distribution of right gastric artery supply as well as the level of the endoscopically confirmed duodenum bulb ulcer, it was felt that embolization of the right gastric artery would not be of benefit in controlling bleeding and would potentially make portions of the stomach ischemic.  Selective arteriography of the superior mesenteric artery demonstrates no evidence of significant pancreaticoduodenal branches supplying the duodenum. No arterial extravasation or pseudoaneurysm is  identified. Branches supplying small bowel and the proximal colon appear normal.  Arteriotomy closure was initially successful in establishing hemostasis.  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 Patent left and right gastric arteries are demonstrated. Selective arteriography of the right gastric artery demonstrated no significant duodenal supply. Embolization of the right gastric artery was not performed. No significant pancreaticoduodenal branches were identified from SMA supply.   Electronically Signed   By: Irish Lack M.D.   On: 10/08/2014 17:45   Ir Angiogram Visceral Selective  10/08/2014   CLINICAL DATA:  Large ulcer of the duodenal bulb with bleeding and significant anemia requiring transfusion. History of prior ulceration in this area requiring arteriography and transcatheter embolization of the gastroduodenal artery in October, 2014.  EXAM: 1. ULTRASOUND GUIDANCE FOR VASCULAR ACCESS OF THE RIGHT COMMON FEMORAL ARTERY 2. FIRST ORDER SELECTIVE ARTERIOGRAPHY OF THE CELIAC AXIS 3. SECOND ORDER SELECTIVE ARTERIOGRAPHY OF THE COMMON HEPATIC ARTERY 4. THIRD ORDER SELECTIVE ARTERIOGRAPHY OF THE RIGHT GASTRIC ARTERY 5. FIRST ORDER SELECTIVE ARTERIOGRAPHY OF THE SUPERIOR MESENTERIC ARTERY  FLUOROSCOPY TIME:  10 minutes and 6 seconds.  MEDICATIONS AND MEDICAL HISTORY: 2.0 mg IV Versed; 75 mcg IV Fentanyl.  ANESTHESIA/SEDATION: Moderate sedation time: 46 minutes  CONTRAST:  82 ml Omnipaque-300  PROCEDURE: The procedure, risks, benefits, and alternatives were  explained to the patient. Questions regarding the procedure were encouraged and answered. The patient understands and consents to the procedure.  The right groin was prepped with Betadine in a sterile fashion, and a sterile drape was applied covering the operative field. A sterile gown and sterile gloves were used for the procedure. Local anesthesia was provided with 1% Lidocaine. Prior  to the procedure a timeout was performed. Ultrasound was used to confirm patency of the right common femoral artery.  Access of the right common femoral artery was performed under ultrasound guidance with a micropuncture set. Ultrasound image documentation was performed.A 5-French sheath was placed. A 5-French Cobra catheter was advanced and used to selectively catheterize the celiac axis. Selective arteriography was performed.  The Cobra catheter was further advanced into the common hepatic artery and selective arteriography performed. A Renegade micro catheter was then advanced through the Cobra catheter a used to selectively catheterize the right gastric artery. Arteriography was performed.  The superior mesenteric artery was catheterized with a 5 [place] Sos catheter. Selective arteriography was performed.  The catheter was removed. Femoral puncture site was assessed with oblique arteriography. Arteriotomy closure was performed with the Cordis ExoSeal device.  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 A left gastric artery is identified which is patent and also supplies accessory left lobe hepatic supply. The splenic artery is normally patent.  A right gastric artery was identified emanating from the lateral aspect of the proper hepatic artery just beyond the gastroduodenal artery. This was catheterized and selective arteriography demonstrates supply to the mid and distal stomach along the lesser curvature. No definite duodenal supply is identified and there is no evidence of arterial contrast extravasation or pseudoaneurysm. Based on distribution of right gastric artery supply as well as the level of the endoscopically confirmed duodenum bulb ulcer, it was felt that embolization of the right gastric artery would not be of benefit in controlling bleeding and would potentially make portions of the stomach  ischemic.  Selective arteriography of the superior mesenteric artery demonstrates no evidence of significant pancreaticoduodenal branches supplying the duodenum. No arterial extravasation or pseudoaneurysm is identified. Branches supplying small bowel and the proximal colon appear normal.  Arteriotomy closure was initially successful in establishing hemostasis.  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 Patent left and right gastric arteries are demonstrated. Selective arteriography of the right gastric artery demonstrated no significant duodenal supply. Embolization of the right gastric artery was not performed. No significant pancreaticoduodenal branches were identified from SMA supply.   Electronically Signed   By: Irish Lack M.D.   On: 10/08/2014 17:45   Ir Angiogram Selective Each Additional Vessel  10/08/2014   CLINICAL DATA:  Large ulcer of the duodenal bulb with bleeding and significant anemia requiring transfusion. History of prior ulceration in this area requiring arteriography and transcatheter embolization of the gastroduodenal artery in October, 2014.  EXAM: 1. ULTRASOUND GUIDANCE FOR VASCULAR ACCESS OF THE RIGHT COMMON FEMORAL ARTERY 2. FIRST ORDER SELECTIVE ARTERIOGRAPHY OF THE CELIAC AXIS 3. SECOND ORDER SELECTIVE ARTERIOGRAPHY OF THE COMMON HEPATIC ARTERY 4. THIRD ORDER SELECTIVE ARTERIOGRAPHY OF THE RIGHT GASTRIC ARTERY 5. FIRST ORDER SELECTIVE ARTERIOGRAPHY OF THE SUPERIOR MESENTERIC ARTERY  FLUOROSCOPY TIME:  10 minutes and 6 seconds.  MEDICATIONS AND MEDICAL HISTORY: 2.0 mg IV Versed; 75 mcg IV Fentanyl.  ANESTHESIA/SEDATION: Moderate sedation time: 46 minutes  CONTRAST:  82 ml Omnipaque-300  PROCEDURE:  The procedure, risks, benefits, and alternatives were explained to the patient. Questions regarding the procedure were encouraged and answered. The patient understands and consents to the procedure.  The right groin was  prepped with Betadine in a sterile fashion, and a sterile drape was applied covering the operative field. A sterile gown and sterile gloves were used for the procedure. Local anesthesia was provided with 1% Lidocaine. Prior to the procedure a timeout was performed. Ultrasound was used to confirm patency of the right common femoral artery.  Access of the right common femoral artery was performed under ultrasound guidance with a micropuncture set. Ultrasound image documentation was performed.A 5-French sheath was placed. A 5-French Cobra catheter was advanced and used to selectively catheterize the celiac axis. Selective arteriography was performed.  The Cobra catheter was further advanced into the common hepatic artery and selective arteriography performed. A Renegade micro catheter was then advanced through the Cobra catheter a used to selectively catheterize the right gastric artery. Arteriography was performed.  The superior mesenteric artery was catheterized with a 5 [place] Sos catheter. Selective arteriography was performed.  The catheter was removed. Femoral puncture site was assessed with oblique arteriography. Arteriotomy closure was performed with the Cordis ExoSeal device.  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 A left gastric artery is identified which is patent and also supplies accessory left lobe hepatic supply. The splenic artery is normally patent.  A right gastric artery was identified emanating from the lateral aspect of the proper hepatic artery just beyond the gastroduodenal artery. This was catheterized and selective arteriography demonstrates supply to the mid and distal stomach along the lesser curvature. No definite duodenal supply is identified and there is no evidence of arterial contrast extravasation or pseudoaneurysm. Based on distribution of right gastric artery supply as well as the  level of the endoscopically confirmed duodenum bulb ulcer, it was felt that embolization of the right gastric artery would not be of benefit in controlling bleeding and would potentially make portions of the stomach ischemic.  Selective arteriography of the superior mesenteric artery demonstrates no evidence of significant pancreaticoduodenal branches supplying the duodenum. No arterial extravasation or pseudoaneurysm is identified. Branches supplying small bowel and the proximal colon appear normal.  Arteriotomy closure was initially successful in establishing hemostasis.  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 Patent left and right gastric arteries are demonstrated. Selective arteriography of the right gastric artery demonstrated no significant duodenal supply. Embolization of the right gastric artery was not performed. No significant pancreaticoduodenal branches were identified from SMA supply.   Electronically Signed   By: Irish Lack M.D.   On: 10/08/2014 17:45   Ir Angiogram Selective Each Additional Vessel  10/08/2014   CLINICAL DATA:  Large ulcer of the duodenal bulb with bleeding and significant anemia requiring transfusion. History of prior ulceration in this area requiring arteriography and transcatheter embolization of the gastroduodenal artery in October, 2014.  EXAM: 1. ULTRASOUND GUIDANCE FOR VASCULAR ACCESS OF THE RIGHT COMMON FEMORAL ARTERY 2. FIRST ORDER SELECTIVE ARTERIOGRAPHY OF THE CELIAC AXIS 3. SECOND ORDER SELECTIVE ARTERIOGRAPHY OF THE COMMON HEPATIC ARTERY 4. THIRD ORDER SELECTIVE ARTERIOGRAPHY OF THE RIGHT GASTRIC ARTERY 5. FIRST ORDER SELECTIVE ARTERIOGRAPHY OF THE SUPERIOR MESENTERIC ARTERY  FLUOROSCOPY TIME:  10 minutes and 6 seconds.  MEDICATIONS AND MEDICAL HISTORY: 2.0 mg IV Versed; 75 mcg IV Fentanyl.  ANESTHESIA/SEDATION: Moderate sedation time: 46 minutes  CONTRAST:  82 ml Omnipaque-300  PROCEDURE: The  procedure, risks, benefits, and alternatives were explained to the patient. Questions regarding the procedure were encouraged and answered. The patient understands and consents to the procedure.  The right groin was prepped with Betadine in a sterile fashion, and a sterile drape was applied covering the operative field. A sterile gown and sterile gloves were used for the procedure. Local anesthesia was provided with 1% Lidocaine. Prior to the procedure a timeout was performed. Ultrasound was used to confirm patency of the right common femoral artery.  Access of the right common femoral artery was performed under ultrasound guidance with a micropuncture set. Ultrasound image documentation was performed.A 5-French sheath was placed. A 5-French Cobra catheter was advanced and used to selectively catheterize the celiac axis. Selective arteriography was performed.  The Cobra catheter was further advanced into the common hepatic artery and selective arteriography performed. A Renegade micro catheter was then advanced through the Cobra catheter a used to selectively catheterize the right gastric artery. Arteriography was performed.  The superior mesenteric artery was catheterized with a 5 [place] Sos catheter. Selective arteriography was performed.  The catheter was removed. Femoral puncture site was assessed with oblique arteriography. Arteriotomy closure was performed with the Cordis ExoSeal device.  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 A left gastric artery is identified which is patent and also supplies accessory left lobe hepatic supply. The splenic artery is normally patent.  A right gastric artery was identified emanating from the lateral aspect of the proper hepatic artery just beyond the gastroduodenal artery. This was catheterized and selective arteriography demonstrates supply to the mid and distal stomach  along the lesser curvature. No definite duodenal supply is identified and there is no evidence of arterial contrast extravasation or pseudoaneurysm. Based on distribution of right gastric artery supply as well as the level of the endoscopically confirmed duodenum bulb ulcer, it was felt that embolization of the right gastric artery would not be of benefit in controlling bleeding and would potentially make portions of the stomach ischemic.  Selective arteriography of the superior mesenteric artery demonstrates no evidence of significant pancreaticoduodenal branches supplying the duodenum. No arterial extravasation or pseudoaneurysm is identified. Branches supplying small bowel and the proximal colon appear normal.  Arteriotomy closure was initially successful in establishing hemostasis.  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 Patent left and right gastric arteries are demonstrated. Selective arteriography of the right gastric artery demonstrated no significant duodenal supply. Embolization of the right gastric artery was not performed. No significant pancreaticoduodenal branches were identified from SMA supply.   Electronically Signed   By: Irish LackGlenn  Yamagata M.D.   On: 10/08/2014 17:45   Ir [place] Guide Vasc Access Right  10/08/2014   CLINICAL DATA:  Large ulcer of the duodenal bulb with bleeding and significant anemia requiring transfusion. History of prior ulceration in this area requiring arteriography and transcatheter embolization of the gastroduodenal artery in October, 2014.  EXAM: 1. ULTRASOUND GUIDANCE FOR VASCULAR ACCESS OF THE RIGHT COMMON FEMORAL ARTERY 2. FIRST ORDER SELECTIVE ARTERIOGRAPHY OF THE CELIAC AXIS 3. SECOND ORDER SELECTIVE ARTERIOGRAPHY OF THE COMMON HEPATIC ARTERY 4. THIRD ORDER SELECTIVE ARTERIOGRAPHY OF THE RIGHT GASTRIC ARTERY 5. FIRST ORDER SELECTIVE ARTERIOGRAPHY OF THE SUPERIOR MESENTERIC ARTERY  FLUOROSCOPY TIME:  10  minutes and 6 seconds.  MEDICATIONS AND MEDICAL HISTORY: 2.0 mg IV Versed; 75 mcg IV Fentanyl.  ANESTHESIA/SEDATION: Moderate sedation time: 46 minutes  CONTRAST:  82 ml Omnipaque-300  PROCEDURE: The procedure, risks, benefits, and alternatives were explained to the patient. Questions regarding the procedure were encouraged and answered. The patient understands and consents to the procedure.  The right groin was prepped with Betadine in a sterile fashion, and a sterile drape was applied covering the operative field. A sterile gown and sterile gloves were used for the procedure. Local anesthesia was provided with 1% Lidocaine. Prior to the procedure a timeout was performed. Ultrasound was used to confirm patency of the right common femoral artery.  Access of the right common femoral artery was performed under ultrasound guidance with a micropuncture set. Ultrasound image documentation was performed.A 5-French sheath was placed. A 5-French Cobra catheter was advanced and used to selectively catheterize the celiac axis. Selective arteriography was performed.  The Cobra catheter was further advanced into the common hepatic artery and selective arteriography performed. A Renegade micro catheter was then advanced through the Cobra catheter a used to selectively catheterize the right gastric artery. Arteriography was performed.  The superior mesenteric artery was catheterized with a 5 [place] Sos catheter. Selective arteriography was performed.  The catheter was removed. Femoral puncture site was assessed with oblique arteriography. Arteriotomy closure was performed with the Cordis ExoSeal device.  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 A left gastric artery is identified which is patent and also supplies accessory left lobe hepatic supply. The splenic artery is normally patent.  A right gastric artery was identified  emanating from the lateral aspect of the proper hepatic artery just beyond the gastroduodenal artery. This was catheterized and selective arteriography demonstrates supply to the mid and distal stomach along the lesser curvature. No definite duodenal supply is identified and there is no evidence of arterial contrast extravasation or pseudoaneurysm. Based on distribution of right gastric artery supply as well as the level of the endoscopically confirmed duodenum bulb ulcer, it was felt that embolization of the right gastric artery would not be of benefit in controlling bleeding and would potentially make portions of the stomach ischemic.  Selective arteriography of the superior mesenteric artery demonstrates no evidence of significant pancreaticoduodenal branches supplying the duodenum. No arterial extravasation or pseudoaneurysm is identified. Branches supplying small bowel and the proximal colon appear normal.  Arteriotomy closure was initially successful in establishing hemostasis.  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 Patent left and right gastric arteries are demonstrated. Selective arteriography of the right gastric artery demonstrated no significant duodenal supply. Embolization of the right gastric artery was not performed. No significant pancreaticoduodenal branches were identified from SMA supply.   Electronically Signed   By: Irish Lack M.D.   On: 10/08/2014 17:45    Medications: . sodium chloride   Intravenous Once  . etomidate      . fentaNYL      . lidocaine (cardiac) 100 mg/57ml      . midazolam      . [START ON 10/11/2014] pantoprazole (PROTONIX) IV  40 mg Intravenous Q12H  . rocuronium      . succinylcholine        Assessment/Plan 1.Recurrent GI bleed; s/p oversewing of duodenal ulcer 08/10/12, and embolization of the gastroduodenal artery 05/03/13. 2. Hx of ETOH abuse/ cocaine use/marijuana use. 3.  Psychiatric evaluation: pt did not have capacity 05/01/13. 4. Severe FE deficiency 5. Malnutrition albumin 1.9, total protein,  3.8. 6. Tobacco use 7. Hypokalemia  8. Cocaine positive drug screen  Plan:  Pt will be intubated with plans for Endoscopic evaluation and possible treatment. If this fails IR and Dr. Fredia Sorrow will be on standby for possible embolization as a form of control.   If both endoscopic and IR fail to control bleeding, we will take to the OR for exploratory laparotomy, and attempt to control the bleeding.  Risk and benefits discussed in detail above.        LOS: 1 day    Brya Simerly 10/09/2014

## 2014-10-09 NOTE — Progress Notes (Signed)
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 Patent left and right gastric arteries are demonstrated. Selective arteriography of the right gastric artery demonstrated no significant duodenal supply. Embolization of the right gastric artery was not performed. No significant pancreaticoduodenal branches were identified from SMA supply.Hgb 5.3. Receiving more prbcs. (curently on 4th unit)Pt reportedly agitated last pm and threatened to leave AMA again. Discussed EGD with pt, and risks involved.Discussed need for immediate surgical backup may be needed with pt, and he seems to understand. He expresses frustration and asks why this wasn't done at [facility] earlier in the week, and he was reminded that he signed out AMA. Pt expresses that he realizes that was a mistake and he would like to proceed with "whatever you people need to do to stop this bleeding".   Objective:  Vital signs in last 24 hours: Temp:  [97 F (36.1 C)-99 F (37.2 C)] 97.8 F (36.6 C) (03/10 0745) Pulse Rate:  [85-110] 85 (03/10 0700) Resp:  [13-27] 19 (03/10 0700) BP: (96-146)/(33-73) 116/53 mmHg (03/10 0700) SpO2:  [96 %-100 %] 100 % (03/10 0700) Weight:  [163 lb 2.3 oz (74 kg)-171 lb 11.8 oz (77.9 kg)] 171 lb 11.8 oz (77.9 kg) (03/10 0500) Last BM Date:  (pta) General:   Alert,  Ill appearing black male in NAD Heart:  Regular rate and rhythm; 1/6 systolic murmur Pulm;lungs clear Abdomen:  Soft, nontender, + bowel sounds, without guarding, and without rebound.   Extremities:  Without edema. Neurologic:  Alert and  oriented x4   Intake/Output from previous day: 03/09 0701 - 03/10 0700 In: 5154.4 [I.V.:2892.9; Blood:962.5; IV Piggyback:1299] Out: 900 [Urine:900] Intake/Output this shift: Total I/O In: 10  [I.V.:10] Out: -   Lab Results:  Recent Labs  10/08/14 0954 10/09/14 0013 10/09/14 0140  WBC 18.6* 16.6* 15.6*  HGB 5.2* 6.3* 5.3*  HCT 15.5* 18.0* 15.7*  PLT 168 123* 123*   BMET  Recent Labs  10/08/14 0954  NA 134*  K 3.4*  CL 108  CO2 20  GLUCOSE 138*  BUN 30*  CREATININE 1.32  CALCIUM 7.3*   LFT  Recent Labs  10/08/14 0954  PROT 3.8*  ALBUMIN 2.1*  AST 26  ALT 13  ALKPHOS 30*  BILITOT 0.2*   PT/INR  Recent Labs  10/08/14 1001  LABPROT 14.7  INR 1.14      Ir Angiogram Visceral Selective  10/08/2014   CLINICAL DATA:  Large ulcer of the duodenal bulb with bleeding and significant anemia requiring transfusion. History of prior ulceration in this area requiring arteriography and transcatheter embolization of the gastroduodenal artery in October, 2014.  EXAM: 1. ULTRASOUND GUIDANCE FOR VASCULAR ACCESS OF THE RIGHT COMMON FEMORAL ARTERY 2. FIRST ORDER SELECTIVE ARTERIOGRAPHY OF THE CELIAC AXIS 3. SECOND ORDER SELECTIVE ARTERIOGRAPHY OF THE COMMON HEPATIC ARTERY 4. THIRD ORDER SELECTIVE ARTERIOGRAPHY OF THE RIGHT GASTRIC ARTERY 5. FIRST ORDER SELECTIVE ARTERIOGRAPHY OF THE SUPERIOR MESENTERIC ARTERY  FLUOROSCOPY TIME:  10 minutes and 6 seconds.  MEDICATIONS AND MEDICAL HISTORY: 2.0 mg IV Versed; 75 mcg IV Fentanyl.  ANESTHESIA/SEDATION: Moderate sedation time: 46 minutes  CONTRAST:  82 ml Omnipaque-300  PROCEDURE: The procedure, risks, benefits, and alternatives were explained to the patient. Questions regarding the procedure were encouraged and answered. The patient understands and consents to the procedure.  The right groin was prepped with Betadine in a sterile fashion, and a sterile drape was applied covering the operative field. A sterile gown and sterile gloves were used for the procedure. Local anesthesia was provided with 1% Lidocaine. Prior to the procedure a timeout was performed. Ultrasound was used to confirm patency of the right common femoral artery.   Access of the right common femoral artery was performed under ultrasound guidance with a micropuncture set. Ultrasound image documentation was performed.A 5-French sheath was placed. A 5-French Cobra catheter was advanced and used to selectively catheterize the celiac axis. Selective arteriography was performed.  The Cobra catheter was further advanced into the common hepatic artery and selective arteriography performed. A Renegade micro catheter was then advanced through the Cobra catheter a used to selectively catheterize the right gastric artery. Arteriography was performed.  The superior mesenteric artery was catheterized with a 5 [place] Sos catheter. Selective arteriography was performed.  The catheter was removed. Femoral puncture site was assessed with oblique arteriography. Arteriotomy closure was performed with the Cordis ExoSeal device.  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 A left gastric artery is identified which is patent and also supplies accessory left lobe hepatic supply. The splenic artery is normally patent.  A right gastric artery was identified emanating from the lateral aspect of the proper hepatic artery just beyond the gastroduodenal artery. This was catheterized and selective arteriography demonstrates supply to the mid and distal stomach along the lesser curvature. No definite duodenal supply is identified and there is no evidence of arterial contrast extravasation or pseudoaneurysm. Based on distribution of right gastric artery supply as well as the level of the endoscopically confirmed duodenum bulb ulcer, it was felt that embolization of the right gastric artery would not be of benefit in controlling bleeding and would potentially make portions of the stomach ischemic.  Selective arteriography of the superior mesenteric artery demonstrates no evidence of significant  pancreaticoduodenal branches supplying the duodenum. No arterial extravasation or pseudoaneurysm is identified. Branches supplying small bowel and the proximal colon appear normal.  Arteriotomy closure was initially successful in establishing hemostasis.  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 Patent left and right gastric arteries are demonstrated. Selective arteriography of the right gastric artery demonstrated no significant duodenal supply. Embolization of the right gastric artery was not performed. No significant pancreaticoduodenal branches were identified from SMA supply.   Electronically Signed   By: Irish Lack M.D.   On: 10/08/2014 17:45   Ir Angiogram Visceral Selective  10/08/2014   CLINICAL DATA:  Large ulcer of the duodenal bulb with bleeding and significant anemia requiring transfusion. History of prior ulceration in this area requiring arteriography and transcatheter embolization of the gastroduodenal artery in October, 2014.  EXAM: 1. ULTRASOUND GUIDANCE FOR VASCULAR ACCESS OF THE RIGHT COMMON FEMORAL ARTERY 2. FIRST ORDER SELECTIVE ARTERIOGRAPHY OF THE CELIAC AXIS 3. SECOND ORDER SELECTIVE ARTERIOGRAPHY OF THE COMMON HEPATIC ARTERY 4. THIRD ORDER SELECTIVE ARTERIOGRAPHY OF THE RIGHT GASTRIC ARTERY 5. FIRST ORDER SELECTIVE ARTERIOGRAPHY OF THE SUPERIOR MESENTERIC ARTERY  FLUOROSCOPY TIME:  10 minutes and 6 seconds.  MEDICATIONS AND MEDICAL HISTORY: 2.0 mg IV Versed; 75 mcg IV Fentanyl.  ANESTHESIA/SEDATION: Moderate sedation time: 46 minutes  CONTRAST:  82 ml Omnipaque-300  PROCEDURE: The procedure, risks, benefits, and alternatives were explained to the patient. Questions regarding the procedure were encouraged and answered. The patient understands and  consents to the procedure.  The right groin was prepped with Betadine in a sterile fashion, and a sterile drape was applied covering the operative field. A sterile gown and  sterile gloves were used for the procedure. Local anesthesia was provided with 1% Lidocaine. Prior to the procedure a timeout was performed. Ultrasound was used to confirm patency of the right common femoral artery.  Access of the right common femoral artery was performed under ultrasound guidance with a micropuncture set. Ultrasound image documentation was performed.A 5-French sheath was placed. A 5-French Cobra catheter was advanced and used to selectively catheterize the celiac axis. Selective arteriography was performed.  The Cobra catheter was further advanced into the common hepatic artery and selective arteriography performed. A Renegade micro catheter was then advanced through the Cobra catheter a used to selectively catheterize the right gastric artery. Arteriography was performed.  The superior mesenteric artery was catheterized with a 5 [place] Sos catheter. Selective arteriography was performed.  The catheter was removed. Femoral puncture site was assessed with oblique arteriography. Arteriotomy closure was performed with the Cordis ExoSeal device.  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 A left gastric artery is identified which is patent and also supplies accessory left lobe hepatic supply. The splenic artery is normally patent.  A right gastric artery was identified emanating from the lateral aspect of the proper hepatic artery just beyond the gastroduodenal artery. This was catheterized and selective arteriography demonstrates supply to the mid and distal stomach along the lesser curvature. No definite duodenal supply is identified and there is no evidence of arterial contrast extravasation or pseudoaneurysm. Based on distribution of right gastric artery supply as well as the level of the endoscopically confirmed duodenum bulb ulcer, it was felt that embolization of the right gastric artery would  not be of benefit in controlling bleeding and would potentially make portions of the stomach ischemic.  Selective arteriography of the superior mesenteric artery demonstrates no evidence of significant pancreaticoduodenal branches supplying the duodenum. No arterial extravasation or pseudoaneurysm is identified. Branches supplying small bowel and the proximal colon appear normal.  Arteriotomy closure was initially successful in establishing hemostasis.  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 Patent left and right gastric arteries are demonstrated. Selective arteriography of the right gastric artery demonstrated no significant duodenal supply. Embolization of the right gastric artery was not performed. No significant pancreaticoduodenal branches were identified from SMA supply.   Electronically Signed   By: Irish Lack M.D.   On: 10/08/2014 17:45   Ir Angiogram Selective Each Additional Vessel  10/08/2014   CLINICAL DATA:  Large ulcer of the duodenal bulb with bleeding and significant anemia requiring transfusion. History of prior ulceration in this area requiring arteriography and transcatheter embolization of the gastroduodenal artery in October, 2014.  EXAM: 1. ULTRASOUND GUIDANCE FOR VASCULAR ACCESS OF THE RIGHT COMMON FEMORAL ARTERY 2. FIRST ORDER SELECTIVE ARTERIOGRAPHY OF THE CELIAC AXIS 3. SECOND ORDER SELECTIVE ARTERIOGRAPHY OF THE COMMON HEPATIC ARTERY 4. THIRD ORDER SELECTIVE ARTERIOGRAPHY OF THE RIGHT GASTRIC ARTERY 5. FIRST ORDER SELECTIVE ARTERIOGRAPHY OF THE SUPERIOR MESENTERIC ARTERY  FLUOROSCOPY TIME:  10 minutes and 6 seconds.  MEDICATIONS AND MEDICAL HISTORY: 2.0 mg IV Versed; 75 mcg IV Fentanyl.  ANESTHESIA/SEDATION: Moderate sedation time: 46 minutes  CONTRAST:  82 ml Omnipaque-300  PROCEDURE: The procedure, risks, benefits, and alternatives were explained to the patient. Questions regarding the procedure were  encouraged and answered. The patient understands and consents to the procedure.  The right groin was prepped with Betadine in a sterile fashion, and a sterile drape was applied covering the operative field. A sterile gown and sterile gloves were used for the procedure. Local anesthesia was provided with 1% Lidocaine. Prior to the procedure a timeout was performed. Ultrasound was used to confirm patency of the right common femoral artery.  Access of the right common femoral artery was performed under ultrasound guidance with a micropuncture set. Ultrasound image documentation was performed.A 5-French sheath was placed. A 5-French Cobra catheter was advanced and used to selectively catheterize the celiac axis. Selective arteriography was performed.  The Cobra catheter was further advanced into the common hepatic artery and selective arteriography performed. A Renegade micro catheter was then advanced through the Cobra catheter a used to selectively catheterize the right gastric artery. Arteriography was performed.  The superior mesenteric artery was catheterized with a 5 [place] Sos catheter. Selective arteriography was performed.  The catheter was removed. Femoral puncture site was assessed with oblique arteriography. Arteriotomy closure was performed with the Cordis ExoSeal device.  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 A left gastric artery is identified which is patent and also supplies accessory left lobe hepatic supply. The splenic artery is normally patent.  A right gastric artery was identified emanating from the lateral aspect of the proper hepatic artery just beyond the gastroduodenal artery. This was catheterized and selective arteriography demonstrates supply to the mid and distal stomach along the lesser curvature. No definite duodenal supply is identified and there is no evidence of arterial contrast  extravasation or pseudoaneurysm. Based on distribution of right gastric artery supply as well as the level of the endoscopically confirmed duodenum bulb ulcer, it was felt that embolization of the right gastric artery would not be of benefit in controlling bleeding and would potentially make portions of the stomach ischemic.  Selective arteriography of the superior mesenteric artery demonstrates no evidence of significant pancreaticoduodenal branches supplying the duodenum. No arterial extravasation or pseudoaneurysm is identified. Branches supplying small bowel and the proximal colon appear normal.  Arteriotomy closure was initially successful in establishing hemostasis.  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 Patent left and right gastric arteries are demonstrated. Selective arteriography of the right gastric artery demonstrated no significant duodenal supply. Embolization of the right gastric artery was not performed. No significant pancreaticoduodenal branches were identified from SMA supply.   Electronically Signed   By: Irish Lack M.D.   On: 10/08/2014 17:45   Ir Angiogram Selective Each Additional Vessel  10/08/2014   CLINICAL DATA:  Large ulcer of the duodenal bulb with bleeding and significant anemia requiring transfusion. History of prior ulceration in this area requiring arteriography and transcatheter embolization of the gastroduodenal artery in October, 2014.  EXAM: 1. ULTRASOUND GUIDANCE FOR VASCULAR ACCESS OF THE RIGHT COMMON FEMORAL ARTERY 2. FIRST ORDER SELECTIVE ARTERIOGRAPHY OF THE CELIAC AXIS 3. SECOND ORDER SELECTIVE ARTERIOGRAPHY OF THE COMMON HEPATIC ARTERY 4. THIRD ORDER SELECTIVE ARTERIOGRAPHY OF THE RIGHT GASTRIC ARTERY 5. FIRST ORDER SELECTIVE ARTERIOGRAPHY OF THE SUPERIOR MESENTERIC ARTERY  FLUOROSCOPY TIME:  10 minutes and 6 seconds.  MEDICATIONS AND MEDICAL HISTORY: 2.0 mg IV Versed; 75 mcg IV Fentanyl.   ANESTHESIA/SEDATION: Moderate sedation time: 46 minutes  CONTRAST:  82 ml Omnipaque-300  PROCEDURE: The procedure, risks, benefits, and alternatives were explained to  the patient. Questions regarding the procedure were encouraged and answered. The patient understands and consents to the procedure.  The right groin was prepped with Betadine in a sterile fashion, and a sterile drape was applied covering the operative field. A sterile gown and sterile gloves were used for the procedure. Local anesthesia was provided with 1% Lidocaine. Prior to the procedure a timeout was performed. Ultrasound was used to confirm patency of the right common femoral artery.  Access of the right common femoral artery was performed under ultrasound guidance with a micropuncture set. Ultrasound image documentation was performed.A 5-French sheath was placed. A 5-French Cobra catheter was advanced and used to selectively catheterize the celiac axis. Selective arteriography was performed.  The Cobra catheter was further advanced into the common hepatic artery and selective arteriography performed. A Renegade micro catheter was then advanced through the Cobra catheter a used to selectively catheterize the right gastric artery. Arteriography was performed.  The superior mesenteric artery was catheterized with a 5 [place] Sos catheter. Selective arteriography was performed.  The catheter was removed. Femoral puncture site was assessed with oblique arteriography. Arteriotomy closure was performed with the Cordis ExoSeal device.  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 A left gastric artery is identified which is patent and also supplies accessory left lobe hepatic supply. The splenic artery is normally patent.  A right gastric artery was identified emanating from the lateral aspect of the proper hepatic artery just beyond the gastroduodenal  artery. This was catheterized and selective arteriography demonstrates supply to the mid and distal stomach along the lesser curvature. No definite duodenal supply is identified and there is no evidence of arterial contrast extravasation or pseudoaneurysm. Based on distribution of right gastric artery supply as well as the level of the endoscopically confirmed duodenum bulb ulcer, it was felt that embolization of the right gastric artery would not be of benefit in controlling bleeding and would potentially make portions of the stomach ischemic.  Selective arteriography of the superior mesenteric artery demonstrates no evidence of significant pancreaticoduodenal branches supplying the duodenum. No arterial extravasation or pseudoaneurysm is identified. Branches supplying small bowel and the proximal colon appear normal.  Arteriotomy closure was initially successful in establishing hemostasis.  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 Patent left and right gastric arteries are demonstrated. Selective arteriography of the right gastric artery demonstrated no significant duodenal supply. Embolization of the right gastric artery was not performed. No significant pancreaticoduodenal branches were identified from SMA supply.   Electronically Signed   By: Irish LackGlenn  Yamagata M.D.   On: 10/08/2014 17:45   Ir [place] Guide Vasc Access Right  10/08/2014   CLINICAL DATA:  Large ulcer of the duodenal bulb with bleeding and significant anemia requiring transfusion. History of prior ulceration in this area requiring arteriography and transcatheter embolization of the gastroduodenal artery in October, 2014.  EXAM: 1. ULTRASOUND GUIDANCE FOR VASCULAR ACCESS OF THE RIGHT COMMON FEMORAL ARTERY 2. FIRST ORDER SELECTIVE ARTERIOGRAPHY OF THE CELIAC AXIS 3. SECOND ORDER SELECTIVE ARTERIOGRAPHY OF THE COMMON HEPATIC ARTERY 4. THIRD ORDER SELECTIVE ARTERIOGRAPHY OF THE RIGHT  GASTRIC ARTERY 5. FIRST ORDER SELECTIVE ARTERIOGRAPHY OF THE SUPERIOR MESENTERIC ARTERY  FLUOROSCOPY TIME:  10 minutes and 6 seconds.  MEDICATIONS AND MEDICAL HISTORY: 2.0 mg IV Versed; 75 mcg IV Fentanyl.  ANESTHESIA/SEDATION: Moderate sedation time: 46 minutes  CONTRAST:  82 ml Omnipaque-300  PROCEDURE: The procedure,  risks, benefits, and alternatives were explained to the patient. Questions regarding the procedure were encouraged and answered. The patient understands and consents to the procedure.  The right groin was prepped with Betadine in a sterile fashion, and a sterile drape was applied covering the operative field. A sterile gown and sterile gloves were used for the procedure. Local anesthesia was provided with 1% Lidocaine. Prior to the procedure a timeout was performed. Ultrasound was used to confirm patency of the right common femoral artery.  Access of the right common femoral artery was performed under ultrasound guidance with a micropuncture set. Ultrasound image documentation was performed.A 5-French sheath was placed. A 5-French Cobra catheter was advanced and used to selectively catheterize the celiac axis. Selective arteriography was performed.  The Cobra catheter was further advanced into the common hepatic artery and selective arteriography performed. A Renegade micro catheter was then advanced through the Cobra catheter a used to selectively catheterize the right gastric artery. Arteriography was performed.  The superior mesenteric artery was catheterized with a 5 [place] Sos catheter. Selective arteriography was performed.  The catheter was removed. Femoral puncture site was assessed with oblique arteriography. Arteriotomy closure was performed with the Cordis ExoSeal device.  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 A left gastric artery is identified which is patent and also supplies  accessory left lobe hepatic supply. The splenic artery is normally patent.  A right gastric artery was identified emanating from the lateral aspect of the proper hepatic artery just beyond the gastroduodenal artery. This was catheterized and selective arteriography demonstrates supply to the mid and distal stomach along the lesser curvature. No definite duodenal supply is identified and there is no evidence of arterial contrast extravasation or pseudoaneurysm. Based on distribution of right gastric artery supply as well as the level of the endoscopically confirmed duodenum bulb ulcer, it was felt that embolization of the right gastric artery would not be of benefit in controlling bleeding and would potentially make portions of the stomach ischemic.  Selective arteriography of the superior mesenteric artery demonstrates no evidence of significant pancreaticoduodenal branches supplying the duodenum. No arterial extravasation or pseudoaneurysm is identified. Branches supplying small bowel and the proximal colon appear normal.  Arteriotomy closure was initially successful in establishing hemostasis.  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 Patent left and right gastric arteries are demonstrated. Selective arteriography of the right gastric artery demonstrated no significant duodenal supply. Embolization of the right gastric artery was not performed. No significant pancreaticoduodenal branches were identified from SMA supply.   Electronically Signed   By: Irish Lack M.D.   On: 10/08/2014 17:45    ASSESSMENT/PLAN:  54 yo male with known duodenal bulb ulcer with large vessel. As per prior EGD at [facility] earlier this week, due to location, size and chronicity, it was felt this ulcer was not amenable to endoscopic therapy. Pt continues to bleed. Pt  Was not agreeable to surgical intervention earlier this a.m, but has changed his mind.Pt reviewed  with Dr Rhea Belton. Plan is for EGD at bedside later today with critical care support ( likely will need sedation/intubation). It has been explained to pt that this is a risky procedure, and that surgical back up is needed in case surgery needed to stop bleeding. Will contact surgery .     LOS: 1 day   Mateo Overbeck, Moise Boring 10/09/2014, Pager [phone]

## 2014-10-09 NOTE — Progress Notes (Signed)
Subjective: Pt angry and hostile.  He would not talk with [place] just started yelling, angry that he did not get something done yesterday.  Cussing at [place].  It is Dr. Ermalene Searing opinion that he does not want our services.  Objective: Vital signs in last 24 hours: Temp:  [97 F (36.1 C)-99 F (37.2 C)] 97.8 F (36.6 C) (03/10 0745) Pulse Rate:  [87-110] 92 (03/10 0648) Resp:  [13-27] 15 (03/10 0648) BP: (96-146)/(33-73) 119/56 mmHg (03/10 0648) SpO2:  [96 %-100 %] 100 % (03/10 0648) Weight:  [163 lb 2.3 oz (74 kg)-171 lb 11.8 oz (77.9 kg)] 171 lb 11.8 oz (77.9 kg) (03/10 0500) Last BM Date:  (pta) Transfused 962 ml of blood yesterday 2800 IV fluid 900 urine output. 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 Patent left and right gastric arteries are demonstrated. Selective arteriography of the right gastric artery demonstrated no significant duodenal supply. Embolization of the right gastric artery was not performed. No significant pancreaticoduodenal branches were identified from SMA supply.  Intake/Output from previous day: 03/09 0701 - 03/10 0700 In: 5004.4 [I.V.:2742.9; Blood:962.5; IV Piggyback:1299] Out: 900 [Urine:900] Intake/Output this shift:     Not examined: He is angry and threatening.  Lab Results:   Recent Labs  10/09/14 0013 10/09/14 0140  WBC 16.6* 15.6*  HGB 6.3* 5.3*  HCT 18.0* 15.7*  PLT 123* 123*    BMET  Recent Labs  10/08/14 0954  NA 134*  K 3.4*  CL 108  CO2 20  GLUCOSE 138*  BUN 30*  CREATININE 1.32  CALCIUM 7.3*   PT/INR  Recent Labs  10/08/14 1001  LABPROT 14.7  INR 1.14     Recent Labs Lab 10/05/14 2004 10/08/14 0954  AST 18 26  ALT 11 13  ALKPHOS 26* 30*  BILITOT 0.4 0.2*  PROT 3.5* 3.8*  ALBUMIN 1.9* 2.1*     Lipase     Component Value Date/Time   LIPASE 42 05/27/2013 0900     Studies/Results: Ir Angiogram Visceral  Selective  10/08/2014   CLINICAL DATA:  Large ulcer of the duodenal bulb with bleeding and significant anemia requiring transfusion. History of prior ulceration in this area requiring arteriography and transcatheter embolization of the gastroduodenal artery in October, 2014.  EXAM: 1. ULTRASOUND GUIDANCE FOR VASCULAR ACCESS OF THE RIGHT COMMON FEMORAL ARTERY 2. FIRST ORDER SELECTIVE ARTERIOGRAPHY OF THE CELIAC AXIS 3. SECOND ORDER SELECTIVE ARTERIOGRAPHY OF THE COMMON HEPATIC ARTERY 4. THIRD ORDER SELECTIVE ARTERIOGRAPHY OF THE RIGHT GASTRIC ARTERY 5. FIRST ORDER SELECTIVE ARTERIOGRAPHY OF THE SUPERIOR MESENTERIC ARTERY  FLUOROSCOPY TIME:  10 minutes and 6 seconds.  MEDICATIONS AND MEDICAL HISTORY: 2.0 mg IV Versed; 75 mcg IV Fentanyl.  ANESTHESIA/SEDATION: Moderate sedation time: 46 minutes  CONTRAST:  82 ml Omnipaque-300  PROCEDURE: The procedure, risks, benefits, and alternatives were explained to the patient. Questions regarding the procedure were encouraged and answered. The patient understands and consents to the procedure.  The right groin was prepped with Betadine in a sterile fashion, and a sterile drape was applied covering the operative field. A sterile gown and sterile gloves were used for the procedure. Local anesthesia was provided with 1% Lidocaine. Prior to the procedure a timeout was performed. Ultrasound was used to confirm patency of the right common femoral artery.  Access of the right common femoral artery was performed under ultrasound guidance with a micropuncture set. Ultrasound image documentation was performed.A 5-French sheath  was placed. A 5-French Cobra catheter was advanced and used to selectively catheterize the celiac axis. Selective arteriography was performed.  The Cobra catheter was further advanced into the common hepatic artery and selective arteriography performed. A Renegade micro catheter was then advanced through the Cobra catheter a used to selectively catheterize the right  gastric artery. Arteriography was performed.  The superior mesenteric artery was catheterized with a 5 [place] Sos catheter. Selective arteriography was performed.  The catheter was removed. Femoral puncture site was assessed with oblique arteriography. Arteriotomy closure was performed with the Cordis ExoSeal device.  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 A left gastric artery is identified which is patent and also supplies accessory left lobe hepatic supply. The splenic artery is normally patent.  A right gastric artery was identified emanating from the lateral aspect of the proper hepatic artery just beyond the gastroduodenal artery. This was catheterized and selective arteriography demonstrates supply to the mid and distal stomach along the lesser curvature. No definite duodenal supply is identified and there is no evidence of arterial contrast extravasation or pseudoaneurysm. Based on distribution of right gastric artery supply as well as the level of the endoscopically confirmed duodenum bulb ulcer, it was felt that embolization of the right gastric artery would not be of benefit in controlling bleeding and would potentially make portions of the stomach ischemic.  Selective arteriography of the superior mesenteric artery demonstrates no evidence of significant pancreaticoduodenal branches supplying the duodenum. No arterial extravasation or pseudoaneurysm is identified. Branches supplying small bowel and the proximal colon appear normal.  Arteriotomy closure was initially successful in establishing hemostasis.  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 Patent left and right gastric arteries are demonstrated. Selective arteriography of the right gastric artery demonstrated no significant duodenal supply. Embolization of the right gastric  artery was not performed. No significant pancreaticoduodenal branches were identified from SMA supply.   Electronically Signed   By: Irish Lack M.D.   On: 10/08/2014 17:45   Ir Angiogram Visceral Selective  10/08/2014   CLINICAL DATA:  Large ulcer of the duodenal bulb with bleeding and significant anemia requiring transfusion. History of prior ulceration in this area requiring arteriography and transcatheter embolization of the gastroduodenal artery in October, 2014.  EXAM: 1. ULTRASOUND GUIDANCE FOR VASCULAR ACCESS OF THE RIGHT COMMON FEMORAL ARTERY 2. FIRST ORDER SELECTIVE ARTERIOGRAPHY OF THE CELIAC AXIS 3. SECOND ORDER SELECTIVE ARTERIOGRAPHY OF THE COMMON HEPATIC ARTERY 4. THIRD ORDER SELECTIVE ARTERIOGRAPHY OF THE RIGHT GASTRIC ARTERY 5. FIRST ORDER SELECTIVE ARTERIOGRAPHY OF THE SUPERIOR MESENTERIC ARTERY  FLUOROSCOPY TIME:  10 minutes and 6 seconds.  MEDICATIONS AND MEDICAL HISTORY: 2.0 mg IV Versed; 75 mcg IV Fentanyl.  ANESTHESIA/SEDATION: Moderate sedation time: 46 minutes  CONTRAST:  82 ml Omnipaque-300  PROCEDURE: The procedure, risks, benefits, and alternatives were explained to the patient. Questions regarding the procedure were encouraged and answered. The patient understands and consents to the procedure.  The right groin was prepped with Betadine in a sterile fashion, and a sterile drape was applied covering the operative field. A sterile gown and sterile gloves were used for the procedure. Local anesthesia was provided with 1% Lidocaine. Prior to the procedure a timeout was performed. Ultrasound was used to confirm patency of the right common femoral artery.  Access of the right common femoral artery was performed under ultrasound guidance with a micropuncture set. Ultrasound image  documentation was performed.A 5-French sheath was placed. A 5-French Cobra catheter was advanced and used to selectively catheterize the celiac axis. Selective arteriography was performed.  The Cobra catheter was  further advanced into the common hepatic artery and selective arteriography performed. A Renegade micro catheter was then advanced through the Cobra catheter a used to selectively catheterize the right gastric artery. Arteriography was performed.  The superior mesenteric artery was catheterized with a 5 [place] Sos catheter. Selective arteriography was performed.  The catheter was removed. Femoral puncture site was assessed with oblique arteriography. Arteriotomy closure was performed with the Cordis ExoSeal device.  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 A left gastric artery is identified which is patent and also supplies accessory left lobe hepatic supply. The splenic artery is normally patent.  A right gastric artery was identified emanating from the lateral aspect of the proper hepatic artery just beyond the gastroduodenal artery. This was catheterized and selective arteriography demonstrates supply to the mid and distal stomach along the lesser curvature. No definite duodenal supply is identified and there is no evidence of arterial contrast extravasation or pseudoaneurysm. Based on distribution of right gastric artery supply as well as the level of the endoscopically confirmed duodenum bulb ulcer, it was felt that embolization of the right gastric artery would not be of benefit in controlling bleeding and would potentially make portions of the stomach ischemic.  Selective arteriography of the superior mesenteric artery demonstrates no evidence of significant pancreaticoduodenal branches supplying the duodenum. No arterial extravasation or pseudoaneurysm is identified. Branches supplying small bowel and the proximal colon appear normal.  Arteriotomy closure was initially successful in establishing hemostasis.  COMPLICATIONS: None  IMPRESSION: Chronic occlusion of the gastroduodenal artery after prior coil  embolization. Patent left and right gastric arteries are demonstrated. Selective arteriography of the right gastric artery demonstrated no significant duodenal supply. Embolization of the right gastric artery was not performed. No significant pancreaticoduodenal branches were identified from SMA supply.   Electronically Signed   By: Irish Lack M.D.   On: 10/08/2014 17:45   Ir Angiogram Selective Each Additional Vessel  10/08/2014   CLINICAL DATA:  Large ulcer of the duodenal bulb with bleeding and significant anemia requiring transfusion. History of prior ulceration in this area requiring arteriography and transcatheter embolization of the gastroduodenal artery in October, 2014.  EXAM: 1. ULTRASOUND GUIDANCE FOR VASCULAR ACCESS OF THE RIGHT COMMON FEMORAL ARTERY 2. FIRST ORDER SELECTIVE ARTERIOGRAPHY OF THE CELIAC AXIS 3. SECOND ORDER SELECTIVE ARTERIOGRAPHY OF THE COMMON HEPATIC ARTERY 4. THIRD ORDER SELECTIVE ARTERIOGRAPHY OF THE RIGHT GASTRIC ARTERY 5. FIRST ORDER SELECTIVE ARTERIOGRAPHY OF THE SUPERIOR MESENTERIC ARTERY  FLUOROSCOPY TIME:  10 minutes and 6 seconds.  MEDICATIONS AND MEDICAL HISTORY: 2.0 mg IV Versed; 75 mcg IV Fentanyl.  ANESTHESIA/SEDATION: Moderate sedation time: 46 minutes  CONTRAST:  82 ml Omnipaque-300  PROCEDURE: The procedure, risks, benefits, and alternatives were explained to the patient. Questions regarding the procedure were encouraged and answered. The patient understands and consents to the procedure.  The right groin was prepped with Betadine in a sterile fashion, and a sterile drape was applied covering the operative field. A sterile gown and sterile gloves were used for the procedure. Local anesthesia was provided with 1% Lidocaine. Prior to the procedure a timeout was performed. Ultrasound was used to confirm patency of the right common femoral artery.  Access of the right common femoral artery was performed under ultrasound  guidance with a micropuncture set. Ultrasound  image documentation was performed.A 5-French sheath was placed. A 5-French Cobra catheter was advanced and used to selectively catheterize the celiac axis. Selective arteriography was performed.  The Cobra catheter was further advanced into the common hepatic artery and selective arteriography performed. A Renegade micro catheter was then advanced through the Cobra catheter a used to selectively catheterize the right gastric artery. Arteriography was performed.  The superior mesenteric artery was catheterized with a 5 [place] Sos catheter. Selective arteriography was performed.  The catheter was removed. Femoral puncture site was assessed with oblique arteriography. Arteriotomy closure was performed with the Cordis ExoSeal device.  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 A left gastric artery is identified which is patent and also supplies accessory left lobe hepatic supply. The splenic artery is normally patent.  A right gastric artery was identified emanating from the lateral aspect of the proper hepatic artery just beyond the gastroduodenal artery. This was catheterized and selective arteriography demonstrates supply to the mid and distal stomach along the lesser curvature. No definite duodenal supply is identified and there is no evidence of arterial contrast extravasation or pseudoaneurysm. Based on distribution of right gastric artery supply as well as the level of the endoscopically confirmed duodenum bulb ulcer, it was felt that embolization of the right gastric artery would not be of benefit in controlling bleeding and would potentially make portions of the stomach ischemic.  Selective arteriography of the superior mesenteric artery demonstrates no evidence of significant pancreaticoduodenal branches supplying the duodenum. No arterial extravasation or pseudoaneurysm is identified. Branches supplying  small bowel and the proximal colon appear normal.  Arteriotomy closure was initially successful in establishing hemostasis.  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 Patent left and right gastric arteries are demonstrated. Selective arteriography of the right gastric artery demonstrated no significant duodenal supply. Embolization of the right gastric artery was not performed. No significant pancreaticoduodenal branches were identified from SMA supply.   Electronically Signed   By: Irish Lack M.D.   On: 10/08/2014 17:45   Ir Angiogram Selective Each Additional Vessel  10/08/2014   CLINICAL DATA:  Large ulcer of the duodenal bulb with bleeding and significant anemia requiring transfusion. History of prior ulceration in this area requiring arteriography and transcatheter embolization of the gastroduodenal artery in October, 2014.  EXAM: 1. ULTRASOUND GUIDANCE FOR VASCULAR ACCESS OF THE RIGHT COMMON FEMORAL ARTERY 2. FIRST ORDER SELECTIVE ARTERIOGRAPHY OF THE CELIAC AXIS 3. SECOND ORDER SELECTIVE ARTERIOGRAPHY OF THE COMMON HEPATIC ARTERY 4. THIRD ORDER SELECTIVE ARTERIOGRAPHY OF THE RIGHT GASTRIC ARTERY 5. FIRST ORDER SELECTIVE ARTERIOGRAPHY OF THE SUPERIOR MESENTERIC ARTERY  FLUOROSCOPY TIME:  10 minutes and 6 seconds.  MEDICATIONS AND MEDICAL HISTORY: 2.0 mg IV Versed; 75 mcg IV Fentanyl.  ANESTHESIA/SEDATION: Moderate sedation time: 46 minutes  CONTRAST:  82 ml Omnipaque-300  PROCEDURE: The procedure, risks, benefits, and alternatives were explained to the patient. Questions regarding the procedure were encouraged and answered. The patient understands and consents to the procedure.  The right groin was prepped with Betadine in a sterile fashion, and a sterile drape was applied covering the operative field. A sterile gown and sterile gloves were used for the procedure. Local anesthesia was provided with 1% Lidocaine. Prior to the procedure  a timeout was performed. Ultrasound was used to confirm patency of the right common femoral artery.  Access of the  right common femoral artery was performed under ultrasound guidance with a micropuncture set. Ultrasound image documentation was performed.A 5-French sheath was placed. A 5-French Cobra catheter was advanced and used to selectively catheterize the celiac axis. Selective arteriography was performed.  The Cobra catheter was further advanced into the common hepatic artery and selective arteriography performed. A Renegade micro catheter was then advanced through the Cobra catheter a used to selectively catheterize the right gastric artery. Arteriography was performed.  The superior mesenteric artery was catheterized with a 5 [place] Sos catheter. Selective arteriography was performed.  The catheter was removed. Femoral puncture site was assessed with oblique arteriography. Arteriotomy closure was performed with the Cordis ExoSeal device.  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 A left gastric artery is identified which is patent and also supplies accessory left lobe hepatic supply. The splenic artery is normally patent.  A right gastric artery was identified emanating from the lateral aspect of the proper hepatic artery just beyond the gastroduodenal artery. This was catheterized and selective arteriography demonstrates supply to the mid and distal stomach along the lesser curvature. No definite duodenal supply is identified and there is no evidence of arterial contrast extravasation or pseudoaneurysm. Based on distribution of right gastric artery supply as well as the level of the endoscopically confirmed duodenum bulb ulcer, it was felt that embolization of the right gastric artery would not be of benefit in controlling bleeding and would potentially make portions of the stomach ischemic.  Selective  arteriography of the superior mesenteric artery demonstrates no evidence of significant pancreaticoduodenal branches supplying the duodenum. No arterial extravasation or pseudoaneurysm is identified. Branches supplying small bowel and the proximal colon appear normal.  Arteriotomy closure was initially successful in establishing hemostasis.  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 Patent left and right gastric arteries are demonstrated. Selective arteriography of the right gastric artery demonstrated no significant duodenal supply. Embolization of the right gastric artery was not performed. No significant pancreaticoduodenal branches were identified from SMA supply.   Electronically Signed   By: Irish LackGlenn  Yamagata M.D.   On: 10/08/2014 17:45   Ir [place] Guide Vasc Access Right  10/08/2014   CLINICAL DATA:  Large ulcer of the duodenal bulb with bleeding and significant anemia requiring transfusion. History of prior ulceration in this area requiring arteriography and transcatheter embolization of the gastroduodenal artery in October, 2014.  EXAM: 1. ULTRASOUND GUIDANCE FOR VASCULAR ACCESS OF THE RIGHT COMMON FEMORAL ARTERY 2. FIRST ORDER SELECTIVE ARTERIOGRAPHY OF THE CELIAC AXIS 3. SECOND ORDER SELECTIVE ARTERIOGRAPHY OF THE COMMON HEPATIC ARTERY 4. THIRD ORDER SELECTIVE ARTERIOGRAPHY OF THE RIGHT GASTRIC ARTERY 5. FIRST ORDER SELECTIVE ARTERIOGRAPHY OF THE SUPERIOR MESENTERIC ARTERY  FLUOROSCOPY TIME:  10 minutes and 6 seconds.  MEDICATIONS AND MEDICAL HISTORY: 2.0 mg IV Versed; 75 mcg IV Fentanyl.  ANESTHESIA/SEDATION: Moderate sedation time: 46 minutes  CONTRAST:  82 ml Omnipaque-300  PROCEDURE: The procedure, risks, benefits, and alternatives were explained to the patient. Questions regarding the procedure were encouraged and answered. The patient understands and consents to the procedure.  The right groin was prepped with Betadine in a sterile  fashion, and a sterile drape was applied covering the operative field. A sterile gown and sterile gloves were used for the procedure. Local anesthesia was provided with 1% Lidocaine. Prior to the procedure a timeout was performed. Ultrasound was used to confirm patency of the right  common femoral artery.  Access of the right common femoral artery was performed under ultrasound guidance with a micropuncture set. Ultrasound image documentation was performed.A 5-French sheath was placed. A 5-French Cobra catheter was advanced and used to selectively catheterize the celiac axis. Selective arteriography was performed.  The Cobra catheter was further advanced into the common hepatic artery and selective arteriography performed. A Renegade micro catheter was then advanced through the Cobra catheter a used to selectively catheterize the right gastric artery. Arteriography was performed.  The superior mesenteric artery was catheterized with a 5 [place] Sos catheter. Selective arteriography was performed.  The catheter was removed. Femoral puncture site was assessed with oblique arteriography. Arteriotomy closure was performed with the Cordis ExoSeal device.  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 A left gastric artery is identified which is patent and also supplies accessory left lobe hepatic supply. The splenic artery is normally patent.  A right gastric artery was identified emanating from the lateral aspect of the proper hepatic artery just beyond the gastroduodenal artery. This was catheterized and selective arteriography demonstrates supply to the mid and distal stomach along the lesser curvature. No definite duodenal supply is identified and there is no evidence of arterial contrast extravasation or pseudoaneurysm. Based on distribution of right gastric artery supply as well as the level of the endoscopically  confirmed duodenum bulb ulcer, it was felt that embolization of the right gastric artery would not be of benefit in controlling bleeding and would potentially make portions of the stomach ischemic.  Selective arteriography of the superior mesenteric artery demonstrates no evidence of significant pancreaticoduodenal branches supplying the duodenum. No arterial extravasation or pseudoaneurysm is identified. Branches supplying small bowel and the proximal colon appear normal.  Arteriotomy closure was initially successful in establishing hemostasis.  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 Patent left and right gastric arteries are demonstrated. Selective arteriography of the right gastric artery demonstrated no significant duodenal supply. Embolization of the right gastric artery was not performed. No significant pancreaticoduodenal branches were identified from SMA supply.   Electronically Signed   By: Irish Lack M.D.   On: 10/08/2014 17:45    Medications: . [START ON 10/11/2014] pantoprazole (PROTONIX) IV  40 mg Intravenous Q12H    Assessment/Plan 1.Recurrent GI bleed; s/p oversewing of duodenal ulcer 08/10/12, and embolization of the gastroduodenal artery 05/03/13. 2. Hx of ETOH abuse/ cocaine use/marijuana use. 3. Psychiatric evaluation: pt did not have capacity 05/01/13. 4. Severe FE deficiency 5. Malnutrition albumin 1.9, total protein, 3.8. 6. Tobacco use 7. Hypokalemia  8.  Cocaine positive drug screen  Plan:  None     LOS: 1 day    JENNINGS,WILLARD 10/09/2014 I attempted to speak with this patient.  He was full of anger toward me and I did not leave this encounter feeling that he wanted my services.  Certainly exploratory laparotomy and oversewing would have a high risk of death because of his prior surgery and scarring, the chronic ulcer that the visible vessel is in, his malnutrition and self destructive  behavior.  Would favor IR or endo approaches before surgery.

## 2014-10-10 ENCOUNTER — Encounter (HOSPITAL_COMMUNITY): Payer: Self-pay | Admitting: Internal Medicine

## 2014-10-10 ENCOUNTER — Inpatient Hospital Stay (HOSPITAL_COMMUNITY): Payer: Medicaid Other

## 2014-10-10 ENCOUNTER — Inpatient Hospital Stay (HOSPITAL_COMMUNITY): Payer: Medicaid Other | Admitting: Anesthesiology

## 2014-10-10 ENCOUNTER — Encounter (HOSPITAL_COMMUNITY)
Admission: EM | Disposition: A | Discharge status: Home Health | Payer: Self-pay | Source: Home / Self Care | Attending: Internal Medicine

## 2014-10-10 DIAGNOSIS — J9601 Acute respiratory failure with hypoxia: Secondary | ICD-10-CM

## 2014-10-10 DIAGNOSIS — Z9889 Other specified postprocedural states: Secondary | ICD-10-CM

## 2014-10-10 HISTORY — PX: LAPAROTOMY: SHX154

## 2014-10-10 LAB — PREPARE FRESH FROZEN PLASMA
UNIT DIVISION: 0
Unit division: 0

## 2014-10-10 LAB — CBC
HCT: 20.1 % — ABNORMAL LOW (ref 39.0–52.0)
HCT: 17.1 % — ABNORMAL LOW (ref 39.0–52.0)
HCT: 32 % — ABNORMAL LOW (ref 39.0–52.0)
HCT: 32.1 % — ABNORMAL LOW (ref 39.0–52.0)
HEMOGLOBIN: 6.8 g/dL — AB (ref 13.0–17.0)
HEMOGLOBIN: 10.8 g/dL — AB (ref 13.0–17.0)
Hemoglobin: 5.8 g/dL — CL (ref 13.0–17.0)
Hemoglobin: 10.9 g/dL — ABNORMAL LOW (ref 13.0–17.0)
MCH: 29.7 pg (ref 26.0–34.0)
MCH: 29.6 pg (ref 26.0–34.0)
MCH: 30.2 pg (ref 26.0–34.0)
MCH: 30.2 pg (ref 26.0–34.0)
MCHC: 33.8 g/dL (ref 30.0–36.0)
MCHC: 33.9 g/dL (ref 30.0–36.0)
MCHC: 33.8 g/dL (ref 30.0–36.0)
MCHC: 34 g/dL (ref 30.0–36.0)
MCV: 87.4 fL (ref 78.0–100.0)
MCV: 87.7 fL (ref 78.0–100.0)
MCV: 88.9 fL (ref 78.0–100.0)
MCV: 89.4 fL (ref 78.0–100.0)
PLATELETS: 73 10*3/uL — AB (ref 150–400)
PLATELETS: 28 10*3/uL — AB (ref 150–400)
Platelets: 71 10*3/uL — ABNORMAL LOW (ref 150–400)
Platelets: 26 10*3/uL — CL (ref 150–400)
RBC: 2.3 MIL/uL — ABNORMAL LOW (ref 4.22–5.81)
RBC: 1.95 MIL/uL — ABNORMAL LOW (ref 4.22–5.81)
RBC: 3.61 MIL/uL — AB (ref 4.22–5.81)
RBC: 3.58 MIL/uL — ABNORMAL LOW (ref 4.22–5.81)
RDW: 15.3 % (ref 11.5–15.5)
RDW: 14.1 % (ref 11.5–15.5)
RDW: 14.1 % (ref 11.5–15.5)
RDW: 16.1 % — AB (ref 11.5–15.5)
WBC: 16.7 10*3/uL — ABNORMAL HIGH (ref 4.0–10.5)
WBC: 15.9 10*3/uL — AB (ref 4.0–10.5)
WBC: 14.2 10*3/uL — AB (ref 4.0–10.5)
WBC: 14.9 10*3/uL — ABNORMAL HIGH (ref 4.0–10.5)

## 2014-10-10 LAB — BASIC METABOLIC PANEL
Anion gap: 2 — ABNORMAL LOW (ref 5–15)
Anion gap: 1 — ABNORMAL LOW (ref 5–15)
BUN: 29 mg/dL — AB (ref 6–23)
BUN: 24 mg/dL — AB (ref 6–23)
CHLORIDE: 119 mmol/L — AB (ref 96–112)
CO2: 21 mmol/L (ref 19–32)
CO2: 22 mmol/L (ref 19–32)
CREATININE: 1.14 mg/dL (ref 0.50–1.35)
Calcium: 7.1 mg/dL — ABNORMAL LOW (ref 8.4–10.5)
Calcium: 7 mg/dL — ABNORMAL LOW (ref 8.4–10.5)
Chloride: 119 mmol/L — ABNORMAL HIGH (ref 96–112)
Creatinine, Ser: 1.26 mg/dL (ref 0.50–1.35)
GFR calc Af Amer: 83 mL/min — ABNORMAL LOW (ref >90)
GFR calc non Af Amer: 63 mL/min — ABNORMAL LOW (ref >90)
GFR calc non Af Amer: 71 mL/min — ABNORMAL LOW (ref >90)
GFR, EST AFRICAN AMERICAN: 73 mL/min — AB (ref >90)
Glucose, Bld: 100 mg/dL — ABNORMAL HIGH (ref 70–99)
Glucose, Bld: 168 mg/dL — ABNORMAL HIGH (ref 70–99)
POTASSIUM: 3.8 mmol/L (ref 3.5–5.1)
Potassium: 4.2 mmol/L (ref 3.5–5.1)
Sodium: 142 mmol/L (ref 135–145)
Sodium: 142 mmol/L (ref 135–145)

## 2014-10-10 LAB — FIBRINOGEN: FIBRINOGEN: 140 mg/dL — AB (ref 204–475)

## 2014-10-10 LAB — MAGNESIUM: MAGNESIUM: 1.5 mg/dL (ref 1.5–2.5)

## 2014-10-10 LAB — PROTIME-INR
INR: 1.54 — ABNORMAL HIGH (ref 0.00–1.49)
Prothrombin Time: 18.7 seconds — ABNORMAL HIGH (ref 11.6–15.2)

## 2014-10-10 LAB — PHOSPHORUS: Phosphorus: 4.4 mg/dL (ref 2.3–4.6)

## 2014-10-10 LAB — PREPARE RBC (CROSSMATCH)

## 2014-10-10 SURGERY — LAPAROTOMY, EXPLORATORY
Anesthesia: General | Site: Abdomen

## 2014-10-10 MED ORDER — FENTANYL CITRATE 0.05 MG/ML IJ SOLN
INTRAMUSCULAR | Status: DC | PRN
  Administered 2014-10-10: 50 ug via INTRAVENOUS
  Administered 2014-10-10: 100 ug via INTRAVENOUS

## 2014-10-10 MED ORDER — INSULIN ASPART 100 UNIT/ML ~~LOC~~ SOLN
0.0000 [IU] | Freq: Four times a day (QID) | SUBCUTANEOUS | Status: DC
  Administered 2014-10-11 – 2014-10-13 (×3): 1 [IU] via SUBCUTANEOUS
  Administered 2014-10-13: 0 [IU] via SUBCUTANEOUS
  Administered 2014-10-14: 2 [IU] via SUBCUTANEOUS
  Administered 2014-10-14: 1 [IU] via SUBCUTANEOUS

## 2014-10-10 MED ORDER — SODIUM CHLORIDE 0.9 % IV SOLN: Freq: Once | INTRAVENOUS | Status: DC

## 2014-10-10 MED ORDER — DEXTROSE 5 % IV SOLN
2.0000 g | Freq: Three times a day (TID) | INTRAVENOUS | Status: DC
  Administered 2014-10-10 – 2014-10-11 (×2): 2 g via INTRAVENOUS
  Filled 2014-10-10 (×3): qty 2

## 2014-10-10 MED ORDER — SODIUM BICARBONATE 8.4 % IV SOLN
INTRAVENOUS | Status: AC
  Filled 2014-10-10: qty 50

## 2014-10-10 MED ORDER — 0.9 % SODIUM CHLORIDE (POUR BTL) OPTIME
TOPICAL | Status: DC | PRN
  Administered 2014-10-10: 2000 mL

## 2014-10-10 MED ORDER — DEXTROSE-NACL 5-0.45 % IV SOLN
INTRAVENOUS | Status: DC
  Administered 2014-10-10: 11:00:00 via INTRAVENOUS
  Administered 2014-10-11 (×2): 100 mL/h via INTRAVENOUS
  Administered 2014-10-14: 1000 mL via INTRAVENOUS
  Administered 2014-10-19: 03:00:00 via INTRAVENOUS

## 2014-10-10 MED ORDER — SUCCINYLCHOLINE CHLORIDE 20 MG/ML IJ SOLN
INTRAMUSCULAR | Status: AC
  Filled 2014-10-10: qty 1

## 2014-10-10 MED ORDER — FENTANYL CITRATE 0.05 MG/ML IJ SOLN
INTRAMUSCULAR | Status: AC
  Filled 2014-10-10: qty 4

## 2014-10-10 MED ORDER — SODIUM BICARBONATE 8.4 % IV SOLN
INTRAVENOUS | Status: DC | PRN
  Administered 2014-10-10 (×2): 50 meq via INTRAVENOUS

## 2014-10-10 MED ORDER — LIDOCAINE HCL (CARDIAC) 20 MG/ML IV SOLN
INTRAVENOUS | Status: DC | PRN
  Administered 2014-10-10: 100 mg via INTRAVENOUS

## 2014-10-10 MED ORDER — CALCIUM CHLORIDE 10 % IV SOLN
INTRAVENOUS | Status: AC
  Filled 2014-10-10: qty 10

## 2014-10-10 MED ORDER — ATROPINE SULFATE 0.1 MG/ML IJ SOLN
INTRAMUSCULAR | Status: AC
  Filled 2014-10-10: qty 10

## 2014-10-10 MED ORDER — PROPOFOL 10 MG/ML IV BOLUS
INTRAVENOUS | Status: AC
  Filled 2014-10-10: qty 20

## 2014-10-10 MED ORDER — ETOMIDATE 2 MG/ML IV SOLN
INTRAVENOUS | Status: AC
  Filled 2014-10-10: qty 20

## 2014-10-10 MED ORDER — PHENYLEPHRINE 40 MCG/ML (10ML) SYRINGE FOR IV PUSH (FOR BLOOD PRESSURE SUPPORT)
PREFILLED_SYRINGE | INTRAVENOUS | Status: AC
  Filled 2014-10-10: qty 40

## 2014-10-10 MED ORDER — DEXTROSE 5 % IV SOLN
2.0000 g | INTRAVENOUS | Status: DC | PRN
  Administered 2014-10-10: 2 g via INTRAVENOUS

## 2014-10-10 MED ORDER — SODIUM CHLORIDE 0.9 % IV BOLUS (SEPSIS)
1000.0000 mL | Freq: Once | INTRAVENOUS | Status: AC
  Administered 2014-10-10: 1000 mL via INTRAVENOUS

## 2014-10-10 MED ORDER — MIDAZOLAM HCL 2 MG/2ML IJ SOLN
INTRAMUSCULAR | Status: AC
  Filled 2014-10-10: qty 2

## 2014-10-10 MED ORDER — SODIUM CHLORIDE 0.9 % IV SOLN
INTRAVENOUS | Status: DC | PRN
  Administered 2014-10-10 (×2): via INTRAVENOUS

## 2014-10-10 MED ORDER — PROPOFOL 10 MG/ML IV BOLUS
INTRAVENOUS | Status: DC | PRN
  Administered 2014-10-10: 130 mg via INTRAVENOUS

## 2014-10-10 MED ORDER — LACTATED RINGERS IV SOLN
INTRAVENOUS | Status: DC | PRN
  Administered 2014-10-10: 14:00:00 via INTRAVENOUS

## 2014-10-10 MED ORDER — DEXTROSE 5 % IV SOLN
INTRAVENOUS | Status: AC
  Filled 2014-10-10: qty 2

## 2014-10-10 MED ORDER — ROCURONIUM BROMIDE 50 MG/5ML IV SOLN
INTRAVENOUS | Status: AC
  Filled 2014-10-10: qty 2

## 2014-10-10 MED ORDER — PHENYLEPHRINE HCL 10 MG/ML IJ SOLN
INTRAMUSCULAR | Status: AC
  Filled 2014-10-10: qty 1

## 2014-10-10 MED ORDER — FENTANYL CITRATE 0.05 MG/ML IJ SOLN
25.0000 ug | INTRAMUSCULAR | Status: DC | PRN
  Administered 2014-10-11 – 2014-10-17 (×26): 100 ug via INTRAVENOUS
  Filled 2014-10-10 (×26): qty 2

## 2014-10-10 MED ORDER — LIDOCAINE HCL (CARDIAC) 20 MG/ML IV SOLN
INTRAVENOUS | Status: AC
  Filled 2014-10-10: qty 5

## 2014-10-10 MED ORDER — CALCIUM CHLORIDE 10 % IV SOLN
INTRAVENOUS | Status: DC | PRN
  Administered 2014-10-10: 1 g via INTRAVENOUS

## 2014-10-10 MED ORDER — MIDAZOLAM HCL 5 MG/5ML IJ SOLN
INTRAMUSCULAR | Status: DC | PRN
  Administered 2014-10-10: 2 mg via INTRAVENOUS

## 2014-10-10 MED ORDER — CHLORHEXIDINE GLUCONATE 0.12 % MT SOLN
15.0000 mL | Freq: Two times a day (BID) | OROMUCOSAL | Status: DC
  Administered 2014-10-10 – 2014-10-18 (×9): 15 mL via OROMUCOSAL
  Filled 2014-10-10 (×18): qty 15

## 2014-10-10 MED ORDER — LACTATED RINGERS IV SOLN
INTRAVENOUS | Status: DC | PRN
  Administered 2014-10-10 (×2): via INTRAVENOUS

## 2014-10-10 MED ORDER — DEXAMETHASONE SODIUM PHOSPHATE 10 MG/ML IJ SOLN
INTRAMUSCULAR | Status: AC
  Filled 2014-10-10: qty 1

## 2014-10-10 MED ORDER — ROCURONIUM BROMIDE 100 MG/10ML IV SOLN
INTRAVENOUS | Status: DC | PRN
  Administered 2014-10-10 (×2): 40 mg via INTRAVENOUS

## 2014-10-10 MED ORDER — SODIUM CHLORIDE 0.9 % IV SOLN
10.0000 mg | INTRAVENOUS | Status: DC | PRN
  Administered 2014-10-10: 100 ug/min via INTRAVENOUS

## 2014-10-10 MED ORDER — PROPOFOL 10 MG/ML IV EMUL
5.0000 ug/kg/min | INTRAVENOUS | Status: DC
  Administered 2014-10-10 (×2): 30 ug/kg/min via INTRAVENOUS
  Administered 2014-10-11 (×2): 40 ug/kg/min via INTRAVENOUS
  Administered 2014-10-11 (×2): 30 ug/kg/min via INTRAVENOUS
  Filled 2014-10-10 (×8): qty 100

## 2014-10-10 MED ORDER — CETYLPYRIDINIUM CHLORIDE 0.05 % MT LIQD
7.0000 mL | Freq: Four times a day (QID) | OROMUCOSAL | Status: DC
Start: 2014-10-11 — End: 2014-10-20
  Administered 2014-10-11 – 2014-10-20 (×21): 7 mL via OROMUCOSAL

## 2014-10-10 MED ORDER — MIDAZOLAM HCL 2 MG/2ML IJ SOLN
INTRAMUSCULAR | Status: AC
  Filled 2014-10-10: qty 4

## 2014-10-10 MED ORDER — FENTANYL CITRATE 0.05 MG/ML IJ SOLN
INTRAMUSCULAR | Status: AC
  Filled 2014-10-10: qty 5

## 2014-10-10 MED ORDER — SUCCINYLCHOLINE CHLORIDE 20 MG/ML IJ SOLN
INTRAMUSCULAR | Status: DC | PRN
  Administered 2014-10-10: 100 mg via INTRAVENOUS

## 2014-10-10 MED ORDER — ONDANSETRON HCL 4 MG/2ML IJ SOLN
INTRAMUSCULAR | Status: AC
  Filled 2014-10-10: qty 2

## 2014-10-10 MED ORDER — MAGNESIUM SULFATE 2 GM/50ML IV SOLN
2.0000 g | Freq: Once | INTRAVENOUS | Status: AC
  Administered 2014-10-10: 2 g via INTRAVENOUS
  Filled 2014-10-10: qty 50

## 2014-10-10 SURGICAL SUPPLY — 42 items
APPLICATOR COTTON TIP 6IN STRL (MISCELLANEOUS) IMPLANT
BLADE EXTENDED COATED 6.5IN (ELECTRODE) ×3 IMPLANT
BLADE HEX COATED 2.75 (ELECTRODE) ×3 IMPLANT
COVER MAYO STAND STRL (DRAPES) ×3 IMPLANT
DRAIN CHANNEL 19F RND (DRAIN) ×3 IMPLANT
DRAPE LAPAROSCOPIC ABDOMINAL (DRAPES) ×3 IMPLANT
DRAPE SHEET LG 3/4 BI-LAMINATE (DRAPES) ×6 IMPLANT
DRAPE WARM FLUID 44X44 (DRAPE) ×3 IMPLANT
DRSG PAD ABDOMINAL 8X10 ST (GAUZE/BANDAGES/DRESSINGS) ×3 IMPLANT
ELECT REM PT RETURN 9FT ADLT (ELECTROSURGICAL) ×3
ELECTRODE REM PT RTRN 9FT ADLT (ELECTROSURGICAL) ×1 IMPLANT
EVACUATOR SILICONE 100CC (DRAIN) ×3 IMPLANT
GAUZE SPONGE 4X4 12PLY STRL (GAUZE/BANDAGES/DRESSINGS) ×3 IMPLANT
GLOVE BIOGEL M 8.0 STRL (GLOVE) ×6 IMPLANT
GOWN SPEC L4 XLG W/TWL (GOWN DISPOSABLE) ×3 IMPLANT
GOWN STRL REUS W/TWL XL LVL3 (GOWN DISPOSABLE) ×9 IMPLANT
KIT BASIN OR (CUSTOM PROCEDURE TRAY) ×3 IMPLANT
LIGASURE IMPACT 36 18CM CVD LR (INSTRUMENTS) ×3 IMPLANT
NS IRRIG 1000ML POUR BTL (IV SOLUTION) ×6 IMPLANT
PACK GENERAL/GYN (CUSTOM PROCEDURE TRAY) ×3 IMPLANT
SHEARS HARMONIC ACE PLUS 36CM (ENDOMECHANICALS) ×3 IMPLANT
SPONGE DRAIN TRACH 4X4 STRL 2S (GAUZE/BANDAGES/DRESSINGS) ×3 IMPLANT
SPONGE LAP 18X18 X RAY DECT (DISPOSABLE) ×12 IMPLANT
STAPLER VISISTAT 35W (STAPLE) ×3 IMPLANT
SUCTION POOLE TIP (SUCTIONS) ×6 IMPLANT
SUT NOVA NAB DX-16 0-1 5-0 T12 (SUTURE) ×6 IMPLANT
SUT PDS AB 1 CTX 36 (SUTURE) IMPLANT
SUT PDS AB 3-0 SH 27 (SUTURE) ×6 IMPLANT
SUT SILK 2 0 (SUTURE) ×3
SUT SILK 2 0 SH (SUTURE) ×6 IMPLANT
SUT SILK 2 0 SH CR/8 (SUTURE) ×3 IMPLANT
SUT SILK 2-0 18XBRD TIE 12 (SUTURE) ×1 IMPLANT
SUT SILK 3 0 (SUTURE) ×2
SUT SILK 3 0 SH CR/8 (SUTURE) ×6 IMPLANT
SUT SILK 3-0 18XBRD TIE 12 (SUTURE) ×1 IMPLANT
SUT VIC AB 3-0 SH 18 (SUTURE) IMPLANT
SUT VICRYL 2 0 18  UND BR (SUTURE)
SUT VICRYL 2 0 18 UND BR (SUTURE) IMPLANT
TAPE CLOTH SURG 4X10 WHT LF (GAUZE/BANDAGES/DRESSINGS) ×3 IMPLANT
TOWEL OR 17X26 10 PK STRL BLUE (TOWEL DISPOSABLE) ×6 IMPLANT
TOWEL OR NON WOVEN STRL DISP B (DISPOSABLE) ×3 IMPLANT
TRAY FOLEY CATH 14FRSI W/METER (CATHETERS) IMPLANT

## 2014-10-10 NOTE — Progress Notes (Signed)
On way to OR patient had 3 more episodes of emesis of BRB aprox 100 ml total had bradycardiac episode. Went into SB in 30's sustained. Patient then went back into NSR at a rate of 90's-100's. Atropine was taken on transport to OR. CRNA notified in OR.

## 2014-10-10 NOTE — Brief Op Note (Signed)
10/08/2014 - 10/10/2014  3:43 PM  PATIENT:  [name]  54 y.o. male  PRE-OPERATIVE DIAGNOSIS:  duodenal ulcer  POST-OPERATIVE DIAGNOSIS:  duodenal ulcer  PROCEDURE:  Procedure(s): EXPLORATORY LAPAROTOMY WITH GASTROPATHY AND OVER SEWEN DUODENAL ULCER REGION  (N/A)  SURGEON:  Surgeon(s) and Role:    * Luretha MurphyMatthew Yenny Kosa, MD - Primary  PHYSICIAN ASSISTANT:   ASSISTANTS: Barnetta ChapelKelly Osborne, PA   ANESTHESIA:   general  EBL:  Total I/O In: 9109.9 [P.O.:750; I.V.:3418.5; Blood:3942.3; IV Piggyback:999] Out: 790 [Urine:300; Emesis/NG output:140; Stool:350]  BLOOD ADMINISTERED:7 units CC PRBC  DRAINS: (1) Jackson-Pratt drain(s) with closed bulb suction in the periduodenal area   LOCAL MEDICATIONS USED:  NONE  SPECIMEN:  No Specimen  DISPOSITION OF SPECIMEN:  N/A  COUNTS:  YES  TOURNIQUET:  * No tourniquets in log *  DICTATION: .Dragon Dictation and Other Dictation: Dictation Number [id number]  PLAN OF CARE: return to ICU  PATIENT DISPOSITION:  ICU - intubated and critically ill.   Delay start of Pharmacological VTE agent (>24hrs) due to surgical blood loss or risk of bleeding: yes

## 2014-10-10 NOTE — Anesthesia Procedure Notes (Signed)
Procedure Name: Intubation Date/Time: 10/10/2014 1:35 PM Performed by: Ryan DikesPETERS, [name] Pre-anesthesia Checklist: Patient identified, Emergency Drugs available, Suction available and Patient being monitored Patient Re-evaluated:Patient Re-evaluated prior to inductionOxygen Delivery Method: Circle System Utilized Preoxygenation: Pre-oxygenation with 100% oxygen Intubation Type: IV induction, Cricoid Pressure applied and Rapid sequence Ventilation: Mask ventilation without difficulty Laryngoscope Size: Mac and 4 Grade View: Grade I Tube type: Oral Tube size: 7.5 mm Number of attempts: 1 Airway Equipment and Method: Stylet and Oral airway Placement Confirmation: ETT inserted through vocal cords under direct vision,  positive ETCO2 and breath sounds checked- equal and bilateral Secured at: 21 cm Tube secured with: Tape Dental Injury: Teeth and Oropharynx as per pre-operative assessment

## 2014-10-10 NOTE — Progress Notes (Signed)
CRITICAL VALUE ALERT  Critical value received:  Hgb 6.8  Date of notification:  10/10/2014  Time of notification:  01:30  Critical value read back:Yes.    Nurse who received alert:  Cai Anfinson D. Kerrilyn Azbill  MD notified (1st page):  Elink  Time of first page:  01:45  MD notified (2nd page): N/A  Time of second page: N/A  Responding MD:  Dr. Delton CoombesByrum  Time MD responded:  01:45

## 2014-10-10 NOTE — Progress Notes (Signed)
Patient ID: [name], male   DOB: 04/18/1961, 54 y.o.   MRN: [id number] 1 Day Post-Op  Subjective: Pt agitated on vent.  Wants to be extubated.  No further bloody BMs per RN.  Patient denies abdominal pain.  Objective: Vital signs in last 24 hours: Temp:  [97.4 F (36.3 C)-98.6 F (37 C)] 97.9 F (36.6 C) (03/11 0658) Pulse Rate:  [67-116] 76 (03/11 0800) Resp:  [15-25] 15 (03/11 0800) BP: (81-137)/(29-95) 118/53 mmHg (03/11 0800) SpO2:  [99 %-100 %] 100 % (03/11 0800) FiO2 (%):  [30 %-100 %] 30 % (03/11 0800) Weight:  [171 lb 8.3 oz (77.8 kg)] 171 lb 8.3 oz (77.8 kg) (03/11 0430) Last BM Date: 10/09/14  Intake/Output from previous day: 03/10 0701 - 03/11 0700 In: 3425.1 [I.V.:1878.1; Blood:1517] Out: 550 [Urine:550] Intake/Output this shift: Total I/O In: 191.5 [I.V.:191.5] Out: -   PE: Abd: soft, NT, Nd, +BS Heart: regular Lungs: CTAB  Lab Results:   Recent Labs  10/10/14 0001 10/10/14 0645  WBC 16.7* 14.2*  HGB 6.8* 5.8*  HCT 20.1* 17.1*  PLT 71* 73*   BMET  Recent Labs  10/09/14 1455 10/10/14 0530  NA 141 142  K 4.2 3.8  CL 120* 119*  CO2 21 21  GLUCOSE 170* 100*  BUN 32* 29*  CREATININE 1.18 1.26  CALCIUM 6.7* 7.1*   PT/INR  Recent Labs  10/08/14 1001  LABPROT 14.7  INR 1.14   CMP     Component Value Date/Time   NA 142 10/10/2014 0530   K 3.8 10/10/2014 0530   CL 119* 10/10/2014 0530   CO2 21 10/10/2014 0530   GLUCOSE 100* 10/10/2014 0530   BUN 29* 10/10/2014 0530   CREATININE 1.26 10/10/2014 0530   CALCIUM 7.1* 10/10/2014 0530   PROT 3.8* 10/08/2014 0954   ALBUMIN 2.1* 10/08/2014 0954   AST 26 10/08/2014 0954   ALT 13 10/08/2014 0954   ALKPHOS 30* 10/08/2014 0954   BILITOT 0.2* 10/08/2014 0954   GFRNONAA 63* 10/10/2014 0530   GFRAA 73* 10/10/2014 0530   Lipase     Component Value Date/Time   LIPASE 42 05/27/2013 0900       Studies/Results: Ir Angiogram Visceral Selective  10/08/2014   CLINICAL DATA:  Large ulcer  of the duodenal bulb with bleeding and significant anemia requiring transfusion. History of prior ulceration in this area requiring arteriography and transcatheter embolization of the gastroduodenal artery in October, 2014.  EXAM: 1. ULTRASOUND GUIDANCE FOR VASCULAR ACCESS OF THE RIGHT COMMON FEMORAL ARTERY 2. FIRST ORDER SELECTIVE ARTERIOGRAPHY OF THE CELIAC AXIS 3. SECOND ORDER SELECTIVE ARTERIOGRAPHY OF THE COMMON HEPATIC ARTERY 4. THIRD ORDER SELECTIVE ARTERIOGRAPHY OF THE RIGHT GASTRIC ARTERY 5. FIRST ORDER SELECTIVE ARTERIOGRAPHY OF THE SUPERIOR MESENTERIC ARTERY  FLUOROSCOPY TIME:  10 minutes and 6 seconds.  MEDICATIONS AND MEDICAL HISTORY: 2.0 mg IV Versed; 75 mcg IV Fentanyl.  ANESTHESIA/SEDATION: Moderate sedation time: 46 minutes  CONTRAST:  82 ml Omnipaque-300  PROCEDURE: The procedure, risks, benefits, and alternatives were explained to the patient. Questions regarding the procedure were encouraged and answered. The patient understands and consents to the procedure.  The right groin was prepped with Betadine in a sterile fashion, and a sterile drape was applied covering the operative field. A sterile gown and sterile gloves were used for the procedure. Local anesthesia was provided with 1% Lidocaine. Prior to the procedure a timeout was performed. Ultrasound was used to confirm patency of the right common femoral artery.  Access of  the right common femoral artery was performed under ultrasound guidance with a micropuncture set. Ultrasound image documentation was performed.A 5-French sheath was placed. A 5-French Cobra catheter was advanced and used to selectively catheterize the celiac axis. Selective arteriography was performed.  The Cobra catheter was further advanced into the common hepatic artery and selective arteriography performed. A Renegade micro catheter was then advanced through the Cobra catheter a used to selectively catheterize the right gastric artery. Arteriography was performed.  The  superior mesenteric artery was catheterized with a 5 [place] Sos catheter. Selective arteriography was performed.  The catheter was removed. Femoral puncture site was assessed with oblique arteriography. Arteriotomy closure was performed with the Cordis ExoSeal device.  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 A left gastric artery is identified which is patent and also supplies accessory left lobe hepatic supply. The splenic artery is normally patent.  A right gastric artery was identified emanating from the lateral aspect of the proper hepatic artery just beyond the gastroduodenal artery. This was catheterized and selective arteriography demonstrates supply to the mid and distal stomach along the lesser curvature. No definite duodenal supply is identified and there is no evidence of arterial contrast extravasation or pseudoaneurysm. Based on distribution of right gastric artery supply as well as the level of the endoscopically confirmed duodenum bulb ulcer, it was felt that embolization of the right gastric artery would not be of benefit in controlling bleeding and would potentially make portions of the stomach ischemic.  Selective arteriography of the superior mesenteric artery demonstrates no evidence of significant pancreaticoduodenal branches supplying the duodenum. No arterial extravasation or pseudoaneurysm is identified. Branches supplying small bowel and the proximal colon appear normal.  Arteriotomy closure was initially successful in establishing hemostasis.  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 Patent left and right gastric arteries are demonstrated. Selective arteriography of the right gastric artery demonstrated no significant duodenal supply. Embolization of the right gastric artery was not performed. No significant  pancreaticoduodenal branches were identified from SMA supply.   Electronically Signed   By: Irish Lack M.D.   On: 10/08/2014 17:45   Ir Angiogram Visceral Selective  10/08/2014   CLINICAL DATA:  Large ulcer of the duodenal bulb with bleeding and significant anemia requiring transfusion. History of prior ulceration in this area requiring arteriography and transcatheter embolization of the gastroduodenal artery in October, 2014.  EXAM: 1. ULTRASOUND GUIDANCE FOR VASCULAR ACCESS OF THE RIGHT COMMON FEMORAL ARTERY 2. FIRST ORDER SELECTIVE ARTERIOGRAPHY OF THE CELIAC AXIS 3. SECOND ORDER SELECTIVE ARTERIOGRAPHY OF THE COMMON HEPATIC ARTERY 4. THIRD ORDER SELECTIVE ARTERIOGRAPHY OF THE RIGHT GASTRIC ARTERY 5. FIRST ORDER SELECTIVE ARTERIOGRAPHY OF THE SUPERIOR MESENTERIC ARTERY  FLUOROSCOPY TIME:  10 minutes and 6 seconds.  MEDICATIONS AND MEDICAL HISTORY: 2.0 mg IV Versed; 75 mcg IV Fentanyl.  ANESTHESIA/SEDATION: Moderate sedation time: 46 minutes  CONTRAST:  82 ml Omnipaque-300  PROCEDURE: The procedure, risks, benefits, and alternatives were explained to the patient. Questions regarding the procedure were encouraged and answered. The patient understands and consents to the procedure.  The right groin was prepped with Betadine in a sterile fashion, and a sterile drape was applied covering the operative field. A sterile gown and sterile gloves were used for the procedure. Local anesthesia was provided with 1% Lidocaine. Prior to the procedure a timeout was performed. Ultrasound was used to confirm patency of the right common  femoral artery.  Access of the right common femoral artery was performed under ultrasound guidance with a micropuncture set. Ultrasound image documentation was performed.A 5-French sheath was placed. A 5-French Cobra catheter was advanced and used to selectively catheterize the celiac axis. Selective arteriography was performed.  The Cobra catheter was further advanced into the common hepatic  artery and selective arteriography performed. A Renegade micro catheter was then advanced through the Cobra catheter a used to selectively catheterize the right gastric artery. Arteriography was performed.  The superior mesenteric artery was catheterized with a 5 [place] Sos catheter. Selective arteriography was performed.  The catheter was removed. Femoral puncture site was assessed with oblique arteriography. Arteriotomy closure was performed with the Cordis ExoSeal device.  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 A left gastric artery is identified which is patent and also supplies accessory left lobe hepatic supply. The splenic artery is normally patent.  A right gastric artery was identified emanating from the lateral aspect of the proper hepatic artery just beyond the gastroduodenal artery. This was catheterized and selective arteriography demonstrates supply to the mid and distal stomach along the lesser curvature. No definite duodenal supply is identified and there is no evidence of arterial contrast extravasation or pseudoaneurysm. Based on distribution of right gastric artery supply as well as the level of the endoscopically confirmed duodenum bulb ulcer, it was felt that embolization of the right gastric artery would not be of benefit in controlling bleeding and would potentially make portions of the stomach ischemic.  Selective arteriography of the superior mesenteric artery demonstrates no evidence of significant pancreaticoduodenal branches supplying the duodenum. No arterial extravasation or pseudoaneurysm is identified. Branches supplying small bowel and the proximal colon appear normal.  Arteriotomy closure was initially successful in establishing hemostasis.  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 Patent left and right  gastric arteries are demonstrated. Selective arteriography of the right gastric artery demonstrated no significant duodenal supply. Embolization of the right gastric artery was not performed. No significant pancreaticoduodenal branches were identified from SMA supply.   Electronically Signed   By: Irish Lack M.D.   On: 10/08/2014 17:45   Ir Angiogram Selective Each Additional Vessel  10/08/2014   CLINICAL DATA:  Large ulcer of the duodenal bulb with bleeding and significant anemia requiring transfusion. History of prior ulceration in this area requiring arteriography and transcatheter embolization of the gastroduodenal artery in October, 2014.  EXAM: 1. ULTRASOUND GUIDANCE FOR VASCULAR ACCESS OF THE RIGHT COMMON FEMORAL ARTERY 2. FIRST ORDER SELECTIVE ARTERIOGRAPHY OF THE CELIAC AXIS 3. SECOND ORDER SELECTIVE ARTERIOGRAPHY OF THE COMMON HEPATIC ARTERY 4. THIRD ORDER SELECTIVE ARTERIOGRAPHY OF THE RIGHT GASTRIC ARTERY 5. FIRST ORDER SELECTIVE ARTERIOGRAPHY OF THE SUPERIOR MESENTERIC ARTERY  FLUOROSCOPY TIME:  10 minutes and 6 seconds.  MEDICATIONS AND MEDICAL HISTORY: 2.0 mg IV Versed; 75 mcg IV Fentanyl.  ANESTHESIA/SEDATION: Moderate sedation time: 46 minutes  CONTRAST:  82 ml Omnipaque-300  PROCEDURE: The procedure, risks, benefits, and alternatives were explained to the patient. Questions regarding the procedure were encouraged and answered. The patient understands and consents to the procedure.  The right groin was prepped with Betadine in a sterile fashion, and a sterile drape was applied covering the operative field. A sterile gown and sterile gloves were used for the procedure. Local anesthesia was provided with 1% Lidocaine. Prior to the procedure a timeout was performed. Ultrasound was used  to confirm patency of the right common femoral artery.  Access of the right common femoral artery was performed under ultrasound guidance with a micropuncture set. Ultrasound image documentation was performed.A  5-French sheath was placed. A 5-French Cobra catheter was advanced and used to selectively catheterize the celiac axis. Selective arteriography was performed.  The Cobra catheter was further advanced into the common hepatic artery and selective arteriography performed. A Renegade micro catheter was then advanced through the Cobra catheter a used to selectively catheterize the right gastric artery. Arteriography was performed.  The superior mesenteric artery was catheterized with a 5 [place] Sos catheter. Selective arteriography was performed.  The catheter was removed. Femoral puncture site was assessed with oblique arteriography. Arteriotomy closure was performed with the Cordis ExoSeal device.  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 A left gastric artery is identified which is patent and also supplies accessory left lobe hepatic supply. The splenic artery is normally patent.  A right gastric artery was identified emanating from the lateral aspect of the proper hepatic artery just beyond the gastroduodenal artery. This was catheterized and selective arteriography demonstrates supply to the mid and distal stomach along the lesser curvature. No definite duodenal supply is identified and there is no evidence of arterial contrast extravasation or pseudoaneurysm. Based on distribution of right gastric artery supply as well as the level of the endoscopically confirmed duodenum bulb ulcer, it was felt that embolization of the right gastric artery would not be of benefit in controlling bleeding and would potentially make portions of the stomach ischemic.  Selective arteriography of the superior mesenteric artery demonstrates no evidence of significant pancreaticoduodenal branches supplying the duodenum. No arterial extravasation or pseudoaneurysm is identified. Branches supplying small bowel and the proximal colon  appear normal.  Arteriotomy closure was initially successful in establishing hemostasis.  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 Patent left and right gastric arteries are demonstrated. Selective arteriography of the right gastric artery demonstrated no significant duodenal supply. Embolization of the right gastric artery was not performed. No significant pancreaticoduodenal branches were identified from SMA supply.   Electronically Signed   By: Irish Lack M.D.   On: 10/08/2014 17:45   Ir Angiogram Selective Each Additional Vessel  10/08/2014   CLINICAL DATA:  Large ulcer of the duodenal bulb with bleeding and significant anemia requiring transfusion. History of prior ulceration in this area requiring arteriography and transcatheter embolization of the gastroduodenal artery in October, 2014.  EXAM: 1. ULTRASOUND GUIDANCE FOR VASCULAR ACCESS OF THE RIGHT COMMON FEMORAL ARTERY 2. FIRST ORDER SELECTIVE ARTERIOGRAPHY OF THE CELIAC AXIS 3. SECOND ORDER SELECTIVE ARTERIOGRAPHY OF THE COMMON HEPATIC ARTERY 4. THIRD ORDER SELECTIVE ARTERIOGRAPHY OF THE RIGHT GASTRIC ARTERY 5. FIRST ORDER SELECTIVE ARTERIOGRAPHY OF THE SUPERIOR MESENTERIC ARTERY  FLUOROSCOPY TIME:  10 minutes and 6 seconds.  MEDICATIONS AND MEDICAL HISTORY: 2.0 mg IV Versed; 75 mcg IV Fentanyl.  ANESTHESIA/SEDATION: Moderate sedation time: 46 minutes  CONTRAST:  82 ml Omnipaque-300  PROCEDURE: The procedure, risks, benefits, and alternatives were explained to the patient. Questions regarding the procedure were encouraged and answered. The patient understands and consents to the procedure.  The right groin was prepped with Betadine in a sterile fashion, and a sterile drape was applied covering the operative field. A sterile gown and sterile gloves were used for the procedure. Local anesthesia was provided with 1% Lidocaine. Prior to the procedure  a timeout was performed. Ultrasound  was used to confirm patency of the right common femoral artery.  Access of the right common femoral artery was performed under ultrasound guidance with a micropuncture set. Ultrasound image documentation was performed.A 5-French sheath was placed. A 5-French Cobra catheter was advanced and used to selectively catheterize the celiac axis. Selective arteriography was performed.  The Cobra catheter was further advanced into the common hepatic artery and selective arteriography performed. A Renegade micro catheter was then advanced through the Cobra catheter a used to selectively catheterize the right gastric artery. Arteriography was performed.  The superior mesenteric artery was catheterized with a 5 [place] Sos catheter. Selective arteriography was performed.  The catheter was removed. Femoral puncture site was assessed with oblique arteriography. Arteriotomy closure was performed with the Cordis ExoSeal device.  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 A left gastric artery is identified which is patent and also supplies accessory left lobe hepatic supply. The splenic artery is normally patent.  A right gastric artery was identified emanating from the lateral aspect of the proper hepatic artery just beyond the gastroduodenal artery. This was catheterized and selective arteriography demonstrates supply to the mid and distal stomach along the lesser curvature. No definite duodenal supply is identified and there is no evidence of arterial contrast extravasation or pseudoaneurysm. Based on distribution of right gastric artery supply as well as the level of the endoscopically confirmed duodenum bulb ulcer, it was felt that embolization of the right gastric artery would not be of benefit in controlling bleeding and would potentially make portions of the stomach ischemic.  Selective arteriography of the superior  mesenteric artery demonstrates no evidence of significant pancreaticoduodenal branches supplying the duodenum. No arterial extravasation or pseudoaneurysm is identified. Branches supplying small bowel and the proximal colon appear normal.  Arteriotomy closure was initially successful in establishing hemostasis.  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 Patent left and right gastric arteries are demonstrated. Selective arteriography of the right gastric artery demonstrated no significant duodenal supply. Embolization of the right gastric artery was not performed. No significant pancreaticoduodenal branches were identified from SMA supply.   Electronically Signed   By: Irish Lack M.D.   On: 10/08/2014 17:45   Ir US Guide Vasc Access Right  10/08/2014   CLINICAL DATA:  Large ulcer of the duodenal bulb with bleeding and significant anemia requiring transfusion. History of prior ulceration in this area requiring arteriography and transcatheter embolization of the gastroduodenal artery in October, 2014.  EXAM: 1. ULTRASOUND GUIDANCE FOR VASCULAR ACCESS OF THE RIGHT COMMON FEMORAL ARTERY 2. FIRST ORDER SELECTIVE ARTERIOGRAPHY OF THE CELIAC AXIS 3. SECOND ORDER SELECTIVE ARTERIOGRAPHY OF THE COMMON HEPATIC ARTERY 4. THIRD ORDER SELECTIVE ARTERIOGRAPHY OF THE RIGHT GASTRIC ARTERY 5. FIRST ORDER SELECTIVE ARTERIOGRAPHY OF THE SUPERIOR MESENTERIC ARTERY  FLUOROSCOPY TIME:  10 minutes and 6 seconds.  MEDICATIONS AND MEDICAL HISTORY: 2.0 mg IV Versed; 75 mcg IV Fentanyl.  ANESTHESIA/SEDATION: Moderate sedation time: 46 minutes  CONTRAST:  82 ml Omnipaque-300  PROCEDURE: The procedure, risks, benefits, and alternatives were explained to the patient. Questions regarding the procedure were encouraged and answered. The patient understands and consents to the procedure.  The right groin was prepped with Betadine in a sterile fashion, and a sterile drape was  applied covering the operative field. A sterile gown and sterile gloves were used for the procedure. Local anesthesia was  provided with 1% Lidocaine. Prior to the procedure a timeout was performed. Ultrasound was used to confirm patency of the right common femoral artery.  Access of the right common femoral artery was performed under ultrasound guidance with a micropuncture set. Ultrasound image documentation was performed.A 5-French sheath was placed. A 5-French Cobra catheter was advanced and used to selectively catheterize the celiac axis. Selective arteriography was performed.  The Cobra catheter was further advanced into the common hepatic artery and selective arteriography performed. A Renegade micro catheter was then advanced through the Cobra catheter a used to selectively catheterize the right gastric artery. Arteriography was performed.  The superior mesenteric artery was catheterized with a 5 [place] Sos catheter. Selective arteriography was performed.  The catheter was removed. Femoral puncture site was assessed with oblique arteriography. Arteriotomy closure was performed with the Cordis ExoSeal device.  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 A left gastric artery is identified which is patent and also supplies accessory left lobe hepatic supply. The splenic artery is normally patent.  A right gastric artery was identified emanating from the lateral aspect of the proper hepatic artery just beyond the gastroduodenal artery. This was catheterized and selective arteriography demonstrates supply to the mid and distal stomach along the lesser curvature. No definite duodenal supply is identified and there is no evidence of arterial contrast extravasation or pseudoaneurysm. Based on distribution of right gastric artery supply as well as the level of the endoscopically confirmed duodenum bulb ulcer, it was  felt that embolization of the right gastric artery would not be of benefit in controlling bleeding and would potentially make portions of the stomach ischemic.  Selective arteriography of the superior mesenteric artery demonstrates no evidence of significant pancreaticoduodenal branches supplying the duodenum. No arterial extravasation or pseudoaneurysm is identified. Branches supplying small bowel and the proximal colon appear normal.  Arteriotomy closure was initially successful in establishing hemostasis.  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 Patent left and right gastric arteries are demonstrated. Selective arteriography of the right gastric artery demonstrated no significant duodenal supply. Embolization of the right gastric artery was not performed. No significant pancreaticoduodenal branches were identified from SMA supply.   Electronically Signed   By: Irish Lack M.D.   On: 10/08/2014 17:45   Portable Chest Xray  10/10/2014   CLINICAL DATA:  Respiratory failure.  EXAM: PORTABLE CHEST - 1 VIEW  COMPARISON:  10/09/2014.  08/09/2012 .  FINDINGS: Tracheostomy tube, right IJ line in stable position. Mediastinal structures are stable. Stable left apical pleural-parenchymal thickening consistent with scarring. Mild left base subsegmental atelectasis. No acute infiltrate. Heart size normal. No pneumothorax. No acute osseus abnormality.  IMPRESSION: 1. Line and tube positions are stable. 2. Left apical pleural-parenchymal thickening consistent with scarring. Minimal left base subsegmental atelectasis.   Electronically Signed   By: Maisie Fus  Register   On: 10/10/2014 07:28   Dg Chest Port 1 View  10/09/2014   CLINICAL DATA:  Evaluate central line and endotracheal tube placement.  EXAM: PORTABLE CHEST - 1 VIEW  COMPARISON:  10/05/2014  FINDINGS: Endotracheal tube terminates 5.1 cm above carina. Right internal jugular line terminates at  the low SVC.  Midline trachea. Normal heart size. No pleural effusion or pneumothorax. Pleural-parenchymal opacity in the left apex is similar back to 08/09/2012, favored to represent scarring. Mild interstitial thickening. No new consolidation.  IMPRESSION: Appropriate position of endotracheal and  right internal jugular lines.  Chronic left apical pleural-parenchymal opacity, most consistent with scarring.  Peribronchial thickening which may relate to chronic bronchitis or smoking.   Electronically Signed   By: Jeronimo GreavesKyle  Talbot M.D.   On: 10/09/2014 14:24    Anti-infectives: Anti-infectives    None       Assessment/Plan  1.Recurrent GI bleed; s/p oversewing of duodenal ulcer 08/10/12, and embolization of the gastroduodenal artery 05/03/13. -s/p endo yesterday with endoclip placed.  -if rebleds, will need to go to IR for embolization with OR last choice -getting 2 more units of pRBCs right now -will follow  2. Hx of ETOH abuse/ cocaine use/marijuana use. 3. Psychiatric evaluation: pt did not have capacity 05/01/13. 4. Severe FE deficiency 5. Malnutrition albumin 1.9, total protein, 3.8. 6. Tobacco use 7. Hypokalemia  8. Cocaine positive drug screen   LOS: 2 days    Mylin Hirano E 10/10/2014, 8:29 AM Pager: [phone]

## 2014-10-10 NOTE — Op Note (Signed)
[facility] [address] [place], [id number]   ENDOSCOPY PROCEDURE REPORT  PATIENT: [name], [name]  MR#: [id number] BIRTHDATE: 08/09/1960 , 54  yrs. old GENDER: male ENDOSCOPIST: Beverley FiedlerJay M Desjuan Stearns, MD REFERRED BY:  Lupita Leashouglas B McQuaid, M.D. PROCEDURE DATE:  10/09/2014 PROCEDURE:  EGD w/ control of bleeding ASA CLASS:     Class IV INDICATIONS:  melena, hematemesis, and known duodenal bulb ulcer. MEDICATIONS: per ICU team (fentanyl and propofol infusion) TOPICAL ANESTHETIC: none  DESCRIPTION OF PROCEDURE: After the risks benefits and alternatives of the procedure were thoroughly explained, informed consent was obtained.  The endoscope endoscope was introduced through the mouth and advanced to the second portion of the duodenum , Without limitations.  The instrument was slowly withdrawn as the mucosa was fully examined.  ESOPHAGUS: The mucosa of the esophagus appeared normal.  STOMACH: Blood in stomach, but no source of active bleeding in the stomach.  DUODENUM: A large and deep ulcer with active oozing of blood and a very large visible vessel was found in the duodenal bulb.  Given life-threatening bleeding over the last several days and previous angiography without target vessel the decision was made to apply hemoclip to base of visible vessel (felt to be a large artery). Hemostasis was attempted by placing a single [place] Resolution hemoclip on the base of the visible vessel.  This was successful without immediate bleeding.  The second part of the duodenum contained fresh blood but no other lesions seen.  Retroflexion was not performed.     The scope was then withdrawn from the patient and the procedure completed.  COMPLICATIONS: There were no immediate complications.  ENDOSCOPIC IMPRESSION: 1.   The mucosa of the esophagus appeared normal 2.   Blood in stomach, but no source of active bleeding in the stomach 3.   Large ulcer again seen in duodenal bulb with  large visible vessel; Hemoclip placed at base of visible vessel  RECOMMENDATIONS: 1.  Continue PPI infusion for at least 72 hours given high risk for rebleeding 2.  Closely monitor hemoglobin, transfuse as necessary 3.  Involve interventional radiology and/or surgery immediately for rebleeding   eSigned:  Beverley FiedlerJay M Sandia Pfund, MD 10/10/2014 2:14 PM

## 2014-10-10 NOTE — Anesthesia Preprocedure Evaluation (Addendum)
Anesthesia Evaluation  Patient identified by MRN, date of birth, ID band Patient awake    Reviewed: Allergy & Precautions, NPO status , Patient's Chart, lab work & pertinent test results  Airway Mallampati: II  TM Distance: >3 FB Neck ROM: Full    Dental no notable dental hx. (+) Poor Dentition, Dental Advisory Given   Pulmonary Current Smoker,  breath sounds clear to auscultation  Pulmonary exam normal       Cardiovascular hypertension, Pt. on medications Rhythm:Regular Rate:Normal     Neuro/Psych PSYCHIATRIC DISORDERS negative neurological ROS     GI/Hepatic PUD, GERD-  Medicated,(+)     substance abuse  cocaine use,   Endo/Other  negative endocrine ROS  Renal/GU negative Renal ROS     Musculoskeletal negative musculoskeletal ROS (+)   Abdominal   Peds  Hematology  (+) anemia ,   Anesthesia Other Findings   Reproductive/Obstetrics negative OB ROS                            Anesthesia Physical  Anesthesia Plan  ASA: IV and emergent  Anesthesia Plan: General   Post-op Pain Management:    Induction: Intravenous, Rapid sequence and Cricoid pressure planned  Airway Management Planned: Oral ETT  Additional Equipment: CVP and Arterial line  Intra-op Plan:   Post-operative Plan: Extubation in OR  Informed Consent: I have reviewed the patients History and Physical, chart, labs and discussed the procedure including the risks, benefits and alternatives for the proposed anesthesia with the patient or authorized representative who has indicated his/her understanding and acceptance.   Dental advisory given  Plan Discussed with: CRNA  Anesthesia Plan Comments:       Anesthesia Quick Evaluation

## 2014-10-10 NOTE — Transfer of Care (Signed)
Immediate Anesthesia Transfer of Care Note  Patient: [name]  Procedure(s) Performed: Procedure(s): EXPLORATORY LAPAROTOMY WITH GASTROPATHY AND OVER SEWEN DUODENAL ULCER REGION  (N/A)  Patient Location: PACU and ICU  Anesthesia Type:General  Level of Consciousness: sedated  Airway & Oxygen Therapy: Patient remains intubated per anesthesia plan  Post-op Assessment: Report given to RN and Post -op Vital signs reviewed and stable  Post vital signs: Reviewed and stable  Last Vitals:  Filed Vitals:   10/10/14 1320  BP: 100/52  Pulse: 102  Temp:   Resp: 20    Complications: No apparent anesthesia complications

## 2014-10-10 NOTE — Progress Notes (Addendum)
PARENTERAL NUTRITION CONSULT NOTE - INITIAL  Pharmacy Consult for TPN Indication: Massive GIB and needs bowel rest  Allergies  Allergen Reactions  . Chocolate Nausea And Vomiting  . Peanut-Containing Drug Products Nausea And Vomiting  . Spinach Nausea And Vomiting    Patient Measurements: Height: 5\' 9"  (175.3 cm) Weight: 171 lb 8.3 oz (77.8 kg) IBW/kg (Calculated) : 70.7 Adjusted Body Weight:  Usual Weight:   Vital Signs: Temp: 98.2 F (36.8 C) (03/11 1300) Temp Source: Oral (03/11 1300) BP: 100/52 mmHg (03/11 1320) Pulse Rate: 102 (03/11 1320) Intake/Output from previous day: 03/10 0701 - 03/11 0700 In: 3425.1 [I.V.:1878.1; Blood:1517] Out: 550 [Urine:550] Intake/Output from this shift: Total I/O In: 10409.9 [P.O.:750; I.V.:4718.5; Blood:3942.3; IV Piggyback:999] Out: 790 [Urine:300; Emesis/NG output:140; Stool:350]  Labs:  Recent Labs  10/08/14 1001  10/10/14 0001 10/10/14 0645 10/10/14 1515  WBC  --   < > 16.7* 14.2* PENDING  HGB  --   < > 6.8* 5.8* 10.8*  HCT  --   < > 20.1* 17.1* 32.0*  PLT  --   < > 71* 73* 28*  APTT 26  --   --   --   --   INR 1.14  --   --   --   --   < > = values in this interval not displayed.   Recent Labs  10/08/14 0954 10/09/14 1455 10/10/14 0530  NA 134* 141 142  K 3.4* 4.2 3.8  CL 108 120* 119*  CO2 20 21 21   GLUCOSE 138* 170* 100*  BUN 30* 32* 29*  CREATININE 1.32 1.18 1.26  CALCIUM 7.3* 6.7* 7.1*  PROT 3.8*  --   --   ALBUMIN 2.1*  --   --   AST 26  --   --   ALT 13  --   --   ALKPHOS 30*  --   --   BILITOT 0.2*  --   --    Estimated Creatinine Clearance: 67 mL/min (by C-G formula based on Cr of 1.26).   No results for input(s): GLUCAP in the last 72 hours.  Medical History: Past Medical History  Diagnosis Date  . Abdominal pain, other specified site   . GERD (gastroesophageal reflux disease)   . Abnormal CT scan, esophagus 03/2012  . Substance abuse 03/2012    tox screen positive for cocaine.      Medications:  Scheduled:  . sodium chloride   Intravenous Once  . sodium chloride   Intravenous Once  . sodium chloride   Intravenous Once  . sodium chloride   Intravenous Once  . cefOXitin  2 g Intravenous 3 times per day   Infusions:  . dextrose 5 % and 0.45% NaCl 100 mL/hr at 10/10/14 1040  . pantoprozole (PROTONIX) infusion 8 mg/hr (10/10/14 0730)   Insulin Requirements:  Not currently ordered  Current Nutrition: NPO  IVF: D545 at 14400ml/hr  Central access: triple lumen CVC  TPN start date: 3/12  ASSESSMENT  HPI: 89 YOM presents with melena and near-syncope.  Has h/o EtOH and cocaine abuse w/ previous UGIB d/t ulcers.  EGD 3/7 revealed large non-bleeding duodenal bulb ulcer.  3/9 IR angiogram did not identify bleeding vessel.  Patient intubated on 3/10 and EGD performed w/o evidence of active bleed and hemoclip placed on visible vessel.  He was extubated 3/11 and developed abdominal pain and vomited BRB x 3.  Taken to OR for ex lap and oversewing of ulcer.  J-P drain was placed.  Orders to start TPN d/t massive GIB and need for bowel rest.   Significant events:  3/11: ex lap for GI Bleed with oversewing of ulcer  Today:    Glucose -  Elevated on BMP  Electrolytes - Chloride elevated, Ca = 7 but corrects to 8.7 (WNL) using albumin from 3/6,  Mg = 1.5  Renal - Scr WNL  LFTs - WNL on 3/6 (note h/o EtOHism),   TGs - pending 3/12 - started propofol post-op  Prealbumin - pending 3/12  NUTRITIONAL GOALS                                                                                             RD recs: Pending Estimated needs: 98-105gm protein and 1925kcals Clinimix 5/15 at a goal rate of 83.ml/hr + 20% fat emulsion at 4ml/hr to provide: 100g/day protein, 1894Kcal/day.  PLAN                                                                                                                           Due to timing of consult and awaiting for labs to return - will wait until 3/12 to start TPN  Labs ordered for 3/12 am  Replace magnesium, magnesium sulfate 2gm x 1 IVPB  Consulted RD for new TPN (note started 3/10 but not seen d/t OR)  Start q6h sensitive sliding scale  Adjust MIVF for start of TPN 3/12  Monitor propofol rate to determine need for additional lipids   Juliette Alcide, PharmD, BCPS.   Pager: [phone]  10/10/2014, 7:41 PM

## 2014-10-10 NOTE — Progress Notes (Signed)
Patient extubated to a 2lpm [place] without difficulty. Orally suctioned, cuff deflated, and O2 sat.99%, RR 18, HR 99. Able to verbalize well post extubation. Ventilator stripped down and removed from room.

## 2014-10-10 NOTE — Progress Notes (Signed)
At approx 1230 patient began complaining of abdominal pain. Patient then vomited BRB. Anders SimmondsPete Babcock NP called. GI called. Blood pressure became hypotensive. Blood products ordered and bolus given. Patient to be taken to surgery immediately. Or called and given report

## 2014-10-10 NOTE — Progress Notes (Signed)
Patient currently in OR. Transported by RN Lissa MerlinMichael Dyneisha Murchison and surgical tech. CRNA and OR RN given update and report. Aware of first unit of blood patient is currently receiving. Second unit of blood given to OR nurse by ICU RN. Also told of Bradycardia and of the two FFP ordered.

## 2014-10-10 NOTE — Progress Notes (Signed)
[facility] Progress Note  Subjective:   S/p egd , large vessel seen in ulcer bed in duodenum. Not actively bleeding, but endo clip applied.   Objective:  Vital signs in last 24 hours: Temp:  [97.4 F (36.3 C)-98.6 F (37 C)] 97.9 F (36.6 C) (03/11 0658) Pulse Rate:  [67-116] 76 (03/11 0730) Resp:  [15-25] 19 (03/11 0730) BP: (81-137)/(29-95) 137/95 mmHg (03/11 0730) SpO2:  [99 %-100 %] 100 % (03/11 0730) FiO2 (%):  [30 %-100 %] 30 % (03/11 0800) Weight:  [171 lb 8.3 oz (77.8 kg)] 171 lb 8.3 oz (77.8 kg) (03/11 0430) Last BM Date: 10/09/14 General:   Alert,  Well-developed, intubated Heart:  Regular rate and rhythm; no murmurs Pulm;lungs clear Abdomen:  Soft, nontender and nondistended. Normal bowel sounds, without guarding, and without rebound.   Extremities:  Without edema. Neurologic:  Alert and  oriented x4;  grossly normal neurologically. Psych:  Alert and cooperative. Normal mood and affect.  Intake/Output from previous day: 03/10 0701 - 03/11 0700 In: 3425.1 [I.V.:1878.1; Blood:1517] Out: 550 [Urine:550] Intake/Output this shift: Total I/O In: 191.5 [I.V.:191.5] Out: -   Lab Results:  Recent Labs  10/09/14 1455 10/10/14 0001 10/10/14 0645  WBC 25.4* 16.7* 14.2*  HGB 5.9* 6.8* 5.8*  HCT 17.6* 20.1* 17.1*  PLT 106* 71* 73*   BMET  Recent Labs  10/08/14 0954 10/09/14 1455 10/10/14 0530  NA 134* 141 142  K 3.4* 4.2 3.8  CL 108 120* 119*  CO2 20 21 21   GLUCOSE 138* 170* 100*  BUN 30* 32* 29*  CREATININE 1.32 1.18 1.26  CALCIUM 7.3* 6.7* 7.1*   LFT  Recent Labs  10/08/14 0954  PROT 3.8*  ALBUMIN 2.1*  AST 26  ALT 13  ALKPHOS 30*  BILITOT 0.2*   PT/INR  Recent Labs  10/08/14 1001  LABPROT 14.7  INR 1.14     Ir Angiogram Visceral Selective  10/08/2014   CLINICAL DATA:  Large ulcer of the duodenal bulb with bleeding and significant anemia requiring transfusion. History of prior ulceration in this area requiring  arteriography and transcatheter embolization of the gastroduodenal artery in October, 2014.  EXAM: 1. ULTRASOUND GUIDANCE FOR VASCULAR ACCESS OF THE RIGHT COMMON FEMORAL ARTERY 2. FIRST ORDER SELECTIVE ARTERIOGRAPHY OF THE CELIAC AXIS 3. SECOND ORDER SELECTIVE ARTERIOGRAPHY OF THE COMMON HEPATIC ARTERY 4. THIRD ORDER SELECTIVE ARTERIOGRAPHY OF THE RIGHT GASTRIC ARTERY 5. FIRST ORDER SELECTIVE ARTERIOGRAPHY OF THE SUPERIOR MESENTERIC ARTERY  FLUOROSCOPY TIME:  10 minutes and 6 seconds.  MEDICATIONS AND MEDICAL HISTORY: 2.0 mg IV Versed; 75 mcg IV Fentanyl.  ANESTHESIA/SEDATION: Moderate sedation time: 46 minutes  CONTRAST:  82 ml Omnipaque-300  PROCEDURE: The procedure, risks, benefits, and alternatives were explained to the patient. Questions regarding the procedure were encouraged and answered. The patient understands and consents to the procedure.  The right groin was prepped with Betadine in a sterile fashion, and a sterile drape was applied covering the operative field. A sterile gown and sterile gloves were used for the procedure. Local anesthesia was provided with 1% Lidocaine. Prior to the procedure a timeout was performed. Ultrasound was used to confirm patency of the right common femoral artery.  Access of the right common femoral artery was performed under ultrasound guidance with a micropuncture set. Ultrasound image documentation was performed.A 5-French sheath was placed. A 5-French Cobra catheter was advanced and used to selectively catheterize the celiac axis. Selective arteriography was performed.  The Cobra catheter was further  advanced into the common hepatic artery and selective arteriography performed. A Renegade micro catheter was then advanced through the Cobra catheter a used to selectively catheterize the right gastric artery. Arteriography was performed.  The superior mesenteric artery was catheterized with a 5 [place] Sos catheter. Selective arteriography was performed.  The catheter was  removed. Femoral puncture site was assessed with oblique arteriography. Arteriotomy closure was performed with the Cordis ExoSeal device.  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 A left gastric artery is identified which is patent and also supplies accessory left lobe hepatic supply. The splenic artery is normally patent.  A right gastric artery was identified emanating from the lateral aspect of the proper hepatic artery just beyond the gastroduodenal artery. This was catheterized and selective arteriography demonstrates supply to the mid and distal stomach along the lesser curvature. No definite duodenal supply is identified and there is no evidence of arterial contrast extravasation or pseudoaneurysm. Based on distribution of right gastric artery supply as well as the level of the endoscopically confirmed duodenum bulb ulcer, it was felt that embolization of the right gastric artery would not be of benefit in controlling bleeding and would potentially make portions of the stomach ischemic.  Selective arteriography of the superior mesenteric artery demonstrates no evidence of significant pancreaticoduodenal branches supplying the duodenum. No arterial extravasation or pseudoaneurysm is identified. Branches supplying small bowel and the proximal colon appear normal.  Arteriotomy closure was initially successful in establishing hemostasis.  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 Patent left and right gastric arteries are demonstrated. Selective arteriography of the right gastric artery demonstrated no significant duodenal supply. Embolization of the right gastric artery was not performed. No significant pancreaticoduodenal branches were identified from SMA supply.   Electronically Signed   By: Irish LackGlenn  Yamagata M.D.   On: 10/08/2014 17:45    Ir Angiogram Visceral Selective  10/08/2014   CLINICAL DATA:  Large ulcer of the duodenal bulb with bleeding and significant anemia requiring transfusion. History of prior ulceration in this area requiring arteriography and transcatheter embolization of the gastroduodenal artery in October, 2014.  EXAM: 1. ULTRASOUND GUIDANCE FOR VASCULAR ACCESS OF THE RIGHT COMMON FEMORAL ARTERY 2. FIRST ORDER SELECTIVE ARTERIOGRAPHY OF THE CELIAC AXIS 3. SECOND ORDER SELECTIVE ARTERIOGRAPHY OF THE COMMON HEPATIC ARTERY 4. THIRD ORDER SELECTIVE ARTERIOGRAPHY OF THE RIGHT GASTRIC ARTERY 5. FIRST ORDER SELECTIVE ARTERIOGRAPHY OF THE SUPERIOR MESENTERIC ARTERY  FLUOROSCOPY TIME:  10 minutes and 6 seconds.  MEDICATIONS AND MEDICAL HISTORY: 2.0 mg IV Versed; 75 mcg IV Fentanyl.  ANESTHESIA/SEDATION: Moderate sedation time: 46 minutes  CONTRAST:  82 ml Omnipaque-300  PROCEDURE: The procedure, risks, benefits, and alternatives were explained to the patient. Questions regarding the procedure were encouraged and answered. The patient understands and consents to the procedure.  The right groin was prepped with Betadine in a sterile fashion, and a sterile drape was applied covering the operative field. A sterile gown and sterile gloves were used for the procedure. Local anesthesia was provided with 1% Lidocaine. Prior to the procedure a timeout was performed. Ultrasound was used to confirm patency of the right common femoral artery.  Access of the right common femoral artery was performed under ultrasound guidance with a micropuncture set. Ultrasound image documentation was performed.A 5-French sheath was placed. A 5-French Cobra catheter was advanced and used to selectively catheterize the celiac axis. Selective arteriography was performed.  The Cobra catheter was further advanced into the common hepatic artery and selective arteriography performed. A Renegade micro catheter was then advanced through the Cobra catheter a used to  selectively catheterize the right gastric artery. Arteriography was performed.  The superior mesenteric artery was catheterized with a 5 [place] Sos catheter. Selective arteriography was performed.  The catheter was removed. Femoral puncture site was assessed with oblique arteriography. Arteriotomy closure was performed with the Cordis ExoSeal device.  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 A left gastric artery is identified which is patent and also supplies accessory left lobe hepatic supply. The splenic artery is normally patent.  A right gastric artery was identified emanating from the lateral aspect of the proper hepatic artery just beyond the gastroduodenal artery. This was catheterized and selective arteriography demonstrates supply to the mid and distal stomach along the lesser curvature. No definite duodenal supply is identified and there is no evidence of arterial contrast extravasation or pseudoaneurysm. Based on distribution of right gastric artery supply as well as the level of the endoscopically confirmed duodenum bulb ulcer, it was felt that embolization of the right gastric artery would not be of benefit in controlling bleeding and would potentially make portions of the stomach ischemic.  Selective arteriography of the superior mesenteric artery demonstrates no evidence of significant pancreaticoduodenal branches supplying the duodenum. No arterial extravasation or pseudoaneurysm is identified. Branches supplying small bowel and the proximal colon appear normal.  Arteriotomy closure was initially successful in establishing hemostasis.  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 Patent left and right gastric arteries are demonstrated. Selective arteriography of the right gastric artery demonstrated no significant duodenal supply.  Embolization of the right gastric artery was not performed. No significant pancreaticoduodenal branches were identified from SMA supply.   Electronically Signed   By: Irish Lack M.D.   On: 10/08/2014 17:45   Ir Angiogram Selective Each Additional Vessel  10/08/2014   CLINICAL DATA:  Large ulcer of the duodenal bulb with bleeding and significant anemia requiring transfusion. History of prior ulceration in this area requiring arteriography and transcatheter embolization of the gastroduodenal artery in October, 2014.  EXAM: 1. ULTRASOUND GUIDANCE FOR VASCULAR ACCESS OF THE RIGHT COMMON FEMORAL ARTERY 2. FIRST ORDER SELECTIVE ARTERIOGRAPHY OF THE CELIAC AXIS 3. SECOND ORDER SELECTIVE ARTERIOGRAPHY OF THE COMMON HEPATIC ARTERY 4. THIRD ORDER SELECTIVE ARTERIOGRAPHY OF THE RIGHT GASTRIC ARTERY 5. FIRST ORDER SELECTIVE ARTERIOGRAPHY OF THE SUPERIOR MESENTERIC ARTERY  FLUOROSCOPY TIME:  10 minutes and 6 seconds.  MEDICATIONS AND MEDICAL HISTORY: 2.0 mg IV Versed; 75 mcg IV Fentanyl.  ANESTHESIA/SEDATION: Moderate sedation time: 46 minutes  CONTRAST:  82 ml Omnipaque-300  PROCEDURE: The procedure, risks, benefits, and alternatives were explained to the patient. Questions regarding the procedure were encouraged and answered. The patient understands and consents to the procedure.  The right groin was prepped with Betadine in a sterile fashion, and a sterile drape was applied covering the operative field. A sterile gown and sterile gloves were used for the procedure. Local anesthesia was provided with 1% Lidocaine. Prior to the procedure a timeout was performed. Ultrasound was used to confirm patency of the right common femoral artery.  Access of the right common femoral artery was performed under ultrasound guidance with a micropuncture set. Ultrasound image documentation was performed.A 5-French sheath was placed. A 5-French Cobra catheter was advanced and used to selectively catheterize the  celiac axis. Selective  arteriography was performed.  The Cobra catheter was further advanced into the common hepatic artery and selective arteriography performed. A Renegade micro catheter was then advanced through the Cobra catheter a used to selectively catheterize the right gastric artery. Arteriography was performed.  The superior mesenteric artery was catheterized with a 5 [place] Sos catheter. Selective arteriography was performed.  The catheter was removed. Femoral puncture site was assessed with oblique arteriography. Arteriotomy closure was performed with the Cordis ExoSeal device.  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 A left gastric artery is identified which is patent and also supplies accessory left lobe hepatic supply. The splenic artery is normally patent.  A right gastric artery was identified emanating from the lateral aspect of the proper hepatic artery just beyond the gastroduodenal artery. This was catheterized and selective arteriography demonstrates supply to the mid and distal stomach along the lesser curvature. No definite duodenal supply is identified and there is no evidence of arterial contrast extravasation or pseudoaneurysm. Based on distribution of right gastric artery supply as well as the level of the endoscopically confirmed duodenum bulb ulcer, it was felt that embolization of the right gastric artery would not be of benefit in controlling bleeding and would potentially make portions of the stomach ischemic.  Selective arteriography of the superior mesenteric artery demonstrates no evidence of significant pancreaticoduodenal branches supplying the duodenum. No arterial extravasation or pseudoaneurysm is identified. Branches supplying small bowel and the proximal colon appear normal.  Arteriotomy closure was initially successful in establishing hemostasis.  COMPLICATIONS: None  IMPRESSION: Chronic  occlusion of the gastroduodenal artery after prior coil embolization. Patent left and right gastric arteries are demonstrated. Selective arteriography of the right gastric artery demonstrated no significant duodenal supply. Embolization of the right gastric artery was not performed. No significant pancreaticoduodenal branches were identified from SMA supply.   Electronically Signed   By: Irish Lack M.D.   On: 10/08/2014 17:45   Ir Angiogram Selective Each Additional Vessel  10/08/2014   CLINICAL DATA:  Large ulcer of the duodenal bulb with bleeding and significant anemia requiring transfusion. History of prior ulceration in this area requiring arteriography and transcatheter embolization of the gastroduodenal artery in October, 2014.  EXAM: 1. ULTRASOUND GUIDANCE FOR VASCULAR ACCESS OF THE RIGHT COMMON FEMORAL ARTERY 2. FIRST ORDER SELECTIVE ARTERIOGRAPHY OF THE CELIAC AXIS 3. SECOND ORDER SELECTIVE ARTERIOGRAPHY OF THE COMMON HEPATIC ARTERY 4. THIRD ORDER SELECTIVE ARTERIOGRAPHY OF THE RIGHT GASTRIC ARTERY 5. FIRST ORDER SELECTIVE ARTERIOGRAPHY OF THE SUPERIOR MESENTERIC ARTERY  FLUOROSCOPY TIME:  10 minutes and 6 seconds.  MEDICATIONS AND MEDICAL HISTORY: 2.0 mg IV Versed; 75 mcg IV Fentanyl.  ANESTHESIA/SEDATION: Moderate sedation time: 46 minutes  CONTRAST:  82 ml Omnipaque-300  PROCEDURE: The procedure, risks, benefits, and alternatives were explained to the patient. Questions regarding the procedure were encouraged and answered. The patient understands and consents to the procedure.  The right groin was prepped with Betadine in a sterile fashion, and a sterile drape was applied covering the operative field. A sterile gown and sterile gloves were used for the procedure. Local anesthesia was provided with 1% Lidocaine. Prior to the procedure a timeout was performed. Ultrasound was used to confirm patency of the right common femoral artery.  Access of the right common femoral artery was performed under  ultrasound guidance with a micropuncture set. Ultrasound image documentation was performed.A 5-French sheath was placed. A 5-French Cobra catheter  was advanced and used to selectively catheterize the celiac axis. Selective arteriography was performed.  The Cobra catheter was further advanced into the common hepatic artery and selective arteriography performed. A Renegade micro catheter was then advanced through the Cobra catheter a used to selectively catheterize the right gastric artery. Arteriography was performed.  The superior mesenteric artery was catheterized with a 5 [place] Sos catheter. Selective arteriography was performed.  The catheter was removed. Femoral puncture site was assessed with oblique arteriography. Arteriotomy closure was performed with the Cordis ExoSeal device.  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 A left gastric artery is identified which is patent and also supplies accessory left lobe hepatic supply. The splenic artery is normally patent.  A right gastric artery was identified emanating from the lateral aspect of the proper hepatic artery just beyond the gastroduodenal artery. This was catheterized and selective arteriography demonstrates supply to the mid and distal stomach along the lesser curvature. No definite duodenal supply is identified and there is no evidence of arterial contrast extravasation or pseudoaneurysm. Based on distribution of right gastric artery supply as well as the level of the endoscopically confirmed duodenum bulb ulcer, it was felt that embolization of the right gastric artery would not be of benefit in controlling bleeding and would potentially make portions of the stomach ischemic.  Selective arteriography of the superior mesenteric artery demonstrates no evidence of significant pancreaticoduodenal branches supplying the duodenum. No arterial extravasation  or pseudoaneurysm is identified. Branches supplying small bowel and the proximal colon appear normal.  Arteriotomy closure was initially successful in establishing hemostasis.  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 Patent left and right gastric arteries are demonstrated. Selective arteriography of the right gastric artery demonstrated no significant duodenal supply. Embolization of the right gastric artery was not performed. No significant pancreaticoduodenal branches were identified from SMA supply.   Electronically Signed   By: Irish Lack M.D.   On: 10/08/2014 17:45   Ir US Guide Vasc Access Right  10/08/2014   CLINICAL DATA:  Large ulcer of the duodenal bulb with bleeding and significant anemia requiring transfusion. History of prior ulceration in this area requiring arteriography and transcatheter embolization of the gastroduodenal artery in October, 2014.  EXAM: 1. ULTRASOUND GUIDANCE FOR VASCULAR ACCESS OF THE RIGHT COMMON FEMORAL ARTERY 2. FIRST ORDER SELECTIVE ARTERIOGRAPHY OF THE CELIAC AXIS 3. SECOND ORDER SELECTIVE ARTERIOGRAPHY OF THE COMMON HEPATIC ARTERY 4. THIRD ORDER SELECTIVE ARTERIOGRAPHY OF THE RIGHT GASTRIC ARTERY 5. FIRST ORDER SELECTIVE ARTERIOGRAPHY OF THE SUPERIOR MESENTERIC ARTERY  FLUOROSCOPY TIME:  10 minutes and 6 seconds.  MEDICATIONS AND MEDICAL HISTORY: 2.0 mg IV Versed; 75 mcg IV Fentanyl.  ANESTHESIA/SEDATION: Moderate sedation time: 46 minutes  CONTRAST:  82 ml Omnipaque-300  PROCEDURE: The procedure, risks, benefits, and alternatives were explained to the patient. Questions regarding the procedure were encouraged and answered. The patient understands and consents to the procedure.  The right groin was prepped with Betadine in a sterile fashion, and a sterile drape was applied covering the operative field. A sterile gown and sterile gloves were used for the procedure. Local anesthesia was provided with  1% Lidocaine. Prior to the procedure a timeout was performed. Ultrasound was used to confirm patency of the right common femoral artery.  Access of the right common femoral artery was performed under ultrasound guidance with a micropuncture set. Ultrasound image documentation was performed.A 5-French  sheath was placed. A 5-French Cobra catheter was advanced and used to selectively catheterize the celiac axis. Selective arteriography was performed.  The Cobra catheter was further advanced into the common hepatic artery and selective arteriography performed. A Renegade micro catheter was then advanced through the Cobra catheter a used to selectively catheterize the right gastric artery. Arteriography was performed.  The superior mesenteric artery was catheterized with a 5 [place] Sos catheter. Selective arteriography was performed.  The catheter was removed. Femoral puncture site was assessed with oblique arteriography. Arteriotomy closure was performed with the Cordis ExoSeal device.  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 A left gastric artery is identified which is patent and also supplies accessory left lobe hepatic supply. The splenic artery is normally patent.  A right gastric artery was identified emanating from the lateral aspect of the proper hepatic artery just beyond the gastroduodenal artery. This was catheterized and selective arteriography demonstrates supply to the mid and distal stomach along the lesser curvature. No definite duodenal supply is identified and there is no evidence of arterial contrast extravasation or pseudoaneurysm. Based on distribution of right gastric artery supply as well as the level of the endoscopically confirmed duodenum bulb ulcer, it was felt that embolization of the right gastric artery would not be of benefit in controlling bleeding and would potentially make portions of  the stomach ischemic.  Selective arteriography of the superior mesenteric artery demonstrates no evidence of significant pancreaticoduodenal branches supplying the duodenum. No arterial extravasation or pseudoaneurysm is identified. Branches supplying small bowel and the proximal colon appear normal.  Arteriotomy closure was initially successful in establishing hemostasis.  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 Patent left and right gastric arteries are demonstrated. Selective arteriography of the right gastric artery demonstrated no significant duodenal supply. Embolization of the right gastric artery was not performed. No significant pancreaticoduodenal branches were identified from SMA supply.   Electronically Signed   By: Irish Lack M.D.   On: 10/08/2014 17:45   Portable Chest Xray  10/10/2014   CLINICAL DATA:  Respiratory failure.  EXAM: PORTABLE CHEST - 1 VIEW  COMPARISON:  10/09/2014.  08/09/2012 .  FINDINGS: Tracheostomy tube, right IJ line in stable position. Mediastinal structures are stable. Stable left apical pleural-parenchymal thickening consistent with scarring. Mild left base subsegmental atelectasis. No acute infiltrate. Heart size normal. No pneumothorax. No acute osseus abnormality.  IMPRESSION: 1. Line and tube positions are stable. 2. Left apical pleural-parenchymal thickening consistent with scarring. Minimal left base subsegmental atelectasis.   Electronically Signed   By: Maisie Fus  Register   On: 10/10/2014 07:28   Dg Chest Port 1 View  10/09/2014   CLINICAL DATA:  Evaluate central line and endotracheal tube placement.  EXAM: PORTABLE CHEST - 1 VIEW  COMPARISON:  10/05/2014  FINDINGS: Endotracheal tube terminates 5.1 cm above carina. Right internal jugular line terminates at the low SVC.  Midline trachea. Normal heart size. No pleural effusion or pneumothorax. Pleural-parenchymal opacity in the left apex is  similar back to 08/09/2012, favored to represent scarring. Mild interstitial thickening. No new consolidation.  IMPRESSION: Appropriate position of endotracheal and right internal jugular lines.  Chronic left apical pleural-parenchymal opacity, most consistent with scarring.  Peribronchial thickening which may relate to chronic bronchitis or smoking.   Electronically Signed   By: Jeronimo Greaves M.D.   On: 10/09/2014 14:24    ASSESSMENT/PLAN:  54  yo male with GI bleed, s/[p oversewing of duodenal ulcer in Oct 2014, and embolization of gastroduodenal artery in 1-/29014, s/p egd with endo clip placed on vessel in ulcer bed yesterday. Hgb remains at 5.8 today ( rec 2 units prbcs yesterday) Ordered 2 units prbcs for this a.m..Monitor Hgb,transfuse. Continue PPI drip. If has active bleed would consult IR for repeat angiography.     LOS: 2 days   Aylissa Heinemann, Tollie Pizza PA-C 10/10/2014, Pager [phone]

## 2014-10-10 NOTE — Progress Notes (Signed)
PULMONARY / CRITICAL CARE MEDICINE   Name: [name] MRN: [id number] DOB: Sep 13, 1960    ADMISSION DATE:  10/08/2014 CONSULTATION DATE:  10/08/14  REFERRING MD :  Madilyn Hook   CHIEF COMPLAINT:  Melena, near-syncope  INITIAL PRESENTATION: Mr. Nurse is a 54 year old man with a history of ETOH abuse and recurrent UGIB due to ulcers who presented to ED 3/9 with melena and near-syncope. Patient was admitted to [facility] 3/6 with same problem and left AMA 3/8 in am.   STUDIES:  3/7 endoscopy -  Large non-bleeding and deep ulcer with visible vessel in duodenal bulb. Normal mucosa of esophagus and stomach.  3/8 left [facility] AMA 3/9 admitted again to [facility], IR angiogram> no vessel feeding duodenum identified  SIGNIFICANT EVENTS: 3/10 overnight passed bright red blood per rectum,  3/10: EGD. Endo-clipping of visible vessel w/in duodenal ulcer.  3/11 extubated. hgb drifted 1 gm over night.   SUBJECTIVE: No distress. Passed SBT. Extubated  VITAL SIGNS: Temp:  [97.4 F (36.3 C)-98.9 F (37.2 C)] 98.7 F (37.1 C) (03/11 0900) Pulse Rate:  [67-116] 81 (03/11 0908) Resp:  [14-25] 14 (03/11 0908) BP: (81-137)/(29-95) 120/53 mmHg (03/11 0908) SpO2:  [99 %-100 %] 100 % (03/11 0908) FiO2 (%):  [30 %-100 %] 30 % (03/11 0908) Weight:  [77.8 kg (171 lb 8.3 oz)] 77.8 kg (171 lb 8.3 oz) (03/11 0430) HEMODYNAMICS:   VENTILATOR SETTINGS:  On room air  Vent Mode:  [-] CPAP FiO2 (%):  [30 %-100 %] 30 % Set Rate:  [15 bmp] 15 bmp Vt Set:  [600 mL] 600 mL PEEP:  [5 cmH20] 5 cmH20 Pressure Support:  [5 cmH20] 5 cmH20 Plateau Pressure:  [19 cmH20-22 cmH20] 20 cmH20 INTAKE / OUTPUT:  Intake/Output Summary (Last 24 hours) at 10/10/14 0916 Last data filed at 10/10/14 0900  Gross per 24 hour  Intake 3361.51 ml  Output    550 ml  Net 2811.51 ml    PHYSICAL EXAMINATION: General: no distress s/p extubation  Neuro:  Awake and alert, oriented to date, situation, moves all four ext  HEENT:  NCAT,  EOMi Cardiovascular:  RRR, no clear murmur Lungs:  CTA B Abdomen: BS+, soft, tender epigastrum Musculoskeletal:  Normal bulk and tone  Skin:  No rash or skin breakdown  LABS:  CBC  Recent Labs Lab 10/09/14 1455 10/10/14 0001 10/10/14 0645  WBC 25.4* 16.7* 14.2*  HGB 5.9* 6.8* 5.8*  HCT 17.6* 20.1* 17.1*  PLT 106* 71* 73*   Coag's  Recent Labs Lab 10/06/14 0651 10/08/14 1001  APTT  --  26  INR 1.17 1.14   BMET  Recent Labs Lab 10/08/14 0954 10/09/14 1455 10/10/14 0530  NA 134* 141 142  K 3.4* 4.2 3.8  CL 108 120* 119*  CO2 BUN 30* 32* 29*  CREATININE 1.32 1.18 1.26  GLUCOSE 138* 170* 100*   Electrolytes  Recent Labs Lab 10/06/14 0500 10/08/14 0954 10/09/14 1455 10/10/14 0530  CALCIUM 6.9* 7.3* 6.7* 7.1*  MG 2.0  --   --   --   PHOS 2.9  --   --   --    Sepsis Markers  Recent Labs Lab 10/05/14 2044  LATICACIDVEN 2.91*   ABG  Recent Labs Lab 10/09/14 1430  PHART 7.278*  PCO2ART 44.5  PO2ART 460.0*   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  >Has 2 units written for this will bring  total as of 3/11 (8 units PRBC and 2 FFP)  P:  Complete transfusions Trend CBC q6h VTE prophylaxis - SCDs -->when he will agree to them  NEUROLOGIC/PSYCHE A:   ETOH abuse   H/o polysubstance abuse  History of Axis 1 Psychosis NOS per 2014  P:   Monitor for withdrawal Thiamine, folic acid   PULMONARY OETT A: Acute respiratory failure-->resolved.  Passed SBT & extubated this am P:   Smoking cessation Monitor O2 saturation  CARDIOVASCULAR A:  Tachycardia due to acute blood loss  Near syncope due to symptomatic anemia  P:  Keep even volume status  Tele  RENAL A:  Asymptomatic hypocalcemia s/p multiple transfusions  P:   Monitor BMET and UOP Replace electrolytes as needed Ck ionized Ca. Replace if indicated  INFECTIOUS A:  Leukocytosis without evidence of infection  P:   Monitor for fever  ENDOCRINE A:  No active issues P:   Trend glucose on BMET   FAMILY  - Updates: called brother Nicanor AlconRoger Lottes for update 3/10 AM   TODAY'S SUMMARY:  S/p endoclipping via EGD 3/10. Hgb drifted down a gm overnight but seems stable. He is getting 2 more units of blood. Extubated him this am. Hope he will stay for monitoring. Ideally serial cbc to ensure no further bleeding, and only then slow progression of diet. I suspect he will again sign out AMA regardless.   Simonne MartinetPeter E Babcock ACNP-BC [facility] Pulmonary/Critical Care Pager # [phone] OR # [phone] if no answer   10/10/2014, 9:16 AM  PCCM ATTENDING: I have reviewed pt's initial presentation, consultants notes and hospital database in detail.  The above assessment and plan was formulated under my direction.  In summary: Severe UGIB, intubated for EGD and clipping of vessel 3/10.  Extubated and looked great this morning. [facility]. Subsequently he developed recurrent massive hematemesis requiring surgical intervention. Received 7 units PRBCs and 2 unit FFP in OR. Has returned to ICU with post op VDRF.    I have implemented vent stabilization orders. Post op CXR will be reviewed. Sedation orders (propofol and PRN fentanyl) have been placed. Per CCS, TPN to be initiated. He is on PPI infusion and has order for SCDs. Maintenance IVFs have been ordered   35 minutes of independent CCM time was provided by me   Billy Fischeravid Simonds, MD;  PCCM service; Mobile [phone]

## 2014-10-10 NOTE — Progress Notes (Signed)
Was doing well... Then suddenly developed abd pain then hematemesis followed by bloody clots in stool.  Emergent transfusion started. Surgical and IR services mobilize.   Impression/plan Acute UGIB known duodenal ulcer. Now failed efforts w/ clipping Plan To OR for exploratory lap and oversew.   Simonne MartinetPeter E Margan Elias ACNP-BC [facility] Pulmonary/Critical Care Pager # [phone] OR # [phone] if no answer

## 2014-10-11 ENCOUNTER — Inpatient Hospital Stay (HOSPITAL_COMMUNITY): Payer: Medicaid Other

## 2014-10-11 DIAGNOSIS — J9600 Acute respiratory failure, unspecified whether with hypoxia or hypercapnia: Secondary | ICD-10-CM

## 2014-10-11 DIAGNOSIS — R579 Shock, unspecified: Secondary | ICD-10-CM

## 2014-10-11 LAB — CBC
HCT: 25 % — ABNORMAL LOW (ref 39.0–52.0)
HCT: 26.6 % — ABNORMAL LOW (ref 39.0–52.0)
HCT: 28.5 % — ABNORMAL LOW (ref 39.0–52.0)
HEMATOCRIT: 24.7 % — AB (ref 39.0–52.0)
HEMOGLOBIN: 9.9 g/dL — AB (ref 13.0–17.0)
Hemoglobin: 9.2 g/dL — ABNORMAL LOW (ref 13.0–17.0)
Hemoglobin: 8.6 g/dL — ABNORMAL LOW (ref 13.0–17.0)
Hemoglobin: 8.4 g/dL — ABNORMAL LOW (ref 13.0–17.0)
MCH: 29.9 pg (ref 26.0–34.0)
MCH: 30 pg (ref 26.0–34.0)
MCH: 30.1 pg (ref 26.0–34.0)
MCH: 30.2 pg (ref 26.0–34.0)
MCHC: 34 g/dL (ref 30.0–36.0)
MCHC: 34.4 g/dL (ref 30.0–36.0)
MCHC: 34.6 g/dL (ref 30.0–36.0)
MCHC: 34.7 g/dL (ref 30.0–36.0)
MCV: 86.9 fL (ref 78.0–100.0)
MCV: 87.1 fL (ref 78.0–100.0)
MCV: 86.9 fL (ref 78.0–100.0)
MCV: 87.9 fL (ref 78.0–100.0)
PLATELETS: 32 10*3/uL — AB (ref 150–400)
PLATELETS: 45 10*3/uL — AB (ref 150–400)
Platelets: 39 10*3/uL — ABNORMAL LOW (ref 150–400)
Platelets: 82 10*3/uL — ABNORMAL LOW (ref 150–400)
RBC: 2.81 MIL/uL — AB (ref 4.22–5.81)
RBC: 2.87 MIL/uL — AB (ref 4.22–5.81)
RBC: 3.06 MIL/uL — AB (ref 4.22–5.81)
RBC: 3.28 MIL/uL — ABNORMAL LOW (ref 4.22–5.81)
RDW: 14.5 % (ref 11.5–15.5)
RDW: 14.8 % (ref 11.5–15.5)
RDW: 15 % (ref 11.5–15.5)
RDW: 15.1 % (ref 11.5–15.5)
WBC: 13.4 10*3/uL — AB (ref 4.0–10.5)
WBC: 12.5 10*3/uL — ABNORMAL HIGH (ref 4.0–10.5)
WBC: 12.5 10*3/uL — AB (ref 4.0–10.5)
WBC: 12.6 10*3/uL — ABNORMAL HIGH (ref 4.0–10.5)

## 2014-10-11 LAB — COMPREHENSIVE METABOLIC PANEL
ALT: 26 U/L (ref 0–53)
ANION GAP: 3 — AB (ref 5–15)
AST: 33 U/L (ref 0–37)
Albumin: 1.7 g/dL — ABNORMAL LOW (ref 3.5–5.2)
Alkaline Phosphatase: 31 U/L — ABNORMAL LOW (ref 39–117)
BUN: 19 mg/dL (ref 6–23)
CO2: 23 mmol/L (ref 19–32)
CREATININE: 1.05 mg/dL (ref 0.50–1.35)
Calcium: 7.1 mg/dL — ABNORMAL LOW (ref 8.4–10.5)
Chloride: 118 mmol/L — ABNORMAL HIGH (ref 96–112)
GFR calc non Af Amer: 79 mL/min — ABNORMAL LOW (ref >90)
Glucose, Bld: 131 mg/dL — ABNORMAL HIGH (ref 70–99)
POTASSIUM: 3.7 mmol/L (ref 3.5–5.1)
SODIUM: 144 mmol/L (ref 135–145)
Total Bilirubin: 0.6 mg/dL (ref 0.3–1.2)
Total Protein: 3.4 g/dL — ABNORMAL LOW (ref 6.0–8.3)

## 2014-10-11 LAB — DIFFERENTIAL
Basophils Absolute: 0 10*3/uL (ref 0.0–0.1)
Basophils Relative: 0 % (ref 0–1)
EOS ABS: 0 10*3/uL (ref 0.0–0.7)
EOS PCT: 0 % (ref 0–5)
Lymphocytes Relative: 6 % — ABNORMAL LOW (ref 12–46)
Lymphs Abs: 0.8 10*3/uL (ref 0.7–4.0)
Monocytes Absolute: 0.9 10*3/uL (ref 0.1–1.0)
Monocytes Relative: 7 % (ref 3–12)
NEUTROS PCT: 86 % — AB (ref 43–77)
Neutro Abs: 10.8 10*3/uL — ABNORMAL HIGH (ref 1.7–7.7)

## 2014-10-11 LAB — PREPARE FRESH FROZEN PLASMA
UNIT DIVISION: 0
UNIT DIVISION: 0
Unit division: 0
Unit division: 0

## 2014-10-11 LAB — PHOSPHORUS: PHOSPHORUS: 3.9 mg/dL (ref 2.3–4.6)

## 2014-10-11 LAB — GLUCOSE, CAPILLARY
GLUCOSE-CAPILLARY: 127 mg/dL — AB (ref 70–99)
GLUCOSE-CAPILLARY: 97 mg/dL (ref 70–99)
Glucose-Capillary: 122 mg/dL — ABNORMAL HIGH (ref 70–99)
Glucose-Capillary: 66 mg/dL — ABNORMAL LOW (ref 70–99)

## 2014-10-11 LAB — MAGNESIUM: MAGNESIUM: 2.2 mg/dL (ref 1.5–2.5)

## 2014-10-11 LAB — CALCIUM, IONIZED: Calcium, Ion: 1.17 mmol/L (ref 1.12–1.32)

## 2014-10-11 LAB — TRIGLYCERIDES: Triglycerides: 67 mg/dL (ref <150)

## 2014-10-11 LAB — PREALBUMIN: Prealbumin: 10.9 mg/dL — ABNORMAL LOW (ref 17.0–34.0)

## 2014-10-11 MED ORDER — SODIUM CHLORIDE 0.9 % IV SOLN
Freq: Once | INTRAVENOUS | Status: AC
  Administered 2014-10-11: 13:00:00 via INTRAVENOUS

## 2014-10-11 MED ORDER — FUROSEMIDE 10 MG/ML IJ SOLN
40.0000 mg | Freq: Once | INTRAMUSCULAR | Status: AC
  Administered 2014-10-11: 40 mg via INTRAVENOUS
  Filled 2014-10-11: qty 4

## 2014-10-11 MED ORDER — SODIUM CHLORIDE 0.9 % IV SOLN
8.0000 mg/h | INTRAVENOUS | Status: AC
  Administered 2014-10-11 – 2014-10-14 (×4): 8 mg/h via INTRAVENOUS
  Filled 2014-10-11 (×15): qty 80

## 2014-10-11 MED ORDER — SODIUM CHLORIDE 0.9 % IV SOLN
3.0000 g | Freq: Four times a day (QID) | INTRAVENOUS | Status: DC
  Administered 2014-10-11 – 2014-10-16 (×20): 3 g via INTRAVENOUS
  Filled 2014-10-11 (×23): qty 3

## 2014-10-11 MED ORDER — TRACE MINERALS CR-CU-F-FE-I-MN-MO-SE-ZN IV SOLN
INTRAVENOUS | Status: AC
  Administered 2014-10-11: 19:00:00 via INTRAVENOUS
  Filled 2014-10-11: qty 960

## 2014-10-11 NOTE — Progress Notes (Addendum)
PULMONARY / CRITICAL CARE MEDICINE   Name: [name] MRN: [id number] DOB: 10/15/1960    ADMISSION DATE:  10/08/2014 CONSULTATION DATE:  10/08/14  REFERRING MD :  Madilyn Hookees   CHIEF COMPLAINT:  Melena, near-syncope  INITIAL PRESENTATION: [name] is a 54 year old man with a history of ETOH abuse and recurrent UGIB due to ulcers who presented to ED 3/9 with melena and near-syncope. Patient [facility] 3[facility]6 with same problem and left AMA 3/8 in am.   STUDIES:  3/7 endoscopy -  Large non-bleeding and deep ulcer with visible vessel in duodenal bulb. Normal mucosa of esophagus and stomach.  [facility] angi[facility]> no vessel feeding duodenum identified  SIGNIFICANT EVENTS: 3/10 overnight passed bright red blood per rectum,  3/10: EGD. Endo-clipping of visible vessel w/in duodenal ulcer.  3/11 extubated. hgb drifted 1 gm over night.  3/11 Exploratory laparotomy,  over sewing of duodenal ulcer and external over sewing of feeder vessels   SUBJECTIVE: Intubated Int agitation Afebrile Dark red blood in NGT  VITAL SIGNS: Temp:  [97.4 F (36.3 C)-99.8 F (37.7 C)] 98.5 F (36.9 C) (03/12 0800) Pulse Rate:  [44-108] 82 (03/12 0800) Resp:  [0-24] 16 (03/12 0800) BP: (86-139)/(37-95) 117/64 mmHg (03/11 2000) SpO2:  [98 %-100 %] 100 % (03/12 0800) Arterial Line BP: (113)/(64) 113/64 mmHg (03/11 1606) FiO2 (%):  [30 %-60 %] 30 % (03/12 0905) Weight:  [89.495 kg (197 lb 4.8 oz)] 89.495 kg (197 lb 4.8 oz) (03/12 0500) HEMODYNAMICS:   VENTILATOR SETTINGS:  On room air  Vent Mode:  [-] PRVC FiO2 (%):  [30 %-60 %] 30 % Set Rate:  [15 bmp] 15 bmp Vt Set:  [600 mL] 600 mL PEEP:  [5 cmH20] 5 cmH20 Plateau Pressure:  [20 cmH20-30 cmH20] 20 cmH20 INTAKE / OUTPUT:  Intake/Output Summary (Last 24 hours) at 10/11/14 0956 Last data filed at 10/11/14 0900  Gross per 24 hour  Intake 13104.81 ml  Output   3565 ml  Net 9539.81 ml    PHYSICAL  EXAMINATION: General: sedated Neuro:  RASS 0,  moves all four ext  HEENT:  NCAT, EOMi Cardiovascular:  RRR, no clear murmur Lungs:  CTA B Abdomen: BS+, soft, tender epigastrum Musculoskeletal:  Normal bulk and tone  Skin:  No rash or skin breakdown  LABS:  CBC  Recent Labs Lab 10/10/14 1720 10/11/14 0015 10/11/14 0510  WBC 14.9* 13.4* 12.5*  HGB 10.9* 9.9* 9.2*  HCT 32.1* 28.5* 26.6*  PLT 26* 32* 39*   Coag's  Recent Labs Lab 10/06/14 0651 10/08/14 1001 10/10/14 1515  APTT  --  26  --   INR 1.17 1.14 1.54*   BMET  Recent Labs Lab 10/10/14 0530 10/10/14 1720 10/11/14 0510  NA 142 142 144  K 3.8 4.2 3.7  CL 119* 119* 118*  CO2 21 22 23   BUN 29* 24* 19  CREATININE 1.26 1.14 1.05  GLUCOSE 100* 168* 131*   Electrolytes  Recent Labs Lab 10/06/14 0500  10/10/14 0530 10/10/14 1720 10/11/14 0510  CALCIUM 6.9*  < > 7.1* 7.0* 7.1*  MG 2.0  --   --  1.5 2.2  PHOS 2.9  --   --  4.4 3.9  < > = values in this interval not displayed. 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, perpetuated by polysubstance abuse (cocaine and EtOH) 3/7 endoscopy with visible vessel, high risk for intervention 3/9 IR embolization could not identify a vessel 3/10 EGD: got ENDO clip of large visible ulcer (not actively bleeding at that  time) 3/11 Exploratory laparotomy, gastrorrhaphy, over sewing of duodenal ulcer and external over sewing of feeder vessels   Plan NPO  Continue Pantoprazole infusion   HEMATOLOGIC A:  Anemia due to acute blood loss  >> total as of 3/11 (8 units PRBC and 2 FFP) Thrombocytopenia P:  Transfuse plts, goal >100  Trend CBC q6h VTE prophylaxis - SCDs   NEUROLOGIC/PSYCHE A:   ETOH abuse   H/o polysubstance abuse  History of Axis 1 Psychosis NOS per 2014  P:   Monitor for withdrawal Thiamine, folic acid   PULMONARY OETT 3/10>> 3/11,  3/11 >> A: Acute respiratory failure Aspiration pna -RLL P:  Defer extubation until certain no bleeding Smoking cessation Monitor O2 saturation  CARDIOVASCULAR A:  Tachycardia due to acute blood loss  Near syncope due to symptomatic anemia  P:  Keep even volume status  Tele  RENAL A:  Asymptomatic hypocalcemia s/p multiple transfusions  P:   Monitor BMET and UOP Replace electrolytes as needed Ck ionized Ca. Replace if indicated  INFECTIOUS A:  Leukocytosis  RLL aspiration P:  Add empiric unasyn   ENDOCRINE A:  No active issues P:   Trend glucose on BMET   FAMILY  - Updates: brother [name]  3/10    TODAY'S SUMMARY:  Severe UGIB, intubated for EGD and clipping of vessel 3/10.   he developed recurrent massive hematemesis requiring surgical intervention. Defer extubation until certain no more bleeding  The patient is critically ill with multiple organ systems failure and requires high complexity decision making for assessment and support, frequent evaluation and titration of therapies, application of advanced monitoring technologies and extensive interpretation of multiple databases. Critical Care Time devoted to patient care services described in this note independent of APP time is 35 minutes.    Oretha Milch MD 10/11/2014, 9:56 AM

## 2014-10-11 NOTE — Op Note (Signed)
NAMEinar Grad:  Koral, Jevan                ACCOUNT NO.:  [id number]  MEDICAL RECORD NO.:  [id number]  LOCATION:  1232                         [facility]  PHYSICIAN:  Thornton ParkMatthew B. Daphine DeutscherMartin, MD  DATE OF BIRTH:  1960/10/10  DATE OF PROCEDURE:  10/10/2014 DATE OF DISCHARGE:                              OPERATIVE REPORT   PREOPERATIVE DIAGNOSIS:  A 54 year old, African American male with a history of prior duodenal ulcer requiring oversew.  The patient has a history of crack cocaine abuse and has presented with intractable ulcer with bleeding.  POSTOPERATIVE DIAGNOSIS:  OPERATION:  Exploratory laparotomy, gastrorrhaphy, over sewing of duodenal ulcer and external over sewing of feeder vessels to be devascularized.  SURGEON:  Thornton ParkMatthew B. Daphine DeutscherMartin, MD.  ASSISTANT:  Letha CapeKelly E Osborne, PA.  DRAINS:  One JP drain.  DESCRIPTION OF PROCEDURE:  The patient was taken to room 1 as an emergency as he had bled down into shock and was having massive hematemesis.  The procedure had been explained to him yesterday and today, and he agreed.  He was taken to OR 1, and after he was intubated and general anesthesia was administered, prepped and draped.  A time-out was performed.  The old incision was entered and the abdomen was entered without too much difficulty.  There were over a lot of adhesions and a pretty well fixed the upper abdomen.  I was able to identify the stomach and then find the region of the pylorus where he had been over sewn before because he had his area mobilized, there was a tremendous amount of scar tissue and this was stuck up to the liver as well as to the bowel and laterally it was very stuck and difficult to mobilize.  Making a longitudinal gastrorrhaphy after putting some sutures above and below and using the Harmonic scalpel, I created this going down to the pylorus and to the area of the stricture ulcer.  I put my finger down on that and could not feel the clip that had been  placed yesterday.  I had to pack off the stomach with packs and also packed off distally to try to expose that area.  I grabbed the edge of the ulcer proximally and put figure-of-eight sutures of 3-0 silks around the perimeter as best I could see.  This reduced the bleeding, but there were still some active bleeding.  I elected, at that point, to go cephalad and go down on top of the duodenum.  What I felt was arterial inflow and over sewed that on the back wall of the crater with a figure-of-eight suture of 2-0 silk. At this point, the bleeding seemed to abate.  I then elected to close the gastrorrhaphy in a transverse manner using 3-0 PDS in a canal Mayo fashion closing it transversely as up pyloroplasty.  This completed one layer and got good closure, but then I then provided a second layer of 3-0 silks.  I placed a drain over the top of that brought it out on the patient's left side.  I then closed the wound with a #1 Novafil interrupted.  The patient was that point reasonably stable and was transferred back to  the intensive care unit in somewhat guarded condition.     Thornton Park Daphine Deutscher, MD     MBM/MEDQ  D:  10/10/2014  T:  10/11/2014  Job:  [id number]

## 2014-10-11 NOTE — Progress Notes (Signed)
PARENTERAL NUTRITION CONSULT NOTE - INITIAL  Pharmacy Consult for TPN Indication: Massive GIB and needs bowel rest  Allergies  Allergen Reactions  . Chocolate Nausea And Vomiting  . Peanut-Containing Drug Products Nausea And Vomiting  . Spinach Nausea And Vomiting    Patient Measurements: Height: 5\' 9"  (175.3 cm) Weight: 197 lb 4.8 oz (89.495 kg) IBW/kg (Calculated) : 70.7 Adjusted Body Weight: 76 kg Usual Weight:   Vital Signs: Temp: 99 F (37.2 C) (03/12 0400) Temp Source: Axillary (03/12 0400) Pulse Rate: 82 (03/12 0800) Intake/Output from previous day: 03/11 0701 - 03/12 0700 In: 13051.3 [P.O.:750; I.V.:7209.9; Blood:3942.3; IV Piggyback:1149] Out: 3290 [Urine:1330; Emesis/NG output:570; Drains:540; Stool:350; Blood:500] Intake/Output from this shift: Total I/O In: 235 [I.V.:125; Other:110] Out: 215 [Urine:100; Drains:115]  Labs:  Recent Labs  10/08/14 1001  10/10/14 1515 10/10/14 1720 10/11/14 0015 10/11/14 0510  WBC  --   < > 15.9* 14.9* 13.4* 12.5*  HGB  --   < > 10.8* 10.9* 9.9* 9.2*  HCT  --   < > 32.0* 32.1* 28.5* 26.6*  PLT  --   < > 28* 26* 32* 39*  APTT 26  --   --   --   --   --   INR 1.14  --  1.54*  --   --   --   < > = values in this interval not displayed.   Recent Labs  10/08/14 0954  10/10/14 0530 10/10/14 1720 10/11/14 0510  NA 134*  < > 142 142 144  K 3.4*  < > 3.8 4.2 3.7  CL 108  < > 119* 119* 118*  CO2 20  < > 21 22 23   GLUCOSE 138*  < > 100* 168* 131*  BUN 30*  < > 29* 24* 19  CREATININE 1.32  < > 1.26 1.14 1.05  CALCIUM 7.3*  < > 7.1* 7.0* 7.1*  MG  --   --   --  1.5 2.2  PHOS  --   --   --  4.4 3.9  PROT 3.8*  --   --   --  3.4*  ALBUMIN 2.1*  --   --   --  1.7*  AST 26  --   --   --  33  ALT 13  --   --   --  26  ALKPHOS 30*  --   --   --  31*  BILITOT 0.2*  --   --   --  0.6  < > = values in this interval not displayed. Estimated Creatinine Clearance: 89 mL/min (by C-G formula based on Cr of 1.05).    Recent  Labs  10/11/14 0010 10/11/14 0735  GLUCAP 127* 122*    Medical History: Past Medical History  Diagnosis Date  . Abdominal pain, other specified site   . GERD (gastroesophageal reflux disease)   . Abnormal CT scan, esophagus 03/2012  . Substance abuse 03/2012    tox screen positive for cocaine.     Medications:  Scheduled:  . sodium chloride   Intravenous Once  . sodium chloride   Intravenous Once  . sodium chloride   Intravenous Once  . sodium chloride   Intravenous Once  . antiseptic oral rinse  7 mL Mouth Rinse QID  . cefOXitin  2 g Intravenous 3 times per day  . chlorhexidine  15 mL Mouth Rinse BID  . insulin aspart  0-9 Units Subcutaneous 4 times per day   Infusions:  .  dextrose 5 % and 0.45% NaCl 100 mL/hr at 10/10/14 1040  . pantoprozole (PROTONIX) infusion 8 mg/hr (10/10/14 2000)  . propofol 30 mcg/kg/min (10/11/14 0800)   Insulin Requirements:  2 units Novolog sensitive scale  Current Nutrition: NPO  IVF: D545 at 152ml/hr  Central access: triple lumen CVC  TPN start date: 3/12  ASSESSMENT                                                                                                          HPI: 13 YOM presents with melena and near-syncope.  Has h/o EtOH and cocaine abuse w/ previous UGIB d/t ulcers.  EGD 3/7 revealed large non-bleeding duodenal bulb ulcer.  3/9 IR angiogram did not identify bleeding vessel.  Patient intubated on 3/10 and EGD performed w/o evidence of active bleed and hemoclip placed on visible vessel.  He was extubated 3/11 and developed abdominal pain and vomited BRB x 3.  Taken to OR for ex lap and oversewing of ulcer.  J-P drain was placed.  Orders to start TPN d/t massive GIB and need for bowel rest.   Significant events:  3/11: ex lap for GI Bleed with oversewing of ulcer 3/12: remains sedated on vent, propofol currently at 30 mcg/kg/min = 15kcal/hr = 360kcal/day if this rate continues  Today: 10/11/2014   Glucose - at goal <  150  Electrolytes - chloride elevated, Ca = 7.1 but corrects to 8.91 (WNL) using albumin 1.7. Mg wnl after replacement last PM.     Renal - Scr improved to 1.05  LFTs - WNL on 3/6 (note h/o EtOHism),   TGs - pending 3/12 - started propofol post-op  Prealbumin - pending 3/12  NUTRITIONAL GOALS                                                                                             RD recs: Pending Estimated needs: 98-105gm protein and 1925kcals Clinimix 5/15 at a goal rate of 83.ml/hr + 20% fat emulsion at 56ml/hr to provide: 100g/day protein, 1894Kcal/day.  PLAN  At 18:00 tonight:  Start Clinimix E 5/15 at 40 ml/hr  NO LIPIDS DUE TO CURRENT PROPOFOL RATE  Plan to advance to goal rate as tolerated  TPN to contain standard MVI and trace elements daily  F/u RD consult recommendations  Continue CBC q4h sensitive sliding scale  Adjust MIVF to 49ml/hr to maintain 155ml/hr total when TPN starts tonight  Monitor propofol rate to determine need for additional lipids   TPN labs every Monday and Thursday  F/u daily  Loralee Pacas, PharmD, BCPS Pager: [phone] 10/11/2014, 9:09 AM

## 2014-10-11 NOTE — Progress Notes (Signed)
ANTIBIOTIC CONSULT NOTE - INITIAL  Pharmacy Consult for Unasyn Indication: aspiration pneumonia  Allergies  Allergen Reactions  . Chocolate Nausea And Vomiting  . Peanut-Containing Drug Products Nausea And Vomiting  . Spinach Nausea And Vomiting    Patient Measurements: Height: 5\' 9"  (175.3 cm) Weight: 197 lb 4.8 oz (89.495 kg) IBW/kg (Calculated) : 70.7 Adjusted Body Weight: n/a  Vital Signs: Temp: 98.5 F (36.9 C) (03/12 0800) Temp Source: Oral (03/12 0800) Pulse Rate: 72 (03/12 1000) Intake/Output from previous day: 03/11 0701 - 03/12 0700 In: 13051.3 [P.O.:750; I.V.:7209.9; Blood:3942.3; IV Piggyback:1149] Out: 3290 [Urine:1330; Emesis/NG output:570; Drains:540; Stool:350; Blood:500] Intake/Output from this shift: Total I/O In: 370 [I.V.:250; Other:120] Out: 275 [Urine:100; Drains:175]  Labs:  Recent Labs  10/10/14 0530  10/10/14 1720 10/11/14 0015 10/11/14 0510  WBC  --   < > 14.9* 13.4* 12.5*  HGB  --   < > 10.9* 9.9* 9.2*  PLT  --   < > 26* 32* 39*  CREATININE 1.26  --  1.14  --  1.05  < > = values in this interval not displayed. Estimated Creatinine Clearance: 89 mL/min (by C-G formula based on Cr of 1.05). No results for input(s): VANCOTROUGH, VANCOPEAK, VANCORANDOM, GENTTROUGH, GENTPEAK, GENTRANDOM, TOBRATROUGH, TOBRAPEAK, TOBRARND, AMIKACINPEAK, AMIKACINTROU, AMIKACIN in the last 72 hours.   Microbiology: Recent Results (from the past 720 hour(s))  MRSA PCR Screening     Status: None   Collection Time: 10/05/14 11:53 PM  Result Value Ref Range Status   MRSA by PCR NEGATIVE NEGATIVE Final    Comment:        The GeneXpert MRSA Assay (FDA approved for NASAL specimens only), is one component of a comprehensive MRSA colonization surveillance program. It is not intended to diagnose MRSA infection nor to guide or monitor treatment for MRSA infections.   MRSA PCR Screening     Status: None   Collection Time: 10/08/14  2:02 PM  Result Value Ref  Range Status   MRSA by PCR NEGATIVE NEGATIVE Final    Comment:        The GeneXpert MRSA Assay (FDA approved for NASAL specimens only), is one component of a comprehensive MRSA colonization surveillance program. It is not intended to diagnose MRSA infection nor to guide or monitor treatment for MRSA infections. Performed [facility]     Medical History: Past Medical History  Diagnosis Date  . Abdominal pain, other specified site   . GERD (gastroesophageal reflux disease)   . Abnormal CT scan, esophagus 03/2012  . Substance abuse 03/2012    tox screen positive for cocaine.     Medications:  Scheduled:  . sodium chloride   Intravenous Once  . sodium chloride   Intravenous Once  . sodium chloride   Intravenous Once  . sodium chloride   Intravenous Once  . ampicillin-sulbactam (UNASYN) IV  3 g Intravenous Q6H  . antiseptic oral rinse  7 mL Mouth Rinse QID  . chlorhexidine  15 mL Mouth Rinse BID  . insulin aspart  0-9 Units Subcutaneous 4 times per day   Infusions:  . dextrose 5 % and 0.45% NaCl 100 mL/hr at 10/10/14 1040  . propofol 30 mcg/kg/min (10/11/14 0900)  . Marland Kitchen.TPN (CLINIMIX-E) Adult     PRN: [email or web address] Anti-infectives    Start     Dose/Rate Route Frequency Ordered Stop   10/11/14 1200  Ampicillin-Sulbactam (UNASYN) 3 g in sodium chloride 0.9 % 100 mL IVPB     3 g 100  mL/hr over 60 Minutes Intravenous Every 6 hours 10/11/14 1031     10/10/14 2200  cefOXitin (MEFOXIN) 2 g in dextrose 5 % 50 mL IVPB  Status:  Discontinued     2 g 100 mL/hr over 30 Minutes Intravenous 3 times per day 10/10/14 1601 10/11/14 1030     Assessment 54yoM with with a history of ETOH abuse and recurrent UGIB due to ulcers who presented to ED 3/9 with melena and near-syncope. Patient was admitted to [facility] 3/6 with same problem and left AMA 3/8 in am.  Remains on vent until bleeding resolved, but now noted with persistent BL interstitial infiltrates on CXR; PCCM concerned for  RLL aspiration.  To begin Unasyn per pharmacy.  On cefoxitin q8h postoperatively with last dose at 5:45 this AM  3/12 >> unasyn >>   Temp: Afebrile WBC: 12.5 Renal: SCr 1.05; CrCl 89 CG SpO2: FiO2 30%  3/9 MRSA screen: neg 3/12 trach aspirate: IP CXR 3/12: persistent BL   Goal of Therapy:   Eradication of infection  Appropriate antibiotic dosing for indication and renal function  Plan:   Discontinue cefoxitin  Unasyn 3g IV q6hr.  Will begin at 12:00 today as received post-op cefoxitin this AM (next dose would be due at 14:00)  Follow clinical course, culture results as available, renal function  Follow for de-escalation of antibiotics and LOT   Bernadene Person, PharmD Pager: [phone] 10/11/2014, 11:29 AM

## 2014-10-11 NOTE — Anesthesia Postprocedure Evaluation (Signed)
Anesthesia Post Note  Patient: [name]  Procedure(s) Performed: Procedure(s) (LRB): EXPLORATORY LAPAROTOMY WITH GASTROPATHY AND OVER SEWEN DUODENAL ULCER REGION  (N/A)  Anesthesia type: General  Patient location: ICU  Post pain: Pain level controlled  Post assessment: Post-op Vital signs reviewed  Last Vitals: BP 117/64 mmHg  Pulse 81  Temp(Src) 37.3 C (Oral)  Resp 0  Ht 5\' 9"  (1.753 m)  Wt 197 lb 4.8 oz (89.495 kg)  BMI 29.12 kg/m2  SpO2 100%  Post vital signs: Reviewed  Level of consciousness: sedated  Complications: No apparent anesthesia complications

## 2014-10-11 NOTE — Progress Notes (Signed)
Patient ID: [name], male   DOB: [birth year], 54 y.o.   MRN: [id number] [facility] Progress Note:   1 Day Post-Op  Subjective: Mental status is sedated on ventilator but patient opened eyes and squeezed my hand Objective: Vital signs in last 24 hours: Temp:  [97.4 F (36.3 C)-99.8 F (37.7 C)] 99 F (37.2 C) (03/12 0400) Pulse Rate:  [44-108] 82 (03/12 0800) Resp:  [0-24] 16 (03/12 0800) BP: (86-139)/(37-95) 117/64 mmHg (03/11 2000) SpO2:  [98 %-100 %] 100 % (03/12 0800) Arterial Line BP: (113)/(64) 113/64 mmHg (03/11 1606) FiO2 (%):  [30 %-60 %] 30 % (03/12 0355) Weight:  [197 lb 4.8 oz (89.495 kg)] 197 lb 4.8 oz (89.495 kg) (03/12 0500)  Intake/Output from previous day: 03/11 0701 - 03/12 0700 In: 13051.3 [P.O.:750; I.V.:7209.9; Blood:3942.3; IV [id number]:8527] Out: 7824 [Urine:1330; Emesis/NG output:570; Drains:540; Stool:350; Blood:500] Intake/Output this shift:    Physical Exam: Work of breathing is determined by ventilator.    Lab Results:  Results for orders placed or performed during the hospital encounter of 10/08/14 (from the past 48 hour(s))  Prepare RBC     Status: None   Collection Time: 10/09/14  1:09 PM  Result Value Ref Range   Order Confirmation ORDER PROCESSED BY BLOOD BANK   Prepare fresh frozen plasma     Status: None   Collection Time: 10/09/14  1:09 PM  Result Value Ref Range   Unit Number [id number]    Blood Component Type THAWED PLASMA    Unit division 00    Status of Unit ISSUED,FINAL    Transfusion Status OK TO TRANSFUSE    Unit Number [id number]    Blood Component Type THAWED PLASMA    Unit division 00    Status of Unit ISSUED,FINAL    Transfusion Status OK TO TRANSFUSE   Draw ABG 1 hour after initiation of ventilator     Status: Abnormal   Collection Time: 10/09/14  2:30 PM  Result Value Ref Range   FIO2 1.00 %   Delivery systems VENTILATOR    Mode PRESSURE REGULATED VOLUME CONTROL    VT 600 mL   Rate 15 resp/min    Peep/cpap 5.0 cm H20   pH, Arterial 7.278 (L) 7.350 - 7.450   pCO2 arterial 44.5 35.0 - 45.0 mmHg   pO2, Arterial 460.0 (H) 80.0 - 100.0 mmHg   Bicarbonate 20.1 20.0 - 24.0 mEq/L   TCO2 19.8 0 - 100 mmol/L   Acid-base deficit 5.7 (H) 0.0 - 2.0 mmol/L   O2 Saturation 99.7 %   Patient temperature 98.6    Collection site LEFT RADIAL    Drawn by [id number]    Sample type ARTERIAL DRAW   CBC     Status: Abnormal   Collection Time: 10/09/14  2:55 PM  Result Value Ref Range   WBC 25.4 (H) 4.0 - 10.5 K/uL    Comment: ADJUSTED FOR NUCLEATED RBC'S   RBC 1.96 (L) 4.22 - 5.81 MIL/uL   Hemoglobin 5.9 (LL) 13.0 - 17.0 g/dL    Comment: REPEATED TO VERIFY CRITICAL RESULT CALLED TO, READ BACK BY AND VERIFIED WITH: DILLON, S RN AT 1529 ON 3.10.16 BY MENDOZA, B    HCT 17.6 (L) 39.0 - 52.0 %   MCV 89.8 78.0 - 100.0 fL   MCH 30.1 26.0 - 34.0 pg   MCHC 33.5 30.0 - 36.0 g/dL   RDW 15.5 11.5 - 15.5 %   Platelets 106 (L) 150 - 400 K/uL  Comment: SPECIMEN CHECKED FOR CLOTS REPEATED TO VERIFY PLATELET COUNT CONFIRMED BY SMEAR   Basic metabolic panel     Status: Abnormal   Collection Time: 10/09/14  2:55 PM  Result Value Ref Range   Sodium 141 135 - 145 mmol/L    Comment: REPEATED TO VERIFY DELTA CHECK NOTED    Potassium 4.2 3.5 - 5.1 mmol/L    Comment: REPEATED TO VERIFY DELTA CHECK NOTED    Chloride 120 (H) 96 - 112 mmol/L    Comment: REPEATED TO VERIFY DELTA CHECK NOTED    CO2 21 19 - 32 mmol/L    Comment: REPEATED TO VERIFY DELTA CHECK NOTED    Glucose, Bld 170 (H) 70 - 99 mg/dL   BUN 32 (H) 6 - 23 mg/dL   Creatinine, Ser 1.18 0.50 - 1.35 mg/dL   Calcium 6.7 (L) 8.4 - 10.5 mg/dL    Comment: REPEATED TO VERIFY DELTA CHECK NOTED    GFR calc non Af Amer 68 (L) >90 mL/min   GFR calc Af Amer 79 (L) >90 mL/min    Comment: (NOTE) The eGFR has been calculated using the CKD EPI equation. This calculation has not been validated in all clinical situations. eGFR's persistently <90 mL/min  signify possible Chronic Kidney Disease.    Anion gap 0 (L) 5 - 15    Comment: REPEATED TO VERIFY DELTA CHECK NOTED   CBC     Status: Abnormal   Collection Time: 10/10/14 12:01 AM  Result Value Ref Range   WBC 16.7 (H) 4.0 - 10.5 K/uL   RBC 2.30 (L) 4.22 - 5.81 MIL/uL   Hemoglobin 6.8 (LL) 13.0 - 17.0 g/dL    Comment: CRITICAL RESULT CALLED TO, READ BACK BY AND VERIFIED WITH: SPOKE WITH CROFT,S RN [phone] COVINGTON,N    HCT 20.1 (L) 39.0 - 52.0 %   MCV 87.4 78.0 - 100.0 fL   MCH 29.6 26.0 - 34.0 pg   MCHC 33.8 30.0 - 36.0 g/dL   RDW 15.3 11.5 - 15.5 %   Platelets 71 (L) 150 - 400 K/uL    Comment: DELTA CHECK NOTED REPEATED TO VERIFY SPECIMEN CHECKED FOR CLOTS   Basic metabolic panel     Status: Abnormal   Collection Time: 10/10/14  5:30 AM  Result Value Ref Range   Sodium 142 135 - 145 mmol/L    Comment: REPEATED TO VERIFY   Potassium 3.8 3.5 - 5.1 mmol/L    Comment: REPEATED TO VERIFY   Chloride 119 (H) 96 - 112 mmol/L    Comment: REPEATED TO VERIFY   CO2 21 19 - 32 mmol/L    Comment: REPEATED TO VERIFY   Glucose, Bld 100 (H) 70 - 99 mg/dL   BUN 29 (H) 6 - 23 mg/dL   Creatinine, Ser 1.26 0.50 - 1.35 mg/dL   Calcium 7.1 (L) 8.4 - 10.5 mg/dL    Comment: REPEATED TO VERIFY   GFR calc non Af Amer 63 (L) >90 mL/min   GFR calc Af Amer 73 (L) >90 mL/min    Comment: (NOTE) The eGFR has been calculated using the CKD EPI equation. This calculation has not been validated in all clinical situations. eGFR's persistently <90 mL/min signify possible Chronic Kidney Disease.    Anion gap 2 (L) 5 - 15    Comment: REPEATED TO VERIFY  CBC     Status: Abnormal   Collection Time: 10/10/14  6:45 AM  Result Value Ref Range   WBC 14.2 (H) 4.0 -  10.5 K/uL   RBC 1.95 (L) 4.22 - 5.81 MIL/uL   Hemoglobin 5.8 (LL) 13.0 - 17.0 g/dL    Comment: CRITICAL VALUE NOTED.  VALUE IS CONSISTENT WITH PREVIOUSLY REPORTED AND CALLED VALUE.   HCT 17.1 (L) 39.0 - 52.0 %   MCV 87.7 78.0 - 100.0 fL    MCH 29.7 26.0 - 34.0 pg   MCHC 33.9 30.0 - 36.0 g/dL   RDW 16.1 (H) 11.5 - 15.5 %   Platelets 73 (L) 150 - 400 K/uL    Comment: CONSISTENT WITH PREVIOUS RESULT  Prepare RBC     Status: None   Collection Time: 10/10/14  8:22 AM  Result Value Ref Range   Order Confirmation ORDER PROCESSED BY BLOOD BANK   Prepare RBC     Status: None   Collection Time: 10/10/14 12:41 PM  Result Value Ref Range   Order Confirmation ORDER PROCESSED BY BLOOD BANK   Prepare fresh frozen plasma     Status: None (Preliminary result)   Collection Time: 10/10/14 12:42 PM  Result Value Ref Range   Unit Number [id number]    Blood Component Type THAWED PLASMA    Unit division 00    Status of Unit ISSUED    Transfusion Status OK TO TRANSFUSE    Unit Number [id number]    Blood Component Type THAWED PLASMA    Unit division 00    Status of Unit ISSUED    Transfusion Status OK TO TRANSFUSE    Unit Number [id number]    Blood Component Type THAWED PLASMA    Unit division 00    Status of Unit ISSUED    Transfusion Status OK TO TRANSFUSE    Unit Number [id number]    Blood Component Type THAWED PLASMA    Unit division 00    Status of Unit ISSUED    Transfusion Status OK TO TRANSFUSE   Type and screen     Status: None (Preliminary result)   Collection Time: 10/10/14  2:10 PM  Result Value Ref Range   ABO/RH(D) O NEG    Antibody Screen NEG    Sample Expiration 10/13/2014    Unit Number [id number]    Blood Component Type RBC LR PHER2    Unit division 00    Status of Unit ALLOCATED    Transfusion Status OK TO TRANSFUSE    Crossmatch Result COMPATIBLE    Unit Number [id number]    Blood Component Type RBC LR PHER2    Unit division 00    Status of Unit ALLOCATED    Transfusion Status OK TO TRANSFUSE    Crossmatch Result COMPATIBLE    Unit Number [id number]    Blood Component Type RED CELLS,LR    Unit division 00    Status of Unit ALLOCATED    Transfusion Status OK TO TRANSFUSE     Crossmatch Result COMPATIBLE   CBC     Status: Abnormal   Collection Time: 10/10/14  3:15 PM  Result Value Ref Range   WBC 15.9 (H) 4.0 - 10.5 K/uL    Comment: REPEATED TO VERIFY WHITE COUNT CONFIRMED ON SMEAR    RBC 3.58 (L) 4.22 - 5.81 MIL/uL   Hemoglobin 10.8 (L) 13.0 - 17.0 g/dL    Comment: REPEATED TO VERIFY DELTA CHECK NOTED POST TRANSFUSION SPECIMEN    HCT 32.0 (L) 39.0 - 52.0 %   MCV 89.4 78.0 - 100.0 fL   MCH 30.2 26.0 - 34.0 pg   MCHC 33.8 30.0 -  36.0 g/dL   RDW 14.1 11.5 - 15.5 %   Platelets 28 (LL) 150 - 400 K/uL    Comment: REPEATED TO VERIFY SPECIMEN CHECKED FOR CLOTS PLATELET COUNT CONFIRMED BY SMEAR CRITICAL RESULT CALLED TO, READ BACK BY AND VERIFIED WITH: S THOMPSON RN AT 4827 ON 03.11.16 BY G KONTOS DELTA CHECK NOTED   Protime-INR     Status: Abnormal   Collection Time: 10/10/14  3:15 PM  Result Value Ref Range   Prothrombin Time 18.7 (H) 11.6 - 15.2 seconds   INR 1.54 (H) 0.00 - 1.49  Fibrinogen     Status: Abnormal   Collection Time: 10/10/14  3:15 PM  Result Value Ref Range   Fibrinogen 140 (L) 204 - 475 mg/dL  Prepare Pheresed Platelets     Status: None (Preliminary result)   Collection Time: 10/10/14  4:05 PM  Result Value Ref Range   Unit Number [id number]    Blood Component Type PLTPHER LRI1    Unit division 00    Status of Unit ALLOCATED    Transfusion Status OK TO TRANSFUSE    Unit Number [id number]    Blood Component Type PLTPHER LR1    Unit division 00    Status of Unit ALLOCATED    Transfusion Status OK TO TRANSFUSE   Calcium, ionized     Status: None   Collection Time: 10/10/14  5:20 PM  Result Value Ref Range   Calcium, Ion 1.17 1.12 - 1.32 mmol/L    Comment: Performed at [facility] metabolic panel     Status: Abnormal   Collection Time: 10/10/14  5:20 PM  Result Value Ref Range   Sodium 142 135 - 145 mmol/L    Comment: REPEATED TO VERIFY   Potassium 4.2 3.5 - 5.1 mmol/L    Comment: REPEATED TO VERIFY    Chloride 119 (H) 96 - 112 mmol/L    Comment: REPEATED TO VERIFY   CO2 22 19 - 32 mmol/L    Comment: REPEATED TO VERIFY   Glucose, Bld 168 (H) 70 - 99 mg/dL   BUN 24 (H) 6 - 23 mg/dL   Creatinine, Ser 1.14 0.50 - 1.35 mg/dL   Calcium 7.0 (L) 8.4 - 10.5 mg/dL    Comment: REPEATED TO VERIFY   GFR calc non Af Amer 71 (L) >90 mL/min   GFR calc Af Amer 83 (L) >90 mL/min    Comment: (NOTE) The eGFR has been calculated using the CKD EPI equation. This calculation has not been validated in all clinical situations. eGFR's persistently <90 mL/min signify possible Chronic Kidney Disease.    Anion gap 1 (L) 5 - 15    Comment: REPEATED TO VERIFY  CBC     Status: Abnormal   Collection Time: 10/10/14  5:20 PM  Result Value Ref Range   WBC 14.9 (H) 4.0 - 10.5 K/uL   RBC 3.61 (L) 4.22 - 5.81 MIL/uL   Hemoglobin 10.9 (L) 13.0 - 17.0 g/dL   HCT 32.1 (L) 39.0 - 52.0 %   MCV 88.9 78.0 - 100.0 fL   MCH 30.2 26.0 - 34.0 pg   MCHC 34.0 30.0 - 36.0 g/dL   RDW 14.1 11.5 - 15.5 %   Platelets 26 (LL) 150 - 400 K/uL    Comment: REPEATED TO VERIFY SPECIMEN CHECKED FOR CLOTS CONSISTENT WITH PREVIOUS RESULT CRITICAL RESULT CALLED TO, READ BACK BY AND VERIFIED WITH: MORGAN,M. RN @1804  ON 3.11.16 BY MCCOY,N.   Magnesium  Status: None   Collection Time: 10/10/14  5:20 PM  Result Value Ref Range   Magnesium 1.5 1.5 - 2.5 mg/dL  Phosphorus     Status: None   Collection Time: 10/10/14  5:20 PM  Result Value Ref Range   Phosphorus 4.4 2.3 - 4.6 mg/dL  Draw ABG 1 hour after initiation of ventilator     Status: Abnormal (Preliminary result)   Collection Time: 10/10/14  5:30 PM  Result Value Ref Range   FIO2 0.40 %   O2 Content PENDING L/min   Delivery systems VENTILATOR    Mode PRESSURE REGULATED VOLUME CONTROL    VT 600 mL   Rate 15 resp/min   Peep/cpap 5.0 cm H20   pH, Arterial 7.313 (L) 7.350 - 7.450   pCO2 arterial 40.5 35.0 - 45.0 mmHg   pO2, Arterial 207.0 (H) 80.0 - 100.0 mmHg    Bicarbonate 19.9 (L) 20.0 - 24.0 mEq/L   TCO2 18.7 0 - 100 mmol/L   Acid-base deficit 5.4 (H) 0.0 - 2.0 mmol/L   O2 Saturation 99.2 %   Patient temperature 37.0    Collection site A-LINE    Drawn by [id number]    Sample type ARTERIAL DRAW   Glucose, capillary     Status: Abnormal   Collection Time: 10/11/14 12:10 AM  Result Value Ref Range   Glucose-Capillary 127 (H) 70 - 99 mg/dL  CBC     Status: Abnormal   Collection Time: 10/11/14 12:15 AM  Result Value Ref Range   WBC 13.4 (H) 4.0 - 10.5 K/uL   RBC 3.28 (L) 4.22 - 5.81 MIL/uL   Hemoglobin 9.9 (L) 13.0 - 17.0 g/dL   HCT 28.5 (L) 39.0 - 52.0 %   MCV 86.9 78.0 - 100.0 fL   MCH 30.2 26.0 - 34.0 pg   MCHC 34.7 30.0 - 36.0 g/dL   RDW 14.5 11.5 - 15.5 %   Platelets 32 (L) 150 - 400 K/uL    Comment: CONSISTENT WITH PREVIOUS RESULT  Comprehensive metabolic panel     Status: Abnormal   Collection Time: 10/11/14  5:10 AM  Result Value Ref Range   Sodium 144 135 - 145 mmol/L   Potassium 3.7 3.5 - 5.1 mmol/L   Chloride 118 (H) 96 - 112 mmol/L   CO2 23 19 - 32 mmol/L   Glucose, Bld 131 (H) 70 - 99 mg/dL   BUN 19 6 - 23 mg/dL   Creatinine, Ser 1.05 0.50 - 1.35 mg/dL   Calcium 7.1 (L) 8.4 - 10.5 mg/dL   Total Protein 3.4 (L) 6.0 - 8.3 g/dL   Albumin 1.7 (L) 3.5 - 5.2 g/dL   AST 33 0 - 37 U/L   ALT 26 0 - 53 U/L   Alkaline Phosphatase 31 (L) 39 - 117 U/L   Total Bilirubin 0.6 0.3 - 1.2 mg/dL   GFR calc non Af Amer 79 (L) >90 mL/min   GFR calc Af Amer >90 >90 mL/min    Comment: (NOTE) The eGFR has been calculated using the CKD EPI equation. This calculation has not been validated in all clinical situations. eGFR's persistently <90 mL/min signify possible Chronic Kidney Disease.    Anion gap 3 (L) 5 - 15  Magnesium     Status: None   Collection Time: 10/11/14  5:10 AM  Result Value Ref Range   Magnesium 2.2 1.5 - 2.5 mg/dL  Phosphorus     Status: None   Collection Time: 10/11/14  5:10 AM  Result Value Ref Range   Phosphorus 3.9  2.3 - 4.6 mg/dL  CBC     Status: Abnormal   Collection Time: 10/11/14  5:10 AM  Result Value Ref Range   WBC 12.5 (H) 4.0 - 10.5 K/uL   RBC 3.06 (L) 4.22 - 5.81 MIL/uL   Hemoglobin 9.2 (L) 13.0 - 17.0 g/dL   HCT 26.6 (L) 39.0 - 52.0 %   MCV 86.9 78.0 - 100.0 fL   MCH 30.1 26.0 - 34.0 pg   MCHC 34.6 30.0 - 36.0 g/dL   RDW 14.8 11.5 - 15.5 %   Platelets 39 (L) 150 - 400 K/uL    Comment: CONSISTENT WITH PREVIOUS RESULT  Differential     Status: Abnormal   Collection Time: 10/11/14  5:10 AM  Result Value Ref Range   Neutrophils Relative % 86 (H) 43 - 77 %   Neutro Abs 10.8 (H) 1.7 - 7.7 K/uL   Lymphocytes Relative 6 (L) 12 - 46 %   Lymphs Abs 0.8 0.7 - 4.0 K/uL   Monocytes Relative 7 3 - 12 %   Monocytes Absolute 0.9 0.1 - 1.0 K/uL   Eosinophils Relative 0 0 - 5 %   Eosinophils Absolute 0.0 0.0 - 0.7 K/uL   Basophils Relative 0 0 - 1 %   Basophils Absolute 0.0 0.0 - 0.1 K/uL  Glucose, capillary     Status: Abnormal   Collection Time: 10/11/14  7:35 AM  Result Value Ref Range   Glucose-Capillary 122 (H) 70 - 99 mg/dL   Comment 1 Notify RN    Comment 2 Document in Chart     Radiology/Results: Portable Chest Xray  10/10/2014   CLINICAL DATA:  Respiratory failure  EXAM: PORTABLE CHEST - 1 VIEW  COMPARISON:  Portable exam 1636 hours compared to 10/10/2014 at 0426 hours  FINDINGS: Tip of endotracheal tube projects 4.4 cm above carinal.  RIGHT jugular central venous catheter tip projects over SVC.  Tip of nasogastric tube projects over stomach.  Stable heart size and mediastinal contours.  Pleuroparenchymal scarring at LEFT apex unchanged.  Diffuse interstitial infiltrates increased since previous exam which could represent infection or edema.  No gross pleural effusion or pneumothorax.  IMPRESSION: Increased pulmonary infiltrates bilaterally question edema versus infection.  LEFT apical pleuroparenchymal scarring.   Electronically Signed   By: Lavonia Dana M.D.   On: 10/10/2014 16:57    Portable Chest Xray  10/10/2014   CLINICAL DATA:  Respiratory failure.  EXAM: PORTABLE CHEST - 1 VIEW  COMPARISON:  10/09/2014.  08/09/2012 .  FINDINGS: Tracheostomy tube, right IJ line in stable position. Mediastinal structures are stable. Stable left apical pleural-parenchymal thickening consistent with scarring. Mild left base subsegmental atelectasis. No acute infiltrate. Heart size normal. No pneumothorax. No acute osseus abnormality.  IMPRESSION: 1. Line and tube positions are stable. 2. Left apical pleural-parenchymal thickening consistent with scarring. Minimal left base subsegmental atelectasis.   Electronically Signed   By: Marcello Moores  Register   On: 10/10/2014 07:28   Dg Chest Port 1 View  10/09/2014   CLINICAL DATA:  Evaluate central line and endotracheal tube placement.  EXAM: PORTABLE CHEST - 1 VIEW  COMPARISON:  10/05/2014  FINDINGS: Endotracheal tube terminates 5.1 cm above carina. Right internal jugular line terminates at the low SVC.  Midline trachea. Normal heart size. No pleural effusion or pneumothorax. Pleural-parenchymal opacity in the left apex is similar back to 08/09/2012, favored to represent scarring. Mild interstitial thickening. No new consolidation.  IMPRESSION:  Appropriate position of endotracheal and right internal jugular lines.  Chronic left apical pleural-parenchymal opacity, most consistent with scarring.  Peribronchial thickening which may relate to chronic bronchitis or smoking.   Electronically Signed   By: Abigail Miyamoto M.D.   On: 10/09/2014 14:24    Anti-infectives: Anti-infectives    Start     Dose/Rate Route Frequency Ordered Stop   10/10/14 2200  cefOXitin (MEFOXIN) 2 g in dextrose 5 % 50 mL IVPB     2 g 100 mL/hr over 30 Minutes Intravenous 3 times per day 10/10/14 1601 10/11/14 2159      Assessment/Plan: Problem List: Patient Active Problem List   Diagnosis Date Noted  . Polysubstance abuse 10/09/2014  . Acute blood loss anemia   . UGIB (upper  gastrointestinal bleed) 10/08/2014  . Acute post-hemorrhagic anemia 10/08/2014  . Duodenal ulcer hemorrhage   . Acute upper GI bleed   . GI bleed 10/05/2014  . Leukocytosis 04/30/2013  . Microcytic anemia 04/30/2013  . Anemia 04/25/2013  . Anemia due to GI blood loss 08/10/2012  . Acute respiratory failure 08/10/2012  . Hypertension 08/10/2012  . Status post laparotomy with oversew of bleeding duodenal artery 08/10/2012  . Duodenal ulcer 08/10/2012  . Encephalopathy acute 08/10/2012    Platelet count is 39K.  Hg has drifted down a bit since midnight.  Will give platelet transfusion.  TNA to begin tonight.   1 Day Post-Op    LOS: 3 days   Matt B. Hassell Done, MD, [facility] [phone] beeper [phone]  10/11/2014 8:23 AM

## 2014-10-11 NOTE — Progress Notes (Signed)
eLink Physician-Brief Progress Note Patient Name: Ryan GradRandy Frazier DOB: 06/18/1961 MRN: [id number]   Date of Service  10/11/2014  HPI/Events of Note   Intake/Output Summary (Last 24 hours) at 10/11/14 2055 Last data filed at 10/11/14 2000  Gross per 24 hour  Intake   2859 ml  Output   2375 ml  Net    484 ml   Lab Results  Component Value Date   CREATININE 1.05 10/11/2014   CREATININE 1.14 10/10/2014   CREATININE 1.26 10/10/2014   Nursing concernaed about anasarac  eICU Interventions  Lasix 40 mg IV      Intervention Category Major Interventions: Acute renal failure - evaluation and management  Sandrea HughsMichael Aishani Kalis 10/11/2014, 8:54 PM

## 2014-10-11 NOTE — Progress Notes (Signed)
eLink Physician-Brief Progress Note Patient Name: [name] DOB: 09/20/1960 MRN: [id number]   Date of Service  10/11/2014  HPI/Events of Note  Restraints requested for pt safety  eICU Interventions  Ordered      Intervention Category Minor Interventions: Routine modifications to care plan (e.g. PRN medications for pain, fever)  [name] 10/11/2014, 10:26 PM

## 2014-10-12 ENCOUNTER — Inpatient Hospital Stay (HOSPITAL_COMMUNITY): Payer: Medicaid Other

## 2014-10-12 DIAGNOSIS — J95821 Acute postprocedural respiratory failure: Secondary | ICD-10-CM

## 2014-10-12 LAB — TYPE AND SCREEN
ABO/RH(D): O NEG
ANTIBODY SCREEN: NEGATIVE
UNIT DIVISION: 0
UNIT DIVISION: 0
UNIT DIVISION: 0
UNIT DIVISION: 0
UNIT DIVISION: 0
UNIT DIVISION: 0
UNIT DIVISION: 0
UNIT DIVISION: 0
UNIT DIVISION: 0
UNIT DIVISION: 0
Unit division: 0
Unit division: 0
Unit division: 0
Unit division: 0
Unit division: 0
Unit division: 0
Unit division: 0
Unit division: 0
Unit division: 0
Unit division: 0

## 2014-10-12 LAB — BASIC METABOLIC PANEL
Anion gap: 3 — ABNORMAL LOW (ref 5–15)
BUN: 15 mg/dL (ref 6–23)
CALCIUM: 7.4 mg/dL — AB (ref 8.4–10.5)
CO2: 28 mmol/L (ref 19–32)
Chloride: 116 mmol/L — ABNORMAL HIGH (ref 96–112)
Creatinine, Ser: 0.97 mg/dL (ref 0.50–1.35)
GFR calc Af Amer: >90 mL/min (ref >90)
GFR calc non Af Amer: >90 mL/min (ref >90)
Glucose, Bld: 107 mg/dL — ABNORMAL HIGH (ref 70–99)
Potassium: 3.5 mmol/L (ref 3.5–5.1)
Sodium: 147 mmol/L — ABNORMAL HIGH (ref 135–145)

## 2014-10-12 LAB — PREPARE PLATELET PHERESIS
UNIT DIVISION: 0
Unit division: 0

## 2014-10-12 LAB — CBC
HCT: 23.5 % — ABNORMAL LOW (ref 39.0–52.0)
HEMATOCRIT: 24.2 % — AB (ref 39.0–52.0)
HEMATOCRIT: 25.2 % — AB (ref 39.0–52.0)
Hemoglobin: 8.6 g/dL — ABNORMAL LOW (ref 13.0–17.0)
Hemoglobin: 7.9 g/dL — ABNORMAL LOW (ref 13.0–17.0)
Hemoglobin: 8.1 g/dL — ABNORMAL LOW (ref 13.0–17.0)
MCH: 30.1 pg (ref 26.0–34.0)
MCH: 29.6 pg (ref 26.0–34.0)
MCH: 29.7 pg (ref 26.0–34.0)
MCHC: 34.1 g/dL (ref 30.0–36.0)
MCHC: 33.6 g/dL (ref 30.0–36.0)
MCHC: 33.5 g/dL (ref 30.0–36.0)
MCV: 88.1 fL (ref 78.0–100.0)
MCV: 88 fL (ref 78.0–100.0)
MCV: 88.6 fL (ref 78.0–100.0)
PLATELETS: 90 10*3/uL — AB (ref 150–400)
Platelets: 103 10*3/uL — ABNORMAL LOW (ref 150–400)
Platelets: 107 10*3/uL — ABNORMAL LOW (ref 150–400)
RBC: 2.86 MIL/uL — ABNORMAL LOW (ref 4.22–5.81)
RBC: 2.67 MIL/uL — ABNORMAL LOW (ref 4.22–5.81)
RBC: 2.73 MIL/uL — ABNORMAL LOW (ref 4.22–5.81)
RDW: 15 % (ref 11.5–15.5)
RDW: 14.4 % (ref 11.5–15.5)
RDW: 14.8 % (ref 11.5–15.5)
WBC: 11.2 10*3/uL — ABNORMAL HIGH (ref 4.0–10.5)
WBC: 10.5 10*3/uL (ref 4.0–10.5)
WBC: 11 10*3/uL — AB (ref 4.0–10.5)

## 2014-10-12 LAB — GLUCOSE, CAPILLARY
GLUCOSE-CAPILLARY: 109 mg/dL — AB (ref 70–99)
Glucose-Capillary: 103 mg/dL — ABNORMAL HIGH (ref 70–99)
Glucose-Capillary: 103 mg/dL — ABNORMAL HIGH (ref 70–99)

## 2014-10-12 LAB — CALCIUM, IONIZED: CALCIUM ION: 1.2 mmol/L (ref 1.12–1.32)

## 2014-10-12 MED ORDER — LORAZEPAM 2 MG/ML IJ SOLN
1.0000 mg | INTRAMUSCULAR | Status: DC | PRN
  Administered 2014-10-12 – 2014-10-14 (×7): 2 mg via INTRAVENOUS
  Filled 2014-10-12 (×7): qty 1

## 2014-10-12 MED ORDER — TRACE MINERALS CR-CU-F-FE-I-MN-MO-SE-ZN IV SOLN
INTRAVENOUS | Status: AC
  Administered 2014-10-12: 19:00:00 via INTRAVENOUS
  Filled 2014-10-12: qty 1440

## 2014-10-12 NOTE — Procedures (Signed)
Extubation Procedure Note  Patient Details:   Name: [name] DOB: 08/20/1960 MRN: [id number]   Airway Documentation:  Airway 7.5 mm (Active)  Secured at (cm) 23 cm 10/12/2014  7:24 AM  Measured From Lips 10/12/2014  7:24 AM  Secured Location Right 10/12/2014  7:24 AM  Secured By [employer] Tube Holder 10/12/2014  7:24 AM  Tube Holder Repositioned Yes 10/12/2014  7:24 AM  Cuff Pressure (cm H2O) 25 cm H2O 10/12/2014  7:24 AM    Evaluation  O2 sats:  Complications: none Patient tolerated well Bilateral Breath Sounds: diminished  Suctioning: Airway able to speak  Per CCM order pt extubated, placed on nasal cannula.   Revonda HumphreyDePue, Tracy Kinner S 10/12/2014, 10:48 AM

## 2014-10-12 NOTE — Progress Notes (Signed)
PARENTERAL NUTRITION CONSULT NOTE - FOLLOW UP  Pharmacy Consult for TPN Indication: Massive GIB and needs bowel rest  Allergies  Allergen Reactions  . Chocolate Nausea And Vomiting  . Peanut-Containing Drug Products Nausea And Vomiting  . Spinach Nausea And Vomiting    Patient Measurements: Height:  (175.3 cm) Weight: 188 lb 0.8 oz (85.3 kg) IBW/kg (Calculated) : 70.7 Adjusted Body Weight: 76 kg Usual Weight:   Vital Signs: Temp: 98.3 F (36.8 C) (03/13 0800) Temp Source: Oral (03/13 0800) Pulse Rate: 64 (03/13 0600) Intake/Output from previous day: 03/12 0701 - 03/13 0700 In: 2601 [I.V.:1252; Blood:279; IV Piggyback:300; TPN:440] Out: 4215 [Urine:3410; Emesis/NG output:200; Drains:605] Intake/Output from this shift:    Labs:  Recent Labs  10/10/14 1515  10/11/14 1210 10/11/14 1830 10/12/14 0015  WBC 15.9*  < > 12.5* 12.6* 11.2*  HGB 10.8*  < > 8.6* 8.4* 8.6*  HCT 32.0*  < > 25.0* 24.7* 25.2*  PLT 28*  < > 45* 82* 90*  INR 1.54*  --   --   --   --   < > = values in this interval not displayed.   Recent Labs  10/10/14 1720 10/11/14 0500 10/11/14 0510 10/12/14 0545  NA 142  --  144 147*  K 4.2  --  3.7 3.5  CL 119*  --  118* 116*  CO2 22  --  23 28  GLUCOSE 168*  --  131* 107*  BUN 24*  --  19 15  CREATININE 1.14  --  1.05 0.97  CALCIUM 7.0*  --  7.1* 7.4*  MG 1.5  --  2.2  --   PHOS 4.4  --  3.9  --   PROT  --   --  3.4*  --   ALBUMIN  --   --  1.7*  --   AST  --   --  33  --   ALT  --   --  26  --   ALKPHOS  --   --  31*  --   BILITOT  --   --  0.6  --   PREALBUMIN  --   --  10.9*  --   TRIG  --  67  --   --    Estimated Creatinine Clearance: 94.2 mL/min (by C-G formula based on Cr of 0.97).    Recent Labs  10/11/14 1307 10/11/14 1853 10/12/14 0556  GLUCAP 97 66* 103*    Medications:  Scheduled:  . sodium chloride   Intravenous Once  . sodium chloride   Intravenous Once  . sodium chloride   Intravenous Once  .  ampicillin-sulbactam (UNASYN) IV  3 g Intravenous Q6H  . antiseptic oral rinse  7 mL Mouth Rinse QID  . chlorhexidine  15 mL Mouth Rinse BID  . insulin aspart  0-9 Units Subcutaneous 4 times per day   Infusions:  . dextrose 5 % and 0.45% NaCl 10 mL/hr at 10/11/14 2200  . pantoprozole (PROTONIX) infusion 8 mg/hr (10/11/14 2200)  . propofol 40 mcg/kg/min (10/11/14 2344)  . Marland KitchenTPN (CLINIMIX-E) Adult 40 mL/hr at 10/11/14 2200   Insulin Requirements:  1 units Novolog sensitive scale  Current Nutrition: NPO  IVF: D545 at [facility] access: triple lumen CVC  TPN start date: 3/12  ASSESSMENT  HPI: [age] YOM presents with melena and near-syncope.  Has h/o EtOH and cocaine abuse w/ previous UGIB d/t ulcers.  EGD 3/7 revealed large non-bleeding duodenal bulb ulcer.  3/9 IR angiogram did not identify bleeding vessel.  Patient intubated on 3/10 and EGD performed w/o evidence of active bleed and hemoclip placed on visible vessel.  He was extubated 3/11 and developed abdominal pain and vomited BRB x 3.  Taken to OR for ex lap and oversewing of ulcer.  J-P drain was placed.  Orders to start TPN d/t massive GIB and need for bowel rest.   Significant events:  3/11: ex lap for GI Bleed with oversewing of ulcer 3/12: remains sedated on vent, propofol currently at 30 mcg/kg/min = 15kcal/hr = 360kcal/day if this rate continues 3/13: remains on propofol at 40 mcg/kg/min = 20.5kcal/hr = 493kcal/day if this rate continues  Today: 10/12/2014   Glucose - at goal < 150  Electrolytes - Na/Cl elevated (receiving intermittent lasix), calcium corrects to WNL     Renal - Scr improved to WNL, UOP 1.7 ml/kg/hr, I/O -175317ml/24hr  LFTs - WNL on 3/12 (note h/o EtOHism)   TGs - 67 (3/12) - started on propofol postop  Prealbumin - 10.9 (3/12)  NUTRITIONAL GOALS                                                                                              RD recs: Pending Estimated needs: 98-105gm protein and 1925kcals Clinimix 5/15 at a goal rate of 83.ml/hr + 20% fat emulsion at 2510ml/hr to provide: 100g/day protein, 1894Kcal/day.  PLAN                                                                                                                          At 18:00 tonight:  Increase Clinimix E 5/15 to 60 ml/hr  NO LIPIDS DUE TO CURRENT PROPOFOL RATE  Plan to advance to goal rate as tolerated  TPN to contain standard MVI and trace elements daily  F/u RD consult recommendations  Continue CBC q4h sensitive sliding scale  IVF per MD  Monitor propofol rate to determine need for additional lipids   TPN labs every Monday and Thursday  F/u daily  Loralee PacasErin Mychaela Lennartz, PharmD, BCPS Pager: [phone] 10/12/2014, 9:18 AM

## 2014-10-12 NOTE — Progress Notes (Signed)
Patient ID: [name], male   DOB: January 10, 1961, 54 y.o.   MRN: [id number] [name] Surgery Progress Note:   2 Days Post-Op  Subjective: Mental status:  Opens eyes and responds facially on ventilator Objective: Vital signs in last 24 hours: Temp:  [97.7 F (36.5 C)-99.2 F (37.3 C)] 98.3 F (36.8 C) (03/13 0800) Pulse Rate:  [64-88] 64 (03/13 0600) Resp:  [0-27] 15 (03/13 0600) BP: (120)/(48) 120/48 mmHg (03/12 2030) SpO2:  [94 %-100 %] 100 % (03/13 0600) FiO2 (%):  [30 %] 30 % (03/13 0724) Weight:  [188 lb 0.8 oz (85.3 kg)] 188 lb 0.8 oz (85.3 kg) (03/13 0441)  Intake/Output from previous day: 03/12 0701 - 03/13 0700 In: 2601 [I.V.:1252; Blood:279; IV Piggyback:300; TPN:440] Out: 7829 [Urine:3410; Emesis/NG output:200; FAOZHY:865] Intake/Output this shift:    Physical Exam: Work of breathing is on vent;  Incision covered.    Lab Results:  Results for orders placed or performed during the hospital encounter of 10/08/14 (from the past 48 hour(s))  Prepare RBC     Status: None   Collection Time: 10/10/14 12:41 PM  Result Value Ref Range   Order Confirmation ORDER PROCESSED BY BLOOD BANK   Prepare fresh frozen plasma     Status: None   Collection Time: 10/10/14 12:42 PM  Result Value Ref Range   Unit Number [id number]    Blood Component Type THAWED PLASMA    Unit division 00    Status of Unit ISSUED,FINAL    Transfusion Status OK TO TRANSFUSE    Unit Number [id number]    Blood Component Type THAWED PLASMA    Unit division 00    Status of Unit ISSUED,FINAL    Transfusion Status OK TO TRANSFUSE    Unit Number [id number]    Blood Component Type THAWED PLASMA    Unit division 00    Status of Unit ISSUED,FINAL    Transfusion Status OK TO TRANSFUSE    Unit Number [id number]    Blood Component Type THAWED PLASMA    Unit division 00    Status of Unit ISSUED,FINAL    Transfusion Status OK TO TRANSFUSE   Type and screen     Status: None (Preliminary result)    Collection Time: 10/10/14  2:10 PM  Result Value Ref Range   ABO/RH(D) O NEG    Antibody Screen NEG    Sample Expiration 10/13/2014    Unit Number [id number]    Blood Component Type RBC LR PHER2    Unit division 00    Status of Unit ALLOCATED    Transfusion Status OK TO TRANSFUSE    Crossmatch Result COMPATIBLE    Unit Number [id number]    Blood Component Type RBC LR PHER2    Unit division 00    Status of Unit ALLOCATED    Transfusion Status OK TO TRANSFUSE    Crossmatch Result COMPATIBLE    Unit Number [id number]    Blood Component Type RED CELLS,LR    Unit division 00    Status of Unit ALLOCATED    Transfusion Status OK TO TRANSFUSE    Crossmatch Result COMPATIBLE   CBC     Status: Abnormal   Collection Time: 10/10/14  3:15 PM  Result Value Ref Range   WBC 15.9 (H) 4.0 - 10.5 K/uL    Comment: REPEATED TO VERIFY WHITE COUNT CONFIRMED ON SMEAR    RBC 3.58 (L) 4.22 - 5.81 MIL/uL   Hemoglobin 10.8 (L) 13.0 - 17.0 g/dL  Comment: REPEATED TO VERIFY DELTA CHECK NOTED POST TRANSFUSION SPECIMEN    HCT 32.0 (L) 39.0 - 52.0 %   MCV 89.4 78.0 - 100.0 fL   MCH 30.2 26.0 - 34.0 pg   MCHC 33.8 30.0 - 36.0 g/dL   RDW 14.1 11.5 - 15.5 %   Platelets 28 (LL) 150 - 400 K/uL    Comment: REPEATED TO VERIFY SPECIMEN CHECKED FOR CLOTS PLATELET COUNT CONFIRMED BY SMEAR CRITICAL RESULT CALLED TO, READ BACK BY AND VERIFIED WITH: S THOMPSON RN AT 1547 ON 03.11.16 BY G KONTOS DELTA CHECK NOTED   Protime-INR     Status: Abnormal   Collection Time: 10/10/14  3:15 PM  Result Value Ref Range   Prothrombin Time 18.7 (H) 11.6 - 15.2 seconds   INR 1.54 (H) 0.00 - 1.49  Fibrinogen     Status: Abnormal   Collection Time: 10/10/14  3:15 PM  Result Value Ref Range   Fibrinogen 140 (L) 204 - 475 mg/dL  Prepare Pheresed Platelets     Status: None (Preliminary result)   Collection Time: 10/10/14  4:05 PM  Result Value Ref Range   Unit Number [id number]    Blood Component Type  PLTPHER LRI1    Unit division 00    Status of Unit ISSUED    Transfusion Status OK TO TRANSFUSE    Unit Number [id number]    Blood Component Type PLTPHER LR1    Unit division 00    Status of Unit ALLOCATED    Transfusion Status OK TO TRANSFUSE   Calcium, ionized     Status: None   Collection Time: 10/10/14  5:20 PM  Result Value Ref Range   Calcium, Ion 1.17 1.12 - 1.32 mmol/L    Comment: Performed at [facility] metabolic panel     Status: Abnormal   Collection Time: 10/10/14  5:20 PM  Result Value Ref Range   Sodium 142 135 - 145 mmol/L    Comment: REPEATED TO VERIFY   Potassium 4.2 3.5 - 5.1 mmol/L    Comment: REPEATED TO VERIFY   Chloride 119 (H) 96 - 112 mmol/L    Comment: REPEATED TO VERIFY   CO2 22 19 - 32 mmol/L    Comment: REPEATED TO VERIFY   Glucose, Bld 168 (H) 70 - 99 mg/dL   BUN 24 (H) 6 - 23 mg/dL   Creatinine, Ser 1.14 0.50 - 1.35 mg/dL   Calcium 7.0 (L) 8.4 - 10.5 mg/dL    Comment: REPEATED TO VERIFY   GFR calc non Af Amer 71 (L) >90 mL/min   GFR calc Af Amer 83 (L) >90 mL/min    Comment: (NOTE) The eGFR has been calculated using the CKD EPI equation. This calculation has not been validated in all clinical situations. eGFR's persistently <90 mL/min signify possible Chronic Kidney Disease.    Anion gap 1 (L) 5 - 15    Comment: REPEATED TO VERIFY  CBC     Status: Abnormal   Collection Time: 10/10/14  5:20 PM  Result Value Ref Range   WBC 14.9 (H) 4.0 - 10.5 K/uL   RBC 3.61 (L) 4.22 - 5.81 MIL/uL   Hemoglobin 10.9 (L) 13.0 - 17.0 g/dL   HCT 32.1 (L) 39.0 - 52.0 %   MCV 88.9 78.0 - 100.0 fL   MCH 30.2 26.0 - 34.0 pg   MCHC 34.0 30.0 - 36.0 g/dL   RDW 14.1 11.5 - 15.5 %   Platelets 26 (LL)  150 - 400 K/uL    Comment: REPEATED TO VERIFY SPECIMEN CHECKED FOR CLOTS CONSISTENT WITH PREVIOUS RESULT CRITICAL RESULT CALLED TO, READ BACK BY AND VERIFIED WITH: MORGAN,M. RN _0  ON 3.11.16 BY MCCOY,N.   Magnesium     Status: None    Collection Time: 10/10/14  5:20 PM  Result Value Ref Range   Magnesium 1.5 1.5 - 2.5 mg/dL  Phosphorus     Status: None   Collection Time: 10/10/14  5:20 PM  Result Value Ref Range   Phosphorus 4.4 2.3 - 4.6 mg/dL  Draw ABG 1 hour after initiation of ventilator     Status: Abnormal (Preliminary result)   Collection Time: 10/10/14  5:30 PM  Result Value Ref Range   FIO2 0.40 %   O2 Content PENDING L/min   Delivery systems VENTILATOR    Mode PRESSURE REGULATED VOLUME CONTROL    VT 600 mL   Rate 15 resp/min   Peep/cpap 5.0 cm H20   pH, Arterial 7.313 (L) 7.350 - 7.450   pCO2 arterial 40.5 35.0 - 45.0 mmHg   pO2, Arterial 207.0 (H) 80.0 - 100.0 mmHg   Bicarbonate 19.9 (L) 20.0 - 24.0 mEq/L   TCO2 18.7 0 - 100 mmol/L   Acid-base deficit 5.4 (H) 0.0 - 2.0 mmol/L   O2 Saturation 99.2 %   Patient temperature 37.0    Collection site A-LINE    Drawn by [id number]    Sample type ARTERIAL DRAW   Glucose, capillary     Status: Abnormal   Collection Time: 10/11/14 12:10 AM  Result Value Ref Range   Glucose-Capillary 127 (H) 70 - 99 mg/dL  CBC     Status: Abnormal   Collection Time: 10/11/14 12:15 AM  Result Value Ref Range   WBC 13.4 (H) 4.0 - 10.5 K/uL   RBC 3.28 (L) 4.22 - 5.81 MIL/uL   Hemoglobin 9.9 (L) 13.0 - 17.0 g/dL   HCT 28.5 (L) 39.0 - 52.0 %   MCV 86.9 78.0 - 100.0 fL   MCH 30.2 26.0 - 34.0 pg   MCHC 34.7 30.0 - 36.0 g/dL   RDW 14.5 11.5 - 15.5 %   Platelets 32 (L) 150 - 400 K/uL    Comment: CONSISTENT WITH PREVIOUS RESULT  Triglycerides     Status: None   Collection Time: 10/11/14  5:00 AM  Result Value Ref Range   Triglycerides 67 <150 mg/dL    Comment: Performed at [facility]  Comprehensive metabolic panel     Status: Abnormal   Collection Time: 10/11/14  5:10 AM  Result Value Ref Range   Sodium 144 135 - 145 mmol/L   Potassium 3.7 3.5 - 5.1 mmol/L   Chloride 118 (H) 96 - 112 mmol/L   CO2 23 19 - 32 mmol/L   Glucose, Bld 131 (H) 70 - 99 mg/dL   BUN 19 6  - 23 mg/dL   Creatinine, Ser 1.05 0.50 - 1.35 mg/dL   Calcium 7.1 (L) 8.4 - 10.5 mg/dL   Total Protein 3.4 (L) 6.0 - 8.3 g/dL   Albumin 1.7 (L) 3.5 - 5.2 g/dL   AST 33 0 - 37 U/L   ALT 26 0 - 53 U/L   Alkaline Phosphatase 31 (L) 39 - 117 U/L   Total Bilirubin 0.6 0.3 - 1.2 mg/dL   GFR calc non Af Amer 79 (L) >90 mL/min   GFR calc Af Amer >90 >90 mL/min    Comment: (NOTE) The eGFR has been  calculated using the CKD EPI equation. This calculation has not been validated in all clinical situations. eGFR's persistently <90 mL/min signify possible Chronic Kidney Disease.    Anion gap 3 (L) 5 - 15  Prealbumin     Status: Abnormal   Collection Time: 10/11/14  5:10 AM  Result Value Ref Range   Prealbumin 10.9 (L) 17.0 - 34.0 mg/dL    Comment: Performed at [facility]  Magnesium     Status: None   Collection Time: 10/11/14  5:10 AM  Result Value Ref Range   Magnesium 2.2 1.5 - 2.5 mg/dL  Phosphorus     Status: None   Collection Time: 10/11/14  5:10 AM  Result Value Ref Range   Phosphorus 3.9 2.3 - 4.6 mg/dL  CBC     Status: Abnormal   Collection Time: 10/11/14  5:10 AM  Result Value Ref Range   WBC 12.5 (H) 4.0 - 10.5 K/uL   RBC 3.06 (L) 4.22 - 5.81 MIL/uL   Hemoglobin 9.2 (L) 13.0 - 17.0 g/dL   HCT 26.6 (L) 39.0 - 52.0 %   MCV 86.9 78.0 - 100.0 fL   MCH 30.1 26.0 - 34.0 pg   MCHC 34.6 30.0 - 36.0 g/dL   RDW 14.8 11.5 - 15.5 %   Platelets 39 (L) 150 - 400 K/uL    Comment: CONSISTENT WITH PREVIOUS RESULT  Differential     Status: Abnormal   Collection Time: 10/11/14  5:10 AM  Result Value Ref Range   Neutrophils Relative % 86 (H) 43 - 77 %   Neutro Abs 10.8 (H) 1.7 - 7.7 K/uL   Lymphocytes Relative 6 (L) 12 - 46 %   Lymphs Abs 0.8 0.7 - 4.0 K/uL   Monocytes Relative 7 3 - 12 %   Monocytes Absolute 0.9 0.1 - 1.0 K/uL   Eosinophils Relative 0 0 - 5 %   Eosinophils Absolute 0.0 0.0 - 0.7 K/uL   Basophils Relative 0 0 - 1 %   Basophils Absolute 0.0 0.0 - 0.1 K/uL   Glucose, capillary     Status: Abnormal   Collection Time: 10/11/14  7:35 AM  Result Value Ref Range   Glucose-Capillary 122 (H) 70 - 99 mg/dL   Comment 1 Notify RN    Comment 2 Document in Chart   CBC     Status: Abnormal   Collection Time: 10/11/14 12:10 PM  Result Value Ref Range   WBC 12.5 (H) 4.0 - 10.5 K/uL   RBC 2.87 (L) 4.22 - 5.81 MIL/uL   Hemoglobin 8.6 (L) 13.0 - 17.0 g/dL   HCT 25.0 (L) 39.0 - 52.0 %   MCV 87.1 78.0 - 100.0 fL   MCH 30.0 26.0 - 34.0 pg   MCHC 34.4 30.0 - 36.0 g/dL   RDW 15.0 11.5 - 15.5 %   Platelets 45 (L) 150 - 400 K/uL    Comment: REPEATED TO VERIFY CONSISTENT WITH PREVIOUS RESULT   Glucose, capillary     Status: None   Collection Time: 10/11/14  1:07 PM  Result Value Ref Range   Glucose-Capillary 97 70 - 99 mg/dL  CBC     Status: Abnormal   Collection Time: 10/11/14  6:30 PM  Result Value Ref Range   WBC 12.6 (H) 4.0 - 10.5 K/uL   RBC 2.81 (L) 4.22 - 5.81 MIL/uL   Hemoglobin 8.4 (L) 13.0 - 17.0 g/dL   HCT 24.7 (L) 39.0 - 52.0 %   MCV 87.9  78.0 - 100.0 fL   MCH 29.9 26.0 - 34.0 pg   MCHC 34.0 30.0 - 36.0 g/dL   RDW 15.1 11.5 - 15.5 %   Platelets 82 (L) 150 - 400 K/uL    Comment: RESULT REPEATED AND VERIFIED SPECIMEN CHECKED FOR CLOTS POST TRANSFUSION SPECIMEN   Glucose, capillary     Status: Abnormal   Collection Time: 10/11/14  6:53 PM  Result Value Ref Range   Glucose-Capillary 66 (L) 70 - 99 mg/dL  CBC     Status: Abnormal   Collection Time: 10/12/14 12:15 AM  Result Value Ref Range   WBC 11.2 (H) 4.0 - 10.5 K/uL   RBC 2.86 (L) 4.22 - 5.81 MIL/uL   Hemoglobin 8.6 (L) 13.0 - 17.0 g/dL   HCT 25.2 (L) 39.0 - 52.0 %   MCV 88.1 78.0 - 100.0 fL   MCH 30.1 26.0 - 34.0 pg   MCHC 34.1 30.0 - 36.0 g/dL   RDW 15.0 11.5 - 15.5 %   Platelets 90 (L) 150 - 400 K/uL    Comment: REPEATED TO VERIFY PLATELET COUNT CONFIRMED BY SMEAR SPECIMEN CHECKED FOR CLOTS   Basic metabolic panel     Status: Abnormal   Collection Time: 10/12/14  5:45  AM  Result Value Ref Range   Sodium 147 (H) 135 - 145 mmol/L    Comment: RESULT REPEATED AND VERIFIED   Potassium 3.5 3.5 - 5.1 mmol/L    Comment: RESULT REPEATED AND VERIFIED   Chloride 116 (H) 96 - 112 mmol/L    Comment: RESULT REPEATED AND VERIFIED   CO2 28 19 - 32 mmol/L    Comment: RESULT REPEATED AND VERIFIED   Glucose, Bld 107 (H) 70 - 99 mg/dL   BUN 15 6 - 23 mg/dL   Creatinine, Ser 0.97 0.50 - 1.35 mg/dL   Calcium 7.4 (L) 8.4 - 10.5 mg/dL    Comment: RESULT REPEATED AND VERIFIED   GFR calc non Af Amer >90 >90 mL/min   GFR calc Af Amer >90 >90 mL/min    Comment: (NOTE) The eGFR has been calculated using the CKD EPI equation. This calculation has not been validated in all clinical situations. eGFR's persistently <90 mL/min signify possible Chronic Kidney Disease.    Anion gap 3 (L) 5 - 15    Comment: RESULT REPEATED AND VERIFIED  Glucose, capillary     Status: Abnormal   Collection Time: 10/12/14  5:56 AM  Result Value Ref Range   Glucose-Capillary 103 (H) 70 - 99 mg/dL    Radiology/Results: Dg Chest Port 1 View  10/12/2014   CLINICAL DATA:  Fever air  EXAM: PORTABLE CHEST - 1 VIEW  COMPARISON:  Portable exam 0522 hr compared to 10/11/2014  FINDINGS: Tip of endotracheal tube projects 2.9 cm above carina.  Nasogastric tube extends into stomach.  RIGHT jugular central venous catheter tip projects over distal SVC.  Normal heart size, mediastinal contours and pulmonary vascularity.  Persistent RIGHT basilar atelectasis.  Peribronchial thickening diffuse BILATERAL interstitial infiltrates little changed.  Slightly increased atelectasis versus consolidation in retrocardiac LEFT lower lobe.  LEFT apical scarring and noted.  No gross pleural effusion or pneumothorax.  IMPRESSION: Persistent pulmonary infiltrates with persistent subsegmental atelectasis at RIGHT lung base and slightly increased atelectasis versus consolidation in LEFT lower lobe.   Electronically Signed   By: Lavonia Dana M.D.   On: 10/12/2014 07:17   Portable Chest Xray  10/11/2014   CLINICAL DATA:  Respiratory failure, smoker  EXAM: PORTABLE CHEST - 1 VIEW  COMPARISON:  Portable exam 4496 hr compared to 10/10/2014  FINDINGS: Tip of endotracheal tube projects 3.7 cm above carina.  Nasogastric tube extends into stomach.  RIGHT jugular central venous catheter tip projects over SVC near cavoatrial junction.  Normal heart size and mediastinal contours.  Persistent BILATERAL interstitial infiltrates.  Mild RIGHT basilar atelectasis and LEFT apical pleuroparenchymal scarring present.  No pneumothorax or acute osseous findings.  IMPRESSION: Persistent diffuse interstitial infiltrates with slightly increased RIGHT basilar atelectasis.   Electronically Signed   By: Lavonia Dana M.D.   On: 10/11/2014 09:09   Portable Chest Xray  10/10/2014   CLINICAL DATA:  Respiratory failure  EXAM: PORTABLE CHEST - 1 VIEW  COMPARISON:  Portable exam 1636 hours compared to 10/10/2014 at 0426 hours  FINDINGS: Tip of endotracheal tube projects 4.4 cm above carinal.  RIGHT jugular central venous catheter tip projects over SVC.  Tip of nasogastric tube projects over stomach.  Stable heart size and mediastinal contours.  Pleuroparenchymal scarring at LEFT apex unchanged.  Diffuse interstitial infiltrates increased since previous exam which could represent infection or edema.  No gross pleural effusion or pneumothorax.  IMPRESSION: Increased pulmonary infiltrates bilaterally question edema versus infection.  LEFT apical pleuroparenchymal scarring.   Electronically Signed   By: Lavonia Dana M.D.   On: 10/10/2014 16:57    Anti-infectives: Anti-infectives    Start     Dose/Rate Route Frequency Ordered Stop   10/11/14 1200  Ampicillin-Sulbactam (UNASYN) 3 g in sodium chloride 0.9 % 100 mL IVPB     3 g 100 mL/hr over 60 Minutes Intravenous Every 6 hours 10/11/14 1031     10/10/14 2200  cefOXitin (MEFOXIN) 2 g in dextrose 5 % 50 mL IVPB  Status:   Discontinued     2 g 100 mL/hr over 30 Minutes Intravenous 3 times per day 10/10/14 1601 10/11/14 1030      Assessment/Plan: Problem List: Patient Active Problem List   Diagnosis Date Noted  . Polysubstance abuse 10/09/2014  . Acute blood loss anemia   . UGIB (upper gastrointestinal bleed) 10/08/2014  . Acute post-hemorrhagic anemia 10/08/2014  . Duodenal ulcer hemorrhage   . Acute upper GI bleed   . GI bleed 10/05/2014  . Leukocytosis 04/30/2013  . Microcytic anemia 04/30/2013  . Anemia 04/25/2013  . Anemia due to GI blood loss 08/10/2012  . Acute respiratory failure 08/10/2012  . Hypertension 08/10/2012  . Status post laparotomy with oversew of bleeding duodenal artery 08/10/2012  . Duodenal ulcer 08/10/2012  . Encephalopathy acute 08/10/2012    HG is 8.6.  Platelet count is up to 90k.  Wean per CCM.   2 Days Post-Op    LOS: 4 days   Matt B. Hassell Done, MD, [facility] [phone] beeper [phone]  10/12/2014 9:08 AM

## 2014-10-12 NOTE — Progress Notes (Signed)
PULMONARY / CRITICAL CARE MEDICINE   Name: [name] MRN: [id number] DOB: 12/25/1960    ADMISSION DATE:  10/08/2014 CONSULTATION DATE:  10/08/14  REFERRING MD :  Madilyn Hookees   CHIEF COMPLAINT:  Melena, near-syncope  INITIAL PRESENTATION: [name] is a 54 year old man with a history of ETOH abuse and recurrent UGIB due to ulcers who presented to ED 3/9 with melena and near-syncope. Patient was [facility] 3/6 with same problem and left AMA 3/8 in am.   STUDIES:  3/7 endoscopy -  Large non-bleeding and deep ulcer with visible vessel in duodenal bulb. Normal mucosa of esophagus and stomach.  3/8 [facility] 3/9 [facility], IR angiogram> no vessel feeding duodenum identified  SIGNIFICANT EVENTS: 3/10 overnight passed bright red blood per rectum,  3/10: EGD. Endo-clipping of visible vessel w/in duodenal ulcer.  3/11 extubated. hgb drifted 1 gm over night.  3/11 Exploratory laparotomy,  over sewing of duodenal ulcer and external over sewing of feeder vessels   SUBJECTIVE: No obvious pain Int agitation Afebrile   VITAL SIGNS: Temp:  [97.7 F (36.5 C)-99.2 F (37.3 C)] 98.3 F (36.8 C) (03/13 0800) Pulse Rate:  [64-88] 64 (03/13 0600) Resp:  [0-27] 15 (03/13 0600) BP: (120)/(48) 120/48 mmHg (03/12 2030) SpO2:  [94 %-100 %] 100 % (03/13 0600) FiO2 (%):  [30 %] 30 % (03/13 0724) Weight:  [85.3 kg (188 lb 0.8 oz)] 85.3 kg (188 lb 0.8 oz) (03/13 0441) HEMODYNAMICS:   VENTILATOR SETTINGS:  On room air  Vent Mode:  [-] PRVC FiO2 (%):  [30 %] 30 % Set Rate:  [15 bmp] 15 bmp Vt Set:  [600 mL] 600 mL PEEP:  [5 cmH20] 5 cmH20 Pressure Support:  [10 cmH20] 10 cmH20 Plateau Pressure:  [19 cmH20-25 cmH20] 25 cmH20 INTAKE / OUTPUT:  Intake/Output Summary (Last 24 hours) at 10/12/14 0949 Last data filed at 10/12/14 0700  Gross per 24 hour  Intake   2231 ml  Output   3940 ml  Net  -1709 ml    PHYSICAL EXAMINATION: General: sedated Neuro:  RASS 0,  moves  all four ext  HEENT:  NCAT, EOMi Cardiovascular:  RRR, no clear murmur Lungs:  CTA B Abdomen: BS+, soft, tender epigastrum Musculoskeletal:  Normal bulk and tone  Skin:  No rash or skin breakdown  LABS:  CBC  Recent Labs Lab 10/11/14 1210 10/11/14 1830 10/12/14 0015  WBC 12.5* 12.6* 11.2*  HGB 8.6* 8.4* 8.6*  HCT 25.0* 24.7* 25.2*  PLT 45* 82* 90*   Coag's  Recent Labs Lab 10/06/14 0651 10/08/14 1001 10/10/14 1515  APTT  --  26  --   INR 1.17 1.14 1.54*   BMET  Recent Labs Lab 10/10/14 1720 10/11/14 0510 10/12/14 0545  NA 142 144 147*  K 4.2 3.7 3.5  CL 119* 118* 116*  CO2 22 23 28   BUN 24* 19 15  CREATININE 1.14 1.05 0.97  GLUCOSE 168* 131* 107*   Electrolytes  Recent Labs Lab 10/06/14 0500  10/10/14 1720 10/11/14 0510 10/12/14 0545  CALCIUM 6.9*  < > 7.0* 7.1* 7.4*  MG 2.0  --  1.5 2.2  --   PHOS 2.9  --  4.4 3.9  --   < > = values in this interval not displayed. Sepsis Markers  Recent Labs Lab 10/05/14 2044  LATICACIDVEN 2.91*   ABG  Recent Labs Lab 10/09/14 1430 10/10/14 1730  PHART 7.278* 7.313*  PCO2ART 44.5 40.5  PO2ART 460.0* 207.0*   Liver Enzymes  Recent Labs Lab 10/05/14 2004 10/08/14 0954 10/11/14 0510  AST 18 26 33  ALT ALKPHOS 26* 30* 31*  BILITOT 0.4 0.2* 0.6  ALBUMIN 1.9* 2.1* 1.7*   Cardiac Enzymes  Recent Labs Lab 10/05/14 2004  TROPONINI <0.03   Glucose  Recent Labs Lab 10/06/14 0025 10/11/14 0010 10/11/14 0735 10/11/14 1307 10/11/14 1853 10/12/14 0556  GLUCAP 99 127* 122* 97 66* 103*    Imaging   ASSESSMENT / PLAN:  GASTROINTESTINAL A:   Acute GI bleed due to duodenal ulcer > 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, perpetuated by polysubstance abuse (cocaine and EtOH) 3/7 endoscopy with visible vessel, high risk for intervention 3/9 IR embolization could not identify a vessel 3/10 EGD: got ENDO clip of large visible ulcer (not actively bleeding at that time) 3/11  Exploratory laparotomy, gastrorrhaphy, over sewing of duodenal ulcer and external over sewing of feeder vessels   Plan NPO  Continue Pantoprazole infusion TNA per surgery   HEMATOLOGIC A:  Anemia due to acute blood loss  >> total as of 3/11 (8 units PRBC and 2 FFP) Thrombocytopenia P:  Transfuse plts, goal >50  Trend CBC q8h VTE prophylaxis - SCDs   NEUROLOGIC/PSYCHE A:   ETOH abuse   H/o polysubstance abuse  History of Axis 1 Psychosis NOS per 2014  P:   Monitor for withdrawal Thiamine, folic acid   PULMONARY OETT 3/10>> 3/11,  3/11 >> A: Acute respiratory failure Aspiration pna -RLL P:  Defer extubation until certain no bleeding, hopefully 3/14 Smoking cessation Monitor O2 saturation  CARDIOVASCULAR A:  Tachycardia due to acute blood loss  Near syncope due to symptomatic anemia  P:  Keep even volume status  Tele  RENAL A:  Asymptomatic hypocalcemia s/p multiple transfusions  P:   Monitor BMET and UOP Replace electrolytes as needed   INFECTIOUS A:  Leukocytosis  RLL aspiration P:  Add empiric unasyn   ENDOCRINE A:  No active issues P:   Trend glucose on BMET   FAMILY  - Updates: brother Hurbert Duran  3/10    TODAY'S SUMMARY:  Severe UGIB, intubated for EGD and clipping of vessel 3/10.   he developed recurrent massive hematemesis requiring surgical intervention.  Hope to extubate soon  The patient is critically ill with multiple organ systems failure and requires high complexity decision making for assessment and support, frequent evaluation and titration of therapies, application of advanced monitoring technologies and extensive interpretation of multiple databases. Critical Care Time devoted to patient care services described in this note independent of APP time is 31 minutes.    Oretha Milch MD 10/12/2014, 9:49 AM

## 2014-10-12 NOTE — Progress Notes (Signed)
INITIAL NUTRITION ASSESSMENT  DOCUMENTATION CODES Per approved criteria  -Not Applicable   INTERVENTION: - TPN per pharmacy - RD will continue to monitor for diet advancement  NUTRITION DIAGNOSIS: Inadequate oral intake related to inability to eat as evidenced by NPO.   Goal: Pt to meet >/= 90% of their estimated nutrition needs   Monitor:  Weight trend, NPO, TPN tolerance, labs  Reason for Assessment: Consult for new TPN  54 y.o. male  Admitting Dx: Polysubstance abuse  ASSESSMENT: 54 year old man with a history of ETOH abuse and recurrent UGIB due to ulcers who presented to ED 3/9 with melena and near-syncope. Patient was admitted to [facility] 3/6 with same problem and left AMA 3/8 in am.   3/11- s/p exploratory laparotomy, over sewing of duodenal ulcer and external over sewing of feeder vessels 3/13- extubated  - Pt with no obvious fat or muscle wasting - RN asked RD not to wake pt due to agitation.  - Wt increased 38 lbs since 3/8 per chart history. Pt with +15 L fluid balance. Needs calculated based on dry weight of 150 lbs.   Labs reviewed Na elevated, CBGs 66-103  Pt with TPN currently running E 5/15 at 40 mL/hr.  Plan per pharmacy: At 18:00 tonight:  Increase Clinimix E 5/15 to 60 ml/hr  Height: Ht Readings from Last 1 Encounters:  10/08/14  (1.753 m)    Weight: Wt Readings from Last 1 Encounters:  10/12/14 188 lb 0.8 oz (85.3 kg)    Ideal Body Weight: 160 lbs  % Ideal Body Weight: 118%  Wt Readings from Last 10 Encounters:  10/12/14 188 lb 0.8 oz (85.3 kg)  10/07/14 150 lb 9.2 oz (68.3 kg)  05/27/13 160 lb (72.576 kg)  05/11/13 173 lb 15.1 oz (78.9 kg)  05/04/13 162 lb 4.1 oz (73.6 kg)  04/27/13 163 lb 9.3 oz (74.2 kg)  03/25/13 165 lb (74.844 kg)  12/23/12 163 lb (73.936 kg)  08/22/12 148 lb 9.6 oz (67.405 kg)    Usual Body Weight: 150 lbs  % Usual Body Weight: 125%  BMI:  Body mass index is 27.76 kg/(m^2).  Estimated  Nutritional Needs: Kcal: 1800-2000 Protein: 100-115 g Fluid: Per MD  Skin: closed incision on abdomen  Diet Order: Diet NPO time specified TPN (CLINIMIX-E) Adult .TPN (CLINIMIX-E) Adult  EDUCATION NEEDS: -Education not appropriate at this time   Intake/Output Summary (Last 24 hours) at 10/12/14 1255 Last data filed at 10/12/14 1200  Gross per 24 hour  Intake 2223.33 ml  Output   4025 ml  Net -1801.67 ml    Last BM: 3/11   Labs:   Recent Labs Lab 10/06/14 0500  10/10/14 1720 10/11/14 0510 10/12/14 0545  NA 140  < > 142 144 147*  K 4.2  < > 4.2 3.7 3.5  CL 114*  < > 119* 118* 116*  CO2 23  < > BUN 32*  < > 24* 19 15  CREATININE 1.12  < > 1.14 1.05 0.97  CALCIUM 6.9*  < > 7.0* 7.1* 7.4*  MG 2.0  --  1.5 2.2  --   PHOS 2.9  --  4.4 3.9  --   GLUCOSE 87  < > 168* 131* 107*  < > = values in this interval not displayed.  CBG (last 3)   Recent Labs  10/11/14 1853 10/12/14 0556 10/12/14 1206  GLUCAP 66* 103* 103*    Scheduled Meds: . sodium chloride  Intravenous Once  . sodium chloride   Intravenous Once  . sodium chloride   Intravenous Once  . ampicillin-sulbactam (UNASYN) IV  3 g Intravenous Q6H  . antiseptic oral rinse  7 mL Mouth Rinse QID  . chlorhexidine  15 mL Mouth Rinse BID  . insulin aspart  0-9 Units Subcutaneous 4 times per day    Continuous Infusions: . Marland Kitchen.TPN (CLINIMIX-E) Adult    . dextrose 5 % and 0.45% NaCl 10 mL/hr at 10/12/14 1027  . pantoprozole (PROTONIX) infusion 8 mg/hr (10/11/14 2200)  . Marland Kitchen.TPN (CLINIMIX-E) Adult 40 mL/hr at 10/11/14 2200    Past Medical History  Diagnosis Date  . Abdominal pain, other specified site   . GERD (gastroesophageal reflux disease)   . Abnormal CT scan, esophagus 03/2012  . Substance abuse 03/2012    tox screen positive for cocaine.     Past Surgical History  Procedure Laterality Date  . Undescended testicle      on right. noted on 03/2012 CT scan.  no surgery referral.   .  Esophagogastroduodenoscopy  08/10/2012    Procedure: ESOPHAGOGASTRODUODENOSCOPY (EGD);  Surgeon: Rachael Feeaniel P Jacobs, MD;  Location[facility] ENDOSCOPY;  Service: Endoscopy;  Laterality: N/A;  will be done in room 1   . Laparotomy  08/10/2012    Procedure: EXPLORATORY LAPAROTOMY;  Surgeon: Cherylynn RidgesJames O Wyatt, MD;  [facility];  Service: General;  Laterality: N/A;  . Esophagogastroduodenoscopy Left 05/01/2013    Procedure: ESOPHAGOGASTRODUODENOSCOPY (EGD);  Surgeon: Charna ElizabethJyothi Mann, MD;  Location: [facility] ENDOSCOPY;  Service: Endoscopy;  Laterality: Left;  . Esophagogastroduodenoscopy N/A 05/11/2013    Procedure: ESOPHAGOGASTRODUODENOSCOPY (EGD);  Surgeon: Graylin ShiverSalem F Ganem, MD;  [facility];  Service: Endoscopy;  Laterality: N/A;  . Esophagogastroduodenoscopy N/A 10/06/2014    Procedure: ESOPHAGOGASTRODUODENOSCOPY (EGD);  Surgeon: Beverley FiedlerJay M Pyrtle, MD;  [facility];  Service: Endoscopy;  Laterality: N/A;  . Esophagogastroduodenoscopy N/A 10/09/2014    Procedure: ESOPHAGOGASTRODUODENOSCOPY (EGD);  Surgeon: Beverley FiedlerJay M Pyrtle, MD;  [facility];  Service: Gastroenterology;  Laterality: N/A;  Bedside    Emmaline KluverHaley Claudy Abdallah MS, RD, LDN [phone]

## 2014-10-13 ENCOUNTER — Encounter (HOSPITAL_COMMUNITY): Payer: Self-pay | Admitting: Surgery

## 2014-10-13 ENCOUNTER — Inpatient Hospital Stay (HOSPITAL_COMMUNITY): Payer: Medicaid Other

## 2014-10-13 LAB — POCT I-STAT 7, (LYTES, BLD GAS, ICA,H+H)
ACID-BASE DEFICIT: 9 mmol/L — AB (ref 0.0–2.0)
ACID-BASE DEFICIT: 7 mmol/L — AB (ref 0.0–2.0)
Acid-base deficit: 10 mmol/L — ABNORMAL HIGH (ref 0.0–2.0)
Acid-base deficit: 1 mmol/L (ref 0.0–2.0)
BICARBONATE: 18.3 meq/L — AB (ref 20.0–24.0)
Bicarbonate: 17.1 mEq/L — ABNORMAL LOW (ref 20.0–24.0)
Bicarbonate: 19.1 mEq/L — ABNORMAL LOW (ref 20.0–24.0)
Bicarbonate: 24.3 mEq/L — ABNORMAL HIGH (ref 20.0–24.0)
CALCIUM ION: 1.03 mmol/L — AB (ref 1.12–1.23)
CALCIUM ION: 1.24 mmol/L — AB (ref 1.12–1.23)
Calcium, Ion: 0.68 mmol/L — ABNORMAL LOW (ref 1.12–1.23)
Calcium, Ion: 1.14 mmol/L (ref 1.12–1.23)
HCT: 19 % — ABNORMAL LOW (ref 39.0–52.0)
HCT: 18 % — ABNORMAL LOW (ref 39.0–52.0)
HCT: 26 % — ABNORMAL LOW (ref 39.0–52.0)
HEMATOCRIT: 12 % — AB (ref 39.0–52.0)
HEMOGLOBIN: 6.5 g/dL — AB (ref 13.0–17.0)
Hemoglobin: 4.1 g/dL — CL (ref 13.0–17.0)
Hemoglobin: 6.1 g/dL — CL (ref 13.0–17.0)
Hemoglobin: 8.8 g/dL — ABNORMAL LOW (ref 13.0–17.0)
O2 SAT: 100 %
O2 SAT: 100 %
O2 Saturation: 100 %
O2 Saturation: 100 %
PCO2 ART: 41 mmHg (ref 35.0–45.0)
PCO2 ART: 38.9 mmHg (ref 35.0–45.0)
PCO2 ART: 39.9 mmHg (ref 35.0–45.0)
PH ART: 7.181 — AB (ref 7.350–7.450)
PH ART: 7.227 — AB (ref 7.350–7.450)
PO2 ART: 252 mmHg — AB (ref 80.0–100.0)
POTASSIUM: 4.2 mmol/L (ref 3.5–5.1)
POTASSIUM: 4 mmol/L (ref 3.5–5.1)
POTASSIUM: 4.3 mmol/L (ref 3.5–5.1)
Patient temperature: 97
Patient temperature: 35.2
Patient temperature: 35.3
Potassium: 4.2 mmol/L (ref 3.5–5.1)
Sodium: 141 mmol/L (ref 135–145)
Sodium: 142 mmol/L (ref 135–145)
Sodium: 143 mmol/L (ref 135–145)
Sodium: 144 mmol/L (ref 135–145)
TCO2: 18 mmol/L (ref 0–100)
TCO2: 20 mmol/L (ref 0–100)
TCO2: 20 mmol/L (ref 0–100)
TCO2: 26 mmol/L (ref 0–100)
pCO2 arterial: 48.4 mmHg — ABNORMAL HIGH (ref 35.0–45.0)
pH, Arterial: 7.292 — ABNORMAL LOW (ref 7.350–7.450)
pH, Arterial: 7.386 (ref 7.350–7.450)
pO2, Arterial: 465 mmHg — ABNORMAL HIGH (ref 80.0–100.0)
pO2, Arterial: 541 mmHg — ABNORMAL HIGH (ref 80.0–100.0)
pO2, Arterial: 257 mmHg — ABNORMAL HIGH (ref 80.0–100.0)

## 2014-10-13 LAB — CBC
HEMATOCRIT: 23.6 % — AB (ref 39.0–52.0)
HEMATOCRIT: 24 % — AB (ref 39.0–52.0)
HEMATOCRIT: 24.6 % — AB (ref 39.0–52.0)
HEMATOCRIT: 24.3 % — AB (ref 39.0–52.0)
HEMOGLOBIN: 7.8 g/dL — AB (ref 13.0–17.0)
HEMOGLOBIN: 8.1 g/dL — AB (ref 13.0–17.0)
Hemoglobin: 8 g/dL — ABNORMAL LOW (ref 13.0–17.0)
Hemoglobin: 8.2 g/dL — ABNORMAL LOW (ref 13.0–17.0)
MCH: 29.4 pg (ref 26.0–34.0)
MCH: 29.6 pg (ref 26.0–34.0)
MCH: 29.4 pg (ref 26.0–34.0)
MCH: 29.1 pg (ref 26.0–34.0)
MCHC: 33.1 g/dL (ref 30.0–36.0)
MCHC: 33.3 g/dL (ref 30.0–36.0)
MCHC: 33.3 g/dL (ref 30.0–36.0)
MCHC: 33.3 g/dL (ref 30.0–36.0)
MCV: 89.1 fL (ref 78.0–100.0)
MCV: 88.9 fL (ref 78.0–100.0)
MCV: 88.2 fL (ref 78.0–100.0)
MCV: 87.4 fL (ref 78.0–100.0)
PLATELETS: 131 10*3/uL — AB (ref 150–400)
PLATELETS: 144 10*3/uL — AB (ref 150–400)
Platelets: 126 10*3/uL — ABNORMAL LOW (ref 150–400)
Platelets: 165 10*3/uL (ref 150–400)
RBC: 2.65 MIL/uL — ABNORMAL LOW (ref 4.22–5.81)
RBC: 2.7 MIL/uL — AB (ref 4.22–5.81)
RBC: 2.79 MIL/uL — ABNORMAL LOW (ref 4.22–5.81)
RBC: 2.78 MIL/uL — AB (ref 4.22–5.81)
RDW: 14.2 % (ref 11.5–15.5)
RDW: 14 % (ref 11.5–15.5)
RDW: 13.6 % (ref 11.5–15.5)
RDW: 13.4 % (ref 11.5–15.5)
WBC: 9.1 10*3/uL (ref 4.0–10.5)
WBC: 9.3 10*3/uL (ref 4.0–10.5)
WBC: 9.9 10*3/uL (ref 4.0–10.5)
WBC: 10.2 10*3/uL (ref 4.0–10.5)

## 2014-10-13 LAB — COMPREHENSIVE METABOLIC PANEL
ALBUMIN: 1.6 g/dL — AB (ref 3.5–5.2)
ALT: 16 U/L (ref 0–53)
ANION GAP: 4 — AB (ref 5–15)
AST: 22 U/L (ref 0–37)
Alkaline Phosphatase: 35 U/L — ABNORMAL LOW (ref 39–117)
BUN: 11 mg/dL (ref 6–23)
CO2: 29 mmol/L (ref 19–32)
Calcium: 7.3 mg/dL — ABNORMAL LOW (ref 8.4–10.5)
Chloride: 112 mmol/L (ref 96–112)
Creatinine, Ser: 0.73 mg/dL (ref 0.50–1.35)
GFR calc Af Amer: >90 mL/min (ref >90)
GFR calc non Af Amer: >90 mL/min (ref >90)
Glucose, Bld: 121 mg/dL — ABNORMAL HIGH (ref 70–99)
POTASSIUM: 3.7 mmol/L (ref 3.5–5.1)
SODIUM: 145 mmol/L (ref 135–145)
TOTAL PROTEIN: 3.9 g/dL — AB (ref 6.0–8.3)
Total Bilirubin: 0.6 mg/dL (ref 0.3–1.2)

## 2014-10-13 LAB — DIFFERENTIAL
BASOS ABS: 0 10*3/uL (ref 0.0–0.1)
BASOS PCT: 0 % (ref 0–1)
EOS ABS: 0.2 10*3/uL (ref 0.0–0.7)
EOS PCT: 2 % (ref 0–5)
Lymphocytes Relative: 7 % — ABNORMAL LOW (ref 12–46)
Lymphs Abs: 0.6 10*3/uL — ABNORMAL LOW (ref 0.7–4.0)
MONOS PCT: 7 % (ref 3–12)
Monocytes Absolute: 0.7 10*3/uL (ref 0.1–1.0)
Neutro Abs: 7.7 10*3/uL (ref 1.7–7.7)
Neutrophils Relative %: 84 % — ABNORMAL HIGH (ref 43–77)

## 2014-10-13 LAB — BLOOD GAS, ARTERIAL
ACID-BASE DEFICIT: 5.4 mmol/L — AB (ref 0.0–2.0)
Bicarbonate: 19.9 mEq/L — ABNORMAL LOW (ref 20.0–24.0)
Drawn by: 307971
FIO2: 0.6 %
LHR: 15 {breaths}/min
MECHVT: 600 mL
O2 Saturation: 99.2 %
PATIENT TEMPERATURE: 37
PEEP: 5 cmH2O
PO2 ART: 207 mmHg — AB (ref 80.0–100.0)
TCO2: 18.7 mmol/L (ref 0–100)
pCO2 arterial: 40.5 mmHg (ref 35.0–45.0)
pH, Arterial: 7.313 — ABNORMAL LOW (ref 7.350–7.450)

## 2014-10-13 LAB — GLUCOSE, CAPILLARY
Glucose-Capillary: 112 mg/dL — ABNORMAL HIGH (ref 70–99)
Glucose-Capillary: 106 mg/dL — ABNORMAL HIGH (ref 70–99)
Glucose-Capillary: 112 mg/dL — ABNORMAL HIGH (ref 70–99)
Glucose-Capillary: 97 mg/dL (ref 70–99)

## 2014-10-13 LAB — TRIGLYCERIDES: Triglycerides: 43 mg/dL (ref <150)

## 2014-10-13 LAB — PHOSPHORUS: Phosphorus: 3.6 mg/dL (ref 2.3–4.6)

## 2014-10-13 LAB — MAGNESIUM: MAGNESIUM: 2.1 mg/dL (ref 1.5–2.5)

## 2014-10-13 MED ORDER — TRACE MINERALS CR-CU-F-FE-I-MN-MO-SE-ZN IV SOLN
INTRAVENOUS | Status: AC
  Administered 2014-10-13: 18:00:00 via INTRAVENOUS
  Filled 2014-10-13: qty 1992

## 2014-10-13 MED ORDER — FAT EMULSION 20 % IV EMUL
240.0000 mL | INTRAVENOUS | Status: AC
  Administered 2014-10-13: 240 mL via INTRAVENOUS
  Filled 2014-10-13: qty 250

## 2014-10-13 NOTE — Progress Notes (Signed)
PULMONARY / CRITICAL CARE MEDICINE   Name: [name] MRN: [id number] DOB: 07/12/1961    ADMISSION DATE:  10/08/2014 CONSULTATION DATE:  10/08/14  REFERRING MD :  Madilyn Hookees   CHIEF COMPLAINT:  Melena, near-syncope  INITIAL PRESENTATION: Mr. [name] is a 54 year old man with a history of ETOH abuse and recurrent UGIB due to ulcers who presented to ED 3/9 with melena and near-syncope. Patient was [facility] 3/6 with same problem and left AMA 3/8 in am.   STUDIES:  3/7 endoscopy -  Large non-bleeding and deep ulcer with visible vessel in duodenal bulb. Normal mucosa of esophagus and stomach.  [facility], IR angiogram> no vessel feeding duodenum identified  SIGNIFICANT EVENTS: 3/7 endoscopy with visible vessel, high risk for intervention 3/9 IR embolization could not identify a vessel 3/10 overnight passed bright red blood per rectum,  3/10: EGD. Endo-clipping of visible vessel w/in duodenal ulcer.  3/11 extubated. hgb drifted 1 gm over night.  3/11 Exploratory laparotomy,  over sewing of duodenal ulcer and external over sewing of feeder vessels   SUBJECTIVE: Wants ice chips.  VITAL SIGNS: Temp:  [97.8 F (36.6 C)-98.9 F (37.2 C)] 98.6 F (37 C) (03/14 0738) Pulse Rate:  [71-100] 77 (03/14 1058) Resp:  [0-26] 11 (03/14 1058) BP: (133-152)/(76-86) 150/76 mmHg (03/14 0937) SpO2:  [95 %-100 %] 97 % (03/14 1058) Weight:  [194 lb 0.1 oz (88 kg)] 194 lb 0.1 oz (88 kg) (03/14 0400) INTAKE / OUTPUT:  Intake/Output Summary (Last 24 hours) at 10/13/14 1123 Last data filed at 10/13/14 1000  Gross per 24 hour  Intake 2670.67 ml  Output   3085 ml  Net -414.33 ml    PHYSICAL EXAMINATION: General: no distress Neuro: normal strength HEENT: NG in place Cardiovascular: regular Lungs: no wheeze Abdomen: wound dressing clean Musculoskeletal: 1+ edema Skin:  No rashes  LABS:  CBC  Recent Labs Lab 10/12/14 2205 10/13/14 0500  10/13/14 0700  WBC 10.5 9.1 9.3  HGB 8.1* 7.8* 8.0*  HCT 24.2* 23.6* 24.0*  PLT 107* 126* 131*   Coag's  Recent Labs Lab 10/08/14 1001 10/10/14 1515  APTT 26  --   INR 1.14 1.54*   BMET  Recent Labs Lab 10/11/14 0510 10/12/14 0545 10/13/14 0500  NA 144 147* 145  K 3.7 3.5 3.7  CL 118* 116* 112  CO2 23 28 29   BUN 19 15 11   CREATININE 1.05 0.97 0.73  GLUCOSE 131* 107* 121*   Electrolytes  Recent Labs Lab 10/10/14 1720 10/11/14 0510 10/12/14 0545 10/13/14 0500  CALCIUM 7.0* 7.1* 7.4* 7.3*  MG 1.5 2.2  --  2.1  PHOS 4.4 3.9  --  3.6   ABG  Recent Labs Lab 10/10/14 1442 10/10/14 1508 10/10/14 1730  PHART 7.386 7.292* 7.313*  PCO2ART 39.9 38.9 40.5  PO2ART 252.0* 257.0* 207.0*   Liver Enzymes  Recent Labs Lab 10/08/14 0954 10/11/14 0510 10/13/14 0500  AST 26 33 22  ALT 13 26 16   ALKPHOS 30* 31* 35*  BILITOT 0.2* 0.6 0.6  ALBUMIN 2.1* 1.7* 1.6*   Glucose  Recent Labs Lab 10/11/14 1853 10/12/14 0027 10/12/14 0556 10/12/14 1206 10/12/14 1731 10/12/14 2343  GLUCAP 66* 106* 103* 103* 109* 112*    Imaging   ASSESSMENT / PLAN:  GASTROINTESTINAL A:   Acute upper GI bleeding 2nd to duodenal ulcer in setting of ETOH and cocaine abuse. P: Postop care, diet per [facility] Continue protonix  HEMATOLOGIC A:  Anemia due to acute blood loss >> total as of 3/11 (8 units PRBC and 2 FFP). Thrombocytopenia. P:  F/u CBC Transfuse for Hb < 7 or bleeding SCDs  NEUROLOGIC A:   Personality disorder with hx of ETOH, cocaine abuse. P:   Thiamine, folic acid   PULMONARY OETT 3/10>> 3/11,  3/11 >> 3/13 A: Acute respiratory failure 2nd to aspiration PNA. P:   F/u CXR intermittently Oxygen to keep SpO2 > 92%  CARDIOVASCULAR A:  Hypotension, tachycardia 2nd to GI bleeding and sepsis. P:  Goal even fluid status  RENAL A:  No acute issues. P:   Monitor renal fx, urine outpt  INFECTIOUS A:   Aspiration pneumonia. P:   Day 3 of  unasyn  Sputum 3/12 >>  ENDOCRINE A:   No active issues. P:   Trend glucose on BMET  Change to SDU status 3/14.  Transfer to [facility] 3/15 and PCCM off.  Coralyn Helling, MD [facility] Pulmonary/Critical Care 10/13/2014, 11:31 AM Pager:  [phone] After 3pm call: [phone]

## 2014-10-13 NOTE — Progress Notes (Signed)
NUTRITION FOLLOW UP  Intervention:   - Continue TPN per pharmacy - RD will continue to monitor for diet advancement  Nutrition Dx:   Inadequate oral intake related to inability to eat as evidenced by NPO; ongoing  Goal:   Pt to meet >/= 90% of their estimated nutrition needs; improving with TPN advancement  Monitor:   TPN tolerance and advancement, labs, I/O's, weight trend  Assessment:   54 year old man with a history of ETOH abuse and recurrent UGIB due to ulcers who presented to ED 3/9 with melena and near-syncope. Patient [facility] 3[facility] with same problem and left AMA 3/8 in am.   3/11- s/p exploratory laparotomy, over sewing of duodenal ulcer and external over sewing of feeder vessels 3/13- extubated  - Pt with no obvious fat or muscle wasting - Wt still increased due to fluid overload.  - Pt can have ice chips per surgery. Still NPO otherwise.  - Per RN, pt tolerating TPN E 5/15 @ 60 mL/hr. - Labs reviewed  Plan per pharmacy: At 18:00 tonight:  Increase Clinimix E 5/15 to goal rate of 83 ml/hr  Start lipids at 10 ml/hr  TPN to contain standard MVI and trace elements daily  Height: Ht Readings from Last 1 Encounters:  10/08/14 5\' 9"  (1.753 m)    Weight Status:   Wt Readings from Last 1 Encounters:  10/13/14 194 lb 0.1 oz (88 kg)    Re-estimated needs:  Kcal: 1800-2000 Protein: 100-115 g Fluid: Per MD  Skin: closed incision on abdomen  Diet Order: .TPN (CLINIMIX-E) Adult Diet NPO time specified Except for: Ice Chips TPN (CLINIMIX-E) Adult   Intake/Output Summary (Last 24 hours) at 10/13/14 1251 Last data filed at 10/13/14 1000  Gross per 24 hour  Intake 2485.67 ml  Output   3085 ml  Net -599.33 ml    Last BM: 3/11   Labs:   Recent Labs Lab 10/10/14 1720 10/11/14 0510 10/12/14 0545 10/13/14 0500  NA 142 144 147* 145  K 4.2 3.7 3.5 3.7  CL 119* 118* 116* 112  CO2 22 23 28 29   BUN 24* 19 15 11   CREATININE 1.14 1.05  0.97 0.73  CALCIUM 7.0* 7.1* 7.4* 7.3*  MG 1.5 2.2  --  2.1  PHOS 4.4 3.9  --  3.6  GLUCOSE 168* 131* 107* 121*    CBG (last 3)   Recent Labs  10/12/14 1731 10/12/14 2343 10/13/14 1127  GLUCAP 109* 112* 112*    Scheduled Meds: . ampicillin-sulbactam (UNASYN) IV  3 g Intravenous Q6H  . antiseptic oral rinse  7 mL Mouth Rinse QID  . chlorhexidine  15 mL Mouth Rinse BID  . insulin aspart  0-9 Units Subcutaneous 4 times per day    Continuous Infusions: . Marland Kitchen.TPN (CLINIMIX-E) Adult 60 mL/hr at 10/12/14 1843  . dextrose 5 % and 0.45% NaCl 10 mL/hr at 10/12/14 1027  . Marland Kitchen.TPN (CLINIMIX-E) Adult     And  . fat emulsion    . pantoprozole (PROTONIX) infusion 8 mg/hr (10/12/14 1355)    Emmaline KluverHaley Wyonia Fontanella MS, RD, LDN [phone]

## 2014-10-13 NOTE — Plan of Care (Signed)
Problem: Phase I Progression Outcomes Goal: OOB as tolerated unless otherwise ordered Outcome: Progressing oob with physical therapy. To chair

## 2014-10-13 NOTE — Progress Notes (Deleted)
3 Days Post-Op  Subjective: He is demanding ice chips.  He talks over you. He is alert and at his baseline.  No flatus.  Objective: Vital signs in last 24 hours: Temp:  [97.8 F (36.6 C)-98.9 F (37.2 C)] 98.1 F (36.7 C) (03/14 0400) Pulse Rate:  [67-100] 94 (03/14 0600) Resp:  [0-26] 22 (03/14 0600) BP: (132-152)/(61-86) 152/86 mmHg (03/13 1945) SpO2:  [94 %-100 %] 100 % (03/14 0600) FiO2 (%):  [30 %] 30 % (03/13 1020) Weight:  [194 lb 0.1 oz (88 kg)] 194 lb 0.1 oz (88 kg) (03/14 0400) Last BM Date: 10/09/14 NPO TNA 375 from NG recorded 500 ml from drain recorded. Afebrile,, VSS Labs stable CXR: unchanged Intake/Output from previous day: 03/13 0701 - 03/14 0700 In: 2733 [I.V.:867.3; NG/GT:90; IV Piggyback:400; TPN:1145.7] Out: 2830 [Urine:1955; Emesis/NG output:375; Drains:500] Intake/Output this shift:    General appearance: alert and no distress Resp: rales in bases GI: soft, sore, wound looks good.  Few BS, hypoactive.  No flatus.  Lab Results:   Recent Labs  10/13/14 0500 10/13/14 0700  WBC 9.1 9.3  HGB 7.8* 8.0*  HCT 23.6* 24.0*  PLT 126* 131*    BMET  Recent Labs  10/12/14 0545 10/13/14 0500  NA 147* 145  K 3.5 3.7  CL 116* 112  CO2 28 29  GLUCOSE 107* 121*  BUN 15 11  CREATININE 0.97 0.73  CALCIUM 7.4* 7.3*   PT/INR  Recent Labs  10/10/14 1515  LABPROT 18.7*  INR 1.54*     Recent Labs Lab 10/08/14 0954 10/11/14 0510 10/13/14 0500  AST 26 33 22  ALT ALKPHOS 30* 31* 35*  BILITOT 0.2* 0.6 0.6  PROT 3.8* 3.4* 3.9*  ALBUMIN 2.1* 1.7* 1.6*     Lipase     Component Value Date/Time   LIPASE 42 05/27/2013 0900     Studies/Results: Dg Chest Port 1 View  10/13/2014   CLINICAL DATA:  Acute respiratory failure  EXAM: PORTABLE CHEST - 1 VIEW  COMPARISON:  10/12/2014  FINDINGS: The endotracheal tube has been removed. The nasogastric tube extends into the stomach and off the inferior edge of the image. The right jugular  central line extends into the low SVC.  Airspace opacities persist bilaterally without significant interval change.  IMPRESSION: Extubated.  Airspace opacities persist unchanged.   Electronically Signed   By: Ellery Plunk M.D.   On: 10/13/2014 05:55   Dg Chest Port 1 View  10/12/2014   CLINICAL DATA:  Fever air  EXAM: PORTABLE CHEST - 1 VIEW  COMPARISON:  Portable exam 0522 hr compared to 10/11/2014  FINDINGS: Tip of endotracheal tube projects 2.9 cm above carina.  Nasogastric tube extends into stomach.  RIGHT jugular central venous catheter tip projects over distal SVC.  Normal heart size, mediastinal contours and pulmonary vascularity.  Persistent RIGHT basilar atelectasis.  Peribronchial thickening diffuse BILATERAL interstitial infiltrates little changed.  Slightly increased atelectasis versus consolidation in retrocardiac LEFT lower lobe.  LEFT apical scarring and noted.  No gross pleural effusion or pneumothorax.  IMPRESSION: Persistent pulmonary infiltrates with persistent subsegmental atelectasis at RIGHT lung base and slightly increased atelectasis versus consolidation in LEFT lower lobe.   Electronically Signed   By: Ulyses Southward M.D.   On: 10/12/2014 07:17    Medications: . sodium chloride   Intravenous Once  . sodium chloride   Intravenous Once  . sodium chloride   Intravenous Once  . ampicillin-sulbactam (UNASYN) IV  3  g Intravenous Q6H  . antiseptic oral rinse  7 mL Mouth Rinse QID  . chlorhexidine  15 mL Mouth Rinse BID  . insulin aspart  0-9 Units Subcutaneous 4 times per day    Assessment/Plan 1.  Recurrent duodenal bleed; s/p oversewing of duodenal ulcer 08/10/12, and embolization of the gastroduodenal artery 05/03/13. 1A.  Exploratory laparotomy, gastrorrhaphy, over sewing of duodenal ulcer and external over sewing of feeder vessels to be devascularized, 10/10/2014, Thornton ParkMatthew B. Daphine DeutscherMartin, MD. 1B.  Upper endoscopy with control of bleeding,Endo clip application, 10/09/14, Dr.  Rhea BeltonPyrtle 2. Hx of ETOH abuse/ cocaine use/marijuana use. 3. Psychiatric evaluation: pt did not have capacity 05/01/13. 4. Severe FE deficiency 5. Malnutrition albumin 1.9, total protein, 3.8. 6. Tobacco use 7. Hypokalemia  8. Cocaine positive drug screen 9.  Multiple transfusions 10.  SCD for DVT, will defer chemical prophylaxis to Medicine.   Plan:  Ice chips, will check on UGI.  Keep NG till he has some bowel function. Wet to dry dressings to wound.  From our standpoint the A line can come out, will defer to CCM.         LOS: 5 days    Natarsha Hurwitz 10/13/2014

## 2014-10-13 NOTE — Evaluation (Signed)
Physical Therapy Evaluation Patient Details Name: Ryan GradRandy Frazier MRN: [id number] DOB: 02/14/1961 Today's Date: 10/13/2014   History of Present Illness  54 yo male s/p exp lap, gastrorrhaphy 10/10/14. Extubated 10/12/14. Hx of polysubstance abuse, anemia, HTN, recurrent UGIB with ulcers.   Clinical Impression  On eval, pt required Max assist +2 for mobility-able to perform stand pivot with RW from bed to recliner. Max encouragement for participation. Pt agitated and using profanity throughout session, for no particular reason. At this time, recommending SNF.      Follow Up Recommendations SNF;Supervision/Assistance - 24 hour    Equipment Recommendations  Rolling walker with 5" wheels    Recommendations for Other Services OT consult     Precautions / Restrictions Precautions Precautions: Fall Precaution Comments: NG tube, drain, abdominal surgery Restrictions Weight Bearing Restrictions: No      Mobility  Bed Mobility Overal bed mobility: Needs Assistance Bed Mobility: Supine to Sit     Supine to sit: Max assist;+2 for physical assistance;+2 for safety/equipment;HOB elevated     General bed mobility comments: Assist for bil LEs and trunk to upright. Attempted to get pt to roll but pt would not perform. HOB elevated to ~60 degrees. Utilized bedpad for scooting, positioning.   Transfers Overall transfer level: Needs assistance Equipment used: Rolling walker (2 wheeled) Transfers: Sit to/from [employer] CorporationStand;Stand Pivot Transfers Sit to Stand: +2 physical assistance;Max assist;+2 safety/equipment;From elevated surface Stand pivot transfers: Max assist;+2 physical assistance;+2 safety/equipment;From elevated surface       General transfer comment: Assist to rise, stabilize, control descent. Multimodal cues for safety, technique, hand placement. Stand pivot from bed to recliner with RW.   Ambulation/Gait             General Gait Details: NT-pt not willing to progress  activity  Stairs            Wheelchair Mobility    Modified Rankin (Stroke Patients Only)       Balance Overall balance assessment: Needs assistance         Standing balance support: Bilateral upper extremity supported;During functional activity Standing balance-Leahy Scale: Poor                               Pertinent Vitals/Pain Pain Assessment: Faces Faces Pain Scale: Hurts whole lot Pain Location: abdomen Pain Descriptors / Indicators: Sore;Aching Pain Intervention(s): Limited activity within patient's tolerance;Repositioned;RN gave pain meds during session    Home Living                        Prior Function           Comments: pt would not answer any PLOF/home environment questions. Did state he uses a scooter?     Hand Dominance        Extremity/Trunk Assessment   Upper Extremity Assessment: Generalized weakness           Lower Extremity Assessment: Generalized weakness         Communication   Communication: No difficulties  Cognition Arousal/Alertness: Awake/alert Behavior During Therapy: Agitated (profane language throughout session) Overall Cognitive Status: Difficult to assess (pt not very cooperative)                      General Comments      Exercises        Assessment/Plan    PT Assessment Patient needs continued PT services  PT  Diagnosis Difficulty walking;Generalized weakness;Acute pain   PT Problem List Decreased strength;Decreased activity tolerance;Decreased balance;Decreased mobility;Decreased knowledge of use of DME;Pain  PT Treatment Interventions DME instruction;Gait training;Functional mobility training;Therapeutic activities;Therapeutic exercise;Patient/family education;Balance training   PT Goals (Current goals can be found in the Care Plan section) Acute Rehab PT Goals Patient Stated Goal: none stated PT Goal Formulation: With patient Time For Goal Achievement:  10/27/14 Potential to Achieve Goals: Fair    Frequency Min 3X/week   Barriers to discharge        Co-evaluation               End of Session Equipment Utilized During Treatment: Oxygen Activity Tolerance: Patient limited by pain Patient left: in chair;with call bell/phone within reach           Time: 1352-1413 PT Time Calculation (min) (ACUTE ONLY): 21 min   Charges:   PT Evaluation $Initial PT Evaluation Tier I: 1 Procedure     PT G Codes:        Rebeca Alert, MPT Pager: [phone]

## 2014-10-13 NOTE — Progress Notes (Signed)
3 Days Post-Op  Subjective: He is demanding ice chips. He talks over you. He is alert and at his baseline. No flatus.  Objective: Vital signs in last 24 hours: Temp: [97.8 F (36.6 C)-98.9 F (37.2 C)] 98.1 F (36.7 C) (03/14 0400) Pulse Rate: [67-100] 94 (03/14 0600) Resp: [0-26] 22 (03/14 0600) BP: (132-152)/(61-86) 152/86 mmHg (03/13 1945) SpO2: [94 %-100 %] 100 % (03/14 0600) FiO2 (%): [30 %] 30 % (03/13 1020) Weight: [194 lb 0.1 oz (88 kg)] 194 lb 0.1 oz (88 kg) (03/14 0400) Last BM Date: 10/09/14 NPO TNA 375 from NG recorded 500 ml from drain recorded. Afebrile,, VSS Labs stable CXR: unchanged Intake/Output from previous day: 03/13 0701 - 03/14 0700 In: 2733 [I.V.:867.3; NG/GT:90; IV Piggyback:400; TPN:1145.7] Out: 2830 [Urine:1955; Emesis/NG output:375; Drains:500] Intake/Output this shift:    General appearance: alert and no distress Resp: rales in bases GI: soft, sore, wound looks good. Few BS, hypoactive. No flatus.  Lab Results:   Recent Labs (last 2 labs)      Recent Labs  10/13/14 0500 10/13/14 0700  WBC 9.1 9.3  HGB 7.8* 8.0*  HCT 23.6* 24.0*  PLT 126* 131*      BMET  Recent Labs (last 2 labs)      Recent Labs  10/12/14 0545 10/13/14 0500  NA 147* 145  K 3.5 3.7  CL 116* 112  CO2 28 29  GLUCOSE 107* 121*  BUN 15 11  CREATININE 0.97 0.73  CALCIUM 7.4* 7.3*     PT/INR  Recent Labs (last 2 labs)      Recent Labs  10/10/14 1515  LABPROT 18.7*  INR 1.54*       Last Labs      Recent Labs Lab 10/08/14 0954 10/11/14 0510 10/13/14 0500  AST 26 33 22  ALT 13 26 16   ALKPHOS 30* 31* 35*  BILITOT 0.2* 0.6 0.6  PROT 3.8* 3.4* 3.9*  ALBUMIN 2.1* 1.7* 1.6*       Lipase   Labs (Brief)       Component Value Date/Time   LIPASE 42 05/27/2013 0900      Studies/Results:  Imaging Results (Last 48 hours)    Dg Chest Port 1  View  10/13/2014 CLINICAL DATA: Acute respiratory failure EXAM: PORTABLE CHEST - 1 VIEW COMPARISON: 10/12/2014 FINDINGS: The endotracheal tube has been removed. The nasogastric tube extends into the stomach and off the inferior edge of the image. The right jugular central line extends into the low SVC. Airspace opacities persist bilaterally without significant interval change. IMPRESSION: Extubated. Airspace opacities persist unchanged. Electronically Signed By: Ellery Plunkaniel R Mitchell M.D. On: 10/13/2014 05:55   Dg Chest Port 1 View  10/12/2014 CLINICAL DATA: Fever air EXAM: PORTABLE CHEST - 1 VIEW COMPARISON: Portable exam 0522 hr compared to 10/11/2014 FINDINGS: Tip of endotracheal tube projects 2.9 cm above carina. Nasogastric tube extends into stomach. RIGHT jugular central venous catheter tip projects over distal SVC. Normal heart size, mediastinal contours and pulmonary vascularity. Persistent RIGHT basilar atelectasis. Peribronchial thickening diffuse BILATERAL interstitial infiltrates little changed. Slightly increased atelectasis versus consolidation in retrocardiac LEFT lower lobe. LEFT apical scarring and noted. No gross pleural effusion or pneumothorax. IMPRESSION: Persistent pulmonary infiltrates with persistent subsegmental atelectasis at RIGHT lung base and slightly increased atelectasis versus consolidation in LEFT lower lobe. Electronically Signed By: Ulyses SouthwardMark Boles M.D. On: 10/12/2014 07:17     Medications: . sodium chloride  Intravenous Once  . sodium chloride  Intravenous Once  . sodium chloride  Intravenous Once  . ampicillin-sulbactam (UNASYN) IV 3 g Intravenous Q6H  . antiseptic oral rinse 7 mL Mouth Rinse QID  . chlorhexidine 15 mL Mouth Rinse BID  . insulin aspart 0-9 Units Subcutaneous 4 times per day    Assessment/Plan 1. Recurrent duodenal bleed; s/p oversewing of duodenal ulcer 08/10/12, and  embolization of the gastroduodenal artery 05/03/13. 1A. Exploratory laparotomy, gastrorrhaphy, over sewing of duodenal ulcer and external over sewing of feeder vessels to be devascularized, 10/10/2014, [place]. Daphine Deutscher, MD. 1B. Upper endoscopy with control of bleeding,Endo clip application, 10/09/14, Dr. Rhea Belton 2. Hx of ETOH abuse/ cocaine use/marijuana use. 3. Psychiatric evaluation: pt did not have capacity 05/01/13. 4. Severe FE deficiency 5. Malnutrition albumin 1.9, total protein, 3.8. 6. Tobacco use 7. Hypokalemia  8. Cocaine positive drug screen 9. Multiple transfusions 10. SCD for DVT, will defer chemical prophylaxis to Medicine.   Plan: Ice chips, will check on UGI. Keep NG till he has some bowel function. Wet to dry dressings to wound. From our standpoint the A line can come out, will defer to CCM.       LOS: 5 days    [name] 10/13/2014

## 2014-10-13 NOTE — Progress Notes (Signed)
PARENTERAL NUTRITION CONSULT NOTE - FOLLOW UP  Pharmacy Consult for TPN Indication: Massive GIB and needs bowel rest  Allergies  Allergen Reactions  . Chocolate Nausea And Vomiting  . Peanut-Containing Drug Products Nausea And Vomiting  . Spinach Nausea And Vomiting    Patient Measurements: Height: 5\' 9"  (175.3 cm) Weight: 194 lb 0.1 oz (88 kg) IBW/kg (Calculated) : 70.7 Adjusted Body Weight: 76 kg  Vital Signs: Temp: 98.6 F (37 C) (03/14 0738) Temp Source: Oral (03/14 0738) Pulse Rate: 94 (03/14 0600) Intake/Output from previous day: 03/13 0701 - 03/14 0700 In: 2733 [I.V.:867.3; NG/GT:90; IV Piggyback:400; TPN:1145.7] Out: 2830 [Urine:1955; Emesis/NG output:375; Drains:500] Intake/Output from this shift:    Labs:  Recent Labs  10/10/14 1515  10/12/14 2205 10/13/14 0500 10/13/14 0700  WBC 15.9*  < > 10.5 9.1 9.3  HGB 10.8*  < > 8.1* 7.8* 8.0*  HCT 32.0*  < > 24.2* 23.6* 24.0*  PLT 28*  < > 107* 126* 131*  INR 1.54*  --   --   --   --   < > = values in this interval not displayed.   Recent Labs  10/10/14 1720 10/11/14 0500 10/11/14 0510 10/12/14 0545 10/13/14 0500  NA 142  --  144 147* 145  K 4.2  --  3.7 3.5 3.7  CL 119*  --  118* 116* 112  CO2 22  --  23 28 29   GLUCOSE 168*  --  131* 107* 121*  BUN 24*  --  19 15 11   CREATININE 1.14  --  1.05 0.97 0.73  CALCIUM 7.0*  --  7.1* 7.4* 7.3*  MG 1.5  --  2.2  --  2.1  PHOS 4.4  --  3.9  --  3.6  PROT  --   --  3.4*  --  3.9*  ALBUMIN  --   --  1.7*  --  1.6*  AST  --   --  33  --  22  ALT  --   --  26  --  16  ALKPHOS  --   --  31*  --  35*  BILITOT  --   --  0.6  --  0.6  PREALBUMIN  --   --  10.9*  --   --   TRIG  --  67  --   --  43   Estimated Creatinine Clearance: 115.9 mL/min (by C-G formula based on Cr of 0.73).    Recent Labs  10/12/14 1206 10/12/14 1731 10/12/14 2343  GLUCAP 103* 109* 112*    Medications:  Scheduled:  . sodium chloride   Intravenous Once  . sodium chloride    Intravenous Once  . sodium chloride   Intravenous Once  . ampicillin-sulbactam (UNASYN) IV  3 g Intravenous Q6H  . antiseptic oral rinse  7 mL Mouth Rinse QID  . chlorhexidine  15 mL Mouth Rinse BID  . insulin aspart  0-9 Units Subcutaneous 4 times per day   Infusions:  . Marland Kitchen.TPN (CLINIMIX-E) Adult 60 mL/hr at 10/12/14 1843  . dextrose 5 % and 0.45% NaCl 10 mL/hr at 10/12/14 1027  . pantoprozole (PROTONIX) infusion 8 mg/hr (10/12/14 1355)   Insulin Requirements:  No Novolog sensitive scale used in past 24 hours  Current Nutrition:  NPO, ice chips Clinimix E 5/15 at 60 ml/hr  [facility]  Central access: triple lumen CVC  TPN start date: 3/12  ASSESSMENT  HPI: 83 YOM presents with melena and near-syncope.  Has h/o EtOH and cocaine abuse w/ previous UGIB d/t ulcers.  EGD 3/7 revealed large non-bleeding duodenal bulb ulcer.  3/9 IR angiogram did not identify bleeding vessel.  Patient intubated on 3/10 and EGD performed w/o evidence of active bleed and hemoclip placed on visible vessel.  He was extubated 3/11 and developed abdominal pain and vomited BRB x 3.  Taken to OR for ex lap and oversewing of ulcer.  J-P drain was placed.  Orders to start TPN d/t massive GIB and need for bowel rest.   Significant events:  3/11: ex lap for GI Bleed with oversewing of ulcer 3/12: remains sedated on vent, propofol currently at 30 mcg/kg/min = 15kcal/hr = 360kcal/day if this rate continues 3/14: propofol discontinued 3/13. Will add lipids to TPN orders today.   Today: 10/13/2014   Glucose - at goal < 150  Electrolytes - WNL, calcium corrects to WNL     Renal - Scr improved to WNL, adequate UOP  LFTs - WNL (note h/o EtOHism)   TGs - 67 (3/12), 43 (3/14)  Prealbumin - 10.9 (3/12)  NUTRITIONAL GOALS                                                                                              RD recs: Kcal: 1800-2000, Protein: 100-115 g Clinimix 5/15 at a goal rate of 10ml/hr + 20% fat emulsion at 57ml/hr to provide: 100g/day protein, 1894Kcal/day.  PLAN                                                                                                                          At 18:00 tonight:  Increase Clinimix E 5/15 to goal rate of 83 ml/hr  Start lipids at 10 ml/hr  TPN to contain standard MVI and trace elements daily  Continue CBC q4h sensitive sliding scale  IVF per MD  TPN labs every Monday and Thursday. Will check BMET, Mag, and Phos in AM since advancing TPN this evening.  F/u daily  Clance Boll, PharmD, BCPS Pager: [phone] 10/13/2014 8:42 AM

## 2014-10-14 DIAGNOSIS — J690 Pneumonitis due to inhalation of food and vomit: Secondary | ICD-10-CM | POA: Diagnosis present

## 2014-10-14 LAB — TYPE AND SCREEN
ABO/RH(D): O NEG
ANTIBODY SCREEN: NEGATIVE
Unit division: 0
Unit division: 0
Unit division: 0

## 2014-10-14 LAB — BASIC METABOLIC PANEL
Anion gap: 3 — ABNORMAL LOW (ref 5–15)
BUN: 11 mg/dL (ref 6–23)
CALCIUM: 7.5 mg/dL — AB (ref 8.4–10.5)
CO2: 32 mmol/L (ref 19–32)
Chloride: 106 mmol/L (ref 96–112)
Creatinine, Ser: 0.8 mg/dL (ref 0.50–1.35)
GFR calc Af Amer: >90 mL/min (ref >90)
GLUCOSE: 151 mg/dL — AB (ref 70–99)
Potassium: 3.6 mmol/L (ref 3.5–5.1)
Sodium: 141 mmol/L (ref 135–145)

## 2014-10-14 LAB — GLUCOSE, CAPILLARY
GLUCOSE-CAPILLARY: 110 mg/dL — AB (ref 70–99)
Glucose-Capillary: 125 mg/dL — ABNORMAL HIGH (ref 70–99)
Glucose-Capillary: 109 mg/dL — ABNORMAL HIGH (ref 70–99)
Glucose-Capillary: 102 mg/dL — ABNORMAL HIGH (ref 70–99)

## 2014-10-14 LAB — MAGNESIUM: Magnesium: 1.9 mg/dL (ref 1.5–2.5)

## 2014-10-14 LAB — CULTURE, RESPIRATORY

## 2014-10-14 LAB — CULTURE, RESPIRATORY W GRAM STAIN

## 2014-10-14 LAB — PHOSPHORUS: PHOSPHORUS: 3.9 mg/dL (ref 2.3–4.6)

## 2014-10-14 MED ORDER — SODIUM CHLORIDE 0.9 % IJ SOLN
10.0000 mL | INTRAMUSCULAR | Status: DC | PRN
  Administered 2014-10-15 – 2014-10-19 (×5): 10 mL
  Filled 2014-10-14 (×5): qty 40

## 2014-10-14 MED ORDER — FAT EMULSION 20 % IV EMUL
240.0000 mL | INTRAVENOUS | Status: AC
  Administered 2014-10-14: 240 mL via INTRAVENOUS
  Filled 2014-10-14: qty 250

## 2014-10-14 MED ORDER — PANTOPRAZOLE SODIUM 40 MG IV SOLR
40.0000 mg | Freq: Two times a day (BID) | INTRAVENOUS | Status: DC
  Administered 2014-10-14 – 2014-10-20 (×12): 40 mg via INTRAVENOUS
  Filled 2014-10-14 (×13): qty 40

## 2014-10-14 MED ORDER — TRACE MINERALS CR-CU-F-FE-I-MN-MO-SE-ZN IV SOLN
INTRAVENOUS | Status: AC
  Administered 2014-10-14: 18:00:00 via INTRAVENOUS
  Filled 2014-10-14: qty 1992

## 2014-10-14 MED ORDER — SODIUM CHLORIDE 0.9 % IJ SOLN
10.0000 mL | Freq: Two times a day (BID) | INTRAMUSCULAR | Status: DC
  Administered 2014-10-16 – 2014-10-18 (×4): 10 mL

## 2014-10-14 NOTE — Progress Notes (Signed)
Clinical Social Work  Psych CSW signing off and unit CSW to assist with SNF placement.  [place] End-Cobb TownHolly Diarra Kos, [place]CS[place] [phone]

## 2014-10-14 NOTE — Progress Notes (Addendum)
[facility] PROGRESS NOTE  [name] ZOX:[id number] DOB: [birth year] DOA: 10/08/2014 PCP: [name], MD  Summary I have seen and examined [name] at bedside and reviewed his chart. He was transferred to the hospitalist service from [facility] on 10/14/14. Appreciate GI/gen surgery. [name] is a 54 year old man with a history of ETOH abuse and recurrent UGIB due to ulcers who presented to the ED on 3/9 with melena and near-syncope. Patient was admitted to [facility] 3/6 with same problem and left AMA 3/8 in am.  His hospital course in the ICU is summarized below; -Acute GI bleed due to duodenal ulcer > 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, perpetuated by polysubstance abuse (cocaine and EtOH) 3/7 endoscopy with visible vessel, high risk for intervention 3/9 IR embolization could not identify a vessel 3/10 EGD: got ENDO clip of large visible ulcer (not actively bleeding at that time) 3/11 Exploratory laparotomy, gastrorrhaphy, over sewing of duodenal ulcer and external over sewing of feeder vessels Patient has NGT and right IJ tripple lumen. He is on treatment for aspiration PNA. He is not eager to give a history. He may eventually need rehab placement. He is stable for transfer to MedSurg. Will defer to Gen/GI regarding Bleeding duodenal ulcer. Plan Status post laparotomy with oversew of bleeding duodenal artery/Duodenal ulcer/UGIB (upper gastrointestinal bleed)/Acute post-hemorrhagic anemia/Duodenal ulcer hemorrhage/Acute blood loss anemia  Continue PPI  NGT per surgery  On TPN  PPI  Transfuse prbc if Hb<7g/dl.  Defer rest of management to Surgery/GI Aspiration pna  Clinically better.  Day 4/7-10 antibiotics   Polysubstance abuse  Supportive management Hypertension  BP in acceptable range.  Monitor.  Code Status: Full Family Communication: None at bedside. Disposition Plan: To MedSurg. PT Eval. Case management consult to explore d/c  options.   Consultants:  GI  General surgery  IR.  Procedures: 3/7 endoscopy with visible vessel, high risk for intervention 3/9 IR embolization could not identify a vessel 3/10 EGD: got ENDO clip of large visible ulcer (not actively bleeding at that time) 3/11 Exploratory laparotomy, gastrorrhaphy, over sewing of duodenal ulcer and external over sewing of feeder vessels  Antibiotics:  Unasyn 10/11/14>>  HPI/Subjective: Dismissive to questions. Wants NGT out.  Objective: Filed Vitals:   10/14/14 1157  BP: 140/93  Pulse:   Temp:   Resp: 22    Intake/Output Summary (Last 24 hours) at 10/14/14 1158 Last data filed at 10/14/14 1100  Gross per 24 hour  Intake   2133 ml  Output   3735 ml  Net  -1602 ml   Filed Weights   10/12/14 0441 10/13/14 0400 10/14/14 0400  Weight: 85.3 kg (188 lb 0.8 oz) 88 kg (194 lb 0.1 oz) 84.1 kg (185 lb 6.5 oz)    Exam:   General:  Sitting up in recliner chair comfortably. Right IJ line. NGT in place.  Cardiovascular: S1-S2 normal. No murmurs. Pulse regular.  Respiratory: Good air entry bilaterally. No rhonchi or rales.  Abdomen: Surgical wound with clean dressing. Tender to palpation..  Musculoskeletal: No pedal edema   Neurological: Intact  Data Reviewed: Basic Metabolic Panel:  Recent Labs Lab 10/10/14 1720 10/11/14 0510 10/12/14 0545 10/13/14 0500 10/14/14 0410  NA 142 144 147* 145 141  K 4.2 3.7 3.5 3.7 3.6  CL 119* 118* 116* 112 106  CO2 32  GLUCOSE 168* 131* 107* 121* 151*  BUN 24* CREATININE 1.14 1.05 0.97 0.73 0.80  CALCIUM 7.0* 7.1*  7.4* 7.3* 7.5*  MG 1.5 2.2  --  2.1 1.9  PHOS 4.4 3.9  --  3.6 3.9   Liver Function Tests:  Recent Labs Lab 10/08/14 0954 10/11/14 0510 10/13/14 0500  AST 26 33 22  ALT ALKPHOS 30* 31* 35*  BILITOT 0.2* 0.6 0.6  PROT 3.8* 3.4* 3.9*  ALBUMIN 2.1* 1.7* 1.6*   No results for input(s): LIPASE, AMYLASE in the last 168 hours. No  results for input(s): AMMONIA in the last 168 hours. CBC:  Recent Labs Lab 10/11/14 0510  10/12/14 2205 10/13/14 0500 10/13/14 0700 10/13/14 1350 10/13/14 2225  WBC 12.5*  < > 10.5 9.1 9.3 9.9 10.2  NEUTROABS 10.8*  --   --  7.7  --   --   --   HGB 9.2*  < > 8.1* 7.8* 8.0* 8.2* 8.1*  HCT 26.6*  < > 24.2* 23.6* 24.0* 24.6* 24.3*  MCV 86.9  < > 88.6 89.1 88.9 88.2 87.4  PLT 39*  < > 107* 126* 131* 144* 165  < > = values in this interval not displayed. Cardiac Enzymes: No results for input(s): CKTOTAL, CKMB, CKMBINDEX, TROPONINI in the last 168 hours. BNP (last 3 results) No results for input(s): BNP in the last 8760 hours.  ProBNP (last 3 results) No results for input(s): PROBNP in the last 8760 hours.  CBG:  Recent Labs Lab 10/12/14 1731 10/12/14 2343 10/13/14 1127 10/13/14 1738 10/13/14 2344  GLUCAP 109* 112* 112* 97 125*    Recent Results (from the past 240 hour(s))  MRSA PCR Screening     Status: None   Collection Time: 10/05/14 11:53 PM  Result Value Ref Range Status   MRSA by PCR NEGATIVE NEGATIVE Final    Comment:        The GeneXpert MRSA Assay (FDA approved for NASAL specimens only), is one component of a comprehensive MRSA colonization surveillance program. It is not intended to diagnose MRSA infection nor to guide or monitor treatment for MRSA infections.   MRSA PCR Screening     Status: None   Collection Time: 10/08/14  2:02 PM  Result Value Ref Range Status   MRSA by PCR NEGATIVE NEGATIVE Final    Comment:        The GeneXpert MRSA Assay (FDA approved for NASAL specimens only), is one component of a comprehensive MRSA colonization surveillance program. It is not intended to diagnose MRSA infection nor to guide or monitor treatment for MRSA infections. Performed at [facility]   Culture, respiratory (NON-Expectorated)     Status: None   Collection Time: 10/11/14 10:45 AM  Result Value Ref Range Status   Specimen Description  TRACHEAL ASPIRATE  Final   Special Requests NONE  Final   Gram Stain   Final    ABUNDANT WBC PRESENT, PREDOMINANTLY PMN NO SQUAMOUS EPITHELIAL CELLS SEEN NO ORGANISMS SEEN Performed at [facility]    Culture   Final    Non-Pathogenic Oropharyngeal-type Flora Isolated. Performed at [facility]    Report Status 10/14/2014 FINAL  Final     Studies: Dg Chest Port 1 View  10/13/2014   CLINICAL DATA:  Acute respiratory failure  EXAM: PORTABLE CHEST - 1 VIEW  COMPARISON:  10/12/2014  FINDINGS: The endotracheal tube has been removed. The nasogastric tube extends into the stomach and off the inferior edge of the image. The right jugular central line extends into the low SVC.  Airspace opacities persist bilaterally without  significant interval change.  IMPRESSION: Extubated.  Airspace opacities persist unchanged.   Electronically Signed   By: Ellery Plunkaniel R Mitchell M.D.   On: 10/13/2014 05:55    Scheduled Meds: . ampicillin-sulbactam (UNASYN) IV  3 g Intravenous Q6H  . antiseptic oral rinse  7 mL Mouth Rinse QID  . chlorhexidine  15 mL Mouth Rinse BID  . insulin aspart  0-9 Units Subcutaneous 4 times per day   Continuous Infusions: . dextrose 5 % and 0.45% NaCl 10 mL/hr at 10/12/14 1027  . Marland Kitchen.TPN (CLINIMIX-E) Adult 83 mL/hr at 10/13/14 1822   And  . fat emulsion 240 mL (10/13/14 1822)  . Marland Kitchen.TPN (CLINIMIX-E) Adult     And  . fat emulsion    . pantoprozole (PROTONIX) infusion 8 mg/hr (10/14/14 0800)     Time spent: 25 minutes    Marcianne Ozbun  [facility] Pager [phone]. If 7PM-7AM, please contact night-coverage at [email or web address], password [email or web address] 10/14/2014, 11:58 AM  LOS: 6 days

## 2014-10-14 NOTE — Progress Notes (Signed)
4 Days Post-Op  Subjective: Asking about removing NG, generalized complaints.  No significant issues he looks pretty good.  Objective: Vital signs in last 24 hours: Temp:  [97.8 F (36.6 C)-99.1 F (37.3 C)] 99.1 F (37.3 C) (03/15 0400) Pulse Rate:  [74-80] 76 (03/14 1300) Resp:  [11-27] 27 (03/15 0440) BP: (122-164)/(65-91) 160/91 mmHg (03/15 0440) SpO2:  [93 %-100 %] 93 % (03/15 0440) Weight:  [84.1 kg (185 lb 6.5 oz)] 84.1 kg (185 lb 6.5 oz) (03/15 0400) Last BM Date: 10/09/14 375 from the NG 520 from the drain Afebrile, BP up some, Labs OK Intake/Output from previous day: 03/14 0701 - 03/15 0700 In: 2513 [I.V.:670; IV Piggyback:400; TPN:1443] Out: 3420 [Urine:2525; Emesis/NG output:375; Drains:520] Intake/Output this shift:    General appearance: alert and no distress Resp: clear to auscultation bilaterally and anterior exam, I do not see an IS in the room. He says he's doing it, but I have doubts. GI: Open wound looks good, NG working, Not allot of bowel sounds.  Lab Results:   Recent Labs  10/13/14 1350 10/13/14 2225  WBC 9.9 10.2  HGB 8.2* 8.1*  HCT 24.6* 24.3*  PLT 144* 165    BMET  Recent Labs  10/13/14 0500 10/14/14 0410  NA 145 141  K 3.7 3.6  CL 112 106  CO2 29 32  GLUCOSE 121* 151*  BUN 11 11  CREATININE 0.73 0.80  CALCIUM 7.3* 7.5*   PT/INR No results for input(s): LABPROT, INR in the last 72 hours.   Recent Labs Lab 10/08/14 0954 10/11/14 0510 10/13/14 0500  AST 26 33 22  ALT 13 26 16   ALKPHOS 30* 31* 35*  BILITOT 0.2* 0.6 0.6  PROT 3.8* 3.4* 3.9*  ALBUMIN 2.1* 1.7* 1.6*     Lipase     Component Value Date/Time   LIPASE 42 05/27/2013 0900     Studies/Results: Dg Chest Port 1 View  10/13/2014   CLINICAL DATA:  Acute respiratory failure  EXAM: PORTABLE CHEST - 1 VIEW  COMPARISON:  10/12/2014  FINDINGS: The endotracheal tube has been removed. The nasogastric tube extends into the stomach and off the inferior edge of the  image. The right jugular central line extends into the low SVC.  Airspace opacities persist bilaterally without significant interval change.  IMPRESSION: Extubated.  Airspace opacities persist unchanged.   Electronically Signed   By: Ellery Plunkaniel R Mitchell M.D.   On: 10/13/2014 05:55    Medications: . ampicillin-sulbactam (UNASYN) IV  3 g Intravenous Q6H  . antiseptic oral rinse  7 mL Mouth Rinse QID  . chlorhexidine  15 mL Mouth Rinse BID  . insulin aspart  0-9 Units Subcutaneous 4 times per day   . dextrose 5 % and 0.45% NaCl 10 mL/hr at 10/12/14 1027  . Marland Kitchen.TPN (CLINIMIX-E) Adult 83 mL/hr at 10/13/14 1822   And  . fat emulsion 240 mL (10/13/14 1822)  . pantoprozole (PROTONIX) infusion 8 mg/hr (10/13/14 1000)   Assessment/Plan 1. Recurrent duodenal bleed; s/p oversewing of duodenal ulcer 08/10/12, and embolization of the gastroduodenal artery 05/03/13. 1A. Exploratory laparotomy, gastrorrhaphy, over sewing of duodenal ulcer and external over sewing of feeder vessels to be devascularized, 10/10/2014, Thornton ParkMatthew B. Daphine DeutscherMartin, MD. 1B. Upper endoscopy with control of bleeding,Endo clip application, 10/09/14, Dr. Rhea BeltonPyrtle 2. Hx of ETOH abuse/ cocaine use/marijuana use. 3. Psychiatric evaluation: pt did not have capacity 05/01/13. 4. Severe FE deficiency 5. Malnutrition albumin 1.9, total protein, 3.8./TNA 6. Tobacco use 7. Hypokalemia  8. Cocaine positive  drug screen 9. Multiple transfusions 10. SCD for DVT, will defer chemical prophylaxis to Medicine.  11.  Day 4 Unasyn   Plan:  IS, OOB, begin to mobilize, keep him on ice chips/NG decompression till we know GI tract is functional.  TNA for nutrition.    LOS: 6 days    Chou Busler 10/14/2014

## 2014-10-14 NOTE — Progress Notes (Signed)
ANTIBIOTIC CONSULT NOTE - Follow Up  Pharmacy Consult for Unasyn Indication: aspiration pneumonia  Allergies  Allergen Reactions  . Chocolate Nausea And Vomiting  . Peanut-Containing Drug Products Nausea And Vomiting  . Spinach Nausea And Vomiting    Patient Measurements: Height: 5\' 9"  (175.3 cm) Weight: 185 lb 6.5 oz (84.1 kg) IBW/kg (Calculated) : 70.7  Vital Signs: Temp: 98.5 F (36.9 C) (03/15 0800) Temp Source: Oral (03/15 0400) BP: 150/85 mmHg (03/15 0800) Intake/Output from previous day: 03/14 0701 - 03/15 0700 In: 2513 [I.V.:670; IV Piggyback:400; TPN:1443] Out: 3420 [Urine:2525; Emesis/NG output:375; Drains:520] Intake/Output from this shift:    Labs:  Recent Labs  10/12/14 0545  10/13/14 0500 10/13/14 0700 10/13/14 1350 10/13/14 2225 10/14/14 0410  WBC  --   < > 9.1 9.3 9.9 10.2  --   HGB  --   < > 7.8* 8.0* 8.2* 8.1*  --   PLT  --   < > 126* 131* 144* 165  --   CREATININE 0.97  --  0.73  --   --   --  0.80  < > = values in this interval not displayed. Estimated Creatinine Clearance: 105.6 mL/min (by C-G formula based on Cr of 0.8). No results for input(s): VANCOTROUGH, VANCOPEAK, VANCORANDOM, GENTTROUGH, GENTPEAK, GENTRANDOM, TOBRATROUGH, TOBRAPEAK, TOBRARND, AMIKACINPEAK, AMIKACINTROU, AMIKACIN in the last 72 hours.   Assessment 54yoM with with a history of ETOH abuse and recurrent UGIB due to ulcers who presented to ED 3/9 with melena and near-syncope. [facility] with same problem and left AMA 3/8 in am.  Remains on vent until bleeding resolved, but now noted with persistent BL interstitial infiltrates on CXR; PCCM concerned for RLL aspiration.  To begin Unasyn per pharmacy.    3/11 Cefoxitin periop >> 3/12 3/12 >> Unasyn >>   Temp: AF WBC: improved to WNL Renal: SCr 0.8; CrCl >100CG  3/9 MRSA screen: neg 3/12 trach aspirate: ngtd  Goal of Therapy:   Eradication of infection  Appropriate antibiotic dosing for  indication and renal function  Plan:   Continue Unasyn 3g IV q6hr.    Follow for de-escalation of antibiotics and LOT  Clance BollAmanda Gussie Towson, PharmD, BCPS Pager: [phone] 10/14/2014 11:34 AM

## 2014-10-14 NOTE — Progress Notes (Signed)
CSW reviewed chart and noted that PT recommending SNF placement upon discharge.   CSW noted that pt seen by psychiatrist and pt deemed to have capacity on 10/09/14. Psych CSW completed full psychosocial assessment on 10/09/14.  CSW met with pt at bedside. CSW introduced self and explained role.   CSW provided support to pt as he discussed that he is very sore following surgery and expressed that it is very difficult for him to move around. Pt states, " I just want time to heal more".   Pt reports that he lives at home with roommates. Pt discussed that he was admitted due to a GI bleed and required emergency surgery. Pt shared that he was recently at [facility] and reports that he kept being told he was going to be seen by a doctor and have surgery and reports that he "got tired of waiting" and left the hospital AMA. Pt reported that he returned to the hospital a day later after "passing out in the hallway". CSW provided support.   CSW discussed with pt that pt will likely need rehab at [facility] following hospitalization. Pt states, "I don't know about that, I heal fast". Pt reflected on previous experience when he was hospitalized and was able to return home. Pt hopeful that he will recover quickly in order to return back home. CSW reiterated the fact that pt may need additional assistance than what can be offered in his home environment. Pt agreeable for CSW to follow up to continue discussion disposition plans as pt progresses.   CSW to continue to follow.  Alison Murray, MSW, [place] [employer] [phone]

## 2014-10-14 NOTE — Progress Notes (Signed)
Peripherally Inserted Central Catheter/Midline Placement  The IV Nurse has discussed with the patient and/or persons authorized to consent for the patient, the purpose of this procedure and the potential benefits and risks involved with this procedure.  The benefits include less needle sticks, lab draws from the catheter and patient may be discharged home with the catheter.  Risks include, but not limited to, infection, bleeding, blood clot (thrombus formation), and puncture of an artery; nerve damage and irregular heat beat.  Alternatives to this procedure were also discussed.  PICC/Midline Placement Documentation  PICC Triple Lumen 10/14/14 PICC Left Basilic 44 cm 0 cm (Active)  Indication for Insertion or Continuance of Line Prolonged intravenous therapies 10/14/2014  5:51 PM  Exposed Catheter (cm) 0 cm 10/14/2014  5:51 PM  Dressing Change Due 10/21/14 10/14/2014  5:51 PM       [name], [name] 10/14/2014, 5:52 PM

## 2014-10-14 NOTE — Progress Notes (Signed)
PARENTERAL NUTRITION CONSULT NOTE - FOLLOW UP  Pharmacy Consult for TPN Indication: Massive GIB and needs bowel rest  Allergies  Allergen Reactions  . Chocolate Nausea And Vomiting  . Peanut-Containing Drug Products Nausea And Vomiting  . Spinach Nausea And Vomiting    Patient Measurements: Height:  (175.3 cm) Weight: 185 lb 6.5 oz (84.1 kg) IBW/kg (Calculated) : 70.7 Adjusted Body Weight: 76 kg  Vital Signs: Temp: 98.5 F (36.9 C) (03/15 0800) Temp Source: Oral (03/15 0400) BP: 150/85 mmHg (03/15 0800) Intake/Output from previous day: 03/14 0701 - 03/15 0700 In: 2513 [I.V.:670; IV Piggyback:400; TPN:1443] Out: 3420 [Urine:2525; Emesis/NG output:375; Drains:520] Intake/Output from this shift:    Labs:  Recent Labs  10/13/14 0700 10/13/14 1350 10/13/14 2225  WBC 9.3 9.9 10.2  HGB 8.0* 8.2* 8.1*  HCT 24.0* 24.6* 24.3*  PLT 131* 144* 165     Recent Labs  10/12/14 0545 10/13/14 0500 10/14/14 0410  NA 147* 145 141  K 3.5 3.7 3.6  CL 116* 112 106  CO2 28 29 32  GLUCOSE 107* 121* 151*  BUN CREATININE 0.97 0.73 0.80  CALCIUM 7.4* 7.3* 7.5*  MG  --  2.1 1.9  PHOS  --  3.6 3.9  PROT  --  3.9*  --   ALBUMIN  --  1.6*  --   AST  --  22  --   ALT  --  16  --   ALKPHOS  --  35*  --   BILITOT  --  0.6  --   TRIG  --  43  --    Estimated Creatinine Clearance: 105.6 mL/min (by C-G formula based on Cr of 0.8).    Recent Labs  10/13/14 1127 10/13/14 1738 10/13/14 2344  GLUCAP 112* 97 125*    Medications:  Scheduled:  . ampicillin-sulbactam (UNASYN) IV  3 g Intravenous Q6H  . antiseptic oral rinse  7 mL Mouth Rinse QID  . chlorhexidine  15 mL Mouth Rinse BID  . insulin aspart  0-9 Units Subcutaneous 4 times per day   Infusions:  . dextrose 5 % and 0.45% NaCl 10 mL/hr at 10/12/14 1027  . Marland KitchenTPN (CLINIMIX-E) Adult 83 mL/hr at 10/13/14 1822   And  . fat emulsion 240 mL (10/13/14 1822)  . pantoprozole (PROTONIX) infusion 8 mg/hr (10/14/14  0800)   Insulin Requirements:  3 units Novolog sensitive scale used in past 24 hours  Current Nutrition:  NPO, ice chips Clinimix E 5/15 at 83 ml/hr Fat emulsion 20% at 10 ml/hr  IVF: D545 at [facility] access: triple lumen CVC  TPN start date: 3/12  ASSESSMENT                                                                                                          HPI: [id number] YOM presents with melena and near-syncope.  Has h/o EtOH and cocaine abuse w/ previous UGIB d/t ulcers.  EGD 3/7 revealed large non-bleeding duodenal bulb ulcer.  3/9 IR angiogram did not identify bleeding  vessel.  Patient intubated on 3/10 and EGD performed w/o evidence of active bleed and hemoclip placed on visible vessel.  He was extubated 3/11 and developed abdominal pain and vomited BRB x 3.  Taken to OR for ex lap and oversewing of ulcer.  J-P drain was placed.  Orders to start TPN d/t massive GIB and need for bowel rest.   Significant events:  3/11: ex lap for GI Bleed with oversewing of ulcer 3/12: remains sedated on vent, propofol currently at 30 mcg/kg/min = 15kcal/hr = 360kcal/day if this rate continues 3/14: propofol discontinued 3/13. Will add lipids to TPN orders today.   Today: 10/14/2014   Glucose - at goal < 150  Electrolytes - WNL, calcium corrects to WNL     Renal - Scr WNL, adequate UOP  LFTs - WNL (note h/o EtOHism)   TGs - 67 (3/12), 43 (3/14)  Prealbumin - 10.9 (3/12)  NUTRITIONAL GOALS                                                                                             RD recs: Kcal: 1800-2000, Protein: 100-115 g Clinimix 5/15 at a goal rate of 9083ml/hr + 20% fat emulsion at 3210ml/hr to provide: 100g/day protein, 1894Kcal/day.  PLAN                                                                                                                          At 18:00 tonight:  Continue Clinimix E 5/15 to goal rate of 83 ml/hr  Continue lipids at 10 ml/hr  TPN to contain  standard MVI and trace elements daily  Continue CBC q4h sensitive sliding scale  IVF per MD  TPN labs every Monday and Thursday. BMET in AM.  F/u daily  Clance BollAmanda Sharna Gabrys, PharmD, BCPS Pager: [phone] 10/14/2014 11:23 AM

## 2014-10-15 DIAGNOSIS — I10 Essential (primary) hypertension: Secondary | ICD-10-CM

## 2014-10-15 DIAGNOSIS — K567 Ileus, unspecified: Secondary | ICD-10-CM

## 2014-10-15 DIAGNOSIS — K9189 Other postprocedural complications and disorders of digestive system: Secondary | ICD-10-CM

## 2014-10-15 LAB — BASIC METABOLIC PANEL
Anion gap: 5 (ref 5–15)
BUN: 12 mg/dL (ref 6–23)
CALCIUM: 7.6 mg/dL — AB (ref 8.4–10.5)
CHLORIDE: 103 mmol/L (ref 96–112)
CO2: 34 mmol/L — ABNORMAL HIGH (ref 19–32)
CREATININE: 0.76 mg/dL (ref 0.50–1.35)
GFR calc Af Amer: >90 mL/min (ref >90)
GFR calc non Af Amer: >90 mL/min (ref >90)
GLUCOSE: 113 mg/dL — AB (ref 70–99)
Potassium: 3.4 mmol/L — ABNORMAL LOW (ref 3.5–5.1)
Sodium: 142 mmol/L (ref 135–145)

## 2014-10-15 LAB — CBC
HCT: 23.7 % — ABNORMAL LOW (ref 39.0–52.0)
Hemoglobin: 8 g/dL — ABNORMAL LOW (ref 13.0–17.0)
MCH: 29 pg (ref 26.0–34.0)
MCHC: 33.8 g/dL (ref 30.0–36.0)
MCV: 85.9 fL (ref 78.0–100.0)
PLATELETS: 192 10*3/uL (ref 150–400)
RBC: 2.76 MIL/uL — ABNORMAL LOW (ref 4.22–5.81)
RDW: 13.1 % (ref 11.5–15.5)
WBC: 8.5 10*3/uL (ref 4.0–10.5)

## 2014-10-15 LAB — GLUCOSE, CAPILLARY
GLUCOSE-CAPILLARY: 105 mg/dL — AB (ref 70–99)
Glucose-Capillary: 103 mg/dL — ABNORMAL HIGH (ref 70–99)
Glucose-Capillary: 108 mg/dL — ABNORMAL HIGH (ref 70–99)
Glucose-Capillary: 112 mg/dL — ABNORMAL HIGH (ref 70–99)
Glucose-Capillary: 114 mg/dL — ABNORMAL HIGH (ref 70–99)

## 2014-10-15 MED ORDER — FAT EMULSION 20 % IV EMUL
240.0000 mL | INTRAVENOUS | Status: AC
  Administered 2014-10-15: 240 mL via INTRAVENOUS
  Filled 2014-10-15: qty 250

## 2014-10-15 MED ORDER — ACETAMINOPHEN 325 MG PO TABS: 650.0000 mg | ORAL_TABLET | ORAL | Status: DC | PRN

## 2014-10-15 MED ORDER — INSULIN ASPART 100 UNIT/ML ~~LOC~~ SOLN
0.0000 [IU] | Freq: Three times a day (TID) | SUBCUTANEOUS | Status: DC
  Administered 2014-10-17: 1 [IU] via SUBCUTANEOUS

## 2014-10-15 MED ORDER — TRACE MINERALS CR-CU-F-FE-I-MN-MO-SE-ZN IV SOLN
INTRAVENOUS | Status: AC
  Administered 2014-10-15: 17:00:00 via INTRAVENOUS
  Filled 2014-10-15: qty 1992

## 2014-10-15 MED ORDER — POTASSIUM CHLORIDE 10 MEQ/100ML IV SOLN
10.0000 meq | INTRAVENOUS | Status: AC
  Administered 2014-10-15 (×3): 10 meq via INTRAVENOUS
  Filled 2014-10-15 (×4): qty 100

## 2014-10-15 NOTE — Progress Notes (Signed)
[facility] PROGRESS NOTE  [name] ZOX:[id number] DOB: 1961/07/14 DOA: 10/08/2014 PCP: [name], MD  Assessment/Plan: 1. Upper GI bleed -Patient initially admitted for melena and near-syncope, undergoing EGD on 10/09/2014 that showed large ulcer seen in duodenal bulb with large visible vessel, Hemoclip placed at base of visible vessel -On 10/10/2014 patient having hematemesis associated with abdominal pain on 10/10/2014, becoming hypotensive. Patient was emergently transfused and taken to the OR. -On 10/10/2014 patient undergoing exploratory laparotomy with Gastrografin and oversewing of duodenal ulcer external over sewing of feeder vessels to be devascularized. -NG tube in place, continues to drain. Gen. surgery recommending mobilizing him and awaiting bowel function to return prior to initiating diet.  2.  Aspiration pneumonia. -Patient going into acute respiratory failure likely secondary to aspiration pneumonia -Last chest x-ray was performed on 10/13/2014 showing airspace opacities persist unchanged. -He remains on Unasyn 3 g IV every 6 hours, day 5  3.  Acute blood loss anemia -Secondary upper GI bleed, patient was transfused a total of 8 units of packed red blood cells and 2 units of fresh frozen plasma -Lab showing stable hemoglobin of 8.0.  4.  Hypokalemia. -A.m. lab work showed potassium 3.4 -He was given 40 mg of IV potassium  5.  Nutrition. -Patient on TPN  6.  History of polysubstance abuse -Counseled   Code Status: Full code Family Communication:  Disposition Plan:    Consultants:  GI  Gen. Surgery  Pulmonary critical care medicine  SIGNIFICANT EVENTS: 3/7 endoscopy with visible vessel, high risk for intervention 3/9 IR embolization could not identify a vessel 3/10 overnight passed bright red blood per rectum,  3/10: EGD. Endo-clipping of visible vessel w/in duodenal ulcer.  3/11 extubated. hgb drifted 1 gm over night.  3/11  Exploratory laparotomy, over sewing of duodenal ulcer and external over sewing of feeder vessels   Antibiotics:  Unasyn  HPI/Subjective: Patient reporting feeling hungry, and wanting NG tube to be taken out  Objective: Filed Vitals:   10/15/14 0623  BP: 139/72  Pulse: 66  Temp: 98.2 F (36.8 C)  Resp: 20    Intake/Output Summary (Last 24 hours) at 10/15/14 1442 Last data filed at 10/15/14 1216  Gross per 24 hour  Intake 2121.83 ml  Output   4590 ml  Net -2468.17 ml   Filed Weights   10/14/14 0400 10/14/14 2027 10/15/14 0623  Weight: 84.1 kg (185 lb 6.5 oz) 78.1 kg (172 lb 2.9 oz) 78.1 kg (172 lb 2.9 oz)    Exam:   General: Sitting up in recliner chair comfortably. Right IJ line. NGT in place.  Cardiovascular: S1-S2 normal. No murmurs. Pulse regular.  Respiratory: Good air entry bilaterally. No rhonchi or rales.  Abdomen: Surgical wound with clean dressing. Tender to palpation..  Musculoskeletal: No pedal edema   Neurological: Intact  Data Reviewed: Basic Metabolic Panel:  Recent Labs Lab 10/10/14 1720 10/11/14 0510 10/12/14 0545 10/13/14 0500 10/14/14 0410 10/15/14 0300  NA 142 144 147* 145 141 142  K 4.2 3.7 3.5 3.7 3.6 3.4*  CL 119* 118* 116* 112 106 103  CO2 32 34*  GLUCOSE 168* 131* 107* 121* 151* 113*  BUN 24* CREATININE 1.14 1.05 0.97 0.73 0.80 0.76  CALCIUM 7.0* 7.1* 7.4* 7.3* 7.5* 7.6*  MG 1.5 2.2  --  2.1 1.9  --   PHOS 4.4 3.9  --  3.6 3.9  --    Liver Function Tests:  Recent Labs  Lab 10/11/14 0510 10/13/14 0500  AST 33 22  ALT 26 16  ALKPHOS 31* 35*  BILITOT 0.6 0.6  PROT 3.4* 3.9*  ALBUMIN 1.7* 1.6*   No results for input(s): LIPASE, AMYLASE in the last 168 hours. No results for input(s): AMMONIA in the last 168 hours. CBC:  Recent Labs Lab 10/11/14 0510  10/13/14 0500 10/13/14 0700 10/13/14 1350 10/13/14 2225 10/15/14 0300  WBC 12.5*  < > 9.1 9.3 9.9 10.2 8.5  NEUTROABS 10.8*  --   7.7  --   --   --   --   HGB 9.2*  < > 7.8* 8.0* 8.2* 8.1* 8.0*  HCT 26.6*  < > 23.6* 24.0* 24.6* 24.3* 23.7*  MCV 86.9  < > 89.1 88.9 88.2 87.4 85.9  PLT 39*  < > 126* 131* 144* 165 192  < > = values in this interval not displayed. Cardiac Enzymes: No results for input(s): CKTOTAL, CKMB, CKMBINDEX, TROPONINI in the last 168 hours. BNP (last 3 results) No results for input(s): BNP in the last 8760 hours.  ProBNP (last 3 results) No results for input(s): PROBNP in the last 8760 hours.  CBG:  Recent Labs Lab 10/14/14 1201 10/14/14 1639 10/15/14 0019 10/15/14 0620 10/15/14 1212  GLUCAP 110* 102* 112* 114* 105*    Recent Results (from the past 240 hour(s))  MRSA PCR Screening     Status: None   Collection Time: 10/05/14 11:53 PM  Result Value Ref Range Status   MRSA by PCR NEGATIVE NEGATIVE Final    Comment:        The GeneXpert MRSA Assay (FDA approved for NASAL specimens only), is one component of a comprehensive MRSA colonization surveillance program. It is not intended to diagnose MRSA infection nor to guide or monitor treatment for MRSA infections.   MRSA PCR Screening     Status: None   Collection Time: 10/08/14  2:02 PM  Result Value Ref Range Status   MRSA by PCR NEGATIVE NEGATIVE Final    Comment:        The GeneXpert MRSA Assay (FDA approved for NASAL specimens only), is one component of a comprehensive MRSA colonization surveillance program. It is not intended to diagnose MRSA infection nor to guide or monitor treatment for MRSA infections. Performed at [facility]   Culture, respiratory (NON-Expectorated)     Status: None   Collection Time: 10/11/14 10:45 AM  Result Value Ref Range Status   Specimen Description TRACHEAL ASPIRATE  Final   Special Requests NONE  Final   Gram Stain   Final    ABUNDANT WBC PRESENT, PREDOMINANTLY PMN NO SQUAMOUS EPITHELIAL CELLS SEEN NO ORGANISMS SEEN Performed at [facility]    Culture   Final     Non-Pathogenic Oropharyngeal-type Flora Isolated. Performed at [facility]    Report Status 10/14/2014 FINAL  Final     Studies: No results found.  Scheduled Meds: . ampicillin-sulbactam (UNASYN) IV  3 g Intravenous Q6H  . antiseptic oral rinse  7 mL Mouth Rinse QID  . chlorhexidine  15 mL Mouth Rinse BID  . insulin aspart  0-9 Units Subcutaneous 3 times per day  . pantoprazole (PROTONIX) IV  40 mg Intravenous Q12H  . potassium chloride  10 mEq Intravenous Q1 Hr x 4  . sodium chloride  10-40 mL Intracatheter Q12H   Continuous Infusions: . dextrose 5 % and 0.45% NaCl 1,000 mL (10/14/14 1759)  . Marland Kitchen.TPN (CLINIMIX-E) Adult 83 mL/hr at 10/14/14  1801   And  . fat emulsion 240 mL (10/14/14 1801)  . Marland KitchenTPN (CLINIMIX-E) Adult     And  . fat emulsion      Active Problems:   Hypertension   Status post laparotomy with oversew of bleeding duodenal artery   Duodenal ulcer   UGIB (upper gastrointestinal bleed)   Acute post-hemorrhagic anemia   Duodenal ulcer hemorrhage   Polysubstance abuse   Acute blood loss anemia   Aspiration pneumonia   Ileus, postoperative    Time spent: 35 min    Jeralyn Bennett  [facility] Pager [phone]. If 7PM-7AM, please contact night-coverage at [email or web address], password [facility] 10/15/2014, 2:42 PM  LOS: 7 days

## 2014-10-15 NOTE — Progress Notes (Signed)
PARENTERAL NUTRITION CONSULT NOTE - FOLLOW UP  Pharmacy Consult for TPN Indication: Massive GIB and needs bowel rest  Allergies  Allergen Reactions  . Chocolate Nausea And Vomiting  . Peanut-Containing Drug Products Nausea And Vomiting  . Spinach Nausea And Vomiting    Patient Measurements: Height: 5\' 9"  (175.3 cm) Weight: 172 lb 2.9 oz (78.1 kg) IBW/kg (Calculated) : 70.7 Adjusted Body Weight: 76 kg  Vital Signs: Temp: 98.2 F (36.8 C) (03/16 0623) Temp Source: Oral (03/16 46960623) BP: 139/72 mmHg (03/16 0623) Pulse Rate: 66 (03/16 0623) Intake/Output from previous day: 03/15 0701 - 03/16 0700 In: 3117.8 [I.V.:485.8; IV Piggyback:400; EXB:2841]TPN:2232] Out: 5115 [Urine:3725; Emesis/NG output:1150; Drains:240] Intake/Output from this shift:    Labs:  Recent Labs  10/13/14 1350 10/13/14 2225 10/15/14 0300  WBC 9.9 10.2 8.5  HGB 8.2* 8.1* 8.0*  HCT 24.6* 24.3* 23.7*  PLT 144* 165 192     Recent Labs  10/13/14 0500 10/14/14 0410 10/15/14 0300  NA 145 141 142  K 3.7 3.6 3.4*  CL 112 106 103  CO2 29 32 34*  GLUCOSE 121* 151* 113*  BUN 11 11 12   CREATININE 0.73 0.80 0.76  CALCIUM 7.3* 7.5* 7.6*  MG 2.1 1.9  --   PHOS 3.6 3.9  --   PROT 3.9*  --   --   ALBUMIN 1.6*  --   --   AST 22  --   --   ALT 16  --   --   ALKPHOS 35*  --   --   BILITOT 0.6  --   --   TRIG 43  --   --    Estimated Creatinine Clearance: 105.6 mL/min (by C-G formula based on Cr of 0.76).    Recent Labs  10/14/14 1639 10/15/14 0019 10/15/14 0620  GLUCAP 102* 112* 114*    Medications:  Scheduled:  . ampicillin-sulbactam (UNASYN) IV  3 g Intravenous Q6H  . antiseptic oral rinse  7 mL Mouth Rinse QID  . chlorhexidine  15 mL Mouth Rinse BID  . insulin aspart  0-9 Units Subcutaneous 4 times per day  . pantoprazole (PROTONIX) IV  40 mg Intravenous Q12H  . sodium chloride  10-40 mL Intracatheter Q12H   Infusions:  . dextrose 5 % and 0.45% NaCl 1,000 mL (10/14/14 1759)  . Marland Kitchen.TPN  (CLINIMIX-E) Adult 83 mL/hr at 10/14/14 1801   And  . fat emulsion 240 mL (10/14/14 1801)   Insulin Requirements:  0 units Novolog sensitive scale used in past 24 hours  Current Nutrition:  NPO, ice chips Clinimix E 5/15 at 83 ml/hr Fat emulsion 20% at 10 ml/hr  [facility] D[facility]  Central access: PICC placed 3/15.  TPN start date: 3/12  ASSESSMENT                                                                                                          HPI: [id number] YOM presents with melena and near-syncope.  Has h/o EtOH and cocaine abuse w/ previous UGIB d/t ulcers.  EGD 3/7  revealed large non-bleeding duodenal bulb ulcer.  3/9 IR angiogram did not identify bleeding vessel.  Patient intubated on 3/10 and EGD performed w/o evidence of active bleed and hemoclip placed on visible vessel.  He was extubated 3/11 and developed abdominal pain and vomited BRB x 3.  Taken to OR for ex lap and oversewing of ulcer.  J-P drain was placed.  Orders to start TPN d/t massive GIB and need for bowel rest.   Significant events:  3/11: ex lap for GI Bleed with oversewing of ulcer 3/12: remains sedated on vent, propofol currently at 30 mcg/kg/min = 15kcal/hr = 360kcal/day if this rate continues 3/14: propofol discontinued 3/13. Will add lipids to TPN orders today. 3/15: tx to floor. Started lipids.   Today: 10/15/2014  Glucose - at goal < 150  Electrolytes - K trended down. Calcium corrects to WNL     Renal - Scr WNL, adequate UOP  LFTs - WNL (note h/o EtOHism)   TGs - 67 (3/12), 43 (3/14)  Prealbumin - 10.9 (3/12)  NUTRITIONAL GOALS                                                                                             RD recs: Kcal: 1800-2000, Protein: 100-115 g Clinimix 5/15 at a goal rate of 26ml/hr + 20% fat emulsion at 1ml/hr to provide: 100g/day protein, 1894Kcal/day.  PLAN                                                                                                                           At 18:00 tonight:  Continue Clinimix E 5/15 to goal rate of 83 ml/hr  Continue lipids at 10 ml/hr  TPN to contain standard MVI and trace elements daily  Continue CBG q8h sensitive sliding scale  IVF per MD  TPN labs every Monday and Thursday. BMET in AM.  F/u daily  Charolotte Eke, PharmD, pager [phone]. 10/15/2014,7:39 AM.

## 2014-10-15 NOTE — Progress Notes (Signed)
5 Days Post-Op  Subjective: He says he's miserable.  Wants to know when he can eat.  He says he did walk some yesterday.  His wound looks fine.    Objective: Vital signs in last 24 hours: Temp:  [97.7 F (36.5 C)-99.3 F (37.4 C)] 98.2 F (36.8 C) (03/16 0623) Pulse Rate:  [66-80] 66 (03/16 0623) Resp:  [14-23] 20 (03/16 0623) BP: (139-143)/(72-93) 139/72 mmHg (03/16 0623) SpO2:  [100 %] 100 % (03/16 0623) Weight:  [78.1 kg (172 lb 2.9 oz)] 78.1 kg (172 lb 2.9 oz) (03/16 24400623) Last BM Date: 10/09/14 Nothing PO recorded NPO except ice chips 1150 from NG yesterday 240 from the drain. Afebrile, VSS K+ 3.4, H/H stable, WBC 8.5 No films. Intake/Output from previous day: 03/15 0701 - 03/16 0700 In: 3117.8 [I.V.:485.8; IV Piggyback:400; TPN:2232] Out: 5115 [Urine:3725; Emesis/NG output:1150; Drains:240] Intake/Output this shift:    General appearance: alert, cooperative and no distress Resp: clear to auscultation bilaterally GI: soft, few bowel sounds, no flatus/no BM.  wound looks good.  Lab Results:   Recent Labs  10/13/14 2225 10/15/14 0300  WBC 10.2 8.5  HGB 8.1* 8.0*  HCT 24.3* 23.7*  PLT 165 192    BMET  Recent Labs  10/14/14 0410 10/15/14 0300  NA 141 142  K 3.6 3.4*  CL 106 103  CO2 32 34*  GLUCOSE 151* 113*  BUN 11 12  CREATININE 0.80 0.76  CALCIUM 7.5* 7.6*   PT/INR No results for input(s): LABPROT, INR in the last 72 hours.   Recent Labs Lab 10/11/14 0510 10/13/14 0500  AST 33 22  ALT 26 16  ALKPHOS 31* 35*  BILITOT 0.6 0.6  PROT 3.4* 3.9*  ALBUMIN 1.7* 1.6*     Lipase     Component Value Date/Time   LIPASE 42 05/27/2013 0900     Studies/Results: No results found.  Medications: . ampicillin-sulbactam (UNASYN) IV  3 g Intravenous Q6H  . antiseptic oral rinse  7 mL Mouth Rinse QID  . chlorhexidine  15 mL Mouth Rinse BID  . insulin aspart  0-9 Units Subcutaneous 3 times per day  . pantoprazole (PROTONIX) IV  40 mg Intravenous  Q12H  . sodium chloride  10-40 mL Intracatheter Q12H   . dextrose 5 % and 0.45% NaCl 1,000 mL (10/14/14 1759)  . Marland Kitchen.TPN (CLINIMIX-E) Adult 83 mL/hr at 10/14/14 1801   And  . fat emulsion 240 mL (10/14/14 1801)  . Marland Kitchen.TPN (CLINIMIX-E) Adult     And  . fat emulsion      Assessment/Plan 1. Recurrent duodenal bleed; s/p oversewing of duodenal ulcer 08/10/12, and embolization of the gastroduodenal artery 05/03/13. 1A. Exploratory laparotomy, gastrorrhaphy, over sewing of duodenal ulcer and external over sewing of feeder vessels to be devascularized, 10/10/2014, Thornton ParkMatthew B. Daphine DeutscherMartin, MD. 1B. Upper endoscopy with control of bleeding,Endo clip application, 10/09/14, Dr. Rhea BeltonPyrtle 2. Hx of ETOH abuse/ cocaine use/marijuana use. 3. Psychiatric evaluation: pt did not have capacity 05/01/13. 4. Severe FE deficiency 5. Malnutrition albumin 1.9, total protein, 3.8./TNA 6. Tobacco use 7. Hypokalemia  8. Cocaine positive drug screen  9. Multiple transfusions 10. SCD for DVT, will defer chemical prophylaxis to Medicine. 11. Day 5 Unasyn 12.  Post op ileus 13.  Hypokalemia   Plan:  NG drainage is the largest he has had so far.  Drain is clear serous colored fluid.  I would continue to mobilize him and wait on bowel function to return so we start feeding him.  Replace K+    LOS: 7 days    Melessa Cowell 10/15/2014

## 2014-10-16 DIAGNOSIS — K269 Duodenal ulcer, unspecified as acute or chronic, without hemorrhage or perforation: Secondary | ICD-10-CM

## 2014-10-16 LAB — CBC
HEMATOCRIT: 24.1 % — AB (ref 39.0–52.0)
HEMOGLOBIN: 8.1 g/dL — AB (ref 13.0–17.0)
MCH: 28.9 pg (ref 26.0–34.0)
MCHC: 33.6 g/dL (ref 30.0–36.0)
MCV: 86.1 fL (ref 78.0–100.0)
Platelets: 263 10*3/uL (ref 150–400)
RBC: 2.8 MIL/uL — AB (ref 4.22–5.81)
RDW: 13.1 % (ref 11.5–15.5)
WBC: 9.3 10*3/uL (ref 4.0–10.5)

## 2014-10-16 LAB — PHOSPHORUS: Phosphorus: 4 mg/dL (ref 2.3–4.6)

## 2014-10-16 LAB — COMPREHENSIVE METABOLIC PANEL
ALK PHOS: 74 U/L (ref 39–117)
ALT: 22 U/L (ref 0–53)
AST: 32 U/L (ref 0–37)
Albumin: 1.6 g/dL — ABNORMAL LOW (ref 3.5–5.2)
Anion gap: 4 — ABNORMAL LOW (ref 5–15)
BILIRUBIN TOTAL: 0.7 mg/dL (ref 0.3–1.2)
BUN: 14 mg/dL (ref 6–23)
CALCIUM: 7.5 mg/dL — AB (ref 8.4–10.5)
CO2: 33 mmol/L — ABNORMAL HIGH (ref 19–32)
CREATININE: 0.86 mg/dL (ref 0.50–1.35)
Chloride: 103 mmol/L (ref 96–112)
GFR calc non Af Amer: >90 mL/min (ref >90)
Glucose, Bld: 113 mg/dL — ABNORMAL HIGH (ref 70–99)
POTASSIUM: 3.9 mmol/L (ref 3.5–5.1)
SODIUM: 140 mmol/L (ref 135–145)
Total Protein: 4.4 g/dL — ABNORMAL LOW (ref 6.0–8.3)

## 2014-10-16 LAB — GLUCOSE, CAPILLARY
GLUCOSE-CAPILLARY: 114 mg/dL — AB (ref 70–99)
Glucose-Capillary: 113 mg/dL — ABNORMAL HIGH (ref 70–99)
Glucose-Capillary: 96 mg/dL (ref 70–99)

## 2014-10-16 LAB — MAGNESIUM: Magnesium: 2.1 mg/dL (ref 1.5–2.5)

## 2014-10-16 MED ORDER — FAT EMULSION 20 % IV EMUL
240.0000 mL | INTRAVENOUS | Status: AC
  Administered 2014-10-16: 240 mL via INTRAVENOUS
  Filled 2014-10-16: qty 250

## 2014-10-16 MED ORDER — TRACE MINERALS CR-CU-F-FE-I-MN-MO-SE-ZN IV SOLN
INTRAVENOUS | Status: AC
  Administered 2014-10-16: 18:00:00 via INTRAVENOUS
  Filled 2014-10-16: qty 1992

## 2014-10-16 MED ORDER — MORPHINE SULFATE 2 MG/ML IJ SOLN
1.0000 mg | INTRAMUSCULAR | Status: DC | PRN
  Administered 2014-10-16 – 2014-10-17 (×6): 1 mg via INTRAVENOUS
  Filled 2014-10-16 (×6): qty 1

## 2014-10-16 NOTE — Progress Notes (Signed)
Physical Therapy Treatment Patient Details Name: Einar GradRandy Gaida MRN: [id number] DOB: 01/17/1961 Today's Date: 10/16/2014    History of Present Illness 54 yo male s/p exp lap, gastrorrhaphy 10/10/14. Extubated 10/12/14. Hx of polysubstance abuse, anemia, HTN, recurrent UGIB with ulcers.     PT Comments    Progressing slowly with mobility. Pt much more pleasant and cooperative this session. Min encouragement for participation however pt reported he walked earlier with nursing assistance. Pt feels he walked too far so I encouraged him to continue multiple walks buy maybe shorter distances initially. Also encouraged him to progress activity as able/tolerated. [facility]  Follow Up Recommendations  SNF;Supervision/Assistance - 24 hour     Equipment Recommendations  Rolling walker with 5" wheels    Recommendations for Other Services OT consult     Precautions / Restrictions Precautions Precautions: Fall Precaution Comments: drain, abdominal surgery Restrictions Weight Bearing Restrictions: No    Mobility  Bed Mobility Overal bed mobility: Needs Assistance Bed Mobility: Supine to Sit;Sit to Supine     Supine to sit: Mod assist;HOB elevated Sit to supine: Mod assist;HOB elevated   General bed mobility comments: Assist for bil LEs and trunk to upright.   Transfers Overall transfer level: Needs assistance Equipment used: Rolling walker (2 wheeled) Transfers: Sit to/from Stand Sit to Stand: From elevated surface;Min assist         General transfer comment: Assist to rise, stabilize, control descent. VCs safety, technique, hand placement.   Ambulation/Gait Ambulation/Gait assistance: Min assist;+2 safety/equipment Ambulation Distance (Feet): 100 Feet Assistive device: Rolling walker (2 wheeled) Gait Pattern/deviations: Step-through pattern;Trunk flexed;Decreased stride length     General Gait Details: slow gait speed. dyspnea 3/4. REmained on [place] O2  during ambulation. Assist to stabilize and manage IV/lines. Fatigues easily.    Stairs            Wheelchair Mobility    Modified Rankin (Stroke Patients Only)       Balance           Standing balance support: Bilateral upper extremity supported;During functional activity Standing balance-Leahy Scale: Poor                      Cognition Arousal/Alertness: Awake/alert Behavior During Therapy: WFL for tasks assessed/performed Overall Cognitive Status: Within Functional Limits for tasks assessed                      Exercises      General Comments        Pertinent Vitals/Pain Pain Assessment: Faces Faces Pain Scale: Hurts even more Pain Location: abdomen Pain Descriptors / Indicators: Aching;Sore Pain Intervention(s): Monitored during session;Limited activity within patient's tolerance;Repositioned    Home Living                      Prior Function            PT Goals (current goals can now be found in the care plan section) Progress towards PT goals: Progressing toward goals (slowly)    Frequency  Min 3X/week    PT Plan Current plan remains appropriate    Co-evaluation             End of Session Equipment Utilized During Treatment: Oxygen Activity Tolerance: Patient limited by fatigue;Patient limited by pain Patient left: in bed;with call bell/phone within reach     Time: 1344-1400 PT Time Calculation (min) (ACUTE ONLY): 16 min  Charges:  $Gait Training: 8-22 mins                    G Codes:      Weston Anna, MPT Pager: [phone]

## 2014-10-16 NOTE — Progress Notes (Signed)
6 Days Post-Op  Subjective: No C/O.  Wants NG out.  No flatus or BM yet  Objective: Vital signs in last 24 hours: Temp:  [98 F (36.7 C)-100.5 F (38.1 C)] 98 F (36.7 C) (03/17 0800) Pulse Rate:  [73-78] 78 (03/17 0800) Resp:  [18-20] 18 (03/17 0800) BP: (135-156)/(69-95) 136/78 mmHg (03/17 0800) SpO2:  [96 %-100 %] 97 % (03/17 0800) Weight:  [77.5 kg (170 lb 13.7 oz)] 77.5 kg (170 lb 13.7 oz) (03/17 0500) Last BM Date: 10/05/14  Intake/Output from previous day: 03/16 0701 - 03/17 0700 In: 10 [I.V.:10] Out: 3840 [Urine:2700; Emesis/NG output:1100; Drains:40] Intake/Output this shift: Total I/O In: -  Out: 300 [Urine:300]  General appearance: alert, cooperative and no distress GI: normal findings: soft, non-tender and non distended Incision/Wound: Dressing clean and dry.  JP drainage serous  Lab Results:   Recent Labs  10/15/14 0300 10/16/14 0329  WBC 8.5 9.3  HGB 8.0* 8.1*  HCT 23.7* 24.1*  PLT 192 263   BMET  Recent Labs  10/15/14 0300 10/16/14 0329  NA 142 140  K 3.4* 3.9  CL 103 103  CO2 34* 33*  GLUCOSE 113* 113*  BUN 12 14  CREATININE 0.76 0.86  CALCIUM 7.6* 7.5*   PT/INR No results for input(s): LABPROT, INR in the last 72 hours. ABG No results for input(s): PHART, HCO3 in the last 72 hours.  Invalid input(s): PCO2, PO2  Studies/Results: No results found.  Anti-infectives: Anti-infectives    Start     Dose/Rate Route Frequency Ordered Stop   10/11/14 1200  Ampicillin-Sulbactam (UNASYN) 3 g in sodium chloride 0.9 % 100 mL IVPB     3 g 100 mL/hr over 60 Minutes Intravenous Every 6 hours 10/11/14 1031     10/10/14 2200  cefOXitin (MEFOXIN) 2 g in dextrose 5 % 50 mL IVPB  Status:  Discontinued     2 g 100 mL/hr over 30 Minutes Intravenous 3 times per day 10/10/14 1601 10/11/14 1030      Assessment/Plan: s/p Procedure(s): EXPLORATORY LAPAROTOMY WITH GASTROPATHY AND OVER SEWEN DUODENAL ULCER REGION  (N/A)  Doing well without apparent  complication.  NG output still somewhat high but abdomen soft and nerly 1 week post op D/C NGT, NPO for now  LOS: 8 days    [name] T 10/16/2014

## 2014-10-16 NOTE — Progress Notes (Signed)
PARENTERAL NUTRITION CONSULT NOTE - FOLLOW UP  Pharmacy Consult for TPN Indication: Massive GIB and needs bowel rest  Allergies  Allergen Reactions  . Chocolate Nausea And Vomiting  . Peanut-Containing Drug Products Nausea And Vomiting  . Spinach Nausea And Vomiting    Patient Measurements: Height:  (175.3 cm) Weight: 170 lb 13.7 oz (77.5 kg) IBW/kg (Calculated) : 70.7 Adjusted Body Weight: 76 kg  Vital Signs: Temp: 98 F (36.7 C) (03/17 0800) Temp Source: Oral (03/17 0800) BP: 136/78 mmHg (03/17 0800) Pulse Rate: 78 (03/17 0800) Intake/Output from previous day: 03/16 0701 - 03/17 0700 In: 10 [I.V.:10] Out: 3840 [Urine:2700; Emesis/NG output:1100; Drains:40] Intake/Output from this shift: Total I/O In: -  Out: 300 [Urine:300]  Labs:  Recent Labs  10/13/14 2225 10/15/14 0300 10/16/14 0329  WBC 10.2 8.5 9.3  HGB 8.1* 8.0* 8.1*  HCT 24.3* 23.7* 24.1*  PLT 165 192 263     Recent Labs  10/14/14 0410 10/15/14 0300 10/16/14 0329  NA 141 142 140  K 3.6 3.4* 3.9  CL 106 103 103  CO2 32 34* 33*  GLUCOSE 151* 113* 113*  BUN CREATININE 0.80 0.76 0.86  CALCIUM 7.5* 7.6* 7.5*  MG 1.9  --  2.1  PHOS 3.9  --  4.0  PROT  --   --  4.4*  ALBUMIN  --   --  1.6*  AST  --   --  32  ALT  --   --  22  ALKPHOS  --   --  74  BILITOT  --   --  0.7   Estimated Creatinine Clearance: 98.2 mL/min (by C-G formula based on Cr of 0.86).    Recent Labs  10/15/14 1813 10/15/14 2241 10/16/14 0511  GLUCAP 103* 108* 114*    Medications:  Scheduled:  . antiseptic oral rinse  7 mL Mouth Rinse QID  . chlorhexidine  15 mL Mouth Rinse BID  . insulin aspart  0-9 Units Subcutaneous 3 times per day  . pantoprazole (PROTONIX) IV  40 mg Intravenous Q12H  . sodium chloride  10-40 mL Intracatheter Q12H   Infusions:  . dextrose 5 % and 0.45% NaCl 1,000 mL (10/14/14 1759)  . Marland KitchenTPN (CLINIMIX-E) Adult 83 mL/hr at 10/15/14 1727   And  . fat emulsion 240 mL (10/15/14  1727)   Insulin Requirements:  0 units Novolog sensitive scale used in past 24 hours  Current Nutrition:  NPO, ice chips Clinimix E 5/15 at 83 ml/hr Fat emulsion 20% at 10 ml/hr  IVF: D5 1/2NS at [facility] access: PICC placed 3/15.  TPN start date: 3/12  ASSESSMENT                                                                                                          HPI: 75 YOM presents with melena and near-syncope.  Has h/o EtOH and cocaine abuse w/ previous UGIB d/t ulcers.  EGD 3/7 revealed large non-bleeding duodenal bulb ulcer.  3/9 IR angiogram did not identify bleeding vessel.  Patient intubated on 3/10 and EGD performed w/o evidence of active bleed and hemoclip placed on visible vessel.  He was extubated 3/11 and developed abdominal pain and vomited BRB x 3.  Taken to OR for ex lap and oversewing of ulcer.  J-P drain was placed.  Orders to start TPN d/t massive GIB and need for bowel rest.   Significant events:  3/11: ex lap for GI Bleed with oversewing of ulcer 3/12: remains sedated on vent, propofol currently at 30 mcg/kg/min = 15kcal/hr = 360kcal/day if this rate continues 3/14: propofol discontinued 3/13. Will add lipids to TPN orders today. 3/15: tx to floor. Started lipids. 3/17: no flatus or BM yet, tolerating goal TPN rate   Today: 10/16/2014  Glucose - at goal < 150  Electrolytes - K improved. Calcium corrects to WNL     Renal - Scr WNL, adequate UOP  LFTs - WNL (note EtOH hx), alb low  TGs - 67 (3/12), 43 (3/14)  Prealbumin - 10.9 (3/12)  NUTRITIONAL GOALS                                                                                             RD recs: Kcal: 1800-2000, Protein: 100-115 g Clinimix 5/15 at a goal rate of 5583ml/hr + 20% fat emulsion at 2110ml/hr to provide: 100g/day protein, 1894Kcal/day.  PLAN                                                                                                                          At 18:00  tonight:  Continue Clinimix E 5/15 to goal rate of 83 ml/hr  Continue lipids at 10 ml/hr  TPN to contain standard MVI and trace elements daily  Continue CBG q8h sensitive sliding scale  IVF per MD  TPN labs every Monday and Thursday. BMET in AM.  F/u daily  Bernadene Personrew Gerod Caligiuri, PharmD Pager: [phone] 10/16/2014, 10:33 AM

## 2014-10-16 NOTE — Progress Notes (Signed)
[facility] PROGRESS NOTE  [name] ZOX:[id number] DOB: 07-Jun-1961 DOA: 10/08/2014 PCP: [name], MD  Assessment/Plan: 1. Upper GI bleed -Patient initially admitted for melena and near-syncope, undergoing EGD on 10/09/2014 that showed large ulcer seen in duodenal bulb with large visible vessel, Hemoclip placed at base of visible vessel -On 10/10/2014 patient having hematemesis associated with abdominal pain on 10/10/2014, becoming hypotensive. Patient was emergently transfused and taken to the OR. -On 10/10/2014 patient undergoing exploratory laparotomy with Gastrografin and oversewing of duodenal ulcer external over sewing of feeder vessels to be devascularized. -NG tube removed on 10/16/2014, patient reporting having some abdominal pain however, denies N/V. Still has not had a bowel movement.   2.  Aspiration pneumonia. -Patient going into acute respiratory failure likely secondary to aspiration pneumonia -Last chest x-ray was performed on 10/13/2014 showing airspace opacities persist unchanged. -He remains on Unasyn 3 g IV every 6 hours, day 6  -Anticipate stopping IV antibiotic therapy in am.   3.  Acute blood loss anemia -Secondary upper GI bleed, patient was transfused a total of 8 units of packed red blood cells and 2 units of fresh frozen plasma -Lab showing stable hemoglobin of 8.1.  4.  Hypokalemia. -A.m. lab work showed potassium of 3.9 after the administration of potassium yesterday  5.  Nutrition. -Patient on TPN  6.  History of polysubstance abuse -Counseled   Code Status: Full code Family Communication:  Disposition Plan:    Consultants:  GI  Gen. Surgery  Pulmonary critical care medicine  SIGNIFICANT EVENTS: 3/7 endoscopy with visible vessel, high risk for intervention 3/9 IR embolization could not identify a vessel 3/10 overnight passed bright red blood per rectum,  3/10: EGD. Endo-clipping of visible vessel w/in duodenal ulcer.   3/11 extubated. hgb drifted 1 gm over night.  3/11 Exploratory laparotomy, over sewing of duodenal ulcer and external over sewing of feeder vessels   Antibiotics:  Unasyn  HPI/Subjective: Patient reporting some abdominal pain, denies N/V. NG tube was removed today.   Objective: Filed Vitals:   10/16/14 1415  BP: 136/69  Pulse: 82  Temp: 97.8 F (36.6 C)  Resp: 32    Intake/Output Summary (Last 24 hours) at 10/16/14 1650 Last data filed at 10/16/14 1644  Gross per 24 hour  Intake     10 ml  Output   3805 ml  Net  -3795 ml   Filed Weights   10/14/14 2027 10/15/14 0623 10/16/14 0500  Weight: 78.1 kg (172 lb 2.9 oz) 78.1 kg (172 lb 2.9 oz) 77.5 kg (170 lb 13.7 oz)    Exam:   General: Sitting up in recliner chair comfortably. Right IJ line. NGT in place.  Cardiovascular: S1-S2 normal. No murmurs. Pulse regular.  Respiratory: Good air entry bilaterally. No rhonchi or rales.  Abdomen: Surgical wound with clean dressing. Tender to palpation..  Musculoskeletal: No pedal edema   Neurological: Intact  Data Reviewed: Basic Metabolic Panel:  Recent Labs Lab 10/10/14 1720 10/11/14 0510 10/12/14 0545 10/13/14 0500 10/14/14 0410 10/15/14 0300 10/16/14 0329  NA 142 144 147* 145 141 142 140  K 4.2 3.7 3.5 3.7 3.6 3.4* 3.9  CL 119* 118* 116* 112 106 103 103  CO2 32 34* 33*  GLUCOSE 168* 131* 107* 121* 151* 113* 113*  BUN 24* CREATININE 1.14 1.05 0.97 0.73 0.80 0.76 0.86  CALCIUM 7.0* 7.1* 7.4* 7.3* 7.5* 7.6* 7.5*  MG 1.5 2.2  --  2.1  1.9  --  2.1  PHOS 4.4 3.9  --  3.6 3.9  --  4.0   Liver Function Tests:  Recent Labs Lab 10/11/14 0510 10/13/14 0500 10/16/14 0329  AST 33 22 32  ALT 26 16 22   ALKPHOS 31* 35* 74  BILITOT 0.6 0.6 0.7  PROT 3.4* 3.9* 4.4*  ALBUMIN 1.7* 1.6* 1.6*   No results for input(s): LIPASE, AMYLASE in the last 168 hours. No results for input(s): AMMONIA in the last 168 hours. CBC:  Recent  Labs Lab 10/11/14 0510  10/13/14 0500 10/13/14 0700 10/13/14 1350 10/13/14 2225 10/15/14 0300 10/16/14 0329  WBC 12.5*  < > 9.1 9.3 9.9 10.2 8.5 9.3  NEUTROABS 10.8*  --  7.7  --   --   --   --   --   HGB 9.2*  < > 7.8* 8.0* 8.2* 8.1* 8.0* 8.1*  HCT 26.6*  < > 23.6* 24.0* 24.6* 24.3* 23.7* 24.1*  MCV 86.9  < > 89.1 88.9 88.2 87.4 85.9 86.1  PLT 39*  < > 126* 131* 144* 165 192 263  < > = values in this interval not displayed. Cardiac Enzymes: No results for input(s): CKTOTAL, CKMB, CKMBINDEX, TROPONINI in the last 168 hours. BNP (last 3 results) No results for input(s): BNP in the last 8760 hours.  ProBNP (last 3 results) No results for input(s): PROBNP in the last 8760 hours.  CBG:  Recent Labs Lab 10/15/14 1212 10/15/14 1813 10/15/14 2241 10/16/14 0511 10/16/14 1210  GLUCAP 105* 103* 108* 114* 113*    Recent Results (from the past 240 hour(s))  MRSA PCR Screening     Status: None   Collection Time: 10/08/14  2:02 PM  Result Value Ref Range Status   MRSA by PCR NEGATIVE NEGATIVE Final    Comment:        The GeneXpert MRSA Assay (FDA approved for NASAL specimens only), is one component of a comprehensive MRSA colonization surveillance program. It is not intended to diagnose MRSA infection nor to guide or monitor treatment for MRSA infections. Performed at [facility]   Culture, respiratory (NON-Expectorated)     Status: None   Collection Time: 10/11/14 10:45 AM  Result Value Ref Range Status   Specimen Description TRACHEAL ASPIRATE  Final   Special Requests NONE  Final   Gram Stain   Final    ABUNDANT WBC PRESENT, PREDOMINANTLY PMN NO SQUAMOUS EPITHELIAL CELLS SEEN NO ORGANISMS SEEN Performed at [facility]    Culture   Final    Non-Pathogenic Oropharyngeal-type Flora Isolated. Performed at [facility]    Report Status 10/14/2014 FINAL  Final     Studies: No results found.  Scheduled Meds: . antiseptic oral rinse  7  mL Mouth Rinse QID  . chlorhexidine  15 mL Mouth Rinse BID  . insulin aspart  0-9 Units Subcutaneous 3 times per day  . pantoprazole (PROTONIX) IV  40 mg Intravenous Q12H  . sodium chloride  10-40 mL Intracatheter Q12H   Continuous Infusions: . dextrose 5 % and 0.45% NaCl 1,000 mL (10/14/14 1759)  . Marland Kitchen.TPN (CLINIMIX-E) Adult 83 mL/hr at 10/15/14 1727   And  . fat emulsion 240 mL (10/15/14 1727)  . Marland Kitchen.TPN (CLINIMIX-E) Adult     And  . fat emulsion      Active Problems:   Hypertension   Status post laparotomy with oversew of bleeding duodenal artery   Duodenal ulcer   UGIB (upper gastrointestinal bleed)  Acute post-hemorrhagic anemia   Duodenal ulcer hemorrhage   Polysubstance abuse   Acute blood loss anemia   Aspiration pneumonia   Ileus, postoperative    Time spent: 35 min    Jeralyn Bennett  [facility] Pager [phone]. If 7PM-7AM, please contact night-coverage at [email or web address], password [facility] 10/16/2014, 4:50 PM  LOS: 8 days

## 2014-10-16 NOTE — Progress Notes (Signed)
NUTRITION FOLLOW UP  Intervention:   - Continue TPN per pharmacy - RD will continue to monitor for diet advancement  Nutrition Dx:   Inadequate oral intake related to inability to eat as evidenced by NPO; ongoing  Goal:   Pt to meet >/= 90% of their estimated nutrition needs; met with TPN  Monitor:   TPN tolerance and advancement, labs, I/O's, weight trend  Assessment:   54 year old man with a history of ETOH abuse and recurrent UGIB due to ulcers who presented to ED 3/9 with melena and near-syncope. Patient was admitted to [facility] 3/6 with same problem and left AMA 3/8 in am.   3/11- s/p exploratory laparotomy, over sewing of duodenal ulcer and external over sewing of feeder vessels 3/13- extubated  - Pt with no obvious fat or muscle wasting - Pt becoming less fluid overloaded. Wt decreasing more towards normal body weight. - Pt tolerating TPN E 5/15 @ 83 mL/hr with lipids at 10 mL/hr - TPN goal rate providing 1894 kcal and 100 g protein. - Labs reviewed - RD will continue to monitor  Plan per pharmacy: At 18:00 tonight:  Continue Clinimix E 5/15 to goal rate of 83 ml/hr  Continue lipids at 10 ml/hr  TPN to contain standard MVI and trace elements daily   Height: Ht Readings from Last 1 Encounters:  10/08/14 5' 9"  (1.753 m)    Weight Status:   Wt Readings from Last 1 Encounters:  10/16/14 170 lb 13.7 oz (77.5 kg)    Re-estimated needs:  Kcal: 1800-2000 Protein: 100-115 g Fluid: Per MD  Skin: closed incision on abdomen  Diet Order: Diet NPO time specified Except for: Ice Chips TPN (CLINIMIX-E) Adult TPN (CLINIMIX-E) Adult   Intake/Output Summary (Last 24 hours) at 10/16/14 1310 Last data filed at 10/16/14 1212  Gross per 24 hour  Intake     10 ml  Output   4065 ml  Net  -4055 ml    Last BM: 3/11   Labs:   Recent Labs Lab 10/13/14 0500 10/14/14 0410 10/15/14 0300 10/16/14 0329  NA 145 141 142 140  K 3.7 3.6 3.4* 3.9  CL 112 106 103  103  CO2 29 32 34* 33*  BUN 11 11 12 14   CREATININE 0.73 0.80 0.76 0.86  CALCIUM 7.3* 7.5* 7.6* 7.5*  MG 2.1 1.9  --  2.1  PHOS 3.6 3.9  --  4.0  GLUCOSE 121* 151* 113* 113*    CBG (last 3)   Recent Labs  10/15/14 1813 10/15/14 2241 10/16/14 0511  GLUCAP 103* 108* 114*    Scheduled Meds: . antiseptic oral rinse  7 mL Mouth Rinse QID  . chlorhexidine  15 mL Mouth Rinse BID  . insulin aspart  0-9 Units Subcutaneous 3 times per day  . pantoprazole (PROTONIX) IV  40 mg Intravenous Q12H  . sodium chloride  10-40 mL Intracatheter Q12H    Continuous Infusions: . dextrose 5 % and 0.45% NaCl 1,000 mL (10/14/14 1759)  . Marland KitchenTPN (CLINIMIX-E) Adult 83 mL/hr at 10/15/14 1727   And  . fat emulsion 240 mL (10/15/14 1727)  . Marland KitchenTPN (CLINIMIX-E) Adult     And  . fat emulsion      Laurette Schimke MS, [place], LDN [phone]

## 2014-10-16 NOTE — Clinical Social Work Note (Signed)
CSW met with patient to discuss possible SNF placement- PT recommending this at dc- he is agreeable. CSW will begin SNF search as he will be difficult to place with Medicaid only and possible TNA need.    Eduard Clos, MSW, [place]

## 2014-10-16 NOTE — Clinical Social Work Note (Signed)
Clinical Social Work Department CLINICAL SOCIAL WORK PLACEMENT NOTE 10/16/2014  Patient:  [name],Husein  Account Number:  [id number] Admit date:  10/08/2014  Clinical Social Worker:  Robin SearingJANET Allex Lapoint, LCSWA  Date/time:  10/16/2014 04:07 PM  Clinical Social Work is seeking post-discharge placement for this patient at the following level of care:   SKILLED NURSING   (*CSW will update this form in Epic as items are completed)   10/16/2014  Patient/family provided with [facility] Department of Clinical Social Work's list of facilities offering this level of care within the geographic area requested by the patient (or if unable, by the patient's family).  10/16/2014  Patient/family informed of their freedom to choose among providers that offer the needed level of care, that participate in Medicare, Medicaid or managed care program needed by the patient, have an available bed and are willing to accept the patient.  10/16/2014  Patient/family informed of [facility]' ownership interest in [facility], as well as of the fact that they are under no obligation to receive care at this facility.  PASARR submitted to EDS on 10/16/2014 PASARR number received on 10/16/2014  FL2 transmitted to all facilities in geographic area requested by pt/family on  10/16/2014 FL2 transmitted to all facilities within larger geographic area on   Patient informed that his/her managed care company has contracts with or will negotiate with  certain facilities, including the following:     Patient/family informed of bed offers received:   Patient chooses bed at  Physician recommends and patient chooses bed at    Patient to be transferred to  on   Patient to be transferred to facility by  Patient and family notified of transfer on  Name of family member notified:    The following physician request were entered in Epic:   Additional Comments: Reece LevyJanet Jad Johansson, MSW, Theresia MajorsLCSWA [phone]

## 2014-10-16 NOTE — Progress Notes (Signed)
NG tube discontinued. Patient tolerated well.  

## 2014-10-17 LAB — CBC
HEMATOCRIT: 25.9 % — AB (ref 39.0–52.0)
HEMOGLOBIN: 8.5 g/dL — AB (ref 13.0–17.0)
MCH: 28.2 pg (ref 26.0–34.0)
MCHC: 32.8 g/dL (ref 30.0–36.0)
MCV: 86 fL (ref 78.0–100.0)
Platelets: 324 10*3/uL (ref 150–400)
RBC: 3.01 MIL/uL — ABNORMAL LOW (ref 4.22–5.81)
RDW: 13.3 % (ref 11.5–15.5)
WBC: 10.9 10*3/uL — ABNORMAL HIGH (ref 4.0–10.5)

## 2014-10-17 LAB — GLUCOSE, CAPILLARY
GLUCOSE-CAPILLARY: 118 mg/dL — AB (ref 70–99)
Glucose-Capillary: 137 mg/dL — ABNORMAL HIGH (ref 70–99)
Glucose-Capillary: 120 mg/dL — ABNORMAL HIGH (ref 70–99)
Glucose-Capillary: 129 mg/dL — ABNORMAL HIGH (ref 70–99)
Glucose-Capillary: 115 mg/dL — ABNORMAL HIGH (ref 70–99)

## 2014-10-17 LAB — BASIC METABOLIC PANEL
Anion gap: 8 (ref 5–15)
BUN: 15 mg/dL (ref 6–23)
CO2: 31 mmol/L (ref 19–32)
Calcium: 7.9 mg/dL — ABNORMAL LOW (ref 8.4–10.5)
Chloride: 103 mmol/L (ref 96–112)
Creatinine, Ser: 0.9 mg/dL (ref 0.50–1.35)
GFR calc Af Amer: >90 mL/min (ref >90)
GFR calc non Af Amer: >90 mL/min (ref >90)
GLUCOSE: 119 mg/dL — AB (ref 70–99)
Potassium: 4 mmol/L (ref 3.5–5.1)
Sodium: 142 mmol/L (ref 135–145)

## 2014-10-17 MED ORDER — FAT EMULSION 20 % IV EMUL
240.0000 mL | INTRAVENOUS | Status: AC
  Administered 2014-10-17: 240 mL via INTRAVENOUS
  Filled 2014-10-17: qty 250

## 2014-10-17 MED ORDER — CLINIMIX E/DEXTROSE (5/15) 5 % IV SOLN
INTRAVENOUS | Status: AC
  Administered 2014-10-17: 19:00:00 via INTRAVENOUS
  Filled 2014-10-17: qty 1992

## 2014-10-17 MED ORDER — ALTEPLASE 2 MG IJ SOLR
2.0000 mg | Freq: Once | INTRAMUSCULAR | Status: AC
  Administered 2014-10-18: 2 mg
  Filled 2014-10-17: qty 2

## 2014-10-17 NOTE — Progress Notes (Signed)
[facility] PROGRESS NOTE  [name] ZOX:[id number] DOB: 1960-12-24 DOA: 10/08/2014 PCP: [name], MD  Assessment/Plan: 1. Upper GI bleed -Patient initially admitted for melena and near-syncope, undergoing EGD on 10/09/2014 that showed large ulcer seen in duodenal bulb with large visible vessel, Hemoclip placed at base of visible vessel -On 10/10/2014 patient having hematemesis associated with abdominal pain on 10/10/2014, becoming hypotensive. Patient was emergently transfused and taken to the OR. -On 10/10/2014 patient undergoing exploratory laparotomy with Gastrografin and oversewing of duodenal ulcer external over sewing of feeder vessels to be devascularized. -NG tube removed on 10/16/2014, diet advanced to clears.  -Still no bowel movement.  2.  Aspiration pneumonia. -Patient going into acute respiratory failure likely secondary to aspiration pneumonia -Last chest x-ray was performed on 10/13/2014 showing airspace opacities persist unchanged. -Stopped IV antibiotics  3.  Acute blood loss anemia -Secondary upper GI bleed, patient was transfused a total of 8 units of packed red blood cells and 2 units of fresh frozen plasma -Lab showing stable hemoglobin of 8.5.  4.  Hypokalemia. -A.m. lab work showed potassium of 4.0  5.  Nutrition. -Patient on TPN  6.  History of polysubstance abuse -Counseled   Code Status: Full code Family Communication:  Disposition Plan:    Consultants:  GI  Gen. Surgery  Pulmonary critical care medicine  SIGNIFICANT EVENTS: 3/7 endoscopy with visible vessel, high risk for intervention 3/9 IR embolization could not identify a vessel 3/10 overnight passed bright red blood per rectum,  3/10: EGD. Endo-clipping of visible vessel w/in duodenal ulcer.  3/11 extubated. hgb drifted 1 gm over night.  3/11 Exploratory laparotomy, over sewing of duodenal ulcer and external over sewing of feeder vessels    Antibiotics:  Unasyn  HPI/Subjective: Patient reporting some abdominal pain, denies N/V. Still no bowel movement  Objective: Filed Vitals:   10/17/14 0606  BP: 134/77  Pulse: 89  Temp: 98 F (36.7 C)  Resp: 18    Intake/Output Summary (Last 24 hours) at 10/17/14 1411 Last data filed at 10/17/14 1407  Gross per 24 hour  Intake 1887.52 ml  Output   2755 ml  Net -867.48 ml   Filed Weights   10/15/14 0623 10/16/14 0500 10/17/14 0500  Weight: 78.1 kg (172 lb 2.9 oz) 77.5 kg (170 lb 13.7 oz) 77.4 kg (170 lb 10.2 oz)    Exam:   General: Sitting up in recliner chair comfortably. Right IJ line. NGT in place.  Cardiovascular: S1-S2 normal. No murmurs. Pulse regular.  Respiratory: Good air entry bilaterally. No rhonchi or rales.  Abdomen: Surgical wound with clean dressing. Tender to palpation..  Musculoskeletal: No pedal edema   Neurological: Intact  Data Reviewed: Basic Metabolic Panel:  Recent Labs Lab 10/10/14 1720 10/11/14 0510  10/13/14 0500 10/14/14 0410 10/15/14 0300 10/16/14 0329 10/17/14 0435  NA 142 144  < > 145 141 142 140 142  K 4.2 3.7  < > 3.7 3.6 3.4* 3.9 4.0  CL 119* 118*  < > 112 106 103 103 103  CO2 22 23  < > 29 32 34* 33* 31  GLUCOSE 168* 131*  < > 121* 151* 113* 113* 119*  BUN 24* 19  < > CREATININE 1.14 1.05  < > 0.73 0.80 0.76 0.86 0.90  CALCIUM 7.0* 7.1*  < > 7.3* 7.5* 7.6* 7.5* 7.9*  MG 1.5 2.2  --  2.1 1.9  --  2.1  --   PHOS 4.4 3.9  --  3.6 3.9  --  4.0  --   < > = values in this interval not displayed. Liver Function Tests:  Recent Labs Lab 10/11/14 0510 10/13/14 0500 10/16/14 0329  AST 33 22 32  ALT 26 16 22   ALKPHOS 31* 35* 74  BILITOT 0.6 0.6 0.7  PROT 3.4* 3.9* 4.4*  ALBUMIN 1.7* 1.6* 1.6*   No results for input(s): LIPASE, AMYLASE in the last 168 hours. No results for input(s): AMMONIA in the last 168 hours. CBC:  Recent Labs Lab 10/11/14 0510  10/13/14 0500  10/13/14 1350  10/13/14 2225 10/15/14 0300 10/16/14 0329 10/17/14 0435  WBC 12.5*  < > 9.1  < > 9.9 10.2 8.5 9.3 10.9*  NEUTROABS 10.8*  --  7.7  --   --   --   --   --   --   HGB 9.2*  < > 7.8*  < > 8.2* 8.1* 8.0* 8.1* 8.5*  HCT 26.6*  < > 23.6*  < > 24.6* 24.3* 23.7* 24.1* 25.9*  MCV 86.9  < > 89.1  < > 88.2 87.4 85.9 86.1 86.0  PLT 39*  < > 126*  < > 144* 165 192 263 324  < > = values in this interval not displayed. Cardiac Enzymes: No results for input(s): CKTOTAL, CKMB, CKMBINDEX, TROPONINI in the last 168 hours. BNP (last 3 results) No results for input(s): BNP in the last 8760 hours.  ProBNP (last 3 results) No results for input(s): PROBNP in the last 8760 hours.  CBG:  Recent Labs Lab 10/16/14 1210 10/16/14 1752 10/16/14 2253 10/17/14 0523 10/17/14 1217  GLUCAP 113* 96 137* 120* 129*    Recent Results (from the past 240 hour(s))  MRSA PCR Screening     Status: None   Collection Time: 10/08/14  2:02 PM  Result Value Ref Range Status   MRSA by PCR NEGATIVE NEGATIVE Final    Comment:        The GeneXpert MRSA Assay (FDA approved for NASAL specimens only), is one component of a comprehensive MRSA colonization surveillance program. It is not intended to diagnose MRSA infection nor to guide or monitor treatment for MRSA infections. Performed at [facility]   Culture, respiratory (NON-Expectorated)     Status: None   Collection Time: 10/11/14 10:45 AM  Result Value Ref Range Status   Specimen Description TRACHEAL ASPIRATE  Final   Special Requests NONE  Final   Gram Stain   Final    ABUNDANT WBC PRESENT, PREDOMINANTLY PMN NO SQUAMOUS EPITHELIAL CELLS SEEN NO ORGANISMS SEEN Performed at [facility]    Culture   Final    Non-Pathogenic Oropharyngeal-type Flora Isolated. Performed at [facility]    Report Status 10/14/2014 FINAL  Final     Studies: No results found.  Scheduled Meds: . antiseptic oral rinse  7 mL Mouth Rinse QID  .  chlorhexidine  15 mL Mouth Rinse BID  . insulin aspart  0-9 Units Subcutaneous 3 times per day  . pantoprazole (PROTONIX) IV  40 mg Intravenous Q12H  . sodium chloride  10-40 mL Intracatheter Q12H   Continuous Infusions: . dextrose 5 % and 0.45% NaCl 1,000 mL (10/14/14 1759)  . Marland Kitchen.TPN (CLINIMIX-E) Adult 83 mL/hr at 10/16/14 1750   And  . fat emulsion 240 mL (10/16/14 1750)  . Marland Kitchen.TPN (CLINIMIX-E) Adult     And  . fat emulsion      Active Problems:   Hypertension   Status post  laparotomy with oversew of bleeding duodenal artery   Duodenal ulcer   UGIB (upper gastrointestinal bleed)   Acute post-hemorrhagic anemia   Duodenal ulcer hemorrhage   Polysubstance abuse   Acute blood loss anemia   Aspiration pneumonia   Ileus, postoperative    Time spent: 25 min    Jeralyn Bennett  [facility] Pager [phone]. If 7PM-7AM, please contact night-coverage at [email or web address], password [facility] 10/17/2014, 2:11 PM  LOS: 9 days

## 2014-10-17 NOTE — Progress Notes (Signed)
PARENTERAL NUTRITION CONSULT NOTE - FOLLOW UP  Pharmacy Consult for TPN Indication: Bowel rest after massive GIB  Allergies  Allergen Reactions  . Chocolate Nausea And Vomiting  . Peanut-Containing Drug Products Nausea And Vomiting  . Spinach Nausea And Vomiting    Patient Measurements: Height:  (175.3 cm) Weight: 170 lb 10.2 oz (77.4 kg) IBW/kg (Calculated) : 70.7 Adjusted Body Weight: 76 kg  Vital Signs: Temp: 98 F (36.7 C) (03/18 0606) Temp Source: Oral (03/18 0606) BP: 134/77 mmHg (03/18 0606) Pulse Rate: 89 (03/18 0606) Intake/Output from previous day: 03/17 0701 - 03/18 0700 In: 0  Out: 2855 [Urine:2425; Emesis/NG output:300; Drains:130] Intake/Output from this shift: Total I/O In: -  Out: 475 [Urine:475]  Labs:  Recent Labs  10/15/14 0300 10/16/14 0329 10/17/14 0435  WBC 8.5 9.3 10.9*  HGB 8.0* 8.1* 8.5*  HCT 23.7* 24.1* 25.9*  PLT 192 263 324     Recent Labs  10/15/14 0300 10/16/14 0329 10/17/14 0435  NA 142 140 142  K 3.4* 3.9 4.0  CL 103 103 103  CO2 34* 33* 31  GLUCOSE 113* 113* 119*  BUN CREATININE 0.76 0.86 0.90  CALCIUM 7.6* 7.5* 7.9*  MG  --  2.1  --   PHOS  --  4.0  --   PROT  --  4.4*  --   ALBUMIN  --  1.6*  --   AST  --  32  --   ALT  --  22  --   ALKPHOS  --  74  --   BILITOT  --  0.7  --    Estimated Creatinine Clearance: 93.8 mL/min (by C-G formula based on Cr of 0.9).    Recent Labs  10/16/14 1210 10/16/14 1752 10/16/14 2253  GLUCAP 113* 96 137*    Medications:  Scheduled:  . antiseptic oral rinse  7 mL Mouth Rinse QID  . chlorhexidine  15 mL Mouth Rinse BID  . insulin aspart  0-9 Units Subcutaneous 3 times per day  . pantoprazole (PROTONIX) IV  40 mg Intravenous Q12H  . sodium chloride  10-40 mL Intracatheter Q12H   Infusions:  . dextrose 5 % and 0.45% NaCl 1,000 mL (10/14/14 1759)  . Marland KitchenTPN (CLINIMIX-E) Adult 83 mL/hr at 10/16/14 1750   And  . fat emulsion 240 mL (10/16/14 1750)    Insulin Requirements:  0 units Novolog sensitive scale used in past 24 hours  Current Nutrition:  NPO, ice chips Clinimix E 5/15 at 83 ml/hr Fat emulsion 20% at 10 ml/hr  IVF: D5 1/2NS at [facility] access: PICC placed 3/15. TPN start date: 3/12  ASSESSMENT                                                                                                          HPI: [id number] YOM presents with melena and near-syncope.  Has h/o EtOH and cocaine abuse w/ previous UGIB d/t ulcers.  EGD 3/7 revealed large non-bleeding duodenal bulb ulcer.  3/9 IR angiogram did not identify  bleeding vessel.  Patient intubated on 3/10 and EGD performed w/o evidence of active bleed and hemoclip placed on visible vessel.  He was extubated 3/11 and developed abdominal pain and vomited BRB x 3.  Taken to OR for ex lap and oversewing of ulcer.  J-P drain was placed.  Orders to start TPN d/t massive GIB and need for bowel rest.   Significant events:  3/11: ex lap for GI Bleed with oversewing of ulcer 3/12: remains sedated on vent, propofol currently at 30 mcg/kg/min = 15kcal/hr = 360kcal/day if this rate continues 3/14: propofol discontinued 3/13. Will add lipids to TPN orders today. 3/15: tx to floor. Started lipids. 3/17: no flatus or BM yet, tolerating goal TPN rate 3/18: Start CLD   Today: 10/17/2014  Glucose - at goal < 150  Electrolytes - remain WNL. Calcium corrects to WNL     Renal - Scr WNL, adequate UOP  LFTs - WNL (note EtOH hx), alb low  TGs - 67 (3/12), 43 (3/14)  Prealbumin - 10.9 (3/12)  NUTRITIONAL GOALS                                                                                             RD recs: Kcal: 1800-2000, Protein: 100-115 g Clinimix 5/15 at a goal rate of 6283ml/hr + 20% fat emulsion at 2910ml/hr to provide: 100g/day protein, 1894Kcal/day.  PLAN                                                                                                                         At 18:00  tonight:  Continue Clinimix E 5/15 to goal rate of 83 ml/hr  Continue lipids at 10 ml/hr  TPN to contain standard MVI and trace elements daily  Continue CBG q8h sensitive sliding scale  IVF per MD  TPN labs every Monday and Thursday. BMET in AM.  F/u if tolerating clear liquids.  Lynann Beaverhristine Hanaan Gancarz PharmD, BCPS Pager [phone] 10/17/2014 10:40 AM

## 2014-10-17 NOTE — Clinical Social Work Note (Signed)
CSW advised that patient has a Tour manager"Food Stamp hearing" next week and is requesting assistance with getting this rescheduled given he is hospitalized- CSW attempting to assist with this and will advise.  Reece LevyJanet Ragina Fenter, MSW, Theresia MajorsLCSWA [phone]

## 2014-10-17 NOTE — Progress Notes (Signed)
Patient ID: Einar GradRandy Bogacki, male   DOB: 06/20/1961, 54 y.o.   MRN: [id number] 7 Days Post-Op  Subjective: No complaints. One something to eat. No flatus or bowel movement yet.  Objective: Vital signs in last 24 hours: Temp:  [97.8 F (36.6 C)-98.7 F (37.1 C)] 98 F (36.7 C) (03/18 0606) Pulse Rate:  [82-92] 89 (03/18 0606) Resp:  [18-32] 18 (03/18 0606) BP: (134-146)/(69-77) 134/77 mmHg (03/18 0606) SpO2:  [90 %-93 %] 92 % (03/18 0606) Weight:  [77.4 kg (170 lb 10.2 oz)] 77.4 kg (170 lb 10.2 oz) (03/18 0500) Last BM Date: 10/05/14  Intake/Output from previous day: 03/17 0701 - 03/18 0700 In: 0  Out: 2855 [Urine:2425; Emesis/NG output:300; Drains:130] Intake/Output this shift:    General appearance: alert, cooperative, no distress and watching TV in bed GI: normal findings: soft, non-tender and nondistended Incision/Wound: clean and dry. JP drainage serous  Lab Results:   Recent Labs  10/16/14 0329 10/17/14 0435  WBC 9.3 10.9*  HGB 8.1* 8.5*  HCT 24.1* 25.9*  PLT 263 324   BMET  Recent Labs  10/16/14 0329 10/17/14 0435  NA 140 142  K 3.9 4.0  CL 103 103  CO2 33* 31  GLUCOSE 113* 119*  BUN 14 15  CREATININE 0.86 0.90  CALCIUM 7.5* 7.9*     Studies/Results: No results found.  Anti-infectives: Anti-infectives    Start     Dose/Rate Route Frequency Ordered Stop   10/11/14 1200  Ampicillin-Sulbactam (UNASYN) 3 g in sodium chloride 0.9 % 100 mL IVPB  Status:  Discontinued     3 g 100 mL/hr over 60 Minutes Intravenous Every 6 hours 10/11/14 1031 10/16/14 0856   10/10/14 2200  cefOXitin (MEFOXIN) 2 g in dextrose 5 % 50 mL IVPB  Status:  Discontinued     2 g 100 mL/hr over 30 Minutes Intravenous 3 times per day 10/10/14 1601 10/11/14 1030      Assessment/Plan: s/p Procedure(s): EXPLORATORY LAPAROTOMY WITH GASTROPATHY AND OVER SEWEN DUODENAL ULCER REGION  Doing well without apparent complication. Start clear liquid diet.    LOS: 9 days     Kailie Polus T 10/17/2014

## 2014-10-18 LAB — GLUCOSE, CAPILLARY
GLUCOSE-CAPILLARY: 116 mg/dL — AB (ref 70–99)
Glucose-Capillary: 104 mg/dL — ABNORMAL HIGH (ref 70–99)
Glucose-Capillary: 149 mg/dL — ABNORMAL HIGH (ref 70–99)

## 2014-10-18 MED ORDER — FAT EMULSION 20 % IV EMUL
240.0000 mL | INTRAVENOUS | Status: AC
  Administered 2014-10-18: 240 mL via INTRAVENOUS
  Filled 2014-10-18: qty 250

## 2014-10-18 MED ORDER — TRACE MINERALS CR-CU-F-FE-I-MN-MO-SE-ZN IV SOLN
INTRAVENOUS | Status: AC
  Administered 2014-10-18: 18:00:00 via INTRAVENOUS
  Filled 2014-10-18: qty 1992

## 2014-10-18 NOTE — Progress Notes (Signed)
PARENTERAL NUTRITION CONSULT NOTE - FOLLOW UP  Pharmacy Consult for TPN Indication: Bowel rest after massive GIB  Allergies  Allergen Reactions  . Chocolate Nausea And Vomiting  . Peanut-Containing Drug Products Nausea And Vomiting  . Spinach Nausea And Vomiting    Patient Measurements: Height: 5\' 9"  (175.3 cm) Weight: 166 lb 10.7 oz (75.6 kg) IBW/kg (Calculated) : 70.7 Adjusted Body Weight: 76 kg  Vital Signs: Temp: 98.3 F (36.8 C) (03/19 0535) Temp Source: Oral (03/19 0535) BP: 116/56 mmHg (03/19 0535) Pulse Rate: 88 (03/19 0535) Intake/Output from previous day: 03/18 0701 - 03/19 0700 In: 1887.5 [WUX:3244.0][TPN:1887.5] Out: 2525 [Urine:2495; Drains:30] Intake/Output from this shift: Total I/O In: 240 [P.O.:240] Out: 850 [Urine:550; Drains:300]  Labs:  Recent Labs  10/16/14 0329 10/17/14 0435  WBC 9.3 10.9*  HGB 8.1* 8.5*  HCT 24.1* 25.9*  PLT 263 324     Recent Labs  10/16/14 0329 10/17/14 0435  NA 140 142  K 3.9 4.0  CL 103 103  CO2 33* 31  GLUCOSE 113* 119*  BUN 14 15  CREATININE 0.86 0.90  CALCIUM 7.5* 7.9*  MG 2.1  --   PHOS 4.0  --   PROT 4.4*  --   ALBUMIN 1.6*  --   AST 32  --   ALT 22  --   ALKPHOS 74  --   BILITOT 0.7  --    Estimated Creatinine Clearance: 93.8 mL/min (by C-G formula based on Cr of 0.9).    Recent Labs  10/17/14 1817 10/17/14 2247 10/18/14 0533  GLUCAP 118* 115* 104*    Medications:  Scheduled:  . antiseptic oral rinse  7 mL Mouth Rinse QID  . chlorhexidine  15 mL Mouth Rinse BID  . insulin aspart  0-9 Units Subcutaneous 3 times per day  . pantoprazole (PROTONIX) IV  40 mg Intravenous Q12H  . sodium chloride  10-40 mL Intracatheter Q12H   Infusions:  . dextrose 5 % and 0.45% NaCl 1,000 mL (10/14/14 1759)  . Marland Kitchen.TPN (CLINIMIX-E) Adult 83 mL/hr at 10/17/14 1832   And  . fat emulsion 240 mL (10/17/14 1832)   Insulin Requirements:  1 unit Novolog sensitive scale used in past 24 hours  Current Nutrition:  Clear  liquid diet Clinimix E 5/15 at 83 ml/hr Fat emulsion 20% at 10 ml/hr  [facility]  Central access: PICC placed 3/15. TPN start date: 3/12  ASSESSMENT                                                                                                          HPI: [id number] YOM presents with melena and near-syncope.  Has h/o EtOH and cocaine abuse w/ previous UGIB d/t ulcers.  EGD 3/7 revealed large non-bleeding duodenal bulb ulcer.  3/9 IR angiogram did not identify bleeding vessel.  Patient intubated on 3/10 and EGD performed w/o evidence of active bleed and hemoclip placed on visible vessel.  He was extubated 3/11 and developed abdominal pain and vomited BRB x 3.  Taken to OR for ex  lap and oversewing of ulcer.  J-P drain was placed.  Orders to start TPN d/t massive GIB and need for bowel rest.   Significant events:  3/11: ex lap for GI Bleed with oversewing of ulcer 3/12: remains sedated on vent, propofol currently at 30 mcg/kg/min = 15kcal/hr = 360kcal/day if this rate continues 3/14: propofol discontinued 3/13. Will add lipids to TPN orders today. 3/15: tx to floor. Started lipids. 3/17: no flatus or BM yet, tolerating goal TPN rate 3/18: Start CLD 3/19: start full liquid diet  Today: 10/18/2014  Glucose - at goal < 150  Electrolytes - remain WNL. Calcium corrects to WNL     Renal - Scr WNL, adequate UOP  LFTs - WNL (note EtOH hx), alb low  TGs - 67 (3/12), 43 (3/14)  Prealbumin - 10.9 (3/12)  NUTRITIONAL GOALS                                                                                             RD recs: Kcal: 1800-2000, Protein: 100-115 g Clinimix 5/15 at a goal rate of 61ml/hr + 20% fat emulsion at 44ml/hr to provide: 100g/day protein, 1894Kcal/day.  PLAN                                                                                                                         At 18:00 tonight:  Continue Clinimix E 5/15 to goal rate of 83 ml/hr  Continue lipids at 10  ml/hr  TPN to contain standard MVI and trace elements daily  Continue CBG q8h sensitive sliding scale  IVF per MD  TPN labs every Monday and Thursday.   F/u if tolerating full liquid diet  Arley Phenix RPh 10/18/2014, 11:20 AM Pager [phone]

## 2014-10-18 NOTE — Progress Notes (Signed)
Patient ID: [name], male   DOB: [birth year], 54 y.o.   MRN: [id number]  General Surgery [facility]  POD#: 8  Subjective: Patient on the telephone, animated.  No complaints.  Tolerated clear liquids, wants to eat regular food.  BM yesterday per patient.  Passing flatus per patient.  Denies pain.  Objective: Vital signs in last 24 hours: Temp:  [97.9 F (36.6 C)-99.5 F (37.5 C)] 98.3 F (36.8 C) (03/19 0535) Pulse Rate:  [88-99] 88 (03/19 0535) Resp:  [20] 20 (03/19 0535) BP: (116-134)/(56-67) 116/56 mmHg (03/19 0535) SpO2:  [93 %-97 %] 93 % (03/19 0535) Weight:  [75.6 kg (166 lb 10.7 oz)] 75.6 kg (166 lb 10.7 oz) (03/19 0500) Last BM Date: 10/17/14  Intake/Output from previous day: 03/18 0701 - 03/19 0700 In: 1887.5 [NWG:9562.1] Out: 2525 [Urine:2495; Drains:30] Intake/Output this shift: Total I/O In: 240 [P.O.:240] Out: 850 [Urine:550; Drains:300]  Physical Exam: HEENT - sclerae clear, mucous membranes moist Abdomen - soft without distension; BS present; dressing dry and intact; JP drain with small serosanguinous Ext - no edema, non-tender Neuro - alert & oriented, no focal deficits  Lab Results:   Recent Labs  10/16/14 0329 10/17/14 0435  WBC 9.3 10.9*  HGB 8.1* 8.5*  HCT 24.1* 25.9*  PLT 263 324   BMET  Recent Labs  10/16/14 0329 10/17/14 0435  NA 140 142  K 3.9 4.0  CL 103 103  CO2 33* 31  GLUCOSE 113* 119*  BUN 14 15  CREATININE 0.86 0.90  CALCIUM 7.5* 7.9*   PT/INR No results for input(s): LABPROT, INR in the last 72 hours. Comprehensive Metabolic Panel:    Component Value Date/Time   NA 142 10/17/2014 0435   NA 140 10/16/2014 0329   K 4.0 10/17/2014 0435   K 3.9 10/16/2014 0329   CL 103 10/17/2014 0435   CL 103 10/16/2014 0329   CO2 31 10/17/2014 0435   CO2 33* 10/16/2014 0329   BUN 15 10/17/2014 0435   BUN 14 10/16/2014 0329   CREATININE 0.90 10/17/2014 0435   CREATININE 0.86 10/16/2014 0329   GLUCOSE 119*  10/17/2014 0435   GLUCOSE 113* 10/16/2014 0329   CALCIUM 7.9* 10/17/2014 0435   CALCIUM 7.5* 10/16/2014 0329   AST 32 10/16/2014 0329   AST 22 10/13/2014 0500   ALT 22 10/16/2014 0329   ALT 16 10/13/2014 0500   ALKPHOS 74 10/16/2014 0329   ALKPHOS 35* 10/13/2014 0500   BILITOT 0.7 10/16/2014 0329   BILITOT 0.6 10/13/2014 0500   PROT 4.4* 10/16/2014 0329   PROT 3.9* 10/13/2014 0500   ALBUMIN 1.6* 10/16/2014 0329   ALBUMIN 1.6* 10/13/2014 0500    Studies/Results: No results found.  Anti-infectives: Anti-infectives    Start     Dose/Rate Route Frequency Ordered Stop   10/11/14 1200  Ampicillin-Sulbactam (UNASYN) 3 g in sodium chloride 0.9 % 100 mL IVPB  Status:  Discontinued     3 g 100 mL/hr over 60 Minutes Intravenous Every 6 hours 10/11/14 1031 10/16/14 0856   10/10/14 2200  cefOXitin (MEFOXIN) 2 g in dextrose 5 % 50 mL IVPB  Status:  Discontinued     2 g 100 mL/hr over 30 Minutes Intravenous 3 times per day 10/10/14 1601 10/11/14 1030      Assessment & Plans: Status post ex lap and oversew of duodenal ulcer  Advance to full liquid diet  Encouraged OOB, ambulation  Wound care  Monitor drain output as diet advanced  Velora Hecklerodd M. Eldra Word, MD, [facility] Office: [phone]   Cassandre Oleksy Judie PetitM 10/18/2014

## 2014-10-18 NOTE — Progress Notes (Signed)
[facility] PROGRESS NOTE  Einar GradRandy Stalker WJX:[id number] DOB: 05/02/1961 DOA: 10/08/2014 PCP: Jackie PlumSEI-BONSU,GEORGE, MD  Assessment/Plan: 1. Upper GI bleed -Patient initially admitted for melena and near-syncope, undergoing EGD on 10/09/2014 that showed large ulcer seen in duodenal bulb with large visible vessel, Hemoclip placed at base of visible vessel -On 10/10/2014 patient having hematemesis associated with abdominal pain on 10/10/2014, becoming hypotensive. Patient was emergently transfused and taken to the OR. -On 10/10/2014 patient undergoing exploratory laparotomy with Gastrografin and oversewing of duodenal ulcer external over sewing of feeder vessels to be devascularized. -Diet advanced to fulls, seems to be doing better today  2.  Aspiration pneumonia. -Patient going into acute respiratory failure likely secondary to aspiration pneumonia -Last chest x-ray was performed on 10/13/2014 showing airspace opacities persist unchanged. -Stopped IV antibiotics  3.  Acute blood loss anemia -Secondary upper GI bleed, patient was transfused a total of 8 units of packed red blood cells and 2 units of fresh frozen plasma -Lab showing stable hemoglobin of 8.5.  4.  Hypokalemia. -A.m. lab work showed potassium of 4.0  5.  Nutrition. -Patient on TPN  6.  History of polysubstance abuse -Counseled   Code Status: Full code Family Communication:  Disposition Plan:    Consultants:  GI  Gen. Surgery  Pulmonary critical care medicine  SIGNIFICANT EVENTS: 3/7 endoscopy with visible vessel, high risk for intervention 3/9 IR embolization could not identify a vessel 3/10 overnight passed bright red blood per rectum,  3/10: EGD. Endo-clipping of visible vessel w/in duodenal ulcer.  3/11 extubated. hgb drifted 1 gm over night.  3/11 Exploratory laparotomy, over sewing of duodenal ulcer and external over sewing of feeder vessels   Antibiotics:  Unasyn  HPI/Subjective: Patient  reporting some abdominal pain, denies N/V. Still no bowel movement  Objective: Filed Vitals:   10/18/14 1500  BP: 145/83  Pulse: 98  Temp: 98.6 F (37 C)  Resp: 18    Intake/Output Summary (Last 24 hours) at 10/18/14 1623 Last data filed at 10/18/14 1400  Gross per 24 hour  Intake    240 ml  Output   2230 ml  Net  -1990 ml   Filed Weights   10/16/14 0500 10/17/14 0500 10/18/14 0500  Weight: 77.5 kg (170 lb 13.7 oz) 77.4 kg (170 lb 10.2 oz) 75.6 kg (166 lb 10.7 oz)    Exam:   General: Sitting up in recliner chair comfortably. Right IJ line. NGT in place.  Cardiovascular: S1-S2 normal. No murmurs. Pulse regular.  Respiratory: Good air entry bilaterally. No rhonchi or rales.  Abdomen: Surgical wound with clean dressing. Tender to palpation..  Musculoskeletal: No pedal edema   Neurological: Intact  Data Reviewed: Basic Metabolic Panel:  Recent Labs Lab 10/13/14 0500 10/14/14 0410 10/15/14 0300 10/16/14 0329 10/17/14 0435  NA 145 141 142 140 142  K 3.7 3.6 3.4* 3.9 4.0  CL 112 106 103 103 103  CO2 29 32 34* 33* 31  GLUCOSE 121* 151* 113* 113* 119*  BUN 11 11 12 14 15   CREATININE 0.73 0.80 0.76 0.86 0.90  CALCIUM 7.3* 7.5* 7.6* 7.5* 7.9*  MG 2.1 1.9  --  2.1  --   PHOS 3.6 3.9  --  4.0  --    Liver Function Tests:  Recent Labs Lab 10/13/14 0500 10/16/14 0329  AST 22 32  ALT 16 22  ALKPHOS 35* 74  BILITOT 0.6 0.7  PROT 3.9* 4.4*  ALBUMIN 1.6* 1.6*   No results for input(s): LIPASE, AMYLASE  in the last 168 hours. No results for input(s): AMMONIA in the last 168 hours. CBC:  Recent Labs Lab 10/13/14 0500  10/13/14 1350 10/13/14 2225 10/15/14 0300 10/16/14 0329 10/17/14 0435  WBC 9.1  < > 9.9 10.2 8.5 9.3 10.9*  NEUTROABS 7.7  --   --   --   --   --   --   HGB 7.8*  < > 8.2* 8.1* 8.0* 8.1* 8.5*  HCT 23.6*  < > 24.6* 24.3* 23.7* 24.1* 25.9*  MCV 89.1  < > 88.2 87.4 85.9 86.1 86.0  PLT 126*  < > 144* 165 192 263 324  < > = values in  this interval not displayed. Cardiac Enzymes: No results for input(s): CKTOTAL, CKMB, CKMBINDEX, TROPONINI in the last 168 hours. BNP (last 3 results) No results for input(s): BNP in the last 8760 hours.  ProBNP (last 3 results) No results for input(s): PROBNP in the last 8760 hours.  CBG:  Recent Labs Lab 10/17/14 1217 10/17/14 1817 10/17/14 2247 10/18/14 0533 10/18/14 1135  GLUCAP 129* 118* 115* 104* 116*    Recent Results (from the past 240 hour(s))  Culture, respiratory (NON-Expectorated)     Status: None   Collection Time: 10/11/14 10:45 AM  Result Value Ref Range Status   Specimen Description TRACHEAL ASPIRATE  Final   Special Requests NONE  Final   Gram Stain   Final    ABUNDANT WBC PRESENT, PREDOMINANTLY PMN NO SQUAMOUS EPITHELIAL CELLS SEEN NO ORGANISMS SEEN Performed at [facility]    Culture   Final    Non-Pathogenic Oropharyngeal-type Flora Isolated. Performed at [facility]    Report Status 10/14/2014 FINAL  Final     Studies: No results found.  Scheduled Meds: . antiseptic oral rinse  7 mL Mouth Rinse QID  . chlorhexidine  15 mL Mouth Rinse BID  . insulin aspart  0-9 Units Subcutaneous 3 times per day  . pantoprazole (PROTONIX) IV  40 mg Intravenous Q12H  . sodium chloride  10-40 mL Intracatheter Q12H   Continuous Infusions: . dextrose 5 % and 0.45% NaCl 1,000 mL (10/14/14 1759)  . Marland KitchenTPN (CLINIMIX-E) Adult 83 mL/hr at 10/17/14 1832   And  . fat emulsion 240 mL (10/17/14 1832)  . Marland KitchenTPN (CLINIMIX-E) Adult     And  . fat emulsion      Active Problems:   Hypertension   Status post laparotomy with oversew of bleeding duodenal artery   Duodenal ulcer   UGIB (upper gastrointestinal bleed)   Acute post-hemorrhagic anemia   Duodenal ulcer hemorrhage   Polysubstance abuse   Acute blood loss anemia   Aspiration pneumonia   Ileus, postoperative    Time spent: 15 min    Jeralyn Bennett  [facility] Pager  [phone]. If 7PM-7AM, please contact night-coverage at [email or web address], password [facility] 10/18/2014, 4:23 PM  LOS: 10 days

## 2014-10-19 DIAGNOSIS — K913 Postprocedural intestinal obstruction: Secondary | ICD-10-CM

## 2014-10-19 LAB — GLUCOSE, CAPILLARY
GLUCOSE-CAPILLARY: 102 mg/dL — AB (ref 70–99)
Glucose-Capillary: 121 mg/dL — ABNORMAL HIGH (ref 70–99)
Glucose-Capillary: 107 mg/dL — ABNORMAL HIGH (ref 70–99)

## 2014-10-19 MED ORDER — TRACE MINERALS CR-CU-F-FE-I-MN-MO-SE-ZN IV SOLN
INTRAVENOUS | Status: DC
  Administered 2014-10-19: 18:00:00 via INTRAVENOUS
  Filled 2014-10-19: qty 1992

## 2014-10-19 MED ORDER — INSULIN ASPART 100 UNIT/ML ~~LOC~~ SOLN: 0.0000 [IU] | Freq: Three times a day (TID) | SUBCUTANEOUS | Status: DC

## 2014-10-19 MED ORDER — FAT EMULSION 20 % IV EMUL
240.0000 mL | INTRAVENOUS | Status: DC
  Administered 2014-10-19: 240 mL via INTRAVENOUS
  Filled 2014-10-19: qty 250

## 2014-10-19 NOTE — Progress Notes (Signed)
Patient ID: [name], male   DOB: 11/18/1960, 54 y.o.   MRN: [id number]  General Surgery [facility]  POD#: 9  Subjective: Patient wants to eat regular diet.  Denies pain, nausea, emesis.  States he ambulated and sat in chair yesterday, passing flatus, no BM.  Objective: Vital signs in last 24 hours: Temp:  [97.5 F (36.4 C)-98.6 F (37 C)] 98.1 F (36.7 C) (03/20 0606) Pulse Rate:  [86-100] 86 (03/20 0606) Resp:  [18] 18 (03/20 0606) BP: (142-153)/(74-92) 142/78 mmHg (03/20 0606) SpO2:  [93 %-100 %] 100 % (03/20 0606) Weight:  [71.6 kg (157 lb 13.6 oz)] 71.6 kg (157 lb 13.6 oz) (03/20 0606) Last BM Date: 10/17/14  Intake/Output from previous day: 03/19 0701 - 03/20 0700 In: 3277.1 [P.O.:720; I.V.:967; TPN:1590.1] Out: 3010 [Urine:2700; Drains:310] Intake/Output this shift:    Physical Exam: HEENT - sclerae clear, mucous membranes moist Neck - soft, dressing intact Chest - clear bilaterally Cor - RRR Abdomen - soft, non-distended; midline wound with packing, clean, granulating; JP drain with thin serous output Ext - no edema, non-tender Neuro - alert & oriented, no focal deficits  Lab Results:   Recent Labs  10/17/14 0435  WBC 10.9*  HGB 8.5*  HCT 25.9*  PLT 324   BMET  Recent Labs  10/17/14 0435  NA 142  K 4.0  CL 103  CO2 31  GLUCOSE 119*  BUN 15  CREATININE 0.90  CALCIUM 7.9*   PT/INR No results for input(s): LABPROT, INR in the last 72 hours. Comprehensive Metabolic Panel:    Component Value Date/Time   NA 142 10/17/2014 0435   NA 140 10/16/2014 0329   K 4.0 10/17/2014 0435   K 3.9 10/16/2014 0329   CL 103 10/17/2014 0435   CL 103 10/16/2014 0329   CO2 31 10/17/2014 0435   CO2 33* 10/16/2014 0329   BUN 15 10/17/2014 0435   BUN 14 10/16/2014 0329   CREATININE 0.90 10/17/2014 0435   CREATININE 0.86 10/16/2014 0329   GLUCOSE 119* 10/17/2014 0435   GLUCOSE 113* 10/16/2014 0329   CALCIUM 7.9* 10/17/2014 0435   CALCIUM 7.5* 10/16/2014 0329   AST 32 10/16/2014 0329   AST 22 10/13/2014 0500   ALT 22 10/16/2014 0329   ALT 16 10/13/2014 0500   ALKPHOS 74 10/16/2014 0329   ALKPHOS 35* 10/13/2014 0500   BILITOT 0.7 10/16/2014 0329   BILITOT 0.6 10/13/2014 0500   PROT 4.4* 10/16/2014 0329   PROT 3.9* 10/13/2014 0500   ALBUMIN 1.6* 10/16/2014 0329   ALBUMIN 1.6* 10/13/2014 0500    Studies/Results: No results found.  Anti-infectives: Anti-infectives    Start     Dose/Rate Route Frequency Ordered Stop   10/11/14 1200  Ampicillin-Sulbactam (UNASYN) 3 g in sodium chloride 0.9 % 100 mL IVPB  Status:  Discontinued     3 g 100 mL/hr over 60 Minutes Intravenous Every 6 hours 10/11/14 1031 10/16/14 0856   10/10/14 2200  cefOXitin (MEFOXIN) 2 g in dextrose 5 % 50 mL IVPB  Status:  Discontinued     2 g 100 mL/hr over 30 Minutes Intravenous 3 times per day 10/10/14 1601 10/11/14 1030      Assessment & Plans: Status post ex lap and oversew of duodenal uler  Advance to regular diet  Wound care  OOB, ambulate  Will follow  Velora Hecklerodd M. Ivori Storr, MD, [facility] Office: [phone]   Guido Comp Judie PetitM 10/19/2014

## 2014-10-19 NOTE — Progress Notes (Signed)
[facility] PROGRESS NOTE  Einar GradRandy Brotzman ZOX:[id number] DOB: 10/02/1960 DOA: 10/08/2014 PCP: Jackie PlumSEI-BONSU,GEORGE, MD  Assessment/Plan: 1. Upper GI bleed -Patient initially admitted for melena and near-syncope, undergoing EGD on 10/09/2014 that showed large ulcer seen in duodenal bulb with large visible vessel, Hemoclip placed at base of visible vessel -On 10/10/2014 patient having hematemesis associated with abdominal pain on 10/10/2014, becoming hypotensive. Patient was emergently transfused and taken to the OR. -On 10/10/2014 patient undergoing exploratory laparotomy with Gastrografin and oversewing of duodenal ulcer external over sewing of feeder vessels to be devascularized. -Advancing to regular diet, if he tolerates can stop TPN  2.  Aspiration pneumonia. -Patient going into acute respiratory failure likely secondary to aspiration pneumonia -Last chest x-ray was performed on 10/13/2014 showing airspace opacities persist unchanged. -Stopped IV antibiotics  3.  Acute blood loss anemia -Secondary upper GI bleed, patient was transfused a total of 8 units of packed red blood cells and 2 units of fresh frozen plasma -Lab showing stable hemoglobin of 8.5.  4.  Hypokalemia. -A.m. lab work showed potassium of 4.0  5.  Nutrition. -Patient on TPN  6.  History of polysubstance abuse -Counseled   Code Status: Full code Family Communication:  Disposition Plan:    Consultants:  GI  Gen. Surgery  Pulmonary critical care medicine  SIGNIFICANT EVENTS: 3/7 endoscopy with visible vessel, high risk for intervention 3/9 IR embolization could not identify a vessel 3/10 overnight passed bright red blood per rectum,  3/10: EGD. Endo-clipping of visible vessel w/in duodenal ulcer.  3/11 extubated. hgb drifted 1 gm over night.  3/11 Exploratory laparotomy, over sewing of duodenal ulcer and external over sewing of feeder vessels   Antibiotics:  Unasyn  HPI/Subjective: Patient  reporting some abdominal pain, denies N/V. Still no bowel movement  Objective: Filed Vitals:   10/19/14 0606  BP: 142/78  Pulse: 86  Temp: 98.1 F (36.7 C)  Resp: 18    Intake/Output Summary (Last 24 hours) at 10/19/14 1250 Last data filed at 10/19/14 0617  Gross per 24 hour  Intake 3037.14 ml  Output   2160 ml  Net 877.14 ml   Filed Weights   10/17/14 0500 10/18/14 0500 10/19/14 0606  Weight: 77.4 kg (170 lb 10.2 oz) 75.6 kg (166 lb 10.7 oz) 71.6 kg (157 lb 13.6 oz)    Exam:   General: Sitting up in recliner chair comfortably. Right IJ line. NGT in place.  Cardiovascular: S1-S2 normal. No murmurs. Pulse regular.  Respiratory: Good air entry bilaterally. No rhonchi or rales.  Abdomen: Surgical wound with clean dressing. Tender to palpation..  Musculoskeletal: No pedal edema   Neurological: Intact  Data Reviewed: Basic Metabolic Panel:  Recent Labs Lab 10/13/14 0500 10/14/14 0410 10/15/14 0300 10/16/14 0329 10/17/14 0435  NA 145 141 142 140 142  K 3.7 3.6 3.4* 3.9 4.0  CL 112 106 103 103 103  CO2 29 32 34* 33* 31  GLUCOSE 121* 151* 113* 113* 119*  BUN 11 11 12 14 15   CREATININE 0.73 0.80 0.76 0.86 0.90  CALCIUM 7.3* 7.5* 7.6* 7.5* 7.9*  MG 2.1 1.9  --  2.1  --   PHOS 3.6 3.9  --  4.0  --    Liver Function Tests:  Recent Labs Lab 10/13/14 0500 10/16/14 0329  AST 22 32  ALT 16 22  ALKPHOS 35* 74  BILITOT 0.6 0.7  PROT 3.9* 4.4*  ALBUMIN 1.6* 1.6*   No results for input(s): LIPASE, AMYLASE in the last 168  hours. No results for input(s): AMMONIA in the last 168 hours. CBC:  Recent Labs Lab 10/13/14 0500  10/13/14 1350 10/13/14 2225 10/15/14 0300 10/16/14 0329 10/17/14 0435  WBC 9.1  < > 9.9 10.2 8.5 9.3 10.9*  NEUTROABS 7.7  --   --   --   --   --   --   HGB 7.8*  < > 8.2* 8.1* 8.0* 8.1* 8.5*  HCT 23.6*  < > 24.6* 24.3* 23.7* 24.1* 25.9*  MCV 89.1  < > 88.2 87.4 85.9 86.1 86.0  PLT 126*  < > 144* 165 192 263 324  < > = values in  this interval not displayed. Cardiac Enzymes: No results for input(s): CKTOTAL, CKMB, CKMBINDEX, TROPONINI in the last 168 hours. BNP (last 3 results) No results for input(s): BNP in the last 8760 hours.  ProBNP (last 3 results) No results for input(s): PROBNP in the last 8760 hours.  CBG:  Recent Labs Lab 10/18/14 0533 10/18/14 1135 10/18/14 1805 10/19/14 0301 10/19/14 1238  GLUCAP 104* 116* 149* 121* 107*    Recent Results (from the past 240 hour(s))  Culture, respiratory (NON-Expectorated)     Status: None   Collection Time: 10/11/14 10:45 AM  Result Value Ref Range Status   Specimen Description TRACHEAL ASPIRATE  Final   Special Requests NONE  Final   Gram Stain   Final    ABUNDANT WBC PRESENT, PREDOMINANTLY PMN NO SQUAMOUS EPITHELIAL CELLS SEEN NO ORGANISMS SEEN Performed at [facility]    Culture   Final    Non-Pathogenic Oropharyngeal-type Flora Isolated. Performed at [facility]    Report Status 10/14/2014 FINAL  Final     Studies: No results found.  Scheduled Meds: . antiseptic oral rinse  7 mL Mouth Rinse QID  . chlorhexidine  15 mL Mouth Rinse BID  . insulin aspart  0-9 Units Subcutaneous 3 times per day  . pantoprazole (PROTONIX) IV  40 mg Intravenous Q12H  . sodium chloride  10-40 mL Intracatheter Q12H   Continuous Infusions: . dextrose 5 % and 0.45% NaCl 10 mL/hr at 10/19/14 0307  . Marland KitchenTPN (CLINIMIX-E) Adult 83 mL/hr at 10/18/14 1737   And  . fat emulsion 240 mL (10/18/14 1738)  . Marland KitchenTPN (CLINIMIX-E) Adult     And  . fat emulsion      Active Problems:   Hypertension   Status post laparotomy with oversew of bleeding duodenal artery   Duodenal ulcer   UGIB (upper gastrointestinal bleed)   Acute post-hemorrhagic anemia   Duodenal ulcer hemorrhage   Polysubstance abuse   Acute blood loss anemia   Aspiration pneumonia   Ileus, postoperative    Time spent: 15 min    Jeralyn Bennett  [facility] Pager  [phone]. If 7PM-7AM, please contact night-coverage at [email or web address], password [facility] 10/19/2014, 12:50 PM  LOS: 11 days

## 2014-10-19 NOTE — Progress Notes (Addendum)
PARENTERAL NUTRITION CONSULT NOTE - FOLLOW UP  Pharmacy Consult for TPN Indication: Bowel rest after massive GIB  Allergies  Allergen Reactions  . Chocolate Nausea And Vomiting  . Peanut-Containing Drug Products Nausea And Vomiting  . Spinach Nausea And Vomiting    Patient Measurements: Height: 5\' 9"  (175.3 cm) Weight: 157 lb 13.6 oz (71.6 kg) IBW/kg (Calculated) : 70.7 Adjusted Body Weight: 76 kg  Vital Signs: Temp: 98.1 F (36.7 C) (03/20 0606) Temp Source: Oral (03/20 0606) BP: 142/78 mmHg (03/20 0606) Pulse Rate: 86 (03/20 0606) Intake/Output from previous day: 03/19 0701 - 03/20 0700 In: 3277.1 [P.O.:720; I.V.:967; TPN:1590.1] Out: 3010 [Urine:2700; Drains:310] Intake/Output from this shift:    Labs:  Recent Labs  10/17/14 0435  WBC 10.9*  HGB 8.5*  HCT 25.9*  PLT 324     Recent Labs  10/17/14 0435  NA 142  K 4.0  CL 103  CO2 31  GLUCOSE 119*  BUN 15  CREATININE 0.90  CALCIUM 7.9*   Estimated Creatinine Clearance: 93.8 mL/min (by C-G formula based on Cr of 0.9).    Recent Labs  10/18/14 1135 10/18/14 1805 10/19/14 0301  GLUCAP 116* 149* 121*    Medications:  Scheduled:  . antiseptic oral rinse  7 mL Mouth Rinse QID  . chlorhexidine  15 mL Mouth Rinse BID  . pantoprazole (PROTONIX) IV  40 mg Intravenous Q12H  . sodium chloride  10-40 mL Intracatheter Q12H   Infusions:  . dextrose 5 % and 0.45% NaCl 10 mL/hr at 10/19/14 0307  . Marland Kitchen.TPN (CLINIMIX-E) Adult 83 mL/hr at 10/18/14 1737   And  . fat emulsion 240 mL (10/18/14 1738)   Insulin Requirements:  0 units Novolog sensitive scale used in past 24 hours  Current Nutrition:  Advanced to regular diet this AM Clinimix E 5/15 at 83 ml/hr Fat emulsion 20% at 10 ml/hr  [facility]  [facility]: PICC placed 3/15. TPN start date: 3/12  ASSESSMENT                                                                                                          HPI: [id number] YOM presents  with melena and near-syncope.  Has h/o EtOH and cocaine abuse w/ previous UGIB d/t ulcers.  EGD 3/7 revealed large non-bleeding duodenal bulb ulcer.  3/9 IR angiogram did not identify bleeding vessel.  Patient intubated on 3/10 and EGD performed w/o evidence of active bleed and hemoclip placed on visible vessel.  He was extubated 3/11 and developed abdominal pain and vomited BRB x 3.  Taken to OR for ex lap and oversewing of ulcer.  J-P drain was placed.  Orders to start TPN d/t massive GIB and need for bowel rest.   Significant events:  3/11: ex lap for GI Bleed with oversewing of ulcer 3/12: remains sedated on vent, propofol currently at 30 mcg/kg/min = 15kcal/hr = 360kcal/day if this rate continues 3/14: propofol discontinued 3/13. Will add lipids to TPN orders today. 3/15: tx to floor. Started lipids. 3/17: no flatus or  BM yet, tolerating goal TPN rate 3/18: Start CLD 3/19: start full liquid diet 3/20: Advanced to regular diet today  Today: 10/19/2014  Glucose - at goal < 150  Electrolytes - remain WNL. Calcium corrects to WNL (3/18)  Renal - Scr WNL (3/18), adequate UOP  LFTs - WNL (note EtOH hx), alb low (3/18)  TGs - 67 (3/12), 43 (3/14)  Prealbumin - 10.9 (3/12)  NUTRITIONAL GOALS                                                                                             RD recs: Kcal: 1800-2000, Protein: 100-115 g Clinimix 5/15 at a goal rate of 59ml/hr + 20% fat emulsion at 46ml/hr to provide: 100g/day protein, 1894Kcal/day.  PLAN                                                                                                                         At 18:00 tonight:  Continue Clinimix E 5/15 at goal rate of 83 ml/hr  Continue 20% fat emulsion at 10 ml/hr  TPN to contain standard MVI and trace elements daily  Continue CBG q8h sensitive sliding scale  IVF per MD  TPN labs every Monday and Thursday.   F/u if tolerating regular diet-may be able to wean/discontinue  TPN soon.  Greer Pickerel, PharmD, BCPS Pager: [phone] 10/19/2014 11:39 AM

## 2014-10-20 DIAGNOSIS — F609 Personality disorder, unspecified: Secondary | ICD-10-CM

## 2014-10-20 DIAGNOSIS — K922 Gastrointestinal hemorrhage, unspecified: Secondary | ICD-10-CM | POA: Insufficient documentation

## 2014-10-20 LAB — DIFFERENTIAL
BASOS ABS: 0 10*3/uL (ref 0.0–0.1)
BASOS PCT: 0 % (ref 0–1)
Eosinophils Absolute: 0.3 10*3/uL (ref 0.0–0.7)
Eosinophils Relative: 2 % (ref 0–5)
Lymphocytes Relative: 13 % (ref 12–46)
Lymphs Abs: 1.5 10*3/uL (ref 0.7–4.0)
MONO ABS: 1.1 10*3/uL — AB (ref 0.1–1.0)
MONOS PCT: 9 % (ref 3–12)
Neutro Abs: 8.5 10*3/uL — ABNORMAL HIGH (ref 1.7–7.7)
Neutrophils Relative %: 76 % (ref 43–77)

## 2014-10-20 LAB — COMPREHENSIVE METABOLIC PANEL
ALT: 42 U/L (ref 0–53)
AST: 28 U/L (ref 0–37)
Albumin: 1.8 g/dL — ABNORMAL LOW (ref 3.5–5.2)
Alkaline Phosphatase: 94 U/L (ref 39–117)
Anion gap: 3 — ABNORMAL LOW (ref 5–15)
BILIRUBIN TOTAL: 0.3 mg/dL (ref 0.3–1.2)
BUN: 16 mg/dL (ref 6–23)
CO2: 27 mmol/L (ref 19–32)
Calcium: 7.5 mg/dL — ABNORMAL LOW (ref 8.4–10.5)
Chloride: 106 mmol/L (ref 96–112)
Creatinine, Ser: 1.05 mg/dL (ref 0.50–1.35)
GFR calc Af Amer: >90 mL/min (ref >90)
GFR, EST NON AFRICAN AMERICAN: 79 mL/min — AB (ref >90)
Glucose, Bld: 125 mg/dL — ABNORMAL HIGH (ref 70–99)
Potassium: 3.7 mmol/L (ref 3.5–5.1)
Sodium: 136 mmol/L (ref 135–145)
Total Protein: 5.2 g/dL — ABNORMAL LOW (ref 6.0–8.3)

## 2014-10-20 LAB — GLUCOSE, CAPILLARY
GLUCOSE-CAPILLARY: 113 mg/dL — AB (ref 70–99)
Glucose-Capillary: 84 mg/dL (ref 70–99)

## 2014-10-20 LAB — CBC
HEMATOCRIT: 21.3 % — AB (ref 39.0–52.0)
HEMOGLOBIN: 7 g/dL — AB (ref 13.0–17.0)
MCH: 28 pg (ref 26.0–34.0)
MCHC: 32.9 g/dL (ref 30.0–36.0)
MCV: 85.2 fL (ref 78.0–100.0)
Platelets: 320 10*3/uL (ref 150–400)
RBC: 2.5 MIL/uL — ABNORMAL LOW (ref 4.22–5.81)
RDW: 14.2 % (ref 11.5–15.5)
WBC: 11.4 10*3/uL — ABNORMAL HIGH (ref 4.0–10.5)

## 2014-10-20 LAB — MAGNESIUM: MAGNESIUM: 2 mg/dL (ref 1.5–2.5)

## 2014-10-20 LAB — PHOSPHORUS: PHOSPHORUS: 4 mg/dL (ref 2.3–4.6)

## 2014-10-20 LAB — TRIGLYCERIDES: TRIGLYCERIDES: 79 mg/dL (ref <150)

## 2014-10-20 MED ORDER — PANTOPRAZOLE SODIUM 40 MG PO TBEC: 40.0000 mg | DELAYED_RELEASE_TABLET | Freq: Two times a day (BID) | ORAL | Status: DC

## 2014-10-20 MED ORDER — PHENOL 1.4 % MT LIQD
2.0000 | OROMUCOSAL | Status: DC | PRN
  Filled 2014-10-20: qty 177

## 2014-10-20 MED ORDER — FERROUS SULFATE 325 (65 FE) MG PO TABS: 325.0000 mg | ORAL_TABLET | Freq: Every day | ORAL | Status: DC

## 2014-10-20 MED ORDER — MENTHOL 3 MG MT LOZG
1.0000 | LOZENGE | OROMUCOSAL | Status: DC | PRN
  Filled 2014-10-20: qty 9

## 2014-10-20 MED ORDER — TAB-A-VITE/IRON PO TABS
1.0000 | ORAL_TABLET | Freq: Every day | ORAL | Status: DC
  Filled 2014-10-20 (×2): qty 1

## 2014-10-20 MED ORDER — SACCHAROMYCES BOULARDII 250 MG PO CAPS
250.0000 mg | ORAL_CAPSULE | Freq: Two times a day (BID) | ORAL | Status: DC
  Filled 2014-10-20: qty 1

## 2014-10-20 MED ORDER — FERROUS SULFATE 325 (65 FE) MG PO TABS
325.0000 mg | ORAL_TABLET | Freq: Every day | ORAL | Status: DC
  Administered 2014-10-20: 325 mg via ORAL
  Filled 2014-10-20: qty 1

## 2014-10-20 MED ORDER — MAGIC MOUTHWASH
15.0000 mL | Freq: Four times a day (QID) | ORAL | Status: DC | PRN
  Filled 2014-10-20: qty 15

## 2014-10-20 MED ORDER — ALUM & MAG HYDROXIDE-SIMETH 200-200-20 MG/5ML PO SUSP: 30.0000 mL | Freq: Four times a day (QID) | ORAL | Status: DC | PRN

## 2014-10-20 MED ORDER — LIP MEDEX EX OINT
1.0000 "application " | TOPICAL_OINTMENT | Freq: Two times a day (BID) | CUTANEOUS | Status: DC
  Administered 2014-10-20: 1 via TOPICAL

## 2014-10-20 MED ORDER — DIPHENHYDRAMINE HCL 50 MG/ML IJ SOLN: 12.5000 mg | Freq: Four times a day (QID) | INTRAMUSCULAR | Status: DC | PRN

## 2014-10-20 MED ORDER — SODIUM CHLORIDE 0.9 % IJ SOLN: 3.0000 mL | Freq: Two times a day (BID) | INTRAMUSCULAR | Status: DC

## 2014-10-20 MED ORDER — SODIUM CHLORIDE 0.9 % IJ SOLN: 3.0000 mL | INTRAMUSCULAR | Status: DC | PRN

## 2014-10-20 MED ORDER — HYDROCODONE-ACETAMINOPHEN 5-325 MG PO TABS: 1.0000 | ORAL_TABLET | ORAL | Status: DC | PRN

## 2014-10-20 MED ORDER — ADULT MULTIVITAMIN W/MINERALS CH
1.0000 | ORAL_TABLET | Freq: Every day | ORAL | Status: DC
  Administered 2014-10-20: 1 via ORAL
  Filled 2014-10-20: qty 1

## 2014-10-20 MED ORDER — SACCHAROMYCES BOULARDII 250 MG PO CAPS: 250.0000 mg | ORAL_CAPSULE | Freq: Two times a day (BID) | ORAL | Status: DC

## 2014-10-20 MED ORDER — ACETAMINOPHEN 325 MG PO TABS: 650.0000 mg | ORAL_TABLET | ORAL | Status: DC | PRN

## 2014-10-20 MED ORDER — SODIUM CHLORIDE 0.9 % IV SOLN: 250.0000 mL | INTRAVENOUS | Status: DC | PRN

## 2014-10-20 MED ORDER — PROMETHAZINE HCL 25 MG/ML IJ SOLN: 6.2500 mg | INTRAMUSCULAR | Status: DC | PRN

## 2014-10-20 NOTE — Progress Notes (Signed)
PARENTERAL NUTRITION CONSULT NOTE - FOLLOW UP  Pharmacy Consult for TPN Indication: Bowel rest after massive GIB  Allergies  Allergen Reactions  . Chocolate Nausea And Vomiting  . Peanut-Containing Drug Products Nausea And Vomiting  . Spinach Nausea And Vomiting    Patient Measurements: Height:  (175.3 cm) Weight: 159 lb 2.8 oz (72.2 kg) IBW/kg (Calculated) : 70.7 Adjusted Body Weight: 76 kg  Vital Signs: Temp: 98.4 F (36.9 C) (03/21 0556) Temp Source: Oral (03/21 0556) BP: 147/70 mmHg (03/21 0556) Pulse Rate: 83 (03/21 0556) Intake/Output from previous day: 03/20 0701 - 03/21 0700 In: 3127.2 [P.O.:720; I.V.:237.2; TPN:2170] Out: 2571 [Urine:2550; Drains:20; Stool:1] Intake/Output from this shift:    Labs:  Recent Labs  10/20/14 0348  WBC 11.4*  HGB 7.0*  HCT 21.3*  PLT 320     Recent Labs  10/20/14 0348  NA 136  K 3.7  CL 106  CO2 27  GLUCOSE 125*  BUN 16  CREATININE 1.05  CALCIUM 7.5*  MG 2.0  PHOS 4.0  PROT 5.2*  ALBUMIN 1.8*  AST 28  ALT 42  ALKPHOS 94  BILITOT 0.3   Estimated Creatinine Clearance: 80.4 mL/min (by C-G formula based on Cr of 1.05).    Recent Labs  10/19/14 1238 10/19/14 2211 10/20/14 0537  GLUCAP 107* 102* 113*    Medications:  Scheduled:  . antiseptic oral rinse  7 mL Mouth Rinse QID  . insulin aspart  0-9 Units Subcutaneous 3 times per day  . pantoprazole (PROTONIX) IV  40 mg Intravenous Q12H  . sodium chloride  10-40 mL Intracatheter Q12H   Infusions:  . dextrose 5 % and 0.45% NaCl 10 mL/hr at 10/19/14 0307  . Marland KitchenTPN (CLINIMIX-E) Adult 83 mL/hr at 10/19/14 1739   And  . fat emulsion 240 mL (10/19/14 1739)   Insulin Requirements:  0 units Novolog sensitive scale used in past 24 hours  Current Nutrition:  Advanced to regular diet this AM Clinimix E 5/15 at 83 ml/hr Fat emulsion 20% at 10 ml/hr  IVF: D5 1[facility]2NS at [facility] access: PICC placed 3/15. TPN start date: 3/12  ASSESSMENT                                                                                                           HPI: [id number] YOM presents with melena and near-syncope.  Has h/o EtOH and cocaine abuse w/ previous UGIB d/t ulcers.  EGD 3/7 revealed large non-bleeding duodenal bulb ulcer.  3/9 IR angiogram did not identify bleeding vessel.  Patient intubated on 3/10 and EGD performed w/o evidence of active bleed and hemoclip placed on visible vessel.  He was extubated 3/11 and developed abdominal pain and vomited BRB x 3.  Taken to OR for ex lap and oversewing of ulcer.  J-P drain was placed.  Orders to start TPN d/t massive GIB and need for bowel rest.   Significant events:  3/11: ex lap for GI Bleed with oversewing of ulcer 3/12: remains sedated on vent, propofol currently at 30 mcg/kg/min =  15kcal/hr = 360kcal/day if this rate continues 3/14: propofol discontinued 3/13. Will add lipids to TPN orders today. 3/15: tx to floor. Started lipids. 3/17: no flatus or BM yet, tolerating goal TPN rate 3/18: Start CLD 3/19: start full liquid diet 3/20: Large BM, Advanced to regular diet today  Today: 10/20/2014  Glucose - at goal < 150  Electrolytes - remain WNL. Calcium corrects to WNL (3/18)  Renal - Scr WNL, slight rise in SCr 3/21am, adequate UOP  LFTs - WNL (note EtOH hx), alb low   TGs - 67 (3/12), 43 (3/14), 79 (3/21)  Prealbumin - 10.9 (3/12), pending (3/21)  NUTRITIONAL GOALS                                                                                             RD recs: Kcal: 1800-2000, Protein: 100-115 g Clinimix 5/15 at a goal rate of 7683ml/hr + 20% fat emulsion at 8110ml/hr to provide: 100g/day protein, 1894Kcal/day.  PLAN                                                                                                                         At 18:00 tonight:  Advanced to regular diet 3/20 and appears to have tolerated supper last evening. Plan is to likely stop TPN today.  Decrease TPN rate to 2640ml/hr  x 2-4h then stop  Can stop Lipids without wean  D/C TPN-related labs  IV to PO pantoprazole  The patient is receiving Protonix by the intravenous route.  Based on criteria approved by the Pharmacy and Therapeutics Committee and the Medical Executive Committee, the medication is being converted to the equivalent oral dose form.  These criteria include: -No Active GI bleeding -Able to tolerate diet of full liquids (or better) or tube feeding  If you have any questions about this conversion, please contact the Pharmacy Department (phone [phone]).  Thank you.  Juliette Alcideustin Zeigler, PharmD, BCPS.   Pager: [phone] 10/20/2014 7:57 AM

## 2014-10-20 NOTE — Progress Notes (Signed)
10 Days Post-Op  Subjective: He is complaining some about the incision.  It's open and healing well.  Nothing coming from the drain. Tolerating regular diet and 2 BM's yesterday, he says they are resuming normal consistency.    Objective: Vital signs in last 24 hours: Temp:  [98.4 F (36.9 C)-98.9 F (37.2 C)] 98.4 F (36.9 C) (03/21 0556) Pulse Rate:  [83-98] 83 (03/21 0556) Resp:  [18] 18 (03/21 0556) BP: (114-147)/(69-74) 147/70 mmHg (03/21 0556) SpO2:  [96 %-99 %] 99 % (03/21 0556) Weight:  [72.2 kg (159 lb 2.8 oz)] 72.2 kg (159 lb 2.8 oz) (03/21 0556) Last BM Date: 10/19/14  720 PO  Regular diet 20 ML from drain, it is clear. 1 BM Afebrile, VSS Labs OK  WBC still up some H/H is down some No films Intake/Output from previous day: 03/20 0701 - 03/21 0700 In: 3127.2 [P.O.:720; I.V.:237.2; TPN:2170] Out: 2571 [Urine:2550; Drains:20; Stool:1] Intake/Output this shift: Total I/O In: 120 [P.O.:120] Out: 200 [Urine:200]  General appearance: alert, cooperative and no distress Resp: clear to auscultation bilaterally GI: soft, sore wounds look fine, tolerating regular diet, + BM.  Lab Results:   Recent Labs  10/20/14 0348  WBC 11.4*  HGB 7.0*  HCT 21.3*  PLT 320    BMET  Recent Labs  10/20/14 0348  NA 136  K 3.7  CL 106  CO2 27  GLUCOSE 125*  BUN 16  CREATININE 1.05  CALCIUM 7.5*   PT/INR No results for input(s): LABPROT, INR in the last 72 hours.   Recent Labs Lab 10/16/14 0329 10/20/14 0348  AST 32 28  ALT 22 42  ALKPHOS 74 94  BILITOT 0.7 0.3  PROT 4.4* 5.2*  ALBUMIN 1.6* 1.8*     Lipase     Component Value Date/Time   LIPASE 42 05/27/2013 0900     Studies/Results: No results found.  Medications: . antiseptic oral rinse  7 mL Mouth Rinse QID  . pantoprazole  40 mg Oral BID  . sodium chloride  10-40 mL Intracatheter Q12H   . dextrose 5 % and 0.45% NaCl 10 mL/hr at 10/19/14 0307   Assessment/Plan 1. Recurrent duodenal bleed;  s/p oversewing of duodenal ulcer 08/10/12, and embolization of the gastroduodenal artery 05/03/13. 1A. Exploratory laparotomy, gastrorrhaphy, over sewing of duodenal ulcer and external over sewing of feeder vessels to be devascularized, 10/10/2014, [place]. Daphine Deutscher, MD.  POD10 1B. Upper endoscopy with control of bleeding,Endo clip application, 10/09/14, Dr. Rhea Belton 2. Hx of ETOH abuse/ cocaine use/marijuana use. 3. Psychiatric evaluation: pt did not have capacity 05/01/13. 4. Severe FE deficiency 5. Malnutrition albumin 1.9, total protein, 3.8./TNA 6. Tobacco use 7. Hypokalemia  8. Cocaine positive drug screen  9. Multiple transfusions 10. SCD for DVT, will defer chemical prophylaxis to Medicine. 11. Day 5 Unasyn 12. Post op ileus 13. Hypokalemia   Plan:  Wean off TNA, I will check on pulling drain, but I think he is ready for discharge from our standpoint.  He is off antibiotics, and I saw him walking with a walker and CNA.  Will ask for home health nurse to help with dressing change, he says his ride home is planning on going to beach soon. PT/OT to see if he needs any help at home.    I have added MVI with FE and FE after supper for his anemia.     LOS: 12 days       JENNINGS,WILLARD 10/20/2014  Feeling better Drain minimal & serosanguinous  D/C patient from hospital when patient meets criteria (anticipate later today):  Tolerating oral intake well Ambulating in walkways Adequate pain control without IV medications Urinating  Having flatus   Ardeth SportsmanSteven C. Timea Breed, M.D., F.A.C.S. Gastrointestinal and Minimally Invasive Surgery [facility] [address] [place], [place] [id number] [phone] Main / Paging

## 2014-10-20 NOTE — Progress Notes (Signed)
Physical Therapy Treatment Patient Details Name: Einar GradRandy Woodhams MRN: [id number] DOB: 09/06/1960 Today's Date: 10/20/2014    History of Present Illness 54 yo male s/p exp lap, gastrorrhaphy 10/10/14. Extubated 10/12/14. Hx of polysubstance abuse, anemia, HTN, recurrent UGIB with ulcers.     PT Comments    Pt is much improved with his mobility and medical condition.  Pt jump OOB when I mentioned prior rec for SNF.  Amb in hallway a great distance with out need for any AD.  Performed stair training with no issues of concern/safety.  Tolerated session well with no pain.  Much improved.  Follow Up Recommendations  Home health PT (one time home safety eval)  No equipment   Equipment Recommendations  None recommended by PT (pt declines the need for any AD)    Recommendations for Other Services       Precautions / Restrictions Precautions Precautions: Fall Precaution Comments: drain, abdominal surgery Restrictions Weight Bearing Restrictions: No    Mobility  Bed Mobility Overal bed mobility: Modified Independent             General bed mobility comments: pt jumped OOB when I mentioned poss of going to a SNF  Transfers Overall transfer level: Modified independent Equipment used: None Transfers: Sit to/from Stand           General transfer comment: good use of hands to steady self  Ambulation/Gait Ambulation/Gait assistance: Supervision Ambulation Distance (Feet): 585 Feet Assistive device: None Gait Pattern/deviations: Step-through pattern Gait velocity: quick spped but normal for him   General Gait Details: amb a great distance without AD.  Good alternating gait.  No LOB.    Stairs Stairs: Yes Stairs assistance: Modified independent (Device/Increase time) Stair Management: One rail Right;Alternating pattern;Forwards Number of Stairs: 3 General stair comments: well done  Wheelchair Mobility    Modified Rankin (Stroke Patients Only)       Balance                                    Cognition Arousal/Alertness: Awake/alert Behavior During Therapy: WFL for tasks assessed/performed                        Exercises      General Comments        Pertinent Vitals/Pain Pain Assessment: No/denies pain    Home Living                      Prior Function            PT Goals (current goals can now be found in the care plan section) Progress towards PT goals: Progressing toward goals    Frequency       PT Plan Current plan remains appropriate    Co-evaluation             End of Session Equipment Utilized During Treatment: Gait belt Activity Tolerance: Patient tolerated treatment well Patient left: in bed;with call bell/phone within reach     Time: 1457-1511 PT Time Calculation (min) (ACUTE ONLY): 14 min  Charges:  $Gait Training: 8-22 mins                    G Codes:      Felecia ShellingLori Cinderella Christoffersen  PTA [facility]  Acute  Rehab Pager      [phone]

## 2014-10-20 NOTE — Progress Notes (Signed)
Discharge instructions given to pt, verbalized understanding. Left the unit in stable condition. 

## 2014-10-20 NOTE — Progress Notes (Signed)
CARE MANAGEMENT NOTE 10/20/2014  Patient:  [name],[name]   Account Number:  [id number]  Date Initiated:  10/09/2014  Documentation initiated by:  DAVIS,RHONDA  Subjective/Objective Assessment:   gi bleed with hgb 5.2 and 6.3 s/p 3 units of prbc hx of etoh and substance abuse.     Action/Plan:   home when stable   Anticipated DC Date:  10/16/2014   Anticipated DC Plan:  SKILLED NURSING FACILITY  In-house referral  Clinical Social Worker      DC Planning Services  CM consult      [facility]  [facility]   Choice offered to / List presented to:  NA   DME arranged  NA      DME agency  NA     HH arranged  NA      HH agency  NA   Status of service:  In process, will continue to follow Medicare Important Message given?   (If response is "NO", the following Medicare IM given date fields will be blank) Date Medicare IM given:   Medicare IM given by:   Date Additional Medicare IM given:   Additional Medicare IM given by:    Discharge Disposition:    Per UR Regulation:  Reviewed for med. necessity/level of care/duration of stay  If discussed at Long Length of Stay Meetings, dates discussed:    Comments:  10/20/14 Ferdinand CavaAndrea Schettino RN BSN CM [phone] Patient stated that he has 2 roommates, he has no DME but is planning on going to a facility for rehab prior to returning home. Patient stated that he did his own dressings last time and can do them again this time. Will continue to follow for dc planning needs.  1.  Recurrent duodenal bleed; s/p oversewing of duodenal ulcer 08/10/12, and embolization of the gastroduodenal artery 05/03/13. 1A.  Exploratory laparotomy, gastrorrhaphy, over sewing of duodenal ulcer and external over sewing of feeder vessels to be devascularized, 10/10/2014, [place] ParkMatthew B. Daphine DeutscherMartin, MD.  [phone]/Rhonda Davis,RN,BSN,CCM

## 2014-10-20 NOTE — Discharge Instructions (Signed)
ABDOMINAL SURGERY: POST OP INSTRUCTIONS  1. DIET: Follow a light bland diet the first 24 hours after arrival home, such as soup, liquids, crackers, etc.  Be sure to include lots of fluids daily.  Avoid fast food or heavy meals as your are more likely to get nauseated.  Eat a low fat the next few days after surgery.   2. Take your usually prescribed home medications unless otherwise directed. 3. PAIN CONTROL: a. Pain is best controlled by a usual combination of three different methods TOGETHER: i. Ice/Heat ii. Over the counter pain medication iii. Prescription pain medication b. Most patients will experience some swelling and bruising around the incisions.  Ice packs or heating pads (30-60 minutes up to 6 times a day) will help. Use ice for the first few days to help decrease swelling and bruising, then switch to heat to help relax tight/sore spots and speed recovery.  Some people prefer to use ice alone, heat alone, alternating between ice & heat.  Experiment to what works for you.  Swelling and bruising can take several weeks to resolve.   c. It is helpful to take an over-the-counter pain medication regularly for the first few weeks.  Choose one of the following that works best for you: i. Naproxen (Aleve, etc)  Two 228m tabs twice a day ii. Ibuprofen (Advil, etc) Three 2071mtabs four times a day (every meal & bedtime) iii. Acetaminophen (Tylenol, etc) 500-65061mour times a day (every meal & bedtime) d. A  prescription for pain medication (such as oxycodone, hydrocodone, etc) should be given to you upon discharge.  Take your pain medication as prescribed.  i. If you are having problems/concerns with the prescription medicine (does not control pain, nausea, vomiting, rash, itching, etc), please call us [phone] see if we need to switch you to a different pain medicine that will work better for you and/or control your side effect better. ii. If you need a refill on your pain medication,  please contact your pharmacy.  They will contact our office to request authorization. Prescriptions will not be filled after 5 pm or on week-ends. 4. Avoid getting constipated.  Between the surgery and the pain medications, it is common to experience some constipation.  Increasing fluid intake and taking a fiber supplement (such as Metamucil, Citrucel, FiberCon, MiraLax, etc) 1-2 times a day regularly will usually help prevent this problem from occurring.  A mild laxative (prune juice, Milk of Magnesia, MiraLax, etc) should be taken according to package directions if there are no bowel movements after 48 hours.   5. Watch out for diarrhea.  If you have many loose bowel movements, simplify your diet to bland foods & liquids for a few days.  Stop any stool softeners and decrease your fiber supplement.  Switching to mild anti-diarrheal medications (Kayopectate, Pepto Bismol) can help.  If this worsens or does not improve, please call us.[place]. Wash / shower every day.  You may shower over the incision / wound.  Avoid baths until the skin is fully healed.  Continue to shower over incision(s) after the dressing is off. 7. Remove your waterproof bandages 5 days after surgery.  You may leave the incision open to air.  Remove any wicks or ribbons in your wound.  If you have an open wound, please see wound care instructions. You may replace a dressing/Band-Aid to cover the incision for comfort if you wish. 8. ACTIVITIES as tolerated:   a. You may resume regular (light)  daily activities beginning the next day--such as daily self-care, walking, climbing stairs--gradually increasing activities as tolerated.  If you can walk 30 minutes without difficulty, it is safe to try more intense activity such as jogging, treadmill, bicycling, low-impact aerobics, swimming, etc. b. Save the most intensive and strenuous activity for last such as sit-ups, heavy lifting, contact sports, etc  Refrain from any heavy lifting or straining  until you are off narcotics for pain control.   c. DO NOT PUSH THROUGH PAIN.  Let pain be your guide: If it hurts to do something, don't do it.  Pain is your body warning you to avoid that activity for another week until the pain goes down. d. You may drive when you are no longer taking prescription pain medication, you can comfortably wear a seatbelt, and you can safely maneuver your car and apply brakes. e. [name] may have sexual intercourse when it is comfortable.  9. FOLLOW UP in our office a. Please call [facility] at [phone] to set up an appointment to see your surgeon in the office for a follow-up appointment approximately 1-2 weeks after your surgery. b. Make sure that you call for this appointment the day you arrive home to insure a convenient appointment time. 10. IF YOU HAVE DISABILITY OR FAMILY LEAVE FORMS, BRING THEM TO THE OFFICE FOR PROCESSING.  DO NOT GIVE THEM TO YOUR DOCTOR.   WHEN TO CALL us [phone]: 1. Poor pain control 2. Reactions / problems with new medications (rash/itching, nausea, etc)  3. Fever over 101.5 F (38.5 C) 4. Inability to urinate 5. Nausea and/or vomiting 6. Worsening swelling or bruising 7. Continued bleeding from incision. 8. Increased pain, redness, or drainage from the incision  The clinic staff is available to answer your questions during regular business hours (8:30am-5pm).  Please dont hesitate to call and ask to speak to one of our nurses for clinical concerns.   A surgeon from [facility] is always on call at the hospitals   If you have a medical emergency, go to the nearest emergency room or call 911.    [facility] [address], [place], [place]  [address] ? MAIN: [phone] ? TOLL FREE: [phone] ? FAX [phone] [email or web address]  Managing Pain  Pain after surgery or related to activity is often due to strain/injury to muscle, tendon, nerves and/or  incisions.  This pain is usually short-term and will improve in a few months.   Many people find it helpful to do the following things TOGETHER to help speed the process of healing and to get back to regular activity more quickly:  1. Avoid heavy physical activity a.  no lifting greater than 20 pounds b. Do not push through the pain.  Listen to your body and avoid positions and maneuvers than reproduce the pain c. Walking is okay as tolerated, but go slowly and stop when getting sore.  d. Remember: If it hurts to do it, then dont do it! 2. Take Anti-inflammatory medication  a. Take with food/snack around the clock for 1-2 weeks i. This helps the muscle and nerve tissues become less irritable and calm down faster ii. Choose Acetaminophen  tabs (Tylenol) 1-2 pills with every meal and just before bedtime 3. Use a Heating pad or Ice/Cold Pack a. 4-6 times a day b. May use warm bath/hottub  or showers 4. Try Gentle Massage and/or Stretching  a. at the area of pain many times a day  b. stop if you feel pain - do not overdo it  Try these steps together to help you body heal faster and avoid making things get worse.  Doing just one of these things may not be enough.    If you are not getting better after two weeks or are noticing you are getting worse, contact our office for further advice; we may need to re-evaluate you & see what other things we can do to help.  GETTING TO GOOD BOWEL HEALTH. Irregular bowel habits such as constipation and diarrhea can lead to many problems over time.  Having one soft bowel movement a day is the most important way to prevent further problems.  The anorectal canal is designed to handle stretching and feces to safely manage our ability to get rid of solid waste (feces, poop, stool) out of our body.  BUT, hard constipated stools can act like ripping concrete bricks and diarrhea can be a burning fire to this very sensitive area of our body, causing inflamed  hemorrhoids, anal fissures, increasing risk is perirectal abscesses, abdominal pain/bloating, an making irritable bowel worse.     The goal: ONE SOFT BOWEL MOVEMENT A DAY!  To have soft, regular bowel movements:   Drink at least 8 tall glasses of water a day.    Take plenty of fiber.  Fiber is the undigested part of plant food that passes into the colon, acting s natures broom to encourage bowel motility and movement.  Fiber can absorb and hold large amounts of water. This results in a larger, bulkier stool, which is soft and easier to pass. Work gradually over several weeks up to 6 servings a day of fiber (25g a day even more if needed) in the form of: o Vegetables -- Root (potatoes, carrots, turnips), leafy green (lettuce, salad greens, celery, spinach), or cooked high residue (cabbage, broccoli, etc) o Fruit -- Fresh (unpeeled skin & pulp), Dried (prunes, apricots, cherries, etc ),  or stewed ( applesauce)  o Whole grain breads, pasta, etc (whole wheat)  o Bran cereals   Bulking Agents -- This type of water-retaining fiber generally is easily obtained each day by one of the following:  o Psyllium bran -- The psyllium plant is remarkable because its ground seeds can retain so much water. This product is available as Metamucil, Konsyl, Effersyllium, Per Diem Fiber, or the less expensive generic preparation in drug and health food stores. Although labeled a laxative, it really is not a laxative.  o Methylcellulose -- This is another fiber derived from wood which also retains water. It is available as Citrucel. o Polyethylene Glycol - and artificial fiber commonly called Miralax or Glycolax.  It is helpful for people with gassy or bloated feelings with regular fiber o Flax Seed - a less gassy fiber than psyllium  No reading or other relaxing activity while on the toilet. If bowel movements take longer than 5 minutes, you are too constipated  AVOID CONSTIPATION.  High fiber and water intake  usually takes care of this.  Sometimes a laxative is needed to stimulate more frequent bowel movements, but   Laxatives are not a good long-term solution as it can wear the colon out. o Osmotics (Milk of Magnesia, Fleets phosphosoda, Magnesium citrate, MiraLax, GoLytely) are safer than  o Stimulants (Senokot, Castor Oil, Dulcolax, Ex Lax)    o Do not take laxatives for more than 7days in a row.   IF SEVERELY CONSTIPATED, try a Bowel Retraining Program: o Do  not use laxatives.  o Eat a diet high in roughage, such as bran cereals and leafy vegetables.  o Drink six (6) ounces of prune or apricot juice each morning.  o Eat two (2) large servings of stewed fruit each day.  o Take one (1) heaping tablespoon of a psyllium-based bulking agent twice a day. Use sugar-free sweetener when possible to avoid excessive calories.  o Eat a normal breakfast.  o Set aside 15 minutes after breakfast to sit on the toilet, but do not strain to have a bowel movement.  o If you do not have a bowel movement by the third day, use an enema and repeat the above steps.   Controlling diarrhea o Switch to liquids and simpler foods for a few days to avoid stressing your intestines further. o Avoid dairy products (especially milk & ice cream) for a short time.  The intestines often can lose the ability to digest lactose when stressed. o Avoid foods that cause gassiness or bloating.  Typical foods include beans and other legumes, cabbage, broccoli, and dairy foods.  Every person has some sensitivity to other foods, so listen to our body and avoid those foods that trigger problems for you. o Adding fiber (Citrucel, Metamucil, psyllium, Miralax) gradually can help thicken stools by absorbing excess fluid and retrain the intestines to act more normally.  Slowly increase the dose over a few weeks.  Too much fiber too soon can backfire and cause cramping & bloating. o Probiotics (such as active yogurt, Align, etc) may help  repopulate the intestines and colon with normal bacteria and calm down a sensitive digestive tract.  Most studies show it to be of mild help, though, and such products can be costly. o Medicines: - Bismuth subsalicylate (ex. Kayopectate, Pepto Bismol) every 30 minutes for up to 6 doses can help control diarrhea.  Avoid if pregnant. - Loperamide (Immodium) can slow down diarrhea.  Start with two tablets (4mg  total) first and then try one tablet every 6 hours.  Avoid if you are having fevers or severe pain.  If you are not better or start feeling worse, stop all medicines and call your doctor for advice o Call your doctor if you are getting worse or not better.  Sometimes further testing (cultures, endoscopy, X-ray studies, bloodwork, etc) may be needed to help diagnose and treat the cause of the diarrhea.   Anemia, Nonspecific Anemia is a condition in which the concentration of red blood cells or hemoglobin in the blood is below normal. Hemoglobin is a substance in red blood cells that carries oxygen to the tissues of the body. Anemia results in not enough oxygen reaching these tissues.  CAUSES  Common causes of anemia include:   Excessive bleeding. Bleeding may be internal or external. This includes excessive bleeding from periods (in women) or from the intestine.   Poor nutrition.   Chronic kidney, thyroid, and liver disease.  Bone marrow disorders that decrease red blood cell production.  Cancer and treatments for cancer.  HIV, AIDS, and their treatments.  Spleen problems that increase red blood cell destruction.  Blood disorders.  Excess destruction of red blood cells due to infection, medicines, and autoimmune disorders. SIGNS AND SYMPTOMS   Minor weakness.   Dizziness.   Headache.  Palpitations.   Shortness of breath, especially with exercise.   Paleness.  Cold sensitivity.  Indigestion.  Nausea.  Difficulty sleeping.  Difficulty concentrating. Symptoms  may occur suddenly or they may develop slowly.  DIAGNOSIS  Additional blood tests are often needed. These help your health care provider determine the best treatment. Your health care provider will check your stool for blood and look for other causes of blood loss.  TREATMENT  Treatment varies depending on the cause of the anemia. Treatment can include:   Supplements of iron, vitamin B12, or folic acid.   Hormone medicines.   A blood transfusion. This may be needed if blood loss is severe.   Hospitalization. This may be needed if there is significant continual blood loss.   Dietary changes.  Spleen removal. HOME CARE INSTRUCTIONS Keep all follow-up appointments. It often takes many weeks to correct anemia, and having your health care provider check on your condition and your response to treatment is very important. SEEK IMMEDIATE MEDICAL CARE IF:   You develop extreme weakness, shortness of breath, or chest pain.   You become dizzy or have trouble concentrating.  You develop heavy vaginal bleeding.   You develop a rash.   You have bloody or black, tarry stools.   You faint.   You vomit up blood.   You vomit repeatedly.   You have abdominal pain.  You have a fever or persistent symptoms for more than 2-3 days.   You have a fever and your symptoms suddenly get worse.   You are dehydrated.  MAKE SURE YOU:  Understand these instructions.  Will watch your condition.  Will get help right away if you are not doing well or get worse. Document Released: 08/25/2004 Document Revised: 03/20/2013 Document Reviewed: 01/11/2013 [facility] Patient Information 2015 [place], [place]. This information is not intended to replace advice given to you by your health care provider. Make sure you discuss any questions you have with your health care provider.

## 2014-10-20 NOTE — Discharge Summary (Signed)
Physician Discharge Summary  [name] WUJ:[id number] DOB: 25-Jun-1961 DOA: 10/08/2014  PCP: [name], MD  Admit date: 10/08/2014 Discharge date: 10/20/2014  Time spent: 35 minutes  Recommendations for Outpatient Follow-up:  1. Prior to discharge, CM consulted for home health services for RN to address wound care; status post exploratory laparotomy 2. Drain removed prior to discharge 3. Please follow-up on CBC and BMP on hospital follow-up visit  Discharge Diagnoses:  Principal Problem:   Duodenal ulcer with hemorrhage s/p oversew 10/11/2014 Active Problems:   Hypertension   UGIB (upper gastrointestinal bleed)   Polysubstance abuse   Aspiration pneumonia   Ileus, postoperative   Personality disorder   Acute upper GI bleed   Discharge Condition: Stable, patient ambulating down the halls  Diet recommendation: Heart healthy  Filed Weights   10/18/14 0500 10/19/14 0606 10/20/14 0556  Weight: 75.6 kg (166 lb 10.7 oz) 71.6 kg (157 lb 13.6 oz) 72.2 kg (159 lb 2.8 oz)    History of present illness:  Note, limited hx from patient as he was uncooperative during the interview and did not want to answer questions or talk. He also did not allow this provider to contact family members for collateral history. Most of the history obtained from the chart. [name] is a 54 y/o man with a history of polysubstance abuse including EtOH, tobacco, prior cocaine, as well as multiple past admissions for GI bleeds. In 2014 he was admitted with significant upper GI bleed due to a duodenal ulcer with exposed vessel - he had had bleeding from this site in the past, with embolization attempts, epi injections, and clips. He was offered surgery for resection, but declined. He did have some behavioral issues complicating prior hospitalizations, including leaving AMA. He continues to drink alcohol, but apparently has not been taking NSAIDs.  Hospital Course:  Patient is a 55 year old with a past medical  history of alcohol abuse, history recurrent upper GI bleed secondary to peptic ulcer disease, who was admitted to the pulmonary critical care service on 10/08/2014 when he presented with melena, generalized weakness, near syncope. Patient found to be in profoundly hypovolemic, was admitted to the intensive care unit and started on aggressive IV fluid resuscitation, IV Protonix and blood transfusion. Patient was seen and evaluated by Dr. Sharla Kidney of GI. Patient's recurrent bleeding having known chronic duodenal bulb ulcer and due to its size and location felt ulcer not amenable to endoscopic therapy. 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 Right gastric artery identified off the common hepatic artery. Selective injection showed gastric arterial supply to much of the distal stomach and body of the stomach along the lesser curvature. No cervical duodenal supply identified. On 10/09/2014 patient having hematemesis, a significant drop in his hemoglobin. He was transfused with packed red blood cells. Meanwhile GI and general surgery decided to proceed with intubation and EGD which was performed on 10/09/2014. This showed blood and stomach were no source of active bleeding in the stomach. Large ulcer again seen in the duodenal bulb with large visible vessel. Hemoclip placed at the base of vessel. On 10/10/2014 he underwent exploratory laparotomy with gastrorrhaphy and oversewn duodenal ulcer, a JP drain was left in place. Postoperatively he was placed on TPN. Patient remained hemodynamically stable was transferred to the floor on 10/14/2014. Physical therapy was consulted as initially to nursing facility placement was recommended. His diet was slowly advanced and by 10/19/2014 was tolerating  a regular diet. TPN was discontinued. On reevaluation by physical therapy he shows an active improvement  and was ambulating down the hallway without needing a walker. Case discussed with general surgery who cleared him for discharge from a surgical standpoint. Case manager was consulted to set patient up with home health services for wound care. Patient was discharged to his home in stable condition on 10/20/2014.  Procedures: 3/7 endoscopy with visible vessel, high risk for intervention 3/9 IR embolization could not identify a vessel 3/10 overnight passed bright red blood per rectum,  3/10: EGD. Endo-clipping of visible vessel w/in duodenal ulcer.  3/11 extubated. hgb drifted 1 gm over night.  3/11 Exploratory laparotomy, over sewing of duodenal ulcer and external over sewing of feeder vessels   Consultations:  Pulmonary critical care medicine  General surgery  Interventional radiology  Gastroenterology  Discharge Exam: Filed Vitals:   10/20/14 1327  BP: 136/83  Pulse: 86  Temp: 98.3 F (36.8 C)  Resp: 18    General: Patient is in no acute distress, ambulating down the hallway, reports feeling better, tolerating regular diet  Cardiovascular: S1-S2 normal. No murmurs. Pulse regular.  Respiratory: Good air entry bilaterally. No rhonchi or rales.  Abdomen: Surgical wound with clean dressing.  Musculoskeletal: No pedal edema   Neurological: Intact  Discharge Instructions   Discharge Instructions    Call MD for:  difficulty breathing, headache or visual disturbances    Complete by:  As directed      Call MD for:  extreme fatigue    Complete by:  As directed      Call MD for:  hives    Complete by:  As directed      Call MD for:  persistant dizziness or light-headedness    Complete by:  As directed      Call MD for:  persistant nausea and vomiting    Complete by:  As directed      Call MD for:  redness, tenderness, or signs of infection (pain, swelling, redness, odor or green/yellow discharge around incision site)    Complete by:  As directed      Call MD for:   severe uncontrolled pain    Complete by:  As directed      Call MD for:  temperature >100.4    Complete by:  As directed      Diet - low sodium heart healthy    Complete by:  As directed      Increase activity slowly    Complete by:  As directed           Current Discharge Medication List    START taking these medications   Details  acetaminophen (TYLENOL) 325 MG tablet Take 2 tablets (650 mg total) by mouth every 4 (four) hours as needed for mild pain, moderate pain, fever or headache. Qty: 30 tablet, Refills: 0    ferrous sulfate 325 (65 FE) MG tablet Take 1 tablet (325 mg total) by mouth daily after supper. Qty: 30 tablet, Refills: 3    pantoprazole (PROTONIX) 40 MG tablet Take 1 tablet (40 mg total) by mouth 2 (two) times daily. Qty: 60 tablet, Refills: 1    saccharomyces boulardii (FLORASTOR) 250 MG capsule Take 1 capsule (250 mg total) by mouth 2 (two) times daily. Qty: 60 capsule, Refills: 1      CONTINUE these medications which have CHANGED   Details  HYDROcodone-acetaminophen (NORCO/VICODIN) 5-325 MG per tablet Take 1 tablet by mouth every 4 (  four) hours as needed for moderate pain. Qty: 15 tablet, Refills: 0       Allergies  Allergen Reactions  . Nsaids Other (See Comments)    BLEEDING ULCER  . Chocolate Nausea And Vomiting  . Peanut-Containing Drug Products Nausea And Vomiting  . Spinach Nausea And Vomiting   Follow-up Information    Follow up with MARTIN,MATTHEW B, MD In 3 weeks.   Specialty:  General Surgery   Why:  To follow up after your operation, To follow up after your hospital stay   Contact information:   [address] [place] [address] [phone]       Follow up with OSEI-BONSU,GEORGE, MD In 1 week.   Specialty:  Internal Medicine   Contact information:   [address] [place] [address] [phone]        The results of significant diagnostics from this hospitalization (including imaging,  microbiology, ancillary and laboratory) are listed below for reference.    Significant Diagnostic Studies: Ir Angiogram Visceral Selective  10/08/2014   CLINICAL DATA:  Large ulcer of the duodenal bulb with bleeding and significant anemia requiring transfusion. History of prior ulceration in this area requiring arteriography and transcatheter embolization of the gastroduodenal artery in October, 2014.  EXAM: 1. ULTRASOUND GUIDANCE FOR VASCULAR ACCESS OF THE RIGHT COMMON FEMORAL ARTERY 2. FIRST ORDER SELECTIVE ARTERIOGRAPHY OF THE CELIAC AXIS 3. SECOND ORDER SELECTIVE ARTERIOGRAPHY OF THE COMMON HEPATIC ARTERY 4. THIRD ORDER SELECTIVE ARTERIOGRAPHY OF THE RIGHT GASTRIC ARTERY 5. FIRST ORDER SELECTIVE ARTERIOGRAPHY OF THE SUPERIOR MESENTERIC ARTERY  FLUOROSCOPY TIME:  10 minutes and 6 seconds.  MEDICATIONS AND MEDICAL HISTORY: 2.0 mg IV Versed; 75 mcg IV Fentanyl.  ANESTHESIA/SEDATION: Moderate sedation time: 46 minutes  CONTRAST:  82 ml Omnipaque-300  PROCEDURE: The procedure, risks, benefits, and alternatives were explained to the patient. Questions regarding the procedure were encouraged and answered. The patient understands and consents to the procedure.  The right groin was prepped with Betadine in a sterile fashion, and a sterile drape was applied covering the operative field. A sterile gown and sterile gloves were used for the procedure. Local anesthesia was provided with 1% Lidocaine. Prior to the procedure a timeout was performed. Ultrasound was used to confirm patency of the right common femoral artery.  Access of the right common femoral artery was performed under ultrasound guidance with a micropuncture set. Ultrasound image documentation was performed.A 5-French sheath was placed. A 5-French Cobra catheter was advanced and used to selectively catheterize the celiac axis. Selective arteriography was performed.  The Cobra catheter was further advanced into the common hepatic artery and selective  arteriography performed. A Renegade micro catheter was then advanced through the Cobra catheter a used to selectively catheterize the right gastric artery. Arteriography was performed.  The superior mesenteric artery was catheterized with a 5 [place] Sos catheter. Selective arteriography was performed.  The catheter was removed. Femoral puncture site was assessed with oblique arteriography. Arteriotomy closure was performed with the Cordis ExoSeal device.  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 A left gastric artery is identified which is patent and also supplies accessory left lobe hepatic supply. The splenic artery is normally patent.  A right gastric artery was identified emanating from the lateral aspect of the proper hepatic artery just beyond the gastroduodenal artery. This was catheterized and selective arteriography demonstrates supply to the mid and distal stomach along the  lesser curvature. No definite duodenal supply is identified and there is no evidence of arterial contrast extravasation or pseudoaneurysm. Based on distribution of right gastric artery supply as well as the level of the endoscopically confirmed duodenum bulb ulcer, it was felt that embolization of the right gastric artery would not be of benefit in controlling bleeding and would potentially make portions of the stomach ischemic.  Selective arteriography of the superior mesenteric artery demonstrates no evidence of significant pancreaticoduodenal branches supplying the duodenum. No arterial extravasation or pseudoaneurysm is identified. Branches supplying small bowel and the proximal colon appear normal.  Arteriotomy closure was initially successful in establishing hemostasis.  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 Patent left and right gastric arteries are  demonstrated. Selective arteriography of the right gastric artery demonstrated no significant duodenal supply. Embolization of the right gastric artery was not performed. No significant pancreaticoduodenal branches were identified from SMA supply.   Electronically Signed   By: Irish Lack M.D.   On: 10/08/2014 17:45   Ir Angiogram Visceral Selective  10/08/2014   CLINICAL DATA:  Large ulcer of the duodenal bulb with bleeding and significant anemia requiring transfusion. History of prior ulceration in this area requiring arteriography and transcatheter embolization of the gastroduodenal artery in October, 2014.  EXAM: 1. ULTRASOUND GUIDANCE FOR VASCULAR ACCESS OF THE RIGHT COMMON FEMORAL ARTERY 2. FIRST ORDER SELECTIVE ARTERIOGRAPHY OF THE CELIAC AXIS 3. SECOND ORDER SELECTIVE ARTERIOGRAPHY OF THE COMMON HEPATIC ARTERY 4. THIRD ORDER SELECTIVE ARTERIOGRAPHY OF THE RIGHT GASTRIC ARTERY 5. FIRST ORDER SELECTIVE ARTERIOGRAPHY OF THE SUPERIOR MESENTERIC ARTERY  FLUOROSCOPY TIME:  10 minutes and 6 seconds.  MEDICATIONS AND MEDICAL HISTORY: 2.0 mg IV Versed; 75 mcg IV Fentanyl.  ANESTHESIA/SEDATION: Moderate sedation time: 46 minutes  CONTRAST:  82 ml Omnipaque-300  PROCEDURE: The procedure, risks, benefits, and alternatives were explained to the patient. Questions regarding the procedure were encouraged and answered. The patient understands and consents to the procedure.  The right groin was prepped with Betadine in a sterile fashion, and a sterile drape was applied covering the operative field. A sterile gown and sterile gloves were used for the procedure. Local anesthesia was provided with 1% Lidocaine. Prior to the procedure a timeout was performed. Ultrasound was used to confirm patency of the right common femoral artery.  Access of the right common femoral artery was performed under ultrasound guidance with a micropuncture set. Ultrasound image documentation was performed.A 5-French sheath was placed. A  5-French Cobra catheter was advanced and used to selectively catheterize the celiac axis. Selective arteriography was performed.  The Cobra catheter was further advanced into the common hepatic artery and selective arteriography performed. A Renegade micro catheter was then advanced through the Cobra catheter a used to selectively catheterize the right gastric artery. Arteriography was performed.  The superior mesenteric artery was catheterized with a 5 [place] Sos catheter. Selective arteriography was performed.  The catheter was removed. Femoral puncture site was assessed with oblique arteriography. Arteriotomy closure was performed with the Cordis ExoSeal device.  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 A left gastric artery is identified which is patent and also supplies accessory left lobe hepatic supply. The splenic artery is normally patent.  A right gastric artery was identified emanating from the lateral aspect of the proper hepatic artery just beyond the gastroduodenal artery. This was catheterized and selective arteriography demonstrates supply to the mid  and distal stomach along the lesser curvature. No definite duodenal supply is identified and there is no evidence of arterial contrast extravasation or pseudoaneurysm. Based on distribution of right gastric artery supply as well as the level of the endoscopically confirmed duodenum bulb ulcer, it was felt that embolization of the right gastric artery would not be of benefit in controlling bleeding and would potentially make portions of the stomach ischemic.  Selective arteriography of the superior mesenteric artery demonstrates no evidence of significant pancreaticoduodenal branches supplying the duodenum. No arterial extravasation or pseudoaneurysm is identified. Branches supplying small bowel and the proximal colon appear normal.  Arteriotomy closure  was initially successful in establishing hemostasis.  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 Patent left and right gastric arteries are demonstrated. Selective arteriography of the right gastric artery demonstrated no significant duodenal supply. Embolization of the right gastric artery was not performed. No significant pancreaticoduodenal branches were identified from SMA supply.   Electronically Signed   By: Irish Lack M.D.   On: 10/08/2014 17:45   Ir Angiogram Selective Each Additional Vessel  10/08/2014   CLINICAL DATA:  Large ulcer of the duodenal bulb with bleeding and significant anemia requiring transfusion. History of prior ulceration in this area requiring arteriography and transcatheter embolization of the gastroduodenal artery in October, 2014.  EXAM: 1. ULTRASOUND GUIDANCE FOR VASCULAR ACCESS OF THE RIGHT COMMON FEMORAL ARTERY 2. FIRST ORDER SELECTIVE ARTERIOGRAPHY OF THE CELIAC AXIS 3. SECOND ORDER SELECTIVE ARTERIOGRAPHY OF THE COMMON HEPATIC ARTERY 4. THIRD ORDER SELECTIVE ARTERIOGRAPHY OF THE RIGHT GASTRIC ARTERY 5. FIRST ORDER SELECTIVE ARTERIOGRAPHY OF THE SUPERIOR MESENTERIC ARTERY  FLUOROSCOPY TIME:  10 minutes and 6 seconds.  MEDICATIONS AND MEDICAL HISTORY: 2.0 mg IV Versed; 75 mcg IV Fentanyl.  ANESTHESIA/SEDATION: Moderate sedation time: 46 minutes  CONTRAST:  82 ml Omnipaque-300  PROCEDURE: The procedure, risks, benefits, and alternatives were explained to the patient. Questions regarding the procedure were encouraged and answered. The patient understands and consents to the procedure.  The right groin was prepped with Betadine in a sterile fashion, and a sterile drape was applied covering the operative field. A sterile gown and sterile gloves were used for the procedure. Local anesthesia was provided with 1% Lidocaine. Prior to the procedure a timeout was performed. Ultrasound was used to confirm patency of the  right common femoral artery.  Access of the right common femoral artery was performed under ultrasound guidance with a micropuncture set. Ultrasound image documentation was performed.A 5-French sheath was placed. A 5-French Cobra catheter was advanced and used to selectively catheterize the celiac axis. Selective arteriography was performed.  The Cobra catheter was further advanced into the common hepatic artery and selective arteriography performed. A Renegade micro catheter was then advanced through the Cobra catheter a used to selectively catheterize the right gastric artery. Arteriography was performed.  The superior mesenteric artery was catheterized with a 5 [place] Sos catheter. Selective arteriography was performed.  The catheter was removed. Femoral puncture site was assessed with oblique arteriography. Arteriotomy closure was performed with the Cordis ExoSeal device.  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 A left gastric artery is identified which is patent and also supplies accessory left lobe hepatic supply. The splenic artery is normally patent.  A right gastric artery was identified emanating from the lateral aspect of the proper hepatic artery just beyond the gastroduodenal artery. This was catheterized and  selective arteriography demonstrates supply to the mid and distal stomach along the lesser curvature. No definite duodenal supply is identified and there is no evidence of arterial contrast extravasation or pseudoaneurysm. Based on distribution of right gastric artery supply as well as the level of the endoscopically confirmed duodenum bulb ulcer, it was felt that embolization of the right gastric artery would not be of benefit in controlling bleeding and would potentially make portions of the stomach ischemic.  Selective arteriography of the superior mesenteric artery demonstrates no evidence  of significant pancreaticoduodenal branches supplying the duodenum. No arterial extravasation or pseudoaneurysm is identified. Branches supplying small bowel and the proximal colon appear normal.  Arteriotomy closure was initially successful in establishing hemostasis.  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 Patent left and right gastric arteries are demonstrated. Selective arteriography of the right gastric artery demonstrated no significant duodenal supply. Embolization of the right gastric artery was not performed. No significant pancreaticoduodenal branches were identified from SMA supply.   Electronically Signed   By: Irish Lack M.D.   On: 10/08/2014 17:45   Ir Angiogram Selective Each Additional Vessel  10/08/2014   CLINICAL DATA:  Large ulcer of the duodenal bulb with bleeding and significant anemia requiring transfusion. History of prior ulceration in this area requiring arteriography and transcatheter embolization of the gastroduodenal artery in October, 2014.  EXAM: 1. ULTRASOUND GUIDANCE FOR VASCULAR ACCESS OF THE RIGHT COMMON FEMORAL ARTERY 2. FIRST ORDER SELECTIVE ARTERIOGRAPHY OF THE CELIAC AXIS 3. SECOND ORDER SELECTIVE ARTERIOGRAPHY OF THE COMMON HEPATIC ARTERY 4. THIRD ORDER SELECTIVE ARTERIOGRAPHY OF THE RIGHT GASTRIC ARTERY 5. FIRST ORDER SELECTIVE ARTERIOGRAPHY OF THE SUPERIOR MESENTERIC ARTERY  FLUOROSCOPY TIME:  10 minutes and 6 seconds.  MEDICATIONS AND MEDICAL HISTORY: 2.0 mg IV Versed; 75 mcg IV Fentanyl.  ANESTHESIA/SEDATION: Moderate sedation time: 46 minutes  CONTRAST:  82 ml Omnipaque-300  PROCEDURE: The procedure, risks, benefits, and alternatives were explained to the patient. Questions regarding the procedure were encouraged and answered. The patient understands and consents to the procedure.  The right groin was prepped with Betadine in a sterile fashion, and a sterile drape was applied covering the  operative field. A sterile gown and sterile gloves were used for the procedure. Local anesthesia was provided with 1% Lidocaine. Prior to the procedure a timeout was performed. Ultrasound was used to confirm patency of the right common femoral artery.  Access of the right common femoral artery was performed under ultrasound guidance with a micropuncture set. Ultrasound image documentation was performed.A 5-French sheath was placed. A 5-French Cobra catheter was advanced and used to selectively catheterize the celiac axis. Selective arteriography was performed.  The Cobra catheter was further advanced into the common hepatic artery and selective arteriography performed. A Renegade micro catheter was then advanced through the Cobra catheter a used to selectively catheterize the right gastric artery. Arteriography was performed.  The superior mesenteric artery was catheterized with a 5 [place] Sos catheter. Selective arteriography was performed.  The catheter was removed. Femoral puncture site was assessed with oblique arteriography. Arteriotomy closure was performed with the Cordis ExoSeal device.  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 A left gastric artery is identified which is patent and also supplies accessory left lobe hepatic supply. The splenic artery is normally patent.  A right gastric artery was identified emanating from the lateral aspect of the proper hepatic artery just beyond  the gastroduodenal artery. This was catheterized and selective arteriography demonstrates supply to the mid and distal stomach along the lesser curvature. No definite duodenal supply is identified and there is no evidence of arterial contrast extravasation or pseudoaneurysm. Based on distribution of right gastric artery supply as well as the level of the endoscopically confirmed duodenum bulb ulcer, it was felt that embolization  of the right gastric artery would not be of benefit in controlling bleeding and would potentially make portions of the stomach ischemic.  Selective arteriography of the superior mesenteric artery demonstrates no evidence of significant pancreaticoduodenal branches supplying the duodenum. No arterial extravasation or pseudoaneurysm is identified. Branches supplying small bowel and the proximal colon appear normal.  Arteriotomy closure was initially successful in establishing hemostasis.  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 Patent left and right gastric arteries are demonstrated. Selective arteriography of the right gastric artery demonstrated no significant duodenal supply. Embolization of the right gastric artery was not performed. No significant pancreaticoduodenal branches were identified from SMA supply.   Electronically Signed   By: Irish Lack M.D.   On: 10/08/2014 17:45   Ir US Guide Vasc Access Right  10/08/2014   CLINICAL DATA:  Large ulcer of the duodenal bulb with bleeding and significant anemia requiring transfusion. History of prior ulceration in this area requiring arteriography and transcatheter embolization of the gastroduodenal artery in October, 2014.  EXAM: 1. ULTRASOUND GUIDANCE FOR VASCULAR ACCESS OF THE RIGHT COMMON FEMORAL ARTERY 2. FIRST ORDER SELECTIVE ARTERIOGRAPHY OF THE CELIAC AXIS 3. SECOND ORDER SELECTIVE ARTERIOGRAPHY OF THE COMMON HEPATIC ARTERY 4. THIRD ORDER SELECTIVE ARTERIOGRAPHY OF THE RIGHT GASTRIC ARTERY 5. FIRST ORDER SELECTIVE ARTERIOGRAPHY OF THE SUPERIOR MESENTERIC ARTERY  FLUOROSCOPY TIME:  10 minutes and 6 seconds.  MEDICATIONS AND MEDICAL HISTORY: 2.0 mg IV Versed; 75 mcg IV Fentanyl.  ANESTHESIA/SEDATION: Moderate sedation time: 46 minutes  CONTRAST:  82 ml Omnipaque-300  PROCEDURE: The procedure, risks, benefits, and alternatives were explained to the patient. Questions regarding the  procedure were encouraged and answered. The patient understands and consents to the procedure.  The right groin was prepped with Betadine in a sterile fashion, and a sterile drape was applied covering the operative field. A sterile gown and sterile gloves were used for the procedure. Local anesthesia was provided with 1% Lidocaine. Prior to the procedure a timeout was performed. Ultrasound was used to confirm patency of the right common femoral artery.  Access of the right common femoral artery was performed under ultrasound guidance with a micropuncture set. Ultrasound image documentation was performed.A 5-French sheath was placed. A 5-French Cobra catheter was advanced and used to selectively catheterize the celiac axis. Selective arteriography was performed.  The Cobra catheter was further advanced into the common hepatic artery and selective arteriography performed. A Renegade micro catheter was then advanced through the Cobra catheter a used to selectively catheterize the right gastric artery. Arteriography was performed.  The superior mesenteric artery was catheterized with a 5 [place] Sos catheter. Selective arteriography was performed.  The catheter was removed. Femoral puncture site was assessed with oblique arteriography. Arteriotomy closure was performed with the Cordis ExoSeal device.  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 A left gastric artery is identified which is patent and also supplies accessory left lobe hepatic supply. The splenic artery is normally patent.  A right gastric artery was identified emanating from the lateral aspect  of the proper hepatic artery just beyond the gastroduodenal artery. This was catheterized and selective arteriography demonstrates supply to the mid and distal stomach along the lesser curvature. No definite duodenal supply is identified and there is no evidence of  arterial contrast extravasation or pseudoaneurysm. Based on distribution of right gastric artery supply as well as the level of the endoscopically confirmed duodenum bulb ulcer, it was felt that embolization of the right gastric artery would not be of benefit in controlling bleeding and would potentially make portions of the stomach ischemic.  Selective arteriography of the superior mesenteric artery demonstrates no evidence of significant pancreaticoduodenal branches supplying the duodenum. No arterial extravasation or pseudoaneurysm is identified. Branches supplying small bowel and the proximal colon appear normal.  Arteriotomy closure was initially successful in establishing hemostasis.  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 Patent left and right gastric arteries are demonstrated. Selective arteriography of the right gastric artery demonstrated no significant duodenal supply. Embolization of the right gastric artery was not performed. No significant pancreaticoduodenal branches were identified from SMA supply.   Electronically Signed   By: Irish Lack M.D.   On: 10/08/2014 17:45   Dg Chest Port 1 View  10/13/2014   CLINICAL DATA:  Acute respiratory failure  EXAM: PORTABLE CHEST - 1 VIEW  COMPARISON:  10/12/2014  FINDINGS: The endotracheal tube has been removed. The nasogastric tube extends into the stomach and off the inferior edge of the image. The right jugular central line extends into the low SVC.  Airspace opacities persist bilaterally without significant interval change.  IMPRESSION: Extubated.  Airspace opacities persist unchanged.   Electronically Signed   By: Ellery Plunk M.D.   On: 10/13/2014 05:55   Dg Chest Port 1 View  10/12/2014   CLINICAL DATA:  Fever air  EXAM: PORTABLE CHEST - 1 VIEW  COMPARISON:  Portable exam 0522 hr compared to 10/11/2014  FINDINGS: Tip of endotracheal tube projects 2.9 cm above carina.   Nasogastric tube extends into stomach.  RIGHT jugular central venous catheter tip projects over distal SVC.  Normal heart size, mediastinal contours and pulmonary vascularity.  Persistent RIGHT basilar atelectasis.  Peribronchial thickening diffuse BILATERAL interstitial infiltrates little changed.  Slightly increased atelectasis versus consolidation in retrocardiac LEFT lower lobe.  LEFT apical scarring and noted.  No gross pleural effusion or pneumothorax.  IMPRESSION: Persistent pulmonary infiltrates with persistent subsegmental atelectasis at RIGHT lung base and slightly increased atelectasis versus consolidation in LEFT lower lobe.   Electronically Signed   By: Ulyses Southward M.D.   On: 10/12/2014 07:17   Portable Chest Xray  10/11/2014   CLINICAL DATA:  Respiratory failure, smoker  EXAM: PORTABLE CHEST - 1 VIEW  COMPARISON:  Portable exam 0544 hr compared to 10/10/2014  FINDINGS: Tip of endotracheal tube projects 3.7 cm above carina.  Nasogastric tube extends into stomach.  RIGHT jugular central venous catheter tip projects over SVC near cavoatrial junction.  Normal heart size and mediastinal contours.  Persistent BILATERAL interstitial infiltrates.  Mild RIGHT basilar atelectasis and LEFT apical pleuroparenchymal scarring present.  No pneumothorax or acute osseous findings.  IMPRESSION: Persistent diffuse interstitial infiltrates with slightly increased RIGHT basilar atelectasis.   Electronically Signed   By: Ulyses Southward M.D.   On: 10/11/2014 09:09   Portable Chest Xray  10/10/2014   CLINICAL DATA:  Respiratory failure  EXAM: PORTABLE CHEST - 1 VIEW  COMPARISON:  Portable exam 1636 hours compared to 10/10/2014 at  0426 hours  FINDINGS: Tip of endotracheal tube projects 4.4 cm above carinal.  RIGHT jugular central venous catheter tip projects over SVC.  Tip of nasogastric tube projects over stomach.  Stable heart size and mediastinal contours.  Pleuroparenchymal scarring at LEFT apex unchanged.  Diffuse  interstitial infiltrates increased since previous exam which could represent infection or edema.  No gross pleural effusion or pneumothorax.  IMPRESSION: Increased pulmonary infiltrates bilaterally question edema versus infection.  LEFT apical pleuroparenchymal scarring.   Electronically Signed   By: Ulyses Southward M.D.   On: 10/10/2014 16:57   Portable Chest Xray  10/10/2014   CLINICAL DATA:  Respiratory failure.  EXAM: PORTABLE CHEST - 1 VIEW  COMPARISON:  10/09/2014.  08/09/2012 .  FINDINGS: Tracheostomy tube, right IJ line in stable position. Mediastinal structures are stable. Stable left apical pleural-parenchymal thickening consistent with scarring. Mild left base subsegmental atelectasis. No acute infiltrate. Heart size normal. No pneumothorax. No acute osseus abnormality.  IMPRESSION: 1. Line and tube positions are stable. 2. Left apical pleural-parenchymal thickening consistent with scarring. Minimal left base subsegmental atelectasis.   Electronically Signed   By: Maisie Fus  Register   On: 10/10/2014 07:28   Dg Chest Port 1 View  10/09/2014   CLINICAL DATA:  Evaluate central line and endotracheal tube placement.  EXAM: PORTABLE CHEST - 1 VIEW  COMPARISON:  10/05/2014  FINDINGS: Endotracheal tube terminates 5.1 cm above carina. Right internal jugular line terminates at the low SVC.  Midline trachea. Normal heart size. No pleural effusion or pneumothorax. Pleural-parenchymal opacity in the left apex is similar back to 08/09/2012, favored to represent scarring. Mild interstitial thickening. No new consolidation.  IMPRESSION: Appropriate position of endotracheal and right internal jugular lines.  Chronic left apical pleural-parenchymal opacity, most consistent with scarring.  Peribronchial thickening which may relate to chronic bronchitis or smoking.   Electronically Signed   By: Jeronimo Greaves M.D.   On: 10/09/2014 14:24   Dg Chest Port 1 View  10/05/2014   CLINICAL DATA:  Hypotension and near syncope  EXAM:  PORTABLE CHEST - 1 VIEW  COMPARISON:  05/27/2013  FINDINGS: Left apical pleural-parenchymal scarring is unchanged. Remaining lungs are clear. Negative for pneumonia or heart failure.  IMPRESSION: No active disease.   Electronically Signed   By: Marlan Palau M.D.   On: 10/05/2014 20:40    Microbiology: Recent Results (from the past 240 hour(s))  Culture, respiratory (NON-Expectorated)     Status: None   Collection Time: 10/11/14 10:45 AM  Result Value Ref Range Status   Specimen Description TRACHEAL ASPIRATE  Final   Special Requests NONE  Final   Gram Stain   Final    ABUNDANT WBC PRESENT, PREDOMINANTLY PMN NO SQUAMOUS EPITHELIAL CELLS SEEN NO ORGANISMS SEEN Performed at [facility]    Culture   Final    Non-Pathogenic Oropharyngeal-type Flora Isolated. Performed at [facility]    Report Status 10/14/2014 FINAL  Final     Labs: Basic Metabolic Panel:  Recent Labs Lab 10/14/14 0410 10/15/14 0300 10/16/14 0329 10/17/14 0435 10/20/14 0348  NA 141 142 140 142 136  K 3.6 3.4* 3.9 4.0 3.7  CL 106 103 103 103 106  CO2 32 34* 33* 31 27  GLUCOSE 151* 113* 113* 119* 125*  BUN 11 12 14 15 16   CREATININE 0.80 0.76 0.86 0.90 1.05  CALCIUM 7.5* 7.6* 7.5* 7.9* 7.5*  MG 1.9  --  2.1  --  2.0  PHOS 3.9  --  4.0  --  4.0   Liver Function Tests:  Recent Labs Lab 10/16/14 0329 10/20/14 0348  AST 32 28  ALT 22 42  ALKPHOS 74 94  BILITOT 0.7 0.3  PROT 4.4* 5.2*  ALBUMIN 1.6* 1.8*   No results for input(s): LIPASE, AMYLASE in the last 168 hours. No results for input(s): AMMONIA in the last 168 hours. CBC:  Recent Labs Lab 10/13/14 2225 10/15/14 0300 10/16/14 0329 10/17/14 0435 10/20/14 0348  WBC 10.2 8.5 9.3 10.9* 11.4*  NEUTROABS  --   --   --   --  8.5*  HGB 8.1* 8.0* 8.1* 8.5* 7.0*  HCT 24.3* 23.7* 24.1* 25.9* 21.3*  MCV 87.4 85.9 86.1 86.0 85.2  PLT 165 192 263 324 320   Cardiac Enzymes: No results for input(s): CKTOTAL, CKMB, CKMBINDEX,  TROPONINI in the last 168 hours. BNP: BNP (last 3 results) No results for input(s): BNP in the last 8760 hours.  ProBNP (last 3 results) No results for input(s): PROBNP in the last 8760 hours.  CBG:  Recent Labs Lab 10/19/14 0301 10/19/14 1238 10/19/14 2211 10/20/14 0537 10/20/14 1455  GLUCAP 121* 107* 102* 113* 84       Signed:  Andreka Stucki  [facility] 10/20/2014, 3:31 PM

## 2014-10-20 NOTE — Progress Notes (Signed)
CARE MANAGEMENT NOTE 10/20/2014  Patient:  [name],[name]   Account Number:  [id number]  Date Initiated:  10/09/2014  Documentation initiated by:  DAVIS,RHONDA  Subjective/Objective Assessment:   gi bleed with hgb 5.2 and 6.3 s/p 3 units of prbc hx of etoh and substance abuse.     Action/Plan:   home when stable   Anticipated DC Date:  10/16/2014   Anticipated DC Plan:  HOME W HOME HEALTH SERVICES  In-house referral  Clinical Social Worker      DC Associate Professorlanning Services  CM consult      [facility]   Choice offered to / List presented to:  [name]   DME arranged  NA      DME agency  NA     HH arranged  HH-1 RN      [facility]   Status of service:  Completed, signed off Medicare Important Message given?   (If response is "NO", the following Medicare IM given date fields will be blank) Date Medicare IM given:   Medicare IM given by:   Date Additional Medicare IM given:   Additional Medicare IM given by:    Discharge Disposition:    Per UR Regulation:  Reviewed for med. necessity/level of care/duration of stay  If discussed at Long Length of Stay Meetings, dates discussed:    Comments:  10/20/14 Ryan CavaAndrea Schettino RN BSn CM [phone] [facility]. Asked this CM to speak with his friend [name] and explain the plan to her which this CM did. Contacted [facility] [name] and [facility]  10/20/14 Ryan CavaAndrea Schettino RN BSN CM [phone] Patient stated that he has 2 roommates, he has no DME but is planning on going to a facility for rehab prior to returning home. Patient stated that he did his own dressings last time and can do them again this time. Will continue to follow for dc planning needs.  1.  Recurrent duodenal bleed; s/p oversewing of duodenal ulcer 08/10/12, and embolization of the gastroduodenal artery 05/03/13. 1A.  Exploratory laparotomy, gastrorrhaphy, over sewing of  duodenal ulcer and external over sewing of feeder vessels to be devascularized, 10/10/2014, Ryan ParkMatthew B. Daphine DeutscherMartin, MD.  [phone]/Rhonda Davis,RN,BSN,CCM

## 2014-10-25 ENCOUNTER — Emergency Department (HOSPITAL_COMMUNITY): Payer: Medicaid Other

## 2014-10-25 ENCOUNTER — Emergency Department (HOSPITAL_COMMUNITY)
Admission: EM | Admit: 2014-10-25 | Discharge: 2014-10-26 | Disposition: A | Discharge status: Home / Self Care | Payer: Medicaid Other | Attending: Emergency Medicine | Admitting: Emergency Medicine

## 2014-10-25 ENCOUNTER — Encounter (HOSPITAL_COMMUNITY): Payer: Self-pay | Admitting: Emergency Medicine

## 2014-10-25 DIAGNOSIS — R109 Unspecified abdominal pain: Secondary | ICD-10-CM

## 2014-10-25 DIAGNOSIS — Z9889 Other specified postprocedural states: Secondary | ICD-10-CM | POA: Insufficient documentation

## 2014-10-25 DIAGNOSIS — Z79899 Other long term (current) drug therapy: Secondary | ICD-10-CM | POA: Diagnosis not present

## 2014-10-25 DIAGNOSIS — K219 Gastro-esophageal reflux disease without esophagitis: Secondary | ICD-10-CM | POA: Insufficient documentation

## 2014-10-25 DIAGNOSIS — Z720 Tobacco use: Secondary | ICD-10-CM | POA: Insufficient documentation

## 2014-10-25 DIAGNOSIS — R1084 Generalized abdominal pain: Secondary | ICD-10-CM | POA: Diagnosis present

## 2014-10-25 LAB — COMPREHENSIVE METABOLIC PANEL
ALK PHOS: 93 U/L (ref 39–117)
ALT: 32 U/L (ref 0–53)
ANION GAP: 6 (ref 5–15)
AST: 31 U/L (ref 0–37)
Albumin: 2.5 g/dL — ABNORMAL LOW (ref 3.5–5.2)
BILIRUBIN TOTAL: 0.5 mg/dL (ref 0.3–1.2)
BUN: 9 mg/dL (ref 6–23)
CHLORIDE: 106 mmol/L (ref 96–112)
CO2: 29 mmol/L (ref 19–32)
Calcium: 8.1 mg/dL — ABNORMAL LOW (ref 8.4–10.5)
Creatinine, Ser: 0.83 mg/dL (ref 0.50–1.35)
GFR calc non Af Amer: >90 mL/min (ref >90)
Glucose, Bld: 103 mg/dL — ABNORMAL HIGH (ref 70–99)
Potassium: 3.9 mmol/L (ref 3.5–5.1)
Sodium: 141 mmol/L (ref 135–145)
Total Protein: 6.1 g/dL (ref 6.0–8.3)

## 2014-10-25 LAB — I-STAT CHEM 8, ED
BUN: 6 mg/dL (ref 6–23)
CHLORIDE: 103 mmol/L (ref 96–112)
Calcium, Ion: 1.13 mmol/L (ref 1.12–1.23)
Creatinine, Ser: 0.8 mg/dL (ref 0.50–1.35)
GLUCOSE: 104 mg/dL — AB (ref 70–99)
HEMATOCRIT: 29 % — AB (ref 39.0–52.0)
Hemoglobin: 9.9 g/dL — ABNORMAL LOW (ref 13.0–17.0)
Potassium: 3.7 mmol/L (ref 3.5–5.1)
Sodium: 139 mmol/L (ref 135–145)
TCO2: 24 mmol/L (ref 0–100)

## 2014-10-25 LAB — LIPASE, BLOOD: Lipase: 26 U/L (ref 11–59)

## 2014-10-25 MED ORDER — SODIUM CHLORIDE 0.9 % IV BOLUS (SEPSIS)
1000.0000 mL | Freq: Once | INTRAVENOUS | Status: AC
  Administered 2014-10-25: 1000 mL via INTRAVENOUS

## 2014-10-25 MED ORDER — ONDANSETRON 4 MG PO TBDP: 4.0000 mg | ORAL_TABLET | Freq: Four times a day (QID) | ORAL | Status: DC | PRN

## 2014-10-25 MED ORDER — HYDROMORPHONE HCL 1 MG/ML IJ SOLN
1.0000 mg | Freq: Once | INTRAMUSCULAR | Status: AC
Start: 2014-10-25 — End: 2014-10-25
  Administered 2014-10-25: 1 mg via INTRAVENOUS
  Filled 2014-10-25: qty 1

## 2014-10-25 MED ORDER — IOHEXOL 300 MG/ML  SOLN
50.0000 mL | Freq: Once | INTRAMUSCULAR | Status: AC | PRN
  Administered 2014-10-25: 50 mL via ORAL

## 2014-10-25 MED ORDER — GI COCKTAIL ~~LOC~~
30.0000 mL | Freq: Once | ORAL | Status: AC
  Administered 2014-10-25: 30 mL via ORAL
  Filled 2014-10-25: qty 30

## 2014-10-25 MED ORDER — ONDANSETRON HCL 4 MG/2ML IJ SOLN
4.0000 mg | Freq: Once | INTRAMUSCULAR | Status: DC
  Filled 2014-10-25: qty 2

## 2014-10-25 MED ORDER — PANTOPRAZOLE SODIUM 40 MG PO TBEC: 40.0000 mg | DELAYED_RELEASE_TABLET | Freq: Two times a day (BID) | ORAL | Status: DC

## 2014-10-25 MED ORDER — HYDROMORPHONE HCL 1 MG/ML IJ SOLN
1.0000 mg | Freq: Once | INTRAMUSCULAR | Status: AC
  Administered 2014-10-25: 1 mg via INTRAVENOUS
  Filled 2014-10-25: qty 1

## 2014-10-25 MED ORDER — PANTOPRAZOLE SODIUM 40 MG PO TBEC
40.0000 mg | DELAYED_RELEASE_TABLET | Freq: Once | ORAL | Status: AC
  Administered 2014-10-25: 40 mg via ORAL
  Filled 2014-10-25: qty 1

## 2014-10-25 MED ORDER — ONDANSETRON HCL 4 MG/2ML IJ SOLN
4.0000 mg | Freq: Once | INTRAMUSCULAR | Status: AC
  Administered 2014-10-25: 4 mg via INTRAVENOUS

## 2014-10-25 MED ORDER — IOHEXOL 300 MG/ML  SOLN
100.0000 mL | Freq: Once | INTRAMUSCULAR | Status: AC | PRN
  Administered 2014-10-25: 100 mL via INTRAVENOUS

## 2014-10-25 NOTE — ED Provider Notes (Signed)
CSN: [id number]     Arrival date & time 10/25/14  1747 History   First MD Initiated Contact with Patient 10/25/14 1756     Chief Complaint  Patient presents with  . Abdominal Pain     (Consider location/radiation/quality/duration/timing/severity/associated sxs/prior Treatment) Patient is a 54 y.o. male presenting with abdominal pain. The history is provided by the patient.  Abdominal Pain Pain location:  Generalized Pain quality: aching   Pain radiates to:  Does not radiate Pain severity:  Mild Onset quality:  Sudden Timing:  Constant Progression:  Improving Chronicity:  New Context: suspicious food intake (ate peanuts, hx of similar GI upset when eating peanuts)   Context: not trauma   Relieved by:  Nothing Worsened by:  Nothing tried Associated symptoms: no cough, no fever, no nausea, no shortness of breath and no vomiting     Past Medical History  Diagnosis Date  . Abdominal pain, other specified site   . GERD (gastroesophageal reflux disease)   . Abnormal CT scan, esophagus 03/2012  . Substance abuse 03/2012    tox screen positive for cocaine.    Past Surgical History  Procedure Laterality Date  . Undescended testicle      on right. noted on 03/2012 CT scan.  no surgery referral.   . Esophagogastroduodenoscopy  08/10/2012    Procedure: ESOPHAGOGASTRODUODENOSCOPY (EGD);  Surgeon: [name], MD;  Location: [facility] ENDOSCOPY;  Service: Endoscopy;  Laterality: N/A;  will be done in room 1   . Laparotomy  08/10/2012    Procedure: EXPLORATORY LAPAROTOMY;  Surgeon: [name], MD;  [facility] OR;  Service: General;  Laterality: N/A;  . Esophagogastroduodenoscopy Left 05/01/2013    Procedure: ESOPHAGOGASTRODUODENOSCOPY (EGD);  Surgeon: [name], MD;  Location: [facility] ENDOSCOPY;  Service: Endoscopy;  Laterality: Left;  . Esophagogastroduodenoscopy N/A 05/11/2013    Procedure: ESOPHAGOGASTRODUODENOSCOPY (EGD);  Surgeon: [name], MD;  Location: [facility] ENDOSCOPY;  Service:  Endoscopy;  Laterality: N/A;  . Esophagogastroduodenoscopy N/A 10/06/2014    Procedure: ESOPHAGOGASTRODUODENOSCOPY (EGD);  Surgeon: [name], MD;  Location: [facility] ENDOSCOPY;  Service: Endoscopy;  Laterality: N/A;  . Esophagogastroduodenoscopy N/A 10/09/2014    Procedure: ESOPHAGOGASTRODUODENOSCOPY (EGD);  Surgeon: [name], MD;  Location: [facility] ENDOSCOPY;  Service: Gastroenterology;  Laterality: N/A;  Bedside  . Laparotomy N/A 10/10/2014    Procedure: EXPLORATORY LAPAROTOMY WITH GASTROPATHY AND OVER SEWEN DUODENAL ULCER REGION ;  Surgeon: [name], MD;  Location: [facility] ORS;  Service: General;  Laterality: N/A;   History reviewed. No pertinent family history. History  Substance Use Topics  . Smoking status: Light Tobacco Smoker -- 1.00 packs/day for 34 years  . Smokeless tobacco: Never Used  . Alcohol Use: Yes     Comment: 40oz beer/day    Review of Systems  Constitutional: Negative for fever.  Respiratory: Negative for cough and shortness of breath.   Gastrointestinal: Positive for abdominal pain. Negative for nausea and vomiting.  All other systems reviewed and are negative.     Allergies  Nsaids; Chocolate; Peanut-containing drug products; and Spinach  Home Medications   Prior to Admission medications   Medication Sig Start Date End Date Taking? Authorizing Provider  acetaminophen (TYLENOL) 325 MG tablet Take 2 tablets (650 mg total) by mouth every 4 (four) hours as needed for mild pain, moderate pain, fever or headache. 10/20/14  Yes Jeralyn Bennett, MD  HYDROcodone-acetaminophen (NORCO/VICODIN) 5-325 MG per tablet Take 1 tablet by mouth every 4 (four) hours as needed for moderate pain. 10/20/14  Yes Jeralyn BennettEzequiel Zamora, MD  pantoprazole (PROTONIX) 40 MG tablet Take 1 tablet (40 mg total) by mouth 2 (two) times daily. 10/20/14  Yes Jeralyn BennettEzequiel Zamora, MD  saccharomyces boulardii (FLORASTOR) 250 MG capsule Take 1 capsule (250 mg total) by mouth 2 (two) times daily. 10/20/14  Yes Jeralyn BennettEzequiel  Zamora, MD  ferrous sulfate 325 (65 FE) MG tablet Take 1 tablet (325 mg total) by mouth daily after supper. 10/20/14   Jeralyn BennettEzequiel Zamora, MD   BP 157/99 mmHg  Pulse 81  Temp(Src) 97.9 F (36.6 C) (Oral)  Resp 14  SpO2 99% Physical Exam  Constitutional: He is oriented to person, place, and time. He appears well-developed and well-nourished. No distress.  HENT:  Head: Normocephalic and atraumatic.  Mouth/Throat: No oropharyngeal exudate.  Eyes: EOM are normal. Pupils are equal, round, and reactive to light.  Neck: Normal range of motion. Neck supple.  Cardiovascular: Normal rate and regular rhythm.  Exam reveals no friction rub.   No murmur heard. Pulmonary/Chest: Effort normal and breath sounds normal. No respiratory distress. He has no wheezes. He has no rales.  Abdominal: He exhibits no distension. There is tenderness (diffuse). There is no rebound.  Midline surgical incision with mild skin dehiscence but subcutaneous tissues are together. No drainage from the wound. No cellulitis.  Musculoskeletal: Normal range of motion. He exhibits no edema.  Neurological: He is alert and oriented to person, place, and time.  Skin: He is not diaphoretic.  Nursing note and vitals reviewed.   ED Course  Procedures (including critical care time) Labs Review Labs Reviewed  CBC  BASIC METABOLIC PANEL    Imaging Review Ct Abdomen Pelvis W Contrast  10/25/2014   CLINICAL DATA:  Sharp abdominal pain eating peanuts today. Peanut allergy. History of duodenal ulcers.  EXAM: CT ABDOMEN AND PELVIS WITH CONTRAST  TECHNIQUE: Multidetector CT imaging of the abdomen and pelvis was performed using the standard protocol following bolus administration of intravenous contrast.  CONTRAST:  50mL OMNIPAQUE IOHEXOL 300 MG/ML SOLN, 100mL OMNIPAQUE IOHEXOL 300 MG/ML SOLN  COMPARISON:  08/22/2012  FINDINGS: Tree-in-bud nodular ground-glass opacities noted in the left lower lung, new since prior study. No effusions. Heart  is normal size. Scarring in the lingula, stable.  Liver, gallbladder, spleen, pancreas, adrenals and kidneys are normal.  Metallic coils are noted in the right upper quadrant from prior embolization. Adjacent to the coils, the proximal duodenum and distal stomach appear thick walled with surrounding haziness. Small gas collection noted adjacent to the coils. This is concerning for gas with end in ulcer, likely within the proximal duodenum. No free air.  Remainder of the small bowel is unremarkable. Moderate stool throughout the colon which is otherwise unremarkable. No free fluid or adenopathy. Aorta is normal caliber.  No acute bony abnormality or focal bone lesion.  IMPRESSION: Wall thickening in the region of the distal stomach and proximal duodenum with surrounding haziness and indistinctness. Air collection noted adjacent to coils within the right upper quadrant may be within a proximal duodenal ulcer. No free air.   Electronically Signed   By: Charlett NoseKevin  Dover M.D.   On: 10/25/2014 21:30     EKG Interpretation None      MDM   Final diagnoses:  Abdominal pain    54 year old male here with belly pain. He ate some peanuts and is now having abdominal pain. The pain is improving. States when he normally eats peanuts he gets abdominal pain. He had a recent exploratory laparoscopy for duodenal ulcer bleeding.  Here surgical incision looks well, no swelling cellulitis. There is mild dehiscence of the scan but the subcutaneous tissues are together. There is no purulence in the wound. He states the pain is migratory all over the abdomen but he is tender on exam. We'll rescan.  Scan shows some indistinct haziness over stomach. No free air or other acute process. I spoke to Dr. Michaell Cowing about this - he states likely post-operative. He recommends to ensure he's on PPI therapy and abstaining from alcohol.  Patient reports he's not been drinking, but he hasn't been compliant with meds - has had no PPI. Will  give GI cocktail, PPI, pain meds here. He stated he has pain meds at home. On re-exam after scan, he states he's feeling better. He does endorse having bowel movements.  Given Protonix, zofran to go home.  Elwin Mocha, MD 10/25/14 [phone]

## 2014-10-25 NOTE — Discharge Instructions (Signed)

## 2014-10-25 NOTE — ED Notes (Signed)
Bed: WA03 Expected date: 10/25/14 Expected time: 5:44 PM Means of arrival: Ambulance Comments: Abd Pain

## 2014-10-25 NOTE — ED Notes (Signed)
[place] with abd pain after eating peanuts at 11am. Denies n/v/d.

## 2014-10-26 NOTE — ED Notes (Signed)
Ambulatory with steady gait. Given two bus tickets. Explained x4 to patient how to take Protonix and Zofran-indications explained. Advised to attempt clear liquid diet and to stay away from irritating foods. Knows to follow up with surgery if symptoms (abdominal drainage worsens) persist. Has appointment in 3 weeks. Given gauze, saline and abdominal pads-shown how to dress wound and knows to keep clean and dry. No other c/c.

## 2014-11-05 NOTE — Discharge Summary (Signed)
Physician Discharge Summary       Patient ID: [name] MRN: [id number] DOB/AGE: 54/03/1961 54 y.o.  Admit date: 10/05/2014 Discharge date: 10/07/2014  Discharge Diagnoses:   Hemorrhagic shock Acute GI bleed Mild Hyperchloremia  Acute Blood Loss Anemia  H/o ETOH abuse  Left against medical advice.   Detailed Hospital Course:   54 y/o man with a history of polysubstance abuse including EtOH, tobacco, prior cocaine, as well as multiple past admissions for GI bleeds. In 2014 he was admitted with significant upper GI bleed due to a duodenal ulcer with exposed vessel - he had had bleeding from this site in the past, with embolization attempts, epi injections, and clips. He was offered surgery for resection, but declined. He did have some behavioral issues complicating prior hospitalizations, including leaving AMA. He continues to drink alcohol, but apparently has not been taking NSAIDs. Was admitted to the intensive care. Underwent EGD on 3/7 which found a large visible ulcer in the duodenal bulb with a visible vessel, but no active bleeding. His Hgb dropped so interventional radiology was consulted. Unfortunately he left Against medical advice on 3/8.    Discharge Plan by active problems  No discharge plan was reviewed as pt left against medical advice and was verbally abusive to staff.   Significant Hospital tests/ studies  Consults  GI Interventional Radiology General surgery   Discharge Exam: BP 102/59 mmHg  Pulse 169  Temp(Src) 98.8 F (37.1 C) (Oral)  Resp 19  Ht 5\' 9"  (1.753 m)  Wt 68.3 kg (150 lb 9.2 oz)  BMI 22.23 kg/m2  SpO2 99%  General: No distress Neuro: normal strength HEENT: No sinus tenderness Cardiovascular: regular Lungs: no wheeze Abdomen: Soft, non-tender. Musculoskeletal: no edema Skin: No rashes.  Labs at discharge Lab Results  Component Value Date   CREATININE 0.80 10/25/2014   BUN 6 10/25/2014   NA 139 10/25/2014   K 3.7 10/25/2014   CL  103 10/25/2014   CO2 29 10/25/2014   Lab Results  Component Value Date   WBC 11.4* 10/20/2014   HGB 9.9* 10/25/2014   HCT 29.0* 10/25/2014   MCV 85.2 10/20/2014   PLT 320 10/20/2014   Lab Results  Component Value Date   ALT 32 10/25/2014   AST 31 10/25/2014   ALKPHOS 93 10/25/2014   BILITOT 0.5 10/25/2014   Lab Results  Component Value Date   INR 1.54* 10/10/2014   INR 1.14 10/08/2014   INR 1.17 10/06/2014    Current radiology studies No results found.  Disposition:  01-Home or Self Care     Medication List    Notice    You have not been prescribed any medications.       Discharged Condition: not cleared medically. Left AMA   Physician Statement:   The Patient was personally examined, the discharge assessment and plan has been personally reviewed and I agree with ACNP Tajana Crotteau's assessment and plan. > 30 minutes of time have been dedicated to discharge assessment, planning and discharge instructions.   Signed: Jarvis Sawa,PETE 11/05/2014, 9:35 AM

## 2014-11-21 LAB — PREALBUMIN: Prealbumin: 15 mg/dL — ABNORMAL LOW (ref 18.0–45.0)

## 2015-06-18 ENCOUNTER — Encounter (HOSPITAL_COMMUNITY): Payer: Self-pay

## 2015-06-18 ENCOUNTER — Emergency Department (HOSPITAL_COMMUNITY): Payer: Medicaid Other

## 2015-06-18 ENCOUNTER — Emergency Department (HOSPITAL_COMMUNITY)
Admission: EM | Admit: 2015-06-18 | Discharge: 2015-06-19 | Disposition: A | Discharge status: Home / Self Care | Payer: Medicaid Other | Attending: Emergency Medicine | Admitting: Emergency Medicine

## 2015-06-18 DIAGNOSIS — R1031 Right lower quadrant pain: Secondary | ICD-10-CM | POA: Diagnosis not present

## 2015-06-18 DIAGNOSIS — R1032 Left lower quadrant pain: Secondary | ICD-10-CM | POA: Diagnosis not present

## 2015-06-18 DIAGNOSIS — Z79899 Other long term (current) drug therapy: Secondary | ICD-10-CM | POA: Diagnosis not present

## 2015-06-18 DIAGNOSIS — R112 Nausea with vomiting, unspecified: Secondary | ICD-10-CM | POA: Diagnosis not present

## 2015-06-18 DIAGNOSIS — F17200 Nicotine dependence, unspecified, uncomplicated: Secondary | ICD-10-CM | POA: Diagnosis not present

## 2015-06-18 DIAGNOSIS — K219 Gastro-esophageal reflux disease without esophagitis: Secondary | ICD-10-CM | POA: Diagnosis not present

## 2015-06-18 LAB — ETHANOL: Alcohol, Ethyl (B): <5 mg/dL (ref <5)

## 2015-06-18 LAB — CBC WITH DIFFERENTIAL/PLATELET
Basophils Absolute: 0 10*3/uL (ref 0.0–0.1)
Basophils Relative: 0 %
Eosinophils Absolute: 0.1 10*3/uL (ref 0.0–0.7)
Eosinophils Relative: 1 %
HEMATOCRIT: 36.6 % — AB (ref 39.0–52.0)
Hemoglobin: 11.6 g/dL — ABNORMAL LOW (ref 13.0–17.0)
LYMPHS PCT: 15 %
Lymphs Abs: 1.3 10*3/uL (ref 0.7–4.0)
MCH: 24.3 pg — ABNORMAL LOW (ref 26.0–34.0)
MCHC: 31.7 g/dL (ref 30.0–36.0)
MCV: 76.6 fL — AB (ref 78.0–100.0)
MONO ABS: 0.9 10*3/uL (ref 0.1–1.0)
Monocytes Relative: 10 %
Neutro Abs: 6.3 10*3/uL (ref 1.7–7.7)
Neutrophils Relative %: 74 %
Platelets: 197 10*3/uL (ref 150–400)
RBC: 4.78 MIL/uL (ref 4.22–5.81)
RDW: 14.9 % (ref 11.5–15.5)
WBC: 8.5 10*3/uL (ref 4.0–10.5)

## 2015-06-18 MED ORDER — MORPHINE SULFATE (PF) 4 MG/ML IV SOLN
4.0000 mg | Freq: Once | INTRAVENOUS | Status: AC
  Administered 2015-06-19: 4 mg via INTRAVENOUS
  Filled 2015-06-18: qty 1

## 2015-06-18 MED ORDER — SODIUM CHLORIDE 0.9 % IV BOLUS (SEPSIS)
1000.0000 mL | Freq: Once | INTRAVENOUS | Status: AC
  Administered 2015-06-19: 1000 mL via INTRAVENOUS

## 2015-06-18 MED ORDER — SODIUM CHLORIDE 0.9 % IV BOLUS (SEPSIS)
500.0000 mL | Freq: Once | INTRAVENOUS | Status: AC
  Administered 2015-06-18: 500 mL via INTRAVENOUS

## 2015-06-18 MED ORDER — ONDANSETRON HCL 4 MG/2ML IJ SOLN
4.0000 mg | Freq: Once | INTRAMUSCULAR | Status: AC
  Administered 2015-06-19: 4 mg via INTRAVENOUS
  Filled 2015-06-18: qty 2

## 2015-06-18 NOTE — ED Notes (Signed)
Pt complains of abd pain for two days, has eaten hamburgers and hotdogs and drank malt liquor, may have pancreatitis, pt knows he's not suppose to eat this stuff or drink.

## 2015-06-18 NOTE — ED Notes (Signed)
Bed: [facility] Expected date: 06/18/15 Expected time: 8:31 PM Means of arrival: Ambulance Comments: 54 yo M  abd pain

## 2015-06-18 NOTE — ED Provider Notes (Signed)
CSN: [id number]     Arrival date & time 06/18/15  2041 History   First MD Initiated Contact with Patient 06/18/15 2246     Chief Complaint  Patient presents with  . Abdominal Pain   [name] is a 54 y.o. male with a history of the gastric ulcer bleed with repair, and alcohol abuse who presents to the emergency department complaining of abdominal pain for the past 2 days. He complains of right lower quadrant abdominal pain that radiates to his right flank and can fluctuate in intensity and move. He reports he's vomited twice today and complains of no nausea currently. He denies history of kidney stones. He currently complains of 8 out of 10 pain in his right lower quadrant and right flank. Patient reports he has been drinking liquor, hamburgers and hot dogs and believes this may have caused his abdominal pain today. Patient had a previous laparotomy with gastric ulcer bleed that was repaired back in March of this year. Patient denies fevers, urinary symptoms, diarrhea, history of kidney stones, chest pain, shortness breath, rashes, hematemesis, epigastric pain.   (Consider location/radiation/quality/duration/timing/severity/associated sxs/prior Treatment) HPI  Past Medical History  Diagnosis Date  . Abdominal pain, other specified site   . GERD (gastroesophageal reflux disease)   . Abnormal CT scan, esophagus 03/2012  . Substance abuse 03/2012    tox screen positive for cocaine.    Past Surgical History  Procedure Laterality Date  . Undescended testicle      on right. noted on 03/2012 CT scan.  no surgery referral.   . Esophagogastroduodenoscopy  08/10/2012    Procedure: ESOPHAGOGASTRODUODENOSCOPY (EGD);  Surgeon: [name], MD;  Location: [facility] ENDOSCOPY;  Service: Endoscopy;  Laterality: N/A;  will be done in room 1   . Laparotomy  08/10/2012    Procedure: EXPLORATORY LAPAROTOMY;  Surgeon: [name], MD;  Location: [facility] OR;  Service: General;  Laterality: N/A;  .  Esophagogastroduodenoscopy Left 05/01/2013    Procedure: ESOPHAGOGASTRODUODENOSCOPY (EGD);  Surgeon: [name], MD;  Location: [facility] ENDOSCOPY;  Service: Endoscopy;  Laterality: Left;  . Esophagogastroduodenoscopy N/A 05/11/2013    Procedure: ESOPHAGOGASTRODUODENOSCOPY (EGD);  Surgeon: [name], MD;  Location: [facility] ENDOSCOPY;  Service: Endoscopy;  Laterality: N/A;  . Esophagogastroduodenoscopy N/A 10/06/2014    Procedure: ESOPHAGOGASTRODUODENOSCOPY (EGD);  Surgeon: [name], MD;  Location: [facility] ENDOSCOPY;  Service: Endoscopy;  Laterality: N/A;  . Esophagogastroduodenoscopy N/A 10/09/2014    Procedure: ESOPHAGOGASTRODUODENOSCOPY (EGD);  Surgeon: [name], MD;  Location: [facility] ENDOSCOPY;  Service: Gastroenterology;  Laterality: N/A;  Bedside  . Laparotomy N/A 10/10/2014    Procedure: EXPLORATORY LAPAROTOMY WITH GASTROPATHY AND OVER SEWEN DUODENAL ULCER REGION ;  Surgeon: [name], MD;  Location: [facility] ORS;  Service: General;  Laterality: N/A;   History reviewed. No pertinent family history. Social History  Substance Use Topics  . Smoking status: Light Tobacco Smoker -- 1.00 packs/day for 34 years  . Smokeless tobacco: Never Used  . Alcohol Use: Yes     Comment: 40oz beer/day    Review of Systems  Constitutional: Negative for fever and chills.  HENT: Negative for congestion and sore throat.   Eyes: Negative for visual disturbance.  Respiratory: Negative for cough and shortness of breath.   Cardiovascular: Negative for chest pain.  Gastrointestinal: Positive for nausea, vomiting and abdominal pain. Negative for diarrhea and blood in stool.  Genitourinary: Negative for dysuria, frequency, hematuria, decreased urine volume and difficulty urinating.  Musculoskeletal: Negative for back pain and  neck pain.  Skin: Negative for rash.  Neurological: Negative for light-headedness and headaches.      Allergies  Nsaids; Chocolate; Peanut-containing drug products; and Spinach  Home  Medications   Prior to Admission medications   Medication Sig Start Date End Date Taking? Authorizing Provider  acetaminophen (TYLENOL) 325 MG tablet Take 2 tablets (650 mg total) by mouth every 4 (four) hours as needed for mild pain, moderate pain, fever or headache. 10/20/14   Jeralyn Bennett, MD  ferrous sulfate 325 (65 FE) MG tablet Take 1 tablet (325 mg total) by mouth daily after supper. 10/20/14   Jeralyn Bennett, MD  HYDROcodone-acetaminophen (NORCO/VICODIN) 5-325 MG per tablet Take 1 tablet by mouth every 4 (four) hours as needed for moderate pain. 10/20/14   Jeralyn Bennett, MD  ondansetron (ZOFRAN ODT) 4 MG disintegrating tablet Take 1 tablet (4 mg total) by mouth every 6 (six) hours as needed for nausea or vomiting. 10/25/14   Elwin Mocha, MD  pantoprazole (PROTONIX) 40 MG tablet Take 1 tablet (40 mg total) by mouth 2 (two) times daily. 10/20/14   Jeralyn Bennett, MD  pantoprazole (PROTONIX) 40 MG tablet Take 1 tablet (40 mg total) by mouth 2 (two) times daily. 10/25/14   Elwin Mocha, MD  saccharomyces boulardii (FLORASTOR) 250 MG capsule Take 1 capsule (250 mg total) by mouth 2 (two) times daily. 10/20/14   Jeralyn Bennett, MD   BP 112/69 mmHg  Pulse 76  Temp(Src) 97.8 F (36.6 C) (Oral)  Resp 18  SpO2 98% Physical Exam  Constitutional: He is oriented to person, place, and time. He appears well-developed and well-nourished. No distress.  Nontoxic appearing.  HENT:  Head: Normocephalic and atraumatic.  Mouth/Throat: Oropharynx is clear and moist.  Eyes: Conjunctivae are normal. Pupils are equal, round, and reactive to light. Right eye exhibits no discharge. Left eye exhibits no discharge.  Neck: Neck supple.  Cardiovascular: Normal rate, regular rhythm, normal heart sounds and intact distal pulses.  Exam reveals no gallop and no friction rub.   No murmur heard. Pulmonary/Chest: Effort normal and breath sounds normal. No respiratory distress. He has no wheezes. He has no rales.   Abdominal: Soft. Bowel sounds are normal. He exhibits no distension and no mass. There is tenderness. There is no rebound and no guarding.  Abdomen is soft. Bowel sounds are present. Patient has mild right lower quadrant and left lower quadrant tenderness to palpation. No CVA or flank tenderness. No psoas or obturator sign. No rebound tenderness. No Rovsing sign. No epigastric tenderness.  Musculoskeletal: He exhibits no edema or tenderness.  Lymphadenopathy:    He has no cervical adenopathy.  Neurological: He is alert and oriented to person, place, and time. Coordination normal.  Skin: Skin is warm and dry. No rash noted. He is not diaphoretic. No erythema. No pallor.  Psychiatric: He has a normal mood and affect. His behavior is normal.  Nursing note and vitals reviewed.   ED Course  Procedures (including critical care time) Labs Review Labs Reviewed  CBC WITH DIFFERENTIAL/PLATELET - Abnormal; Notable for the following:    Hemoglobin 11.6 (*)    HCT 36.6 (*)    MCV 76.6 (*)    MCH 24.3 (*)    All other components within normal limits  COMPREHENSIVE METABOLIC PANEL - Abnormal; Notable for the following:    Potassium 3.4 (*)    Glucose, Bld 105 (*)    Calcium 8.7 (*)    All other components within normal limits  I-STAT  CHEM 8, ED - Abnormal; Notable for the following:    Potassium 3.4 (*)    Glucose, Bld 102 (*)    Hemoglobin 12.2 (*)    HCT 36.0 (*)    All other components within normal limits  ETHANOL  LIPASE, BLOOD  URINALYSIS, ROUTINE W REFLEX MICROSCOPIC (NOT [facility])    Imaging Review Ct Renal Stone Study  06/19/2015  CLINICAL DATA:  Pt complains of abd pain for two days, has eaten hamburgers and hotdogs and drank malt liquor, may have pancreatitis, pt knows he's not suppose to eat this stuff or drink. EXAM: CT ABDOMEN AND PELVIS WITHOUT CONTRAST TECHNIQUE: Multidetector CT imaging of the abdomen and pelvis was performed following the standard protocol without IV  contrast. COMPARISON:  10/25/2014 FINDINGS: Visualized lung bases clear. Unremarkable liver, spleen, adrenal glands, kidneys. No nephrolithiasis or hydronephrosis. Gallbladder nondistended. Unenhanced CT was performed per clinician order. Lack of IV contrast limits sensitivity and specificity, especially for evaluation of abdominal/pelvic solid viscera. SMA embolization coils. There are inflammatory/edematous changes around the pancreatic head and proximal duodenum obscuring fat planes. There is a low attenuation fluid collection containing a few small gas bubbles just inferior and lateral to the embolization coils but in the absence of any oral contrast its difficult to differentiate whether this represents fluid in the lumen of the duodenum or extraluminal collection. Progressive adenopathy inferior to this process in the gastrocolic ligament. Pancreatic body and tail unremarkable. Stomach is distended by ingested material. Small bowel is nondilated. Appendix not identified. Gas and fecal material distend the proximal colon, decompressed distally. Urinary bladder physiologically distended.  Left pelvic phleboliths. No definite ascites.  No free air.  No adenopathy localized. Lumbar spine and bony pelvis intact. IMPRESSION: 1. No nephrolithiasis or hydronephrosis. 2. Obscured fat planes around pancreatic head and proximal duodenum as before, possibly due to inflammatory changes. Progressive regional adenopathy. Nonspecific fluid collection in the midst of this process as discussed above possibly small abscess. If there is clinical concern of pancreatitis or related complications, Scanning with oral and IV contrast recommended for further evaluation. Critical Value/emergent results were called by telephone at the time of interpretation on 06/18/2015 at 11:59 pm to [facility] Ubaldo Daywalt PA , who verbally acknowledged these results. Electronically Signed   By: Corlis Leak  Hassell M.D.   On: 06/19/2015 00:01   I have personally  reviewed and evaluated these images and lab results as part of my medical decision-making.   EKG Interpretation None      Filed Vitals:   06/18/15 2048 06/18/15 2059  BP: 154/95 112/69  Pulse: 71 76  Temp: 97.8 F (36.6 C)   TempSrc: Oral   Resp: 18 18  SpO2: 100% 98%     MDM   Meds given in ED:  Medications  iohexol (OMNIPAQUE) 300 MG/ML solution 100 mL (not administered)  sodium chloride 0.9 % bolus 500 mL (500 mLs Intravenous New Bag/Given 06/18/15 2301)  sodium chloride 0.9 % bolus 1,000 mL (1,000 mLs Intravenous New Bag/Given 06/19/15 0005)  ondansetron (ZOFRAN) injection 4 mg (4 mg Intravenous Given 06/19/15 0005)  morphine 4 MG/ML injection 4 mg (4 mg Intravenous Given 06/19/15 0005)    New Prescriptions   No medications on file    Final diagnoses:  Right lower quadrant abdominal pain  Non-intractable vomiting with nausea, vomiting of unspecified type   This is a 54 y.o. male with a history of the gastric ulcer bleed with repair, and alcohol abuse who presents to the emergency department  complaining of abdominal pain for the past 2 days. He complains of right lower quadrant abdominal pain that radiates to his right flank and can fluctuate in intensity and move. He reports he's vomited twice today and complains of no nausea currently. He denies history of kidney stones. He currently complains of 8 out of 10 pain in his right lower quadrant and right flank. Patient reports he has been drinking liquor, hamburgers and hot dogs and believes this may have caused his abdominal pain today. Patient had a previous laparotomy with gastric ulcer bleed that was repaired back in March of this year.  On examination his afebrile nontoxic appearing. His abdomen is soft and has mild right lower and left lower quadrant tenderness to palpation. No CVA or flank tenderness. No epigastric tenderness to palpation. Patient's lipase is within normal limits at 28. CMP indicates a potassium of 3.4  and a creatinine of 1.04. CBC shows conglomerate 11.6 and is otherwise unremarkable.  CT abdomen without contrast was obtained did to his description of symptoms concerning for renal stone. This showed no nephrolithiasis or hydronephrosis. Did show some inflammatory changes on his pancreatic head and radiology called recommending the patient be scanned with oral and IV contrast for further evaluation. I explained the findings to the patient and he understands and agrees to CT scan with contrast. He reports his abdominal pain is improving after pain medication.  At shift change the patient is awaiting CT scan of his abdomen and pelvis. Patient care handed off to [facility], PA-C who will disposition the patient.     Everlene Farrier, PA-C 06/20/15 0100  Tomasita Crumble, MD 06/20/15 [phone]

## 2015-06-19 ENCOUNTER — Encounter (HOSPITAL_COMMUNITY): Payer: Self-pay

## 2015-06-19 ENCOUNTER — Emergency Department (HOSPITAL_COMMUNITY): Payer: Medicaid Other

## 2015-06-19 LAB — URINALYSIS, ROUTINE W REFLEX MICROSCOPIC
Bilirubin Urine: NEGATIVE
GLUCOSE, UA: NEGATIVE mg/dL
Hgb urine dipstick: NEGATIVE
Ketones, ur: NEGATIVE mg/dL
LEUKOCYTES UA: NEGATIVE
Nitrite: NEGATIVE
Protein, ur: NEGATIVE mg/dL
Specific Gravity, Urine: 1.015 (ref 1.005–1.030)
pH: 7.5 (ref 5.0–8.0)

## 2015-06-19 LAB — COMPREHENSIVE METABOLIC PANEL
ALT: 17 U/L (ref 17–63)
AST: 23 U/L (ref 15–41)
Albumin: 3.5 g/dL (ref 3.5–5.0)
Alkaline Phosphatase: 75 U/L (ref 38–126)
Anion gap: 7 (ref 5–15)
BILIRUBIN TOTAL: 0.4 mg/dL (ref 0.3–1.2)
BUN: 8 mg/dL (ref 6–20)
CALCIUM: 8.7 mg/dL — AB (ref 8.9–10.3)
CO2: 27 mmol/L (ref 22–32)
CREATININE: 1.04 mg/dL (ref 0.61–1.24)
Chloride: 104 mmol/L (ref 101–111)
GFR calc Af Amer: >60 mL/min (ref >60)
GFR calc non Af Amer: >60 mL/min (ref >60)
Glucose, Bld: 105 mg/dL — ABNORMAL HIGH (ref 65–99)
Potassium: 3.4 mmol/L — ABNORMAL LOW (ref 3.5–5.1)
Sodium: 138 mmol/L (ref 135–145)
TOTAL PROTEIN: 6.8 g/dL (ref 6.5–8.1)

## 2015-06-19 LAB — I-STAT CHEM 8, ED
BUN: 6 mg/dL (ref 6–20)
CALCIUM ION: 1.14 mmol/L (ref 1.12–1.23)
Chloride: 101 mmol/L (ref 101–111)
Creatinine, Ser: 1.1 mg/dL (ref 0.61–1.24)
GLUCOSE: 102 mg/dL — AB (ref 65–99)
HCT: 36 % — ABNORMAL LOW (ref 39.0–52.0)
HEMOGLOBIN: 12.2 g/dL — AB (ref 13.0–17.0)
Potassium: 3.4 mmol/L — ABNORMAL LOW (ref 3.5–5.1)
SODIUM: 142 mmol/L (ref 135–145)
TCO2: 26 mmol/L (ref 0–100)

## 2015-06-19 LAB — LIPASE, BLOOD: LIPASE: 28 U/L (ref 11–51)

## 2015-06-19 MED ORDER — ONDANSETRON 8 MG PO TBDP: 8.0000 mg | ORAL_TABLET | Freq: Three times a day (TID) | ORAL | Status: DC | PRN

## 2015-06-19 MED ORDER — TRAMADOL HCL 50 MG PO TABS: 50.0000 mg | ORAL_TABLET | Freq: Four times a day (QID) | ORAL | Status: DC | PRN

## 2015-06-19 MED ORDER — MORPHINE SULFATE (PF) 4 MG/ML IV SOLN
4.0000 mg | Freq: Once | INTRAVENOUS | Status: AC
  Administered 2015-06-19: 4 mg via INTRAVENOUS
  Filled 2015-06-19: qty 1

## 2015-06-19 MED ORDER — IOHEXOL 300 MG/ML  SOLN
100.0000 mL | Freq: Once | INTRAMUSCULAR | Status: AC | PRN
  Administered 2015-06-19: 100 mL via INTRAVENOUS

## 2015-06-19 NOTE — ED Notes (Signed)
Patient transported to CT 

## 2015-06-19 NOTE — ED Provider Notes (Signed)
Patient signed out to me at shift change pending CT abdomen and pelvis.  2:59 AM CT abdomen and pelvis resulted showing duodenitis versus pancreatitis. No other findings. Patients reassessed. He states he feels much better. His vital signs are normal. Abdomen is soft, nontender at this time. Plan to discharge home with nausea medications and pain medications. Advise clear liquids in case of pancreatitis for the next 24 hours. Follow-up with primary care doctor. Patient agrees.  Filed Vitals:   06/18/15 2048 06/18/15 2059  BP: 154/95 112/69  Pulse: 71 76  Temp: 97.8 F (36.6 C)   TempSrc: Oral   Resp: 18 18  SpO2: 100% 98%     Jaynie Crumbleatyana Davey Limas, PA-C 06/19/15 0300  Tomasita CrumbleAdeleke Oni, MD 06/19/15 [phone]

## 2015-06-19 NOTE — ED Notes (Signed)
Notified EDP,Oni,MD., pt. i-stat Chem 8 results potassium 3.4

## 2015-06-19 NOTE — Discharge Instructions (Signed)
Tramadol for pain as needed. zofran for nausea. Follow up with primary care doctor. Clear liquids for the next 2 days. Return if worsening.   Abdominal Pain, Adult Many things can cause abdominal pain. Usually, abdominal pain is not caused by a disease and will improve without treatment. It can often be observed and treated at home. Your health care provider will do a physical exam and possibly order blood tests and X-rays to help determine the seriousness of your pain. However, in many cases, more time must pass before a clear cause of the pain can be found. Before that point, your health care provider may not know if you need more testing or further treatment. HOME CARE INSTRUCTIONS Monitor your abdominal pain for any changes. The following actions may help to alleviate any discomfort you are experiencing:  Only take over-the-counter or prescription medicines as directed by your health care provider.  Do not take laxatives unless directed to do so by your health care provider.  Try a clear liquid diet (broth, tea, or water) as directed by your health care provider. Slowly move to a bland diet as tolerated. SEEK MEDICAL CARE IF:  You have unexplained abdominal pain.  You have abdominal pain associated with nausea or diarrhea.  You have pain when you urinate or have a bowel movement.  You experience abdominal pain that wakes you in the night.  You have abdominal pain that is worsened or improved by eating food.  You have abdominal pain that is worsened with eating fatty foods.  You have a fever. SEEK IMMEDIATE MEDICAL CARE IF:  Your pain does not go away within 2 hours.  You keep throwing up (vomiting).  Your pain is felt only in portions of the abdomen, such as the right side or the left lower portion of the abdomen.  You pass bloody or black tarry stools. MAKE SURE YOU:  Understand these instructions.  Will watch your condition.  Will get help right away if you are not  doing well or get worse.   This information is not intended to replace advice given to you by your health care provider. Make sure you discuss any questions you have with your health care provider.   Document Released: 04/27/2005 Document Revised: 04/08/2015 Document Reviewed: 03/27/2013 Elsevier Interactive Patient Education [employer]2016 [employer]

## 2015-10-25 ENCOUNTER — Emergency Department (HOSPITAL_COMMUNITY)
Admission: EM | Admit: 2015-10-25 | Discharge: 2015-10-25 | Disposition: A | Discharge status: Home / Self Care | Payer: Medicaid Other | Attending: Emergency Medicine | Admitting: Emergency Medicine

## 2015-10-25 ENCOUNTER — Encounter (HOSPITAL_COMMUNITY): Payer: Self-pay

## 2015-10-25 ENCOUNTER — Emergency Department (HOSPITAL_COMMUNITY): Payer: Medicaid Other

## 2015-10-25 DIAGNOSIS — Z79899 Other long term (current) drug therapy: Secondary | ICD-10-CM | POA: Insufficient documentation

## 2015-10-25 DIAGNOSIS — K219 Gastro-esophageal reflux disease without esophagitis: Secondary | ICD-10-CM | POA: Diagnosis not present

## 2015-10-25 DIAGNOSIS — I10 Essential (primary) hypertension: Secondary | ICD-10-CM | POA: Diagnosis not present

## 2015-10-25 DIAGNOSIS — F1410 Cocaine abuse, uncomplicated: Secondary | ICD-10-CM | POA: Insufficient documentation

## 2015-10-25 DIAGNOSIS — K2970 Gastritis, unspecified, without bleeding: Secondary | ICD-10-CM | POA: Insufficient documentation

## 2015-10-25 DIAGNOSIS — F17200 Nicotine dependence, unspecified, uncomplicated: Secondary | ICD-10-CM | POA: Insufficient documentation

## 2015-10-25 DIAGNOSIS — R110 Nausea: Secondary | ICD-10-CM

## 2015-10-25 DIAGNOSIS — R1013 Epigastric pain: Secondary | ICD-10-CM | POA: Diagnosis present

## 2015-10-25 LAB — CBC
HEMATOCRIT: 44.4 % (ref 39.0–52.0)
HEMOGLOBIN: 14.7 g/dL (ref 13.0–17.0)
MCH: 24.3 pg — ABNORMAL LOW (ref 26.0–34.0)
MCHC: 33.1 g/dL (ref 30.0–36.0)
MCV: 73.3 fL — ABNORMAL LOW (ref 78.0–100.0)
Platelets: 202 10*3/uL (ref 150–400)
RBC: 6.06 MIL/uL — AB (ref 4.22–5.81)
RDW: 16.6 % — AB (ref 11.5–15.5)
WBC: 12.8 10*3/uL — ABNORMAL HIGH (ref 4.0–10.5)

## 2015-10-25 LAB — URINALYSIS, ROUTINE W REFLEX MICROSCOPIC
Glucose, UA: NEGATIVE mg/dL
Hgb urine dipstick: NEGATIVE
KETONES UR: 15 mg/dL — AB
Leukocytes, UA: NEGATIVE
NITRITE: NEGATIVE
PH: 6 (ref 5.0–8.0)
Protein, ur: 30 mg/dL — AB
Specific Gravity, Urine: 1.031 — ABNORMAL HIGH (ref 1.005–1.030)

## 2015-10-25 LAB — COMPREHENSIVE METABOLIC PANEL
ALBUMIN: 4.1 g/dL (ref 3.5–5.0)
ALT: 24 U/L (ref 17–63)
ANION GAP: 12 (ref 5–15)
AST: 23 U/L (ref 15–41)
Alkaline Phosphatase: 84 U/L (ref 38–126)
BUN: 17 mg/dL (ref 6–20)
CALCIUM: 9 mg/dL (ref 8.9–10.3)
CO2: 25 mmol/L (ref 22–32)
Chloride: 102 mmol/L (ref 101–111)
Creatinine, Ser: 1.1 mg/dL (ref 0.61–1.24)
GFR calc non Af Amer: >60 mL/min (ref >60)
GLUCOSE: 129 mg/dL — AB (ref 65–99)
Potassium: 3.9 mmol/L (ref 3.5–5.1)
Sodium: 139 mmol/L (ref 135–145)
TOTAL PROTEIN: 8.1 g/dL (ref 6.5–8.1)
Total Bilirubin: 1.2 mg/dL (ref 0.3–1.2)

## 2015-10-25 LAB — URINE MICROSCOPIC-ADD ON
Bacteria, UA: NONE SEEN
RBC / HPF: NONE SEEN RBC/hpf (ref 0–5)
SQUAMOUS EPITHELIAL / LPF: NONE SEEN

## 2015-10-25 LAB — RAPID URINE DRUG SCREEN, HOSP PERFORMED
Amphetamines: NOT DETECTED
BARBITURATES: NOT DETECTED
Benzodiazepines: NOT DETECTED
Cocaine: POSITIVE — AB
Opiates: NOT DETECTED
Tetrahydrocannabinol: NOT DETECTED

## 2015-10-25 LAB — ETHANOL

## 2015-10-25 LAB — LIPASE, BLOOD: LIPASE: 22 U/L (ref 11–51)

## 2015-10-25 MED ORDER — PANTOPRAZOLE SODIUM 40 MG PO TBEC: 40.0000 mg | DELAYED_RELEASE_TABLET | Freq: Two times a day (BID) | ORAL | Status: DC

## 2015-10-25 MED ORDER — HYDROMORPHONE HCL 1 MG/ML IJ SOLN
1.0000 mg | Freq: Once | INTRAMUSCULAR | Status: AC
  Administered 2015-10-25: 1 mg via INTRAVENOUS
  Filled 2015-10-25: qty 1

## 2015-10-25 MED ORDER — ONDANSETRON HCL 4 MG/2ML IJ SOLN
4.0000 mg | Freq: Once | INTRAMUSCULAR | Status: AC
  Administered 2015-10-25: 4 mg via INTRAVENOUS
  Filled 2015-10-25: qty 2

## 2015-10-25 MED ORDER — PANTOPRAZOLE SODIUM 40 MG IV SOLR
40.0000 mg | Freq: Once | INTRAVENOUS | Status: AC
  Administered 2015-10-25: 40 mg via INTRAVENOUS
  Filled 2015-10-25: qty 40

## 2015-10-25 MED ORDER — SODIUM CHLORIDE 0.9 % IV BOLUS (SEPSIS)
500.0000 mL | Freq: Once | INTRAVENOUS | Status: AC
  Administered 2015-10-25: 500 mL via INTRAVENOUS

## 2015-10-25 NOTE — ED Notes (Signed)
Nurse starting IV will collect labs 

## 2015-10-25 NOTE — ED Notes (Signed)
Patient transported to X-ray 

## 2015-10-25 NOTE — ED Notes (Signed)
Patient presents for sharp abdominal pain radiating from LUQ to RLQ x1 day and N/V.   Last VS: 136/90, 72hr, 18resp, [id number]

## 2015-10-25 NOTE — ED Provider Notes (Addendum)
CSN: [id number]     Arrival date & time 10/25/15  1057 History   First MD Initiated Contact with Patient 10/25/15 1107     Chief Complaint  Patient presents with  . Abdominal Pain     (Consider location/radiation/quality/duration/timing/severity/associated sxs/prior Treatment) Patient is a 55 y.o. male presenting with abdominal pain. The history is provided by the patient.  Abdominal Pain Associated symptoms: nausea and vomiting   Associated symptoms: no chest pain, no chills, no cough, no dysuria, no fever, no shortness of breath and no sore throat   Patient w hx pud/duodenal ulcer, c/o acute onset abd pain since yesterday w nausea and vomiting.  Pain is constant, dull, mod-severe, epigastric area, non radiating. No specific exacerbating or  alleviating factors. States feels similar to when had problems w ulcers. Not currently on any acid blocker therapy. Denies asa or nsaid use. Denies any recent etoh use. Hx substance abuse noted. Patient denies back or flank pain. No chest pain or sob. No fever or chills. +nv, emesis clear to sl brownish.  Has been having normal bms.       Past Medical History  Diagnosis Date  . Abdominal pain, other specified site   . GERD (gastroesophageal reflux disease)   . Abnormal CT scan, esophagus 03/2012  . Substance abuse 03/2012    tox screen positive for cocaine.    Past Surgical History  Procedure Laterality Date  . Undescended testicle      on right. noted on 03/2012 CT scan.  no surgery referral.   . Esophagogastroduodenoscopy  08/10/2012    Procedure: ESOPHAGOGASTRODUODENOSCOPY (EGD);  Surgeon: [name], MD;  Location: [facility] ENDOSCOPY;  Service: Endoscopy;  Laterality: N/A;  will be done in room 1   . Laparotomy  08/10/2012    Procedure: EXPLORATORY LAPAROTOMY;  Surgeon: [name], MD;  Location: [facility] OR;  Service: General;  Laterality: N/A;  . Esophagogastroduodenoscopy Left 05/01/2013    Procedure: ESOPHAGOGASTRODUODENOSCOPY (EGD);  Surgeon:  [name], MD;  Location: [facility] ENDOSCOPY;  Service: Endoscopy;  Laterality: Left;  . Esophagogastroduodenoscopy N/A 05/11/2013    Procedure: ESOPHAGOGASTRODUODENOSCOPY (EGD);  Surgeon: [name], MD;  Location: [facility] ENDOSCOPY;  Service: Endoscopy;  Laterality: N/A;  . Esophagogastroduodenoscopy N/A 10/06/2014    Procedure: ESOPHAGOGASTRODUODENOSCOPY (EGD);  Surgeon: [name], MD;  Location: [facility] ENDOSCOPY;  Service: Endoscopy;  Laterality: N/A;  . Esophagogastroduodenoscopy N/A 10/09/2014    Procedure: ESOPHAGOGASTRODUODENOSCOPY (EGD);  Surgeon: [name], MD;  Location: [facility] ENDOSCOPY;  Service: Gastroenterology;  Laterality: N/A;  Bedside  . Laparotomy N/A 10/10/2014    Procedure: EXPLORATORY LAPAROTOMY WITH GASTROPATHY AND OVER SEWEN DUODENAL ULCER REGION ;  Surgeon: [name], MD;  Location: [facility] ORS;  Service: General;  Laterality: N/A;   No family history on file. Social History  Substance Use Topics  . Smoking status: Light Tobacco Smoker -- 1.00 packs/day for 34 years  . Smokeless tobacco: Never Used  . Alcohol Use: Yes     Comment: 40oz beer/day    Review of Systems  Constitutional: Negative for fever and chills.  HENT: Negative for sore throat.   Eyes: Negative for redness.  Respiratory: Negative for cough and shortness of breath.   Cardiovascular: Negative for chest pain.  Gastrointestinal: Positive for nausea, vomiting and abdominal pain.  Genitourinary: Negative for dysuria and flank pain.  Musculoskeletal: Negative for back pain and neck pain.  Skin: Negative for rash.  Neurological: Negative for headaches.  Hematological: Does not bruise/bleed easily.  Psychiatric/Behavioral:  Negative for confusion.      Allergies  Nsaids; Chocolate; Peanut-containing drug products; and Spinach  Home Medications   Prior to Admission medications   Medication Sig Start Date End Date Taking? Authorizing Provider  acetaminophen (TYLENOL) 325 MG tablet Take 2 tablets (650 mg  total) by mouth every 4 (four) hours as needed for mild pain, moderate pain, fever or headache. 10/20/14   Jeralyn BennettEzequiel Zamora, MD  ferrous sulfate 325 (65 FE) MG tablet Take 1 tablet (325 mg total) by mouth daily after supper. 10/20/14   Jeralyn BennettEzequiel Zamora, MD  HYDROcodone-acetaminophen (NORCO/VICODIN) 5-325 MG per tablet Take 1 tablet by mouth every 4 (four) hours as needed for moderate pain. 10/20/14   Jeralyn BennettEzequiel Zamora, MD  ondansetron (ZOFRAN ODT) 8 MG disintegrating tablet Take 1 tablet (8 mg total) by mouth every 8 (eight) hours as needed for nausea or vomiting. 06/19/15   Tatyana Kirichenko, PA-C  pantoprazole (PROTONIX) 40 MG tablet Take 1 tablet (40 mg total) by mouth 2 (two) times daily. 10/20/14   Jeralyn BennettEzequiel Zamora, MD  pantoprazole (PROTONIX) 40 MG tablet Take 1 tablet (40 mg total) by mouth 2 (two) times daily. 10/25/14   Elwin MochaBlair Walden, MD  saccharomyces boulardii (FLORASTOR) 250 MG capsule Take 1 capsule (250 mg total) by mouth 2 (two) times daily. 10/20/14   Jeralyn BennettEzequiel Zamora, MD  traMADol (ULTRAM) 50 MG tablet Take 1 tablet (50 mg total) by mouth every 6 (six) hours as needed. 06/19/15   Tatyana Kirichenko, PA-C   BP 155/107 mmHg  Pulse 93  Temp(Src) 98.3 F (36.8 C) (Oral)  Resp 18  SpO2 96% Physical Exam  Constitutional: He is oriented to person, place, and time. He appears well-developed and well-nourished. No distress.  HENT:  Mouth/Throat: Oropharynx is clear and moist.  Eyes: Conjunctivae are normal. Pupils are equal, round, and reactive to light. No scleral icterus.  Neck: Neck supple. No tracheal deviation present.  Cardiovascular: Normal rate, regular rhythm, normal heart sounds and intact distal pulses.  Exam reveals no gallop and no friction rub.   No murmur heard. Pulmonary/Chest: Effort normal and breath sounds normal. No accessory muscle usage. No respiratory distress.  Abdominal: Soft. He exhibits no distension and no mass. There is tenderness. There is no rebound and no guarding.   Epigastric tenderness.   Genitourinary:  No cva tenderness  Musculoskeletal: Normal range of motion.  Neurological: He is alert and oriented to person, place, and time.  Skin: Skin is warm and dry. He is not diaphoretic.  Psychiatric: He has a normal mood and affect.  Nursing note and vitals reviewed.   ED Course  Procedures (including critical care time) Labs Review  Results for orders placed or performed during the hospital encounter of 10/25/15  CBC  Result Value Ref Range   WBC 12.8 (H) 4.0 - 10.5 K/uL   RBC 6.06 (H) 4.22 - 5.81 MIL/uL   Hemoglobin 14.7 13.0 - 17.0 g/dL   HCT 40.944.4 81.139.0 - 91.452.0 %   MCV 73.3 (L) 78.0 - 100.0 fL   MCH 24.3 (L) 26.0 - 34.0 pg   MCHC 33.1 30.0 - 36.0 g/dL   RDW 78.216.6 (H) 95.611.5 - 21.315.5 %   Platelets 202 150 - 400 K/uL  Comprehensive metabolic panel  Result Value Ref Range   Sodium 139 135 - 145 mmol/L   Potassium 3.9 3.5 - 5.1 mmol/L   Chloride 102 101 - 111 mmol/L   CO2 25 22 - 32 mmol/L   Glucose, Bld 129 (H)  65 - 99 mg/dL   BUN 17 6 - 20 mg/dL   Creatinine, Ser 1.61 0.61 - 1.24 mg/dL   Calcium 9.0 8.9 - 09.6 mg/dL   Total Protein 8.1 6.5 - 8.1 g/dL   Albumin 4.1 3.5 - 5.0 g/dL   AST 23 15 - 41 U/L   ALT 24 17 - 63 U/L   Alkaline Phosphatase 84 38 - 126 U/L   Total Bilirubin 1.2 0.3 - 1.2 mg/dL   GFR calc non Af Amer >60 >60 mL/min   GFR calc Af Amer >60 >60 mL/min   Anion gap 12 5 - 15  Lipase, blood  Result Value Ref Range   Lipase 22 11 - 51 U/L  Urinalysis, Routine w reflex microscopic (not at [facility])  Result Value Ref Range   Color, Urine AMBER (A) YELLOW   APPearance CLEAR CLEAR   Specific Gravity, Urine 1.031 (H) 1.005 - 1.030   pH 6.0 5.0 - 8.0   Glucose, UA NEGATIVE NEGATIVE mg/dL   Hgb urine dipstick NEGATIVE NEGATIVE   Bilirubin Urine SMALL (A) NEGATIVE   Ketones, ur 15 (A) NEGATIVE mg/dL   Protein, ur 30 (A) NEGATIVE mg/dL   Nitrite NEGATIVE NEGATIVE   Leukocytes, UA NEGATIVE NEGATIVE  Urine rapid drug screen (hosp  performed)  Result Value Ref Range   Opiates NONE DETECTED NONE DETECTED   Cocaine POSITIVE (A) NONE DETECTED   Benzodiazepines NONE DETECTED NONE DETECTED   Amphetamines NONE DETECTED NONE DETECTED   Tetrahydrocannabinol NONE DETECTED NONE DETECTED   Barbiturates NONE DETECTED NONE DETECTED  Ethanol  Result Value Ref Range   Alcohol, Ethyl (B) <5 <5 mg/dL  Urine microscopic-add on  Result Value Ref Range   Squamous Epithelial / LPF NONE SEEN NONE SEEN   WBC, UA 0-5 0 - 5 WBC/hpf   RBC / HPF NONE SEEN 0 - 5 RBC/hpf   Bacteria, UA NONE SEEN NONE SEEN   Urine-Other MUCOUS PRESENT    Dg Abd Acute W/chest  10/25/2015  CLINICAL DATA:  Lower mid abdominal pain, vomiting EXAM: DG ABDOMEN ACUTE W/ 1V CHEST COMPARISON:  CT abdomen pelvis dated 06/19/2015. Chest radiograph dated 10/13/2014. FINDINGS: Scarring in the medial left upper lobe. No focal consolidation. No pleural effusion or pneumothorax. The heart is normal in size. Nonobstructive bowel gas pattern. No evidence of free air under the diaphragm on the upright view. Embolization coils in the right upper quadrant. IMPRESSION: No evidence of acute cardiopulmonary disease. No evidence of small bowel obstruction or free air. Electronically Signed   By: Charline Bills M.D.   On: 10/25/2015 12:36        I have personally reviewed and evaluated these images and lab results as part of my medical decision-making.    MDM   Iv ns bolus. protonix iv. Dilaudid 1 mg iv. zofran iv.  Reviewed nursing notes and prior charts for additional history.   Additional ns iv bolus.  Pt notes symptoms markedly improved. No current pain. No emesis in ED.   xrays neg for sbo.   On recheck, abd soft nt.  Pt afeb.  Pt tolerating po fluids, symptoms remain improved.  Pt indicates feels ready for d/c.  Rec close pcp f/u.  Return precautions provided.       Cathren Laine, MD 10/25/15 1444

## 2015-10-25 NOTE — Discharge Instructions (Signed)
It was our pleasure to provide your ER care today - we hope that you feel better.  Rest. Drink plenty of fluids.  Avoid tobacco and/or alcohol use.  No cocaine use. See resource guide provided.   Take protonix (acid blocker medication).  You may also try pepcid and/or maalox as need for symptom relief.  Follow up with primary care doctor in the next couple days for recheck.  Also have your blood pressure rechecked then as it is high today.  Return to ER if worse, new symptoms, fevers, persistent vomiting, worsening or severe abdominal pain, chest pain, trouble breathing, other concern.   You were given pain medication in the ER - no driving for the next 4 hours.    Abdominal Pain, Adult Many things can cause abdominal pain. Usually, abdominal pain is not caused by a disease and will improve without treatment. It can often be observed and treated at home. Your health care provider will do a physical exam and possibly order blood tests and X-rays to help determine the seriousness of your pain. However, in many cases, more time must pass before a clear cause of the pain can be found. Before that point, your health care provider may not know if you need more testing or further treatment. HOME CARE INSTRUCTIONS Monitor your abdominal pain for any changes. The following actions may help to alleviate any discomfort you are experiencing:  Only take over-the-counter or prescription medicines as directed by your health care provider.  Do not take laxatives unless directed to do so by your health care provider.  Try a clear liquid diet (broth, tea, or water) as directed by your health care provider. Slowly move to a bland diet as tolerated. SEEK MEDICAL CARE IF:  You have unexplained abdominal pain.  You have abdominal pain associated with nausea or diarrhea.  You have pain when you urinate or have a bowel movement.  You experience abdominal pain that wakes you in the night.  You have  abdominal pain that is worsened or improved by eating food.  You have abdominal pain that is worsened with eating fatty foods.  You have a fever. SEEK IMMEDIATE MEDICAL CARE IF:  Your pain does not go away within 2 hours.  You keep throwing up (vomiting).  Your pain is felt only in portions of the abdomen, such as the right side or the left lower portion of the abdomen.  You pass bloody or black tarry stools. MAKE SURE YOU:  Understand these instructions.  Will watch your condition.  Will get help right away if you are not doing well or get worse.   This information is not intended to replace advice given to you by your health care provider. Make sure you discuss any questions you have with your health care provider.   Document Released: 04/27/2005 Document Revised: 04/08/2015 Document Reviewed: 03/27/2013 Elsevier Interactive Patient Education 2016 Elsevier Inc.    Nausea and Vomiting Nausea is a sick feeling that often comes before throwing up (vomiting). Vomiting is a reflex where stomach contents come out of your mouth. Vomiting can cause severe loss of body fluids (dehydration). Children and elderly adults can become dehydrated quickly, especially if they also have diarrhea. Nausea and vomiting are symptoms of a condition or disease. It is important to find the cause of your symptoms. CAUSES   Direct irritation of the stomach lining. This irritation can result from increased acid production (gastroesophageal reflux disease), infection, food poisoning, taking certain medicines (such as nonsteroidal  anti-inflammatory drugs), alcohol use, or tobacco use.  Signals from the brain.These signals could be caused by a headache, heat exposure, an inner ear disturbance, increased pressure in the brain from injury, infection, a tumor, or a concussion, pain, emotional stimulus, or metabolic problems.  An obstruction in the gastrointestinal tract (bowel obstruction).  Illnesses such  as diabetes, hepatitis, gallbladder problems, appendicitis, kidney problems, cancer, sepsis, atypical symptoms of a heart attack, or eating disorders.  Medical treatments such as chemotherapy and radiation.  Receiving medicine that makes you sleep (general anesthetic) during surgery. DIAGNOSIS Your caregiver may ask for tests to be done if the problems do not improve after a few days. Tests may also be done if symptoms are severe or if the reason for the nausea and vomiting is not clear. Tests may include:  Urine tests.  Blood tests.  Stool tests.  Cultures (to look for evidence of infection).  X-rays or other imaging studies. Test results can help your caregiver make decisions about treatment or the need for additional tests. TREATMENT You need to stay well hydrated. Drink frequently but in small amounts.You may wish to drink water, sports drinks, clear broth, or eat frozen ice pops or gelatin dessert to help stay hydrated.When you eat, eating slowly may help prevent nausea.There are also some antinausea medicines that may help prevent nausea. HOME CARE INSTRUCTIONS   Take all medicine as directed by your caregiver.  If you do not have an appetite, do not force yourself to eat. However, you must continue to drink fluids.  If you have an appetite, eat a normal diet unless your caregiver tells you differently.  Eat a variety of complex carbohydrates (rice, wheat, potatoes, bread), lean meats, yogurt, fruits, and vegetables.  Avoid high-fat foods because they are more difficult to digest.  Drink enough water and fluids to keep your urine clear or pale yellow.  If you are dehydrated, ask your caregiver for specific rehydration instructions. Signs of dehydration may include:  Severe thirst.  Dry lips and mouth.  Dizziness.  Dark urine.  Decreasing urine frequency and amount.  Confusion.  Rapid breathing or pulse. SEEK IMMEDIATE MEDICAL CARE IF:   You have blood or  brown flecks (like coffee grounds) in your vomit.  You have black or bloody stools.  You have a severe headache or stiff neck.  You are confused.  You have severe abdominal pain.  You have chest pain or trouble breathing.  You do not urinate at least once every 8 hours.  You develop cold or clammy skin.  You continue to vomit for longer than 24 to 48 hours.  You have a fever. MAKE SURE YOU:   Understand these instructions.  Will watch your condition.  Will get help right away if you are not doing well or get worse.   This information is not intended to replace advice given to you by your health care provider. Make sure you discuss any questions you have with your health care provider.   Document Released: 07/18/2005 Document Revised: 10/10/2011 Document Reviewed: 12/15/2010 Elsevier Interactive Patient Education 2016 Elsevier Inc.    Gastritis, Adult Gastritis is soreness and swelling (inflammation) of the lining of the stomach. Gastritis can develop as a sudden onset (acute) or long-term (chronic) condition. If gastritis is not treated, it can lead to stomach bleeding and ulcers. CAUSES  Gastritis occurs when the stomach lining is weak or damaged. Digestive juices from the stomach then inflame the weakened stomach lining. The stomach lining may  be weak or damaged due to viral or bacterial infections. One common bacterial infection is the Helicobacter pylori infection. Gastritis can also result from excessive alcohol consumption, taking certain medicines, or having too much acid in the stomach.  SYMPTOMS  In some cases, there are no symptoms. When symptoms are present, they may include:  Pain or a burning sensation in the upper abdomen.  Nausea.  Vomiting.  An uncomfortable feeling of fullness after eating. DIAGNOSIS  Your caregiver may suspect you have gastritis based on your symptoms and a physical exam. To determine the cause of your gastritis, your caregiver may  perform the following:  Blood or stool tests to check for the H pylori bacterium.  Gastroscopy. A thin, flexible tube (endoscope) is passed down the esophagus and into the stomach. The endoscope has a light and camera on the end. Your caregiver uses the endoscope to view the inside of the stomach.  Taking a tissue sample (biopsy) from the stomach to examine under a microscope. TREATMENT  Depending on the cause of your gastritis, medicines may be prescribed. If you have a bacterial infection, such as an H pylori infection, antibiotics may be given. If your gastritis is caused by too much acid in the stomach, H2 blockers or antacids may be given. Your caregiver may recommend that you stop taking aspirin, ibuprofen, or other nonsteroidal anti-inflammatory drugs (NSAIDs). HOME CARE INSTRUCTIONS  Only take over-the-counter or prescription medicines as directed by your caregiver.  If you were given antibiotic medicines, take them as directed. Finish them even if you start to feel better.  Drink enough fluids to keep your urine clear or pale yellow.  Avoid foods and drinks that make your symptoms worse, such as:  Caffeine or alcoholic drinks.  Chocolate.  Peppermint or mint flavorings.  Garlic and onions.  Spicy foods.  Citrus fruits, such as oranges, lemons, or limes.  Tomato-based foods such as sauce, chili, salsa, and pizza.  Fried and fatty foods.  Eat small, frequent meals instead of large meals. SEEK IMMEDIATE MEDICAL CARE IF:   You have black or dark red stools.  You vomit blood or material that looks like coffee grounds.  You are unable to keep fluids down.  Your abdominal pain gets worse.  You have a fever.  You do not feel better after 1 week.  You have any other questions or concerns. MAKE SURE YOU:  Understand these instructions.  Will watch your condition.  Will get help right away if you are not doing well or get worse.   This information is not  intended to replace advice given to you by your health care provider. Make sure you discuss any questions you have with your health care provider.   Document Released: 07/12/2001 Document Revised: 01/17/2012 Document Reviewed: 08/31/2011 Elsevier Interactive Patient Education 2016 [employer].    Stimulant Use Disorder-Cocaine Cocaine is one of a group of powerful drugs called stimulants. Cocaine has medical uses for stopping nosebleeds and for pain control before minor nose or dental surgery. However, cocaine is misused because of the effects that it produces. These effects include:   A feeling of extreme pleasure.  Alertness.  High energy. Common street names for cocaine include coke, crack, blow, snow, and nose candy. Cocaine is snorted, dissolved in water and injected, or smoked.  Stimulants are addictive because they activate regions of the brain that produce both the pleasurable sensation of "reward" and psychological dependence. Together, these actions account for loss of control and the  rapid development of drug dependence. This means you become ill without the drug (withdrawal) and need to keep using it to function.  Stimulant use disorder is use of stimulants that disrupts your daily life. It disrupts relationships with family and friends and how you do your job. Cocaine increases your blood pressure and heart rate. It can cause a heart attack or stroke. Cocaine can also cause death from irregular heart rate or seizures. SYMPTOMS Symptoms of stimulant use disorder with cocaine include:  Use of cocaine in larger amounts or over a longer period of time than intended.  Unsuccessful attempts to cut down or control cocaine use.  A lot of time spent obtaining, using, or recovering from the effects of cocaine.  A strong desire or urge to use cocaine (craving).  Continued use of cocaine in spite of major problems at work, school, or home because of use.  Continued use of cocaine in  spite of relationship problems because of use.  Giving up or cutting down on important life activities because of cocaine use.  Use of cocaine over and over in situations when it is physically hazardous, such as driving a car.  Continued use of cocaine in spite of a physical problem that is likely related to use. Physical problems can include:  Malnutrition.  Nosebleeds.  Chest pain.  High blood pressure.  A hole that develops between the part of your nose that separates your nostrils (perforated nasal septum).  Lung and kidney damage.  Continued use of cocaine in spite of a mental problem that is likely related to use. Mental problems can include:  Schizophrenia-like symptoms.  Depression.  Bipolar mood swings.  Anxiety.  Sleep problems.  Need to use more and more cocaine to get the same effect, or lessened effect over time with use of the same amount of cocaine (tolerance).  Having withdrawal symptoms when cocaine use is stopped, or using cocaine to reduce or avoid withdrawal symptoms. Withdrawal symptoms include:  Depressed or irritable mood.  Low energy or restlessness.  Bad dreams.  Poor or excessive sleep.  Increased appetite. DIAGNOSIS Stimulant use disorder is diagnosed by your health care provider. You may be asked questions about your cocaine use and how it affects your life. A physical exam may be done. A drug screen may be ordered. You may be referred to a mental health professional. The diagnosis of stimulant use disorder requires at least two symptoms within 12 months. The type of stimulant use disorder depends on the number of signs and symptoms you have. The type may be:  Mild. Two or three signs and symptoms.  Moderate. Four or five signs and symptoms.  Severe. Six or more signs and symptoms. TREATMENT Treatment for stimulant use disorder is usually provided by mental health professionals with training in substance use disorders. The following  options are available:  Counseling or talk therapy. Talk therapy addresses the reasons you use cocaine and ways to keep you from using again. Goals of talk therapy include:  Identifying and avoiding triggers for use.  Handling cravings.  Replacing use with healthy activities.  Support groups. Support groups provide emotional support, advice, and guidance.  Medicine. Certain medicines may decrease cocaine cravings or withdrawal symptoms. HOME CARE INSTRUCTIONS  Take medicines only as directed by your health care provider.  Identify the people and activities that trigger your cocaine use and avoid them.  Keep all follow-up visits as directed by your health care provider. SEEK MEDICAL CARE IF:  Your symptoms  get worse or you relapse.  You are not able to take medicines as directed. SEEK IMMEDIATE MEDICAL CARE IF:  You have serious thoughts about hurting yourself or others.  You have a seizure, chest pain, sudden weakness, or loss of speech or vision. FOR MORE INFORMATION  National Institute on Drug Abuse: http://www.price-smith.com/  Substance Abuse and Mental Health Services Administration: SkateOasis.com.pt   This information is not intended to replace advice given to you by your health care provider. Make sure you discuss any questions you have with your health care provider.   Document Released: 07/15/2000 Document Revised: 08/08/2014 Document Reviewed: 07/31/2013 Elsevier Interactive Patient Education 2016 [employer].     [employer] Guide Outpatient Counseling/Substance Abuse Adult The United Ways 211 is a great source of information about community services available.  Access by dialing 2-1-1 from anywhere in [place], or by website -  PooledIncome.pl.   Other Local Resources (Updated 08/2015)  Crisis Hotlines   Services     Area Served  [employer]  Crisis Hotline, available 24 hours a day, 7 days a week: (418)168-4751  [facility], [place]   [facility]  Crisis Hotline, available 24 hours a day, 7 days a week: 518-319-6008 [facility], [place]  [facility]  Suicide Prevention Hotline, available 24 hours a day, 7 days a week: 989-208-4968 [facility], [place]  [employer], available 24 hours a day, 7 days a week: 843-201-5580 [facility], [place]   [facility] Access to [employer], available 24 hours a day, 7 days a week: 212-644-2035 All   [facility]  Crisis Hotline, available 24 hours a day, 7 days a week: (314)371-3532 All   Other Local Resources (Updated 08/2015)  Outpatient Counseling/ Substance Abuse Programs  Services     Address and Phone Number  ADS (Alcohol and Drug Services)   Options include Individual counseling, group counseling, intensive outpatient program (several hours a day, several days a week)  Offers depression assessments  Provides methadone maintenance program [phone] [address], [address] [place], [place] [address]   [facility]   Offers partial hospitalization/day treatment and DUI/DWI programs  [employer], private insurance [phone] [address], [address] [place], [place] [address]  [facility]    Services include intensive outpatient program (several hours a day, several days a week), outpatient treatment, DUI/DWI services, family education  Also has some services specifically for [employer] transitional housing  [phone] [address] [place], [place] [address]     [facility]  [employer], private pay, and private insurance [phone] [address] [place], [place] [address]  [employer] [facility]  Services include individual counseling, substance abuse intensive outpatient program (several hours a day, several days a week), day treatment  Delene Loll, Medicaid, private insurance  [phone] [address], [address] [place], [place] [address]  [facility]   Offers substance abuse intensive outpatient program (several hours a day, several days a week), partial hospitalization program [phone] [address] [place], [place] [address]  [phone] [address] [place], [place] [address]  [phone] [address] [place], [place] [address]  [phone], [address] [place], [place] [address]  [facility]  Individual counseling only  Accepts private insurance only [phone] [address], [address] [place], [place] [address]  [facility]  Methadone maintenance program [phone]  [address] [place], [place] [address]  [facility]  Walk-In Clinic providing substance abuse and mental health counseling  Accepts Medicaid, Medicare, private insurance  Offers sliding scale for uninsured [phone] [address] [place], [place]   [facility] [employer]  Offers individual counseling, and intensive in-home services [phone] [address], [address] [place], [place] [address]  [facility]  Offers individual counseling, family counseling, group therapy, domestic violence counseling, consumer credit counseling  Accepts Medicare, Medicaid, private insurance  Offers sliding scale for uninsured [phone] [address] [place], [place] [address]  [phone] [facility], [address] [place], [place] [address]  [facility]  Offers individual, family and group counseling  3 locations - [place], [place], and [place]  [phone]  [address] [place], [place] [address]  [address] [place], [place] [address]  [address] [place], [place] [address]  [facility]    Offers psychiatric assessment, 8-week Intensive Outpatient Program (several hours a day, several times a week, daytime or  evenings), early recovery group, family Program, medication management  Private pay or private insurance only [phone], or  [phone] [address] [place], [place] [address]  [facility]  Offers individual, couples and family counseling  Accepts Medicaid, private insurance, and sliding scale for uninsured [phone] [address] [place], [place] [address]  Len Blalockavid Fuller, MD  Individual counseling  Private insurance [phone] [address] [place], [place] [address]  [facility]   Offers assessment, substance abuse treatment, and behavioral health treatment [phone] [address] [place], [place] [address]  [facility]  Individual counseling  Accepts private insurance [phone] [address] [place], [place] [address]  [facility]  Individual counseling  Delene Lollccepts Medicare, private insurance [phone] [address] [place], [place] [address]  [facility]    Offers intensive outpatient program (several hours a day, several times a week)  Private pay, private insurance [phone] [facility] [place], [place]  [facility]  Individual counseling  Medicare, private insurance [phone] [address], [address] [place], [place] [address]  [facility]    Offers intensive outpatient program (several hours a day, several times a week) and partial hospitalization program [phone] [address] [place], [place] [address]  Emerson MonteParrish McKinney, MD  Individual counseling [phone] [address], [address] [place], [place] [address]  [facility]  Offers Christian counseling to individuals, couples, and families  Accepts Medicare and private insurance; offers sliding scale for uninsured [phone] [address] [place], [place] [address]  [facility]   [facility] [phone] [address], [address] [place], [place] [address]  [facility]   Offers crisis counseling, individual counseling, group therapy, in-home therapy, domestic violence services, day treatment, DWI services, Administrator, artsCommunity Support Team (CST), Assertive Community Treatment Team (ACTT), substance abuse Intensive Outpatient Program (several hours a day, several times a week)  2 locations - [place] and [place] [phone] [address] [place], [place] [address]  [phone] [address] [place], [place] [address]  [facility]     Individual counseling and group therapy  Accepts private insurance, [place]are, [place] [phone] [address] [place], [place]  [facility]  Offers individual and family counseling  Offers LGBTQ services  Accepts private insurance and private pay [phone] [address] [place], [place] [address]  [facility]    Offers individual counseling, group therapy, and  outpatient detox  Accepts private insurance [phone] [address] [place], [place]  [facility]  Individual counseling  Accepts Medicare, private insurance [phone] [address], [address] [place], [place] [address]  [employer]  Individual counseling  Accepts Medicare, private insurance [phone] [address] [place], [place] [address]  [facility] [address]   Offers substance abuse Intensive Outpatient Program (several hours a day, several times a week) [phone], or [phone] [place], [place]

## 2015-10-25 NOTE — ED Notes (Signed)
Patient is aware that we need a urine specimen. Urinal at bedside. 

## 2015-11-04 IMAGING — CT CT ABD-PELV W/ CM
1 of 3 series · 14 of 32 positions shown, 19 images · IV contrast (OMNIPAQUE 300)
Comparison: 08/22/2012

CLINICAL DATA: Sharp abdominal pain eating peanuts today. Peanut
allergy. History of duodenal ulcers.

EXAM:
CT ABDOMEN AND PELVIS WITH CONTRAST
TECHNIQUE: Multidetector CT imaging of the abdomen and pelvis was performed
using the standard protocol following bolus administration of
intravenous contrast.
CONTRAST:  50mL OMNIPAQUE IOHEXOL 300 MG/ML SOLN, 100mL OMNIPAQUE
IOHEXOL 300 MG/ML SOLN

[Series 2: abd/pel with · axial · 0.76mm/px · z∈[+1057,+1447]mm · 14 of 88 slices shown, 19 images]
[im 5/88  soft-tissue]
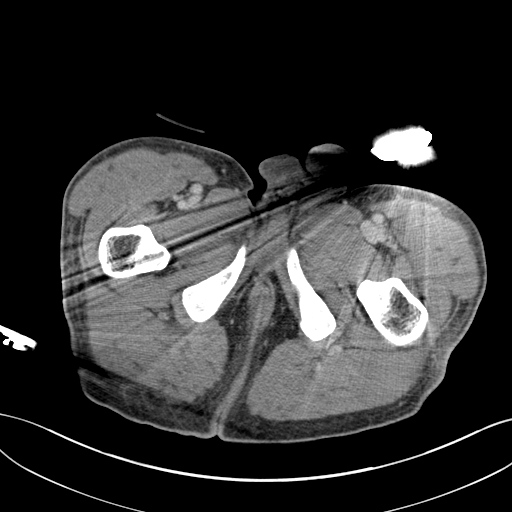
[im 5/88  bone]
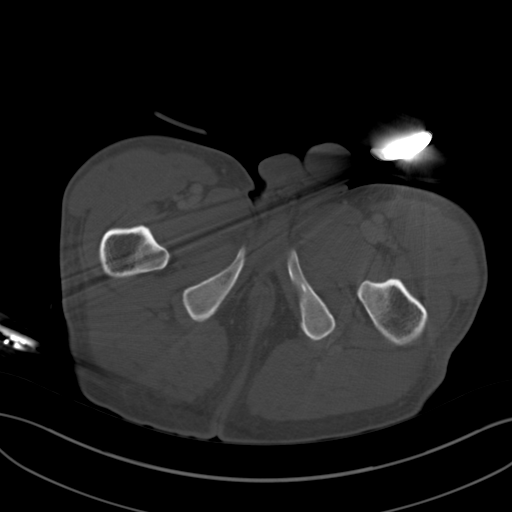
[im 14/88  soft-tissue]
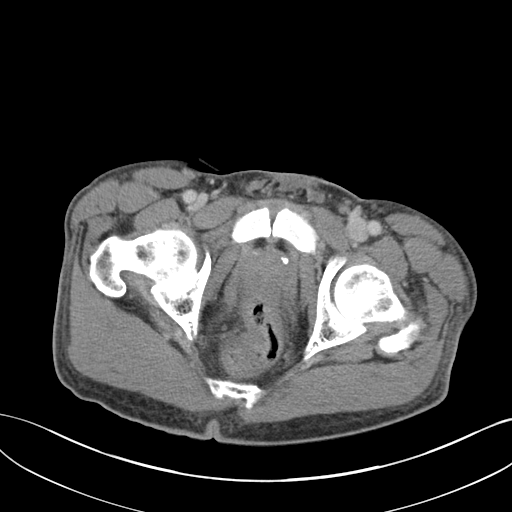
[im 19/88  soft-tissue]
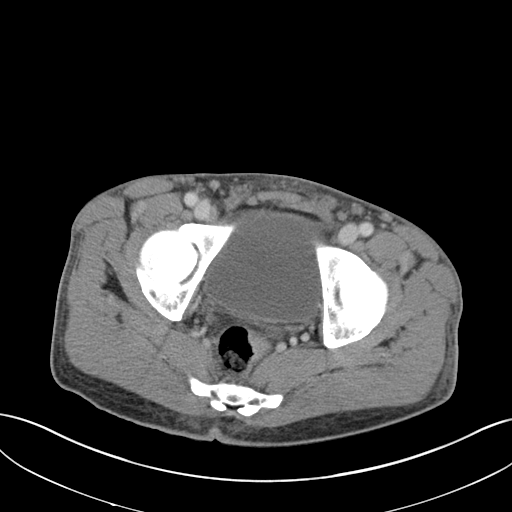
[im 23/88  soft-tissue]
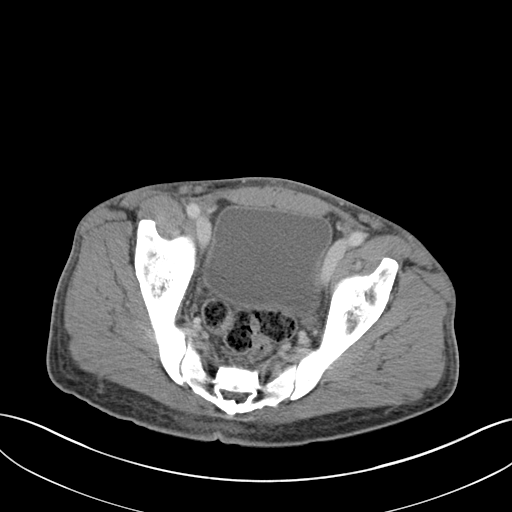
[im 33/88  soft-tissue]
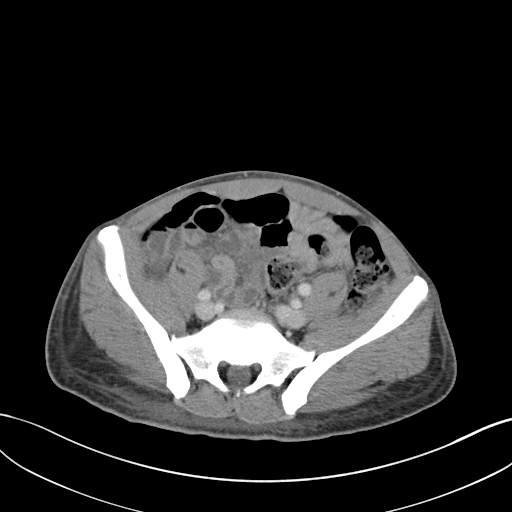
[im 37/88  soft-tissue]
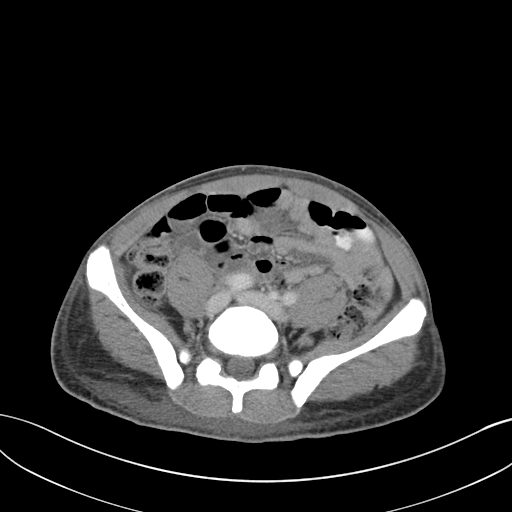
[im 46/88  soft-tissue]
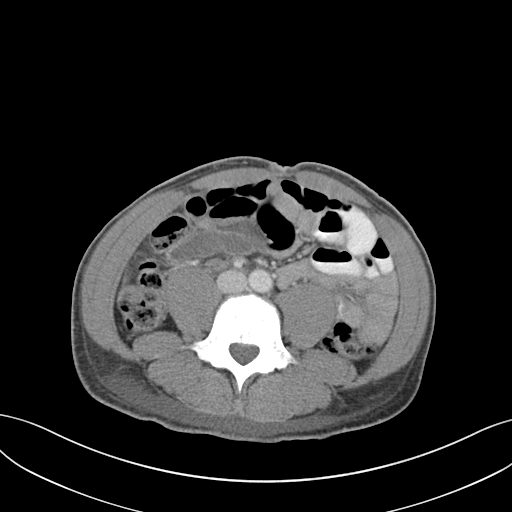
[im 51/88  soft-tissue]
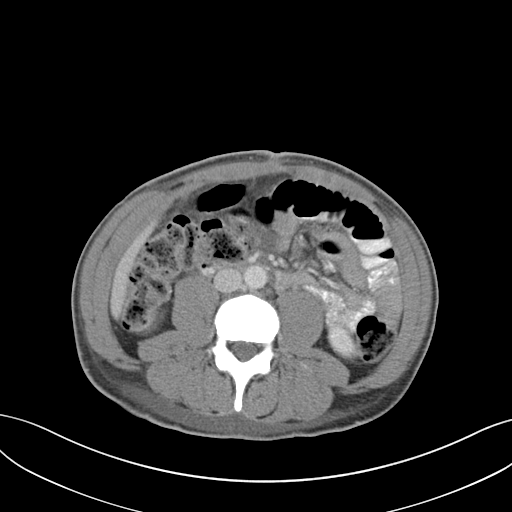
[im 55/88  soft-tissue]
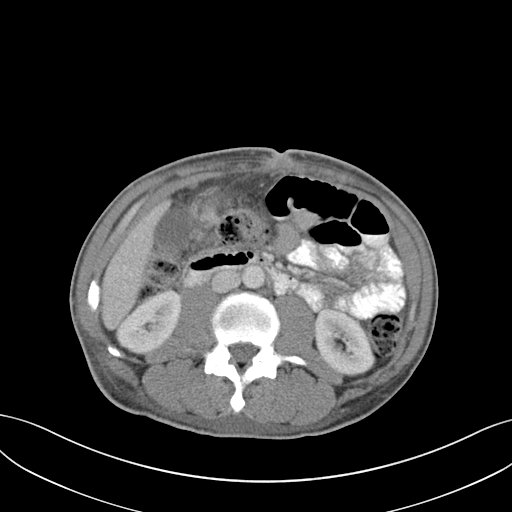
[im 55/88  bone]
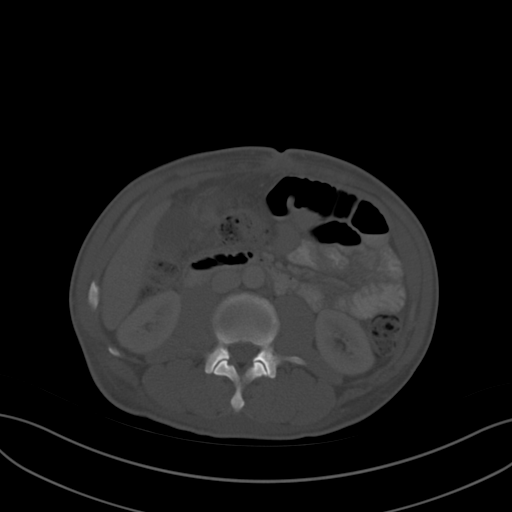
[im 65/88  soft-tissue]
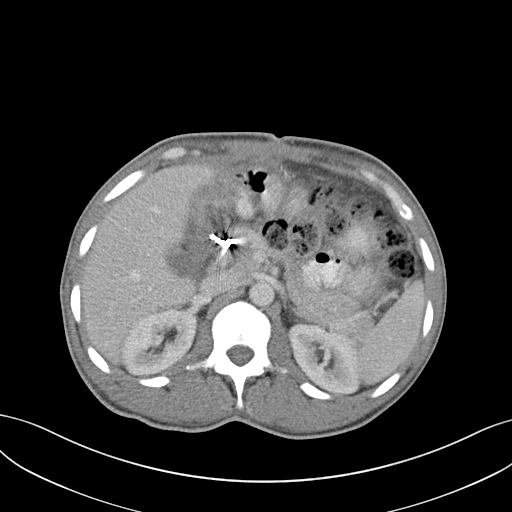
[im 69/88  soft-tissue]
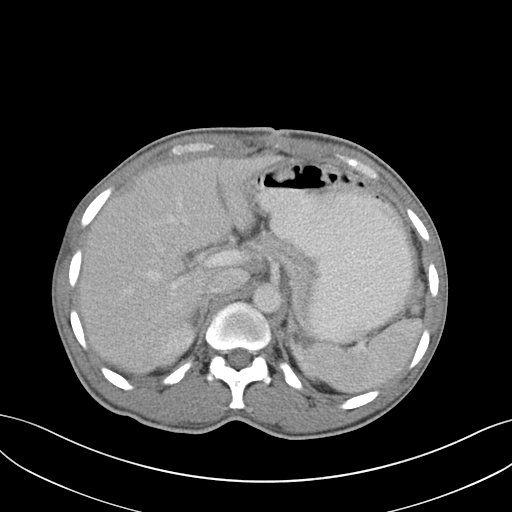
[im 69/88  lung]
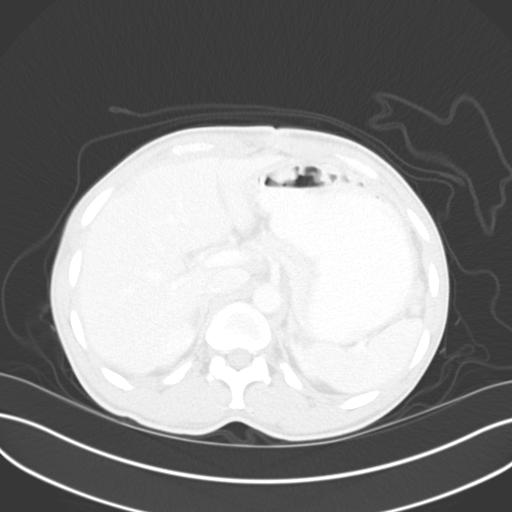
[im 74/88  soft-tissue]
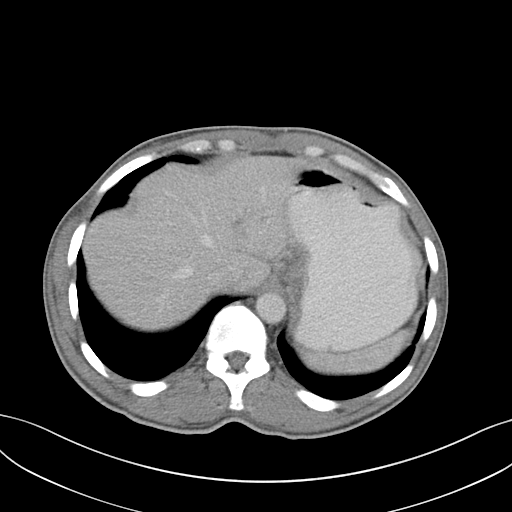
[im 74/88  lung]
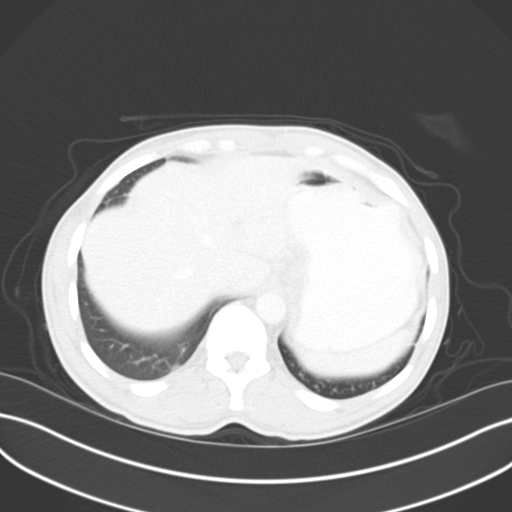
[im 78/88  lung]
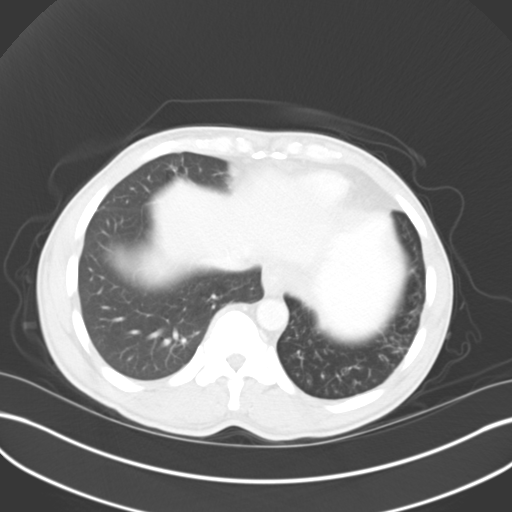
[im 83/88  soft-tissue]
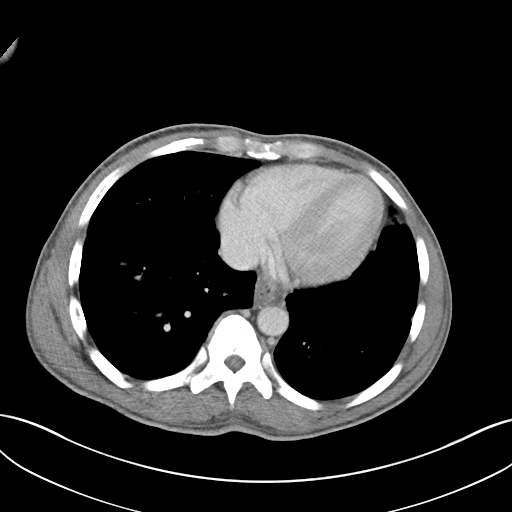
[im 83/88  lung]
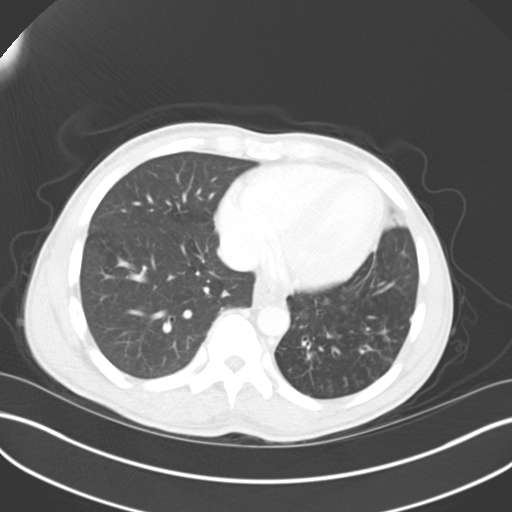

[14 of 32 positions shown; findings below may reference images not displayed]

FINDINGS: Tree-in-bud nodular ground-glass opacities noted in the left lower
lung, new since prior study. No effusions. Heart is normal size.
Scarring in the lingula, stable.

Liver, gallbladder, spleen, pancreas, adrenals and kidneys are
normal.

Metallic coils are noted in the right upper quadrant from prior
embolization. Adjacent to the coils, the proximal duodenum and
distal stomach appear thick walled with surrounding haziness. Small
gas collection noted adjacent to the coils. This is concerning for
gas with end in ulcer, likely within the proximal duodenum. No free
air.

Remainder of the small bowel is unremarkable. Moderate stool
throughout the colon which is otherwise unremarkable. No free fluid
or adenopathy. Aorta is normal caliber.

No acute bony abnormality or focal bone lesion.
IMPRESSION: Wall thickening in the region of the distal stomach and proximal
duodenum with surrounding haziness and indistinctness. Air
collection noted adjacent to coils within the right upper quadrant
may be within a proximal duodenal ulcer. No free air.

## 2016-04-02 ENCOUNTER — Emergency Department (HOSPITAL_COMMUNITY): Payer: Medicaid Other

## 2016-04-02 ENCOUNTER — Inpatient Hospital Stay (HOSPITAL_COMMUNITY)
Admission: EM | Admit: 2016-04-02 | Discharge: 2016-04-06 | DRG: 378 | Disposition: A | Discharge status: Home / Self Care | Payer: Medicaid Other | Attending: Internal Medicine | Admitting: Internal Medicine

## 2016-04-02 ENCOUNTER — Encounter (HOSPITAL_COMMUNITY): Payer: Self-pay | Admitting: *Deleted

## 2016-04-02 DIAGNOSIS — I10 Essential (primary) hypertension: Secondary | ICD-10-CM | POA: Diagnosis present

## 2016-04-02 DIAGNOSIS — F1010 Alcohol abuse, uncomplicated: Secondary | ICD-10-CM | POA: Diagnosis present

## 2016-04-02 DIAGNOSIS — K2210 Ulcer of esophagus without bleeding: Secondary | ICD-10-CM | POA: Diagnosis present

## 2016-04-02 DIAGNOSIS — Z91018 Allergy to other foods: Secondary | ICD-10-CM | POA: Diagnosis not present

## 2016-04-02 DIAGNOSIS — K922 Gastrointestinal hemorrhage, unspecified: Secondary | ICD-10-CM | POA: Diagnosis not present

## 2016-04-02 DIAGNOSIS — K264 Chronic or unspecified duodenal ulcer with hemorrhage: Secondary | ICD-10-CM | POA: Diagnosis present

## 2016-04-02 DIAGNOSIS — K2980 Duodenitis without bleeding: Secondary | ICD-10-CM | POA: Diagnosis not present

## 2016-04-02 DIAGNOSIS — K311 Adult hypertrophic pyloric stenosis: Secondary | ICD-10-CM | POA: Diagnosis present

## 2016-04-02 DIAGNOSIS — R1111 Vomiting without nausea: Secondary | ICD-10-CM | POA: Diagnosis present

## 2016-04-02 DIAGNOSIS — F17200 Nicotine dependence, unspecified, uncomplicated: Secondary | ICD-10-CM | POA: Diagnosis present

## 2016-04-02 DIAGNOSIS — R110 Nausea: Secondary | ICD-10-CM | POA: Diagnosis not present

## 2016-04-02 DIAGNOSIS — Z9114 Patient's other noncompliance with medication regimen: Secondary | ICD-10-CM | POA: Diagnosis not present

## 2016-04-02 DIAGNOSIS — D72829 Elevated white blood cell count, unspecified: Secondary | ICD-10-CM | POA: Diagnosis present

## 2016-04-02 DIAGNOSIS — K219 Gastro-esophageal reflux disease without esophagitis: Secondary | ICD-10-CM | POA: Diagnosis present

## 2016-04-02 DIAGNOSIS — K254 Chronic or unspecified gastric ulcer with hemorrhage: Secondary | ICD-10-CM | POA: Diagnosis present

## 2016-04-02 DIAGNOSIS — Z79899 Other long term (current) drug therapy: Secondary | ICD-10-CM

## 2016-04-02 DIAGNOSIS — K920 Hematemesis: Secondary | ICD-10-CM | POA: Diagnosis present

## 2016-04-02 DIAGNOSIS — F1011 Alcohol abuse, in remission: Secondary | ICD-10-CM

## 2016-04-02 DIAGNOSIS — K269 Duodenal ulcer, unspecified as acute or chronic, without hemorrhage or perforation: Secondary | ICD-10-CM

## 2016-04-02 DIAGNOSIS — E876 Hypokalemia: Secondary | ICD-10-CM | POA: Diagnosis present

## 2016-04-02 DIAGNOSIS — Z886 Allergy status to analgesic agent status: Secondary | ICD-10-CM | POA: Diagnosis not present

## 2016-04-02 DIAGNOSIS — D62 Acute posthemorrhagic anemia: Secondary | ICD-10-CM | POA: Diagnosis present

## 2016-04-02 LAB — CBC WITH DIFFERENTIAL/PLATELET
BASOS ABS: 0 10*3/uL (ref 0.0–0.1)
BASOS PCT: 0 %
Eosinophils Absolute: 0 10*3/uL (ref 0.0–0.7)
Eosinophils Relative: 0 %
HEMATOCRIT: 41.2 % (ref 39.0–52.0)
HEMOGLOBIN: 13.1 g/dL (ref 13.0–17.0)
Lymphocytes Relative: 3 %
Lymphs Abs: 0.5 10*3/uL — ABNORMAL LOW (ref 0.7–4.0)
MCH: 24.6 pg — ABNORMAL LOW (ref 26.0–34.0)
MCHC: 31.8 g/dL (ref 30.0–36.0)
MCV: 77.4 fL — ABNORMAL LOW (ref 78.0–100.0)
MONOS PCT: 6 %
Monocytes Absolute: 1 10*3/uL (ref 0.1–1.0)
NEUTROS ABS: 15.9 10*3/uL — AB (ref 1.7–7.7)
NEUTROS PCT: 91 %
Platelets: 239 10*3/uL (ref 150–400)
RBC: 5.32 MIL/uL (ref 4.22–5.81)
RDW: 14.6 % (ref 11.5–15.5)
WBC: 17.5 10*3/uL — ABNORMAL HIGH (ref 4.0–10.5)

## 2016-04-02 LAB — COMPREHENSIVE METABOLIC PANEL
ALBUMIN: 4 g/dL (ref 3.5–5.0)
ALK PHOS: 82 U/L (ref 38–126)
ALT: 14 U/L — ABNORMAL LOW (ref 17–63)
AST: 21 U/L (ref 15–41)
Anion gap: 9 (ref 5–15)
BILIRUBIN TOTAL: 0.7 mg/dL (ref 0.3–1.2)
BUN: 16 mg/dL (ref 6–20)
CO2: 30 mmol/L (ref 22–32)
CREATININE: 1.03 mg/dL (ref 0.61–1.24)
Calcium: 9 mg/dL (ref 8.9–10.3)
Chloride: 102 mmol/L (ref 101–111)
GFR calc Af Amer: >60 mL/min (ref >60)
GFR calc non Af Amer: >60 mL/min (ref >60)
GLUCOSE: 105 mg/dL — AB (ref 65–99)
Potassium: 3.5 mmol/L (ref 3.5–5.1)
Sodium: 141 mmol/L (ref 135–145)
TOTAL PROTEIN: 7.8 g/dL (ref 6.5–8.1)

## 2016-04-02 LAB — OCCULT BLOOD GASTRIC / DUODENUM (SPECIMEN CUP)
Occult Blood, Gastric: POSITIVE — AB
pH, Gastric: 2

## 2016-04-02 LAB — LIPASE, BLOOD: Lipase: 24 U/L (ref 11–51)

## 2016-04-02 MED ORDER — FENTANYL CITRATE (PF) 100 MCG/2ML IJ SOLN
50.0000 ug | Freq: Once | INTRAMUSCULAR | Status: AC
  Administered 2016-04-02: 50 ug via INTRAVENOUS
  Filled 2016-04-02: qty 2

## 2016-04-02 MED ORDER — PANTOPRAZOLE SODIUM 40 MG IV SOLR
40.0000 mg | Freq: Once | INTRAVENOUS | Status: AC
  Administered 2016-04-02: 40 mg via INTRAVENOUS
  Filled 2016-04-02: qty 40

## 2016-04-02 MED ORDER — ONDANSETRON HCL 4 MG/2ML IJ SOLN
4.0000 mg | Freq: Once | INTRAMUSCULAR | Status: AC
  Administered 2016-04-02: 4 mg via INTRAVENOUS
  Filled 2016-04-02: qty 2

## 2016-04-02 MED ORDER — IOPAMIDOL (ISOVUE-300) INJECTION 61%
100.0000 mL | Freq: Once | INTRAVENOUS | Status: AC | PRN
  Administered 2016-04-02: 100 mL via INTRAVENOUS

## 2016-04-02 MED ORDER — FAMOTIDINE IN NACL 20-0.9 MG/50ML-% IV SOLN
20.0000 mg | Freq: Once | INTRAVENOUS | Status: AC
  Administered 2016-04-02: 20 mg via INTRAVENOUS
  Filled 2016-04-02: qty 50

## 2016-04-02 MED ORDER — PIPERACILLIN-TAZOBACTAM 3.375 G IVPB 30 MIN
3.3750 g | Freq: Once | INTRAVENOUS | Status: AC
  Administered 2016-04-02: 3.375 g via INTRAVENOUS
  Filled 2016-04-02: qty 50

## 2016-04-02 MED ORDER — SODIUM CHLORIDE 0.9 % IV BOLUS (SEPSIS)
1000.0000 mL | Freq: Once | INTRAVENOUS | Status: AC
  Administered 2016-04-02: 1000 mL via INTRAVENOUS

## 2016-04-02 MED ORDER — PIPERACILLIN-TAZOBACTAM 3.375 G IVPB
3.3750 g | Freq: Three times a day (TID) | INTRAVENOUS | Status: DC
  Administered 2016-04-03 – 2016-04-04 (×4): 3.375 g via INTRAVENOUS
  Filled 2016-04-02 (×4): qty 50

## 2016-04-02 MED ORDER — GI COCKTAIL ~~LOC~~
30.0000 mL | Freq: Once | ORAL | Status: AC
  Administered 2016-04-02: 30 mL via ORAL
  Filled 2016-04-02: qty 30

## 2016-04-02 NOTE — H&P (Signed)
History and Physical    Ryan GradRandy Willmore XBJ:[id number] DOB: 08/02/1960 DOA: 04/02/2016  PCP: Jackie PlumSEI-BONSU,GEORGE, MD   Patient coming from: Home  Chief Complaint: Coffee ground emesis  HPI: [name] is a 55 y.o. gentleman with a history of active polysubstance abuse, duodenal ulcers with bleeding, recurrent upper GI bleeds requiring blood transfusion (he actually went to the OR in March 2016 for intractable bleeding from a duodenal ulcer), and GERD who presented to the ED today for evaluation of coffee ground emesis that started last night. This is associated with epigastric abdominal pain.  No documented fever, but he has had sweats.  He denies light-headedness, syncope, chest pain, or shortness of breath.  No diarrhea.  No hematuria.  ED Course: CT scan shows multiple findings including thickened esophageal wall, inflammatory changes around the duodenum with a small fluid collection, and progressive changes in the RUQ (neoplasm could not be excluded).  He has a WBC count of 17.  He received 1L of NS, IV pepcid, IV protonix (40mg ), GI cocktail, and zofran.  Hemoglobin actually within normal limits on presentation.  He his actually hypertensive on presentation.  Hospitalist asked to admit for management of recurrent upper GI bleed.  GI consultation requested from the ED.  Review of Systems: As per HPI otherwise 10 point review of systems negative.    Past Medical History:  Diagnosis Date  . Abdominal pain, other specified site   . Abnormal CT scan, esophagus 03/2012  . GERD (gastroesophageal reflux disease)   . Substance abuse 03/2012   tox screen positive for cocaine.     Past Surgical History:  Procedure Laterality Date  . ESOPHAGOGASTRODUODENOSCOPY  08/10/2012   Procedure: ESOPHAGOGASTRODUODENOSCOPY (EGD);  Surgeon: Rachael Feeaniel P Jacobs, MD;  [facility];  Service: Endoscopy;  Laterality: N/A;  will be done in room 1   . ESOPHAGOGASTRODUODENOSCOPY Left 05/01/2013   Procedure:  ESOPHAGOGASTRODUODENOSCOPY (EGD);  Surgeon: Charna ElizabethJyothi Mann, MD;  Location: [facility] ENDOSCOPY;  Service: Endoscopy;  Laterality: Left;  . ESOPHAGOGASTRODUODENOSCOPY N/A 05/11/2013   Procedure: ESOPHAGOGASTRODUODENOSCOPY (EGD);  Surgeon: Graylin ShiverSalem F Ganem, MD;  [facility];  Service: Endoscopy;  Laterality: N/A;  . ESOPHAGOGASTRODUODENOSCOPY N/A 10/06/2014   Procedure: ESOPHAGOGASTRODUODENOSCOPY (EGD);  Surgeon: Beverley FiedlerJay M Pyrtle, MD;  [facility];  Service: Endoscopy;  Laterality: N/A;  . ESOPHAGOGASTRODUODENOSCOPY N/A 10/09/2014   Procedure: ESOPHAGOGASTRODUODENOSCOPY (EGD);  Surgeon: Beverley FiedlerJay M Pyrtle, MD;  [facility];  Service: Gastroenterology;  Laterality: N/A;  Bedside  . LAPAROTOMY  08/10/2012   Procedure: EXPLORATORY LAPAROTOMY;  Surgeon: Cherylynn RidgesJames O Wyatt, MD;  [facility];  Service: General;  Laterality: N/A;  . LAPAROTOMY N/A 10/10/2014   Procedure: EXPLORATORY LAPAROTOMY WITH GASTROPATHY AND OVER SEWEN DUODENAL ULCER REGION ;  Surgeon: Luretha MurphyMatthew Martin, MD;  Location: [facility] ORS;  Service: General;  Laterality: N/A;  . undescended testicle     on right. noted on 03/2012 CT scan.  no surgery referral.      reports that he has been smoking.  He has a 34.00 pack-year smoking history. He has never used smokeless tobacco. He reports that he drinks alcohol. He reports that he uses drugs, including Marijuana.  He tells me that he no longer uses cocaine.  He has one adult daughter.  Allergies  Allergen Reactions  . Nsaids Other (See Comments)    BLEEDING ULCER  . Chocolate Nausea And Vomiting  . Peanut-Containing Drug Products Nausea And Vomiting  . Spinach Nausea And Vomiting    FAMILY HISTORY: His father died of  complications related to gallbladder disease. His mother died of complications related to lung cancer, but she never smoked. One sister is also deceased; cause unknown.  Prior to Admission medications   Not on File    Physical Exam: Vitals:   04/02/16 1641 04/02/16  1842 04/02/16 2056 04/02/16 2114  BP: (!) 167/106 142/86 148/91   Pulse: 85 81 72   Resp: 18 16 17    Temp:   99.4 F (37.4 C)   TempSrc:   Oral   SpO2: 97% 97% 99%   Weight:    77.1 kg (170 lb)  Height:    5\' 9"  (1.753 m)      Constitutional: NAD, calm, comfortable though he appears older than stated age Vitals:   04/02/16 1641 04/02/16 1842 04/02/16 2056 04/02/16 2114  BP: (!) 167/106 142/86 148/91   Pulse: 85 81 72   Resp: 18 16 17    Temp:   99.4 F (37.4 C)   TempSrc:   Oral   SpO2: 97% 97% 99%   Weight:    77.1 kg (170 lb)  Height:    5\' 9"  (1.753 m)   Eyes: PERRL, lids and conjunctivae normal ENMT: Mucous membranes are DRY. Posterior pharynx clear of any exudate or lesions. Normal dentition.  Neck: normal appearance, supple, no masses Respiratory: clear to auscultation bilaterally, no wheezing, no crackles. Normal respiratory effort. No accessory muscle use.  Cardiovascular: Normal rate, regular rhythm, no murmurs / rubs / gallops. No extremity edema. 2+ pedal pulses. No carotid bruits.  GI: abdomen is firm but not particularly distended or tender.  No significant guarding.  No rebound tenderness.  Bowel sounds are hypoactive. Musculoskeletal:  No joint deformity in upper and lower extremities. Good ROM, no contractures. Normal muscle tone.  Skin: no rashes, warm and dry Neurologic: CN 2-12 grossly intact. Sensation intact, Strength symmetric bilaterally, 5/5  Psychiatric: Normal judgment and insight. Alert and oriented x 3. Normal mood.     Labs on Admission: I have personally reviewed following labs and imaging studies  CBC:  Recent Labs Lab 04/02/16 1500  WBC 17.5*  NEUTROABS 15.9*  HGB 13.1  HCT 41.2  MCV 77.4*  PLT 239   Basic Metabolic Panel:  Recent Labs Lab 04/02/16 1500  NA 141  K 3.5  CL 102  CO2 30  GLUCOSE 105*  BUN 16  CREATININE 1.03  CALCIUM 9.0   GFR: Estimated Creatinine Clearance: 81 mL/min (by C-G formula based on SCr of  1.03 mg/dL). Liver Function Tests:  Recent Labs Lab 04/02/16 1500  AST 21  ALT 14*  ALKPHOS 82  BILITOT 0.7  PROT 7.8  ALBUMIN 4.0    Recent Labs Lab 04/02/16 1500  LIPASE 24    Radiological Exams on Admission: Ct Abdomen Pelvis W Contrast  Result Date: 04/02/2016 CLINICAL DATA:  Coffee-ground emesis since last night. Mid abdominal pain beginning last night. Alcohol use. EXAM: CT ABDOMEN AND PELVIS WITH CONTRAST TECHNIQUE: Multidetector CT imaging of the abdomen and pelvis was performed using the standard protocol following bolus administration of intravenous contrast. CONTRAST:  ISOVUE-300 IOPAMIDOL (ISOVUE-300) INJECTION 61% COMPARISON:  06/19/2015 FINDINGS: Lung bases are clear. The distal esophagus demonstrates diffuse wall thickening and edema. This may reflect changes of esophagitis although esophageal neoplasm is not excluded. Focal lymphadenopathy adjacent to the esophagus. Consider endoscopy for additional evaluation. The stomach is distended without discrete wall thickening. Small bowel are not abnormally distended. Mild wall thickening demonstrated in the duodenum suggesting duodenitis. Changes may  reflect peptic ulcer disease. Gas and fluid collection adjacent to the duodenum ball could represent a diverticulum or a penetrating ulcer. Again demonstrated is inflammatory stranding adjacent to the duodenum with enlarged regional enhancing lymph nodes. While this may represent recurrent inflammatory process, neoplasm should be excluded. The gallbladder, bile ducts, and pancreatic duct are unremarkable. Liver, spleen, adrenal glands, kidneys, abdominal aorta, inferior vena cava, and retroperitoneal lymph nodes are unremarkable. Stool and gas filled colon without distention or wall thickening. No free air or free fluid demonstrated in the abdomen. Pelvis: The appendix is normal. Bladder wall is not thickened. Prostate gland is not enlarged. No free or loculated pelvic fluid  collections. No pelvic mass or lymphadenopathy. No destructive bone lesions. IMPRESSION: Persistent stranding inflammatory changes around the duodenum with a small fluid collection either representing duodenum diverticulum or penetrating ulcer. Enlarged enhancing lymph nodes in the area. Stomach is distended. Prominent thick-walled distal esophagus. The right upper quadrant changes are persisting from previous study and demonstrating progression. This could represent recurrent infection. However, neoplasm should be excluded due to persistence of findings. The distal esophageal wall thickening is significantly increased since previous study and may represent interval development of severe esophagitis or esophageal tumor could be present as well. Consider endoscopy for further evaluation of these changes if clinically indicated. Electronically Signed   By: Burman Nieves M.D.   On: 04/02/2016 18:17     Assessment/Plan Principal Problem:   Acute upper GI bleed Active Problems:   Hematemesis   Hypertension   Leukocytosis   Duodenal ulcer disease   Duodenitis   GI bleed      Acute upper GI bleed, history of duodenal ulcers, possible duodenitis on CT.  Infection and/or neoplasm not excluded. --Admit to stepdown unit due to high risk for hemodynamic compromise --H/H upon arrival to the floor and again in the morning --Type and screen --Avoid blood thinners and NSAIDs --Anti-emetics as needed --IV protonix 40mg  IV x one for a total of 80mg  via bolus then start continuous infusion at 8mg /hr per protocol --GI consult requested from the ED; expect he will be seen in the AM and patient has been advised of this.  He may also need surgical consultation again --empiric Zosyn for now since intraabdominal infection could not be ruled out and he has a leukocytosis.  Overall, he does not appear septic at this time. --NPO except sips with meds for now --Gentle IV fluid resuscitation  Accelerated  HTN --IV hydralazine prn, will likely need an oral agent once he demonstrates that bleeding is contained     DVT prophylaxis: SCDs Code Status: FULL Family Communication: Patient alone in the ED at time of admission Disposition Plan: Expect he will go home when ready for discharge Consults called: GI Admission status: Stepdown, inpatient   TIME SPENT: 70 minutes   Jerene Bears MD [facility] Pager [phone]  If 7PM-7AM, please contact night-coverage [email or web address] Password [email or web address]  04/02/2016, 9:21 PM

## 2016-04-02 NOTE — ED Provider Notes (Signed)
[facility]-EMERGENCY DEPT Provider Note   CSN: [id number] Arrival date & time: 04/02/16  1331     History   Chief Complaint Chief Complaint  Patient presents with  . Emesis    HPI Ryan GradRandy Benison is a 55 y.o. male.  The history is provided by the patient. No language interpreter was used.  Emesis     [name] is a 55 y.o. male who presents to the Emergency Department complaining of vomiting.  He reports vomiting since last night, multiple episodes of dark vomit that appears to be bloody. He has some epigastric and right upper quadrant discomfort that he describes as burning. He denies any fevers, chest pain, diarrhea, black or bloody stools. He drinks occasionally, last drink yesterday.  Past Medical History:  Diagnosis Date  . Abdominal pain, other specified site   . Abnormal CT scan, esophagus 03/2012  . GERD (gastroesophageal reflux disease)   . Substance abuse 03/2012   tox screen positive for cocaine.     Patient Active Problem List   Diagnosis Date Noted  . Personality disorder 10/20/2014  . Acute upper GI bleed   . Ileus, postoperative 10/15/2014  . Aspiration pneumonia (HCC) 10/14/2014  . Polysubstance abuse 10/09/2014  . UGIB (upper gastrointestinal bleed) 10/08/2014  . Leukocytosis 04/30/2013  . Microcytic anemia 04/30/2013  . Anemia due to GI blood loss 08/10/2012  . Acute respiratory failure (HCC) 08/10/2012  . Hypertension 08/10/2012  . Duodenal ulcer with hemorrhage s/p oversew 10/11/2014 08/10/2012  . Encephalopathy acute 08/10/2012    Past Surgical History:  Procedure Laterality Date  . ESOPHAGOGASTRODUODENOSCOPY  08/10/2012   Procedure: ESOPHAGOGASTRODUODENOSCOPY (EGD);  Surgeon: Rachael Feeaniel P Jacobs, MD;  [facility];  Service: Endoscopy;  Laterality: N/A;  will be done in room 1   . ESOPHAGOGASTRODUODENOSCOPY Left 05/01/2013   Procedure: ESOPHAGOGASTRODUODENOSCOPY (EGD);  Surgeon: Charna ElizabethJyothi Mann, MD;  Location: [facility] ENDOSCOPY;  Service: Endoscopy;   Laterality: Left;  . ESOPHAGOGASTRODUODENOSCOPY N/A 05/11/2013   Procedure: ESOPHAGOGASTRODUODENOSCOPY (EGD);  Surgeon: Graylin ShiverSalem F Ganem, MD;  [facility];  Service: Endoscopy;  Laterality: N/A;  . ESOPHAGOGASTRODUODENOSCOPY N/A 10/06/2014   Procedure: ESOPHAGOGASTRODUODENOSCOPY (EGD);  Surgeon: Beverley FiedlerJay M Pyrtle, MD;  [facility];  Service: Endoscopy;  Laterality: N/A;  . ESOPHAGOGASTRODUODENOSCOPY N/A 10/09/2014   Procedure: ESOPHAGOGASTRODUODENOSCOPY (EGD);  Surgeon: Beverley FiedlerJay M Pyrtle, MD;  [facility];  Service: Gastroenterology;  Laterality: N/A;  Bedside  . LAPAROTOMY  08/10/2012   Procedure: EXPLORATORY LAPAROTOMY;  Surgeon: Cherylynn RidgesJames O Wyatt, MD;  [facility];  Service: General;  Laterality: N/A;  . LAPAROTOMY N/A 10/10/2014   Procedure: EXPLORATORY LAPAROTOMY WITH GASTROPATHY AND OVER SEWEN DUODENAL ULCER REGION ;  Surgeon: Luretha MurphyMatthew Martin, MD;  Location: [facility] ORS;  Service: General;  Laterality: N/A;  . undescended testicle     on right. noted on 03/2012 CT scan.  no surgery referral.        Home Medications    Prior to Admission medications   Medication Sig Start Date End Date Taking? Authorizing Provider  acetaminophen (TYLENOL) 325 MG tablet Take 2 tablets (650 mg total) by mouth every 4 (four) hours as needed for mild pain, moderate pain, fever or headache. 10/20/14   Jeralyn BennettEzequiel Zamora, MD  ferrous sulfate 325 (65 FE) MG tablet Take 1 tablet (325 mg total) by mouth daily after supper. 10/20/14   Jeralyn BennettEzequiel Zamora, MD  HYDROcodone-acetaminophen (NORCO/VICODIN) 5-325 MG per tablet Take 1 tablet by mouth every 4 (four) hours as needed for moderate pain. 10/20/14   Ezequiel  Vanessa Barbara, MD  ondansetron (ZOFRAN ODT) 8 MG disintegrating tablet Take 1 tablet (8 mg total) by mouth every 8 (eight) hours as needed for nausea or vomiting. 06/19/15   Tatyana Kirichenko, PA-C  pantoprazole (PROTONIX) 40 MG tablet Take 1 tablet (40 mg total) by mouth 2 (two) times daily. 10/20/14   Jeralyn Bennett, MD  pantoprazole (PROTONIX) 40 MG tablet Take 1 tablet (40 mg total) by mouth 2 (two) times daily. 10/25/14   Elwin Mocha, MD  pantoprazole (PROTONIX) 40 MG tablet Take 1 tablet (40 mg total) by mouth 2 (two) times daily. 10/25/15   Cathren Laine, MD  saccharomyces boulardii (FLORASTOR) 250 MG capsule Take 1 capsule (250 mg total) by mouth 2 (two) times daily. 10/20/14   Jeralyn Bennett, MD  traMADol (ULTRAM) 50 MG tablet Take 1 tablet (50 mg total) by mouth every 6 (six) hours as needed. 06/19/15   Jaynie Crumble, PA-C    Family History No family history on file.  Social History Social History  Substance Use Topics  . Smoking status: Light Tobacco Smoker    Packs/day: 1.00    Years: 34.00  . Smokeless tobacco: Never Used  . Alcohol use Yes     Comment: 40oz beer/day     Allergies   Nsaids; Chocolate; Peanut-containing drug products; and Spinach   Review of Systems Review of Systems  Gastrointestinal: Positive for vomiting.  All other systems reviewed and are negative.    Physical Exam Updated Vital Signs BP 142/86 (BP Location: Left Arm)   Pulse 81   Temp 98 F (36.7 C) (Oral)   Resp 16   SpO2 97%   Physical Exam  Constitutional: He is oriented to person, place, and time. He appears well-developed and well-nourished.  HENT:  Head: Normocephalic and atraumatic.  Cardiovascular: Normal rate and regular rhythm.   No murmur heard. Pulmonary/Chest: Effort normal and breath sounds normal. No respiratory distress.  Abdominal: Soft. There is no tenderness. There is no rebound and no guarding.  Musculoskeletal: He exhibits no edema or tenderness.  Neurological: He is alert and oriented to person, place, and time.  Skin: Skin is warm and dry.  Psychiatric: He has a normal mood and affect. His behavior is normal.  Nursing note and vitals reviewed.    ED Treatments / Results  Labs (all labs ordered are listed, but only abnormal results are displayed) Labs  Reviewed  CBC WITH DIFFERENTIAL/PLATELET - Abnormal; Notable for the following:       Result Value   WBC 17.5 (*)    MCV 77.4 (*)    MCH 24.6 (*)    Neutro Abs 15.9 (*)    Lymphs Abs 0.5 (*)    All other components within normal limits  COMPREHENSIVE METABOLIC PANEL - Abnormal; Notable for the following:    Glucose, Bld 105 (*)    ALT 14 (*)    All other components within normal limits  OCCULT BLOOD GASTRIC / DUODENUM (SPECIMEN CUP) - Abnormal; Notable for the following:    Occult Blood, Gastric POSITIVE (*)    All other components within normal limits  LIPASE, BLOOD  SAMPLE TO BLOOD BANK    EKG  EKG Interpretation None       Radiology Ct Abdomen Pelvis W Contrast  Result Date: 04/02/2016 CLINICAL DATA:  Coffee-ground emesis since last night. Mid abdominal pain beginning last night. Alcohol use. EXAM: CT ABDOMEN AND PELVIS WITH CONTRAST TECHNIQUE: Multidetector CT imaging of the abdomen and pelvis was performed using  the standard protocol following bolus administration of intravenous contrast. CONTRAST:  ISOVUE-300 IOPAMIDOL (ISOVUE-300) INJECTION 61% COMPARISON:  06/19/2015 FINDINGS: Lung bases are clear. The distal esophagus demonstrates diffuse wall thickening and edema. This may reflect changes of esophagitis although esophageal neoplasm is not excluded. Focal lymphadenopathy adjacent to the esophagus. Consider endoscopy for additional evaluation. The stomach is distended without discrete wall thickening. Small bowel are not abnormally distended. Mild wall thickening demonstrated in the duodenum suggesting duodenitis. Changes may reflect peptic ulcer disease. Gas and fluid collection adjacent to the duodenum ball could represent a diverticulum or a penetrating ulcer. Again demonstrated is inflammatory stranding adjacent to the duodenum with enlarged regional enhancing lymph nodes. While this may represent recurrent inflammatory process, neoplasm should be excluded. The  gallbladder, bile ducts, and pancreatic duct are unremarkable. Liver, spleen, adrenal glands, kidneys, abdominal aorta, inferior vena cava, and retroperitoneal lymph nodes are unremarkable. Stool and gas filled colon without distention or wall thickening. No free air or free fluid demonstrated in the abdomen. Pelvis: The appendix is normal. Bladder wall is not thickened. Prostate gland is not enlarged. No free or loculated pelvic fluid collections. No pelvic mass or lymphadenopathy. No destructive bone lesions. IMPRESSION: Persistent stranding inflammatory changes around the duodenum with a small fluid collection either representing duodenum diverticulum or penetrating ulcer. Enlarged enhancing lymph nodes in the area. Stomach is distended. Prominent thick-walled distal esophagus. The right upper quadrant changes are persisting from previous study and demonstrating progression. This could represent recurrent infection. However, neoplasm should be excluded due to persistence of findings. The distal esophageal wall thickening is significantly increased since previous study and may represent interval development of severe esophagitis or esophageal tumor could be present as well. Consider endoscopy for further evaluation of these changes if clinically indicated. Electronically Signed   By: Burman Nieves M.D.   On: 04/02/2016 18:17    Procedures Procedures (including critical care time)  Medications Ordered in ED Medications  famotidine (PEPCID) IVPB 20 mg premix (0 mg Intravenous Stopped 04/02/16 1724)  gi cocktail (Maalox,Lidocaine,Donnatal) (30 mLs Oral Given 04/02/16 1638)  fentaNYL (SUBLIMAZE) injection 50 mcg (50 mcg Intravenous Given 04/02/16 1724)  ondansetron (ZOFRAN) injection 4 mg (4 mg Intravenous Given 04/02/16 1724)  sodium chloride 0.9 % bolus 1,000 mL (1,000 mLs Intravenous New Bag/Given 04/02/16 1724)  pantoprazole (PROTONIX) injection 40 mg (40 mg Intravenous Given 04/02/16 1724)  iopamidol  (ISOVUE-300) 61 % injection 100 mL (100 mLs Intravenous Contrast Given 04/02/16 1743)     Initial Impression / Assessment and Plan / ED Course  I have reviewed the triage vital signs and the nursing notes.  Pertinent labs & imaging results that were available during my care of the patient were reviewed by me and considered in my medical decision making (see chart for details).  Clinical Course    Pt here with epigastric pain, vomiting.  Coffee ground emesis in ED.  He is hemodynamically stable in ED but does have persistent vomiting.  His abdominal exam is minimally tender with no peritoneal findings.  Plan to admit to hospitalist service for further treatment.  Discussed the patient with Dr. Lavon Paganini with GI who will see the patient in consult.    Final Clinical Impressions(s) / ED Diagnoses   Final diagnoses:  Acute upper GI bleed    New Prescriptions New Prescriptions   No medications on file     Tilden Fossa, MD 04/02/16 1912

## 2016-04-02 NOTE — ED Notes (Signed)
Bed: [id number] Expected date: 04/02/16 Expected time: 1:38 PM Means of arrival:  Comments: N/V

## 2016-04-02 NOTE — ED Notes (Signed)
Floor called pt ok top come up after midnight

## 2016-04-02 NOTE — ED Notes (Signed)
Provider in the room ( dr. Montez Moritaarter) assessment conducted in progress

## 2016-04-02 NOTE — ED Triage Notes (Signed)
Per EMS - patient comes from home with coffee ground emesis since last night.  Patient also has mid-abdominal pain that began last night, improved some, but started again this morning.  Patient had "a couple beers" yesterday.  Patient's BP 186/128, HR 92.  Patient had 100 mcg of Fentanyl and 4 mg of Zofran en route.  Pain is now 4/10 down from 8/10.

## 2016-04-02 NOTE — Progress Notes (Signed)
Pharmacy Antibiotic Note  [name] is a 55 y.o. male presents on 04/02/16 with abdominal pain and coffee ground emesis.  Pharmacy has been consulted for Zosyn dosing for intra-abdominal infection.   CrCl > 20 ml/min  Plan: Zosyn 3.375g IV q8h (infuse over 4 hours) F/u cultures, renal fxn, clinical course    Temp (24hrs), Avg:98 F (36.7 C), Min:98 F (36.7 C), Max:98 F (36.7 C)   Recent Labs Lab 04/02/16 1500  WBC 17.5*  CREATININE 1.03    CrCl cannot be calculated (Unknown ideal weight.).    Allergies  Allergen Reactions  . Nsaids Other (See Comments)    BLEEDING ULCER  . Chocolate Nausea And Vomiting  . Peanut-Containing Drug Products Nausea And Vomiting  . Spinach Nausea And Vomiting    Antimicrobials this admission: 9/2 Zosyn >>   Dose adjustments this admission:  Microbiology results:  Thank you for allowing pharmacy to be a part of this patient's care.  Haynes Hoehnolleen Christopher Hink, PharmD, BCPS 04/02/2016, 7:13 PM  Pager: [phone]

## 2016-04-03 ENCOUNTER — Inpatient Hospital Stay (HOSPITAL_COMMUNITY): Payer: Medicaid Other | Admitting: Anesthesiology

## 2016-04-03 ENCOUNTER — Encounter (HOSPITAL_COMMUNITY)
Admission: EM | Disposition: A | Discharge status: Home / Self Care | Payer: Self-pay | Source: Home / Self Care | Attending: Internal Medicine

## 2016-04-03 ENCOUNTER — Encounter (HOSPITAL_COMMUNITY): Payer: Self-pay | Admitting: Anesthesiology

## 2016-04-03 DIAGNOSIS — R110 Nausea: Secondary | ICD-10-CM

## 2016-04-03 DIAGNOSIS — K922 Gastrointestinal hemorrhage, unspecified: Secondary | ICD-10-CM

## 2016-04-03 DIAGNOSIS — K920 Hematemesis: Principal | ICD-10-CM

## 2016-04-03 DIAGNOSIS — K269 Duodenal ulcer, unspecified as acute or chronic, without hemorrhage or perforation: Secondary | ICD-10-CM

## 2016-04-03 HISTORY — PX: ESOPHAGOGASTRODUODENOSCOPY: SHX5428

## 2016-04-03 LAB — APTT: aPTT: 30 seconds (ref 24–36)

## 2016-04-03 LAB — PROTIME-INR
INR: 1.06
PROTHROMBIN TIME: 13.8 s (ref 11.4–15.2)

## 2016-04-03 LAB — CBC
HCT: 36.1 % — ABNORMAL LOW (ref 39.0–52.0)
Hemoglobin: 11.7 g/dL — ABNORMAL LOW (ref 13.0–17.0)
MCH: 24.9 pg — AB (ref 26.0–34.0)
MCHC: 32.4 g/dL (ref 30.0–36.0)
MCV: 76.8 fL — AB (ref 78.0–100.0)
PLATELETS: 196 10*3/uL (ref 150–400)
RBC: 4.7 MIL/uL (ref 4.22–5.81)
RDW: 14.6 % (ref 11.5–15.5)
WBC: 15.4 10*3/uL — ABNORMAL HIGH (ref 4.0–10.5)

## 2016-04-03 LAB — COMPREHENSIVE METABOLIC PANEL
ALT: 12 U/L — AB (ref 17–63)
AST: 17 U/L (ref 15–41)
Albumin: 3.2 g/dL — ABNORMAL LOW (ref 3.5–5.0)
Alkaline Phosphatase: 64 U/L (ref 38–126)
Anion gap: 6 (ref 5–15)
BILIRUBIN TOTAL: 0.8 mg/dL (ref 0.3–1.2)
BUN: 14 mg/dL (ref 6–20)
CHLORIDE: 110 mmol/L (ref 101–111)
CO2: 26 mmol/L (ref 22–32)
CREATININE: 1.05 mg/dL (ref 0.61–1.24)
Calcium: 8.5 mg/dL — ABNORMAL LOW (ref 8.9–10.3)
Glucose, Bld: 88 mg/dL (ref 65–99)
POTASSIUM: 3.8 mmol/L (ref 3.5–5.1)
Sodium: 142 mmol/L (ref 135–145)
TOTAL PROTEIN: 6.3 g/dL — AB (ref 6.5–8.1)

## 2016-04-03 LAB — SAMPLE TO BLOOD BANK

## 2016-04-03 LAB — TYPE AND SCREEN
ABO/RH(D): O NEG
Antibody Screen: POSITIVE
DAT, IgG: NEGATIVE

## 2016-04-03 LAB — MRSA PCR SCREENING: MRSA by PCR: NEGATIVE

## 2016-04-03 SURGERY — EGD (ESOPHAGOGASTRODUODENOSCOPY)
Anesthesia: Monitor Anesthesia Care

## 2016-04-03 SURGERY — CANCELLED PROCEDURE

## 2016-04-03 MED ORDER — PROPOFOL 10 MG/ML IV BOLUS
INTRAVENOUS | Status: AC
  Filled 2016-04-03: qty 20

## 2016-04-03 MED ORDER — ACETAMINOPHEN 325 MG PO TABS: 650.0000 mg | ORAL_TABLET | Freq: Four times a day (QID) | ORAL | Status: DC | PRN

## 2016-04-03 MED ORDER — ACETAMINOPHEN 650 MG RE SUPP: 650.0000 mg | Freq: Four times a day (QID) | RECTAL | Status: DC | PRN

## 2016-04-03 MED ORDER — HYDRALAZINE HCL 20 MG/ML IJ SOLN: 20.0000 mg | Freq: Four times a day (QID) | INTRAMUSCULAR | Status: DC | PRN

## 2016-04-03 MED ORDER — FENTANYL CITRATE (PF) 100 MCG/2ML IJ SOLN
INTRAMUSCULAR | Status: AC
  Filled 2016-04-03: qty 2

## 2016-04-03 MED ORDER — SODIUM CHLORIDE 0.9 % IV SOLN
INTRAVENOUS | Status: DC
  Administered 2016-04-03 (×2): via INTRAVENOUS
  Administered 2016-04-04: 1000 mL via INTRAVENOUS
  Administered 2016-04-04: 04:00:00 via INTRAVENOUS
  Administered 2016-04-05 – 2016-04-06 (×2): 1000 mL via INTRAVENOUS

## 2016-04-03 MED ORDER — THIAMINE HCL 100 MG/ML IJ SOLN
100.0000 mg | Freq: Every day | INTRAMUSCULAR | Status: DC
  Administered 2016-04-03 – 2016-04-05 (×3): 100 mg via INTRAVENOUS
  Filled 2016-04-03 (×3): qty 2

## 2016-04-03 MED ORDER — PANTOPRAZOLE SODIUM 40 MG IV SOLR
40.0000 mg | Freq: Once | INTRAVENOUS | Status: AC
  Administered 2016-04-03: 40 mg via INTRAVENOUS
  Filled 2016-04-03: qty 40

## 2016-04-03 MED ORDER — ONDANSETRON HCL 4 MG/2ML IJ SOLN
4.0000 mg | Freq: Once | INTRAMUSCULAR | Status: DC | PRN
Start: 2016-04-03 — End: 2016-04-04

## 2016-04-03 MED ORDER — SODIUM CHLORIDE 0.9% FLUSH
3.0000 mL | Freq: Two times a day (BID) | INTRAVENOUS | Status: DC
  Administered 2016-04-03 – 2016-04-05 (×6): 3 mL via INTRAVENOUS

## 2016-04-03 MED ORDER — MIDAZOLAM HCL 2 MG/2ML IJ SOLN
INTRAMUSCULAR | Status: AC
  Filled 2016-04-03: qty 2

## 2016-04-03 MED ORDER — ONDANSETRON HCL 4 MG/2ML IJ SOLN: 4.0000 mg | Freq: Four times a day (QID) | INTRAMUSCULAR | Status: DC | PRN

## 2016-04-03 MED ORDER — PROPOFOL 10 MG/ML IV BOLUS
INTRAVENOUS | Status: DC | PRN
  Administered 2016-04-03 (×6): 40 mg via INTRAVENOUS
  Administered 2016-04-03: 20 mg via INTRAVENOUS
  Administered 2016-04-03: 40 mg via INTRAVENOUS

## 2016-04-03 MED ORDER — FOLIC ACID 5 MG/ML IJ SOLN
1.0000 mg | Freq: Every day | INTRAMUSCULAR | Status: DC
  Administered 2016-04-03 – 2016-04-05 (×3): 1 mg via INTRAVENOUS
  Filled 2016-04-03 (×7): qty 0.2

## 2016-04-03 MED ORDER — PANTOPRAZOLE SODIUM 40 MG IV SOLR
8.0000 mg/h | INTRAVENOUS | Status: DC
  Administered 2016-04-03 – 2016-04-06 (×4): 8 mg/h via INTRAVENOUS
  Filled 2016-04-03 (×15): qty 80

## 2016-04-03 MED ORDER — LIDOCAINE 2% (20 MG/ML) 5 ML SYRINGE
INTRAMUSCULAR | Status: DC | PRN
  Administered 2016-04-03: 40 mg via INTRAVENOUS

## 2016-04-03 MED ORDER — SODIUM CHLORIDE 0.9% FLUSH
3.0000 mL | Freq: Two times a day (BID) | INTRAVENOUS | Status: DC
  Administered 2016-04-03: 3 mL via INTRAVENOUS

## 2016-04-03 MED ORDER — ONDANSETRON HCL 4 MG PO TABS: 4.0000 mg | ORAL_TABLET | Freq: Four times a day (QID) | ORAL | Status: DC | PRN

## 2016-04-03 MED ORDER — LACTATED RINGERS IV SOLN
INTRAVENOUS | Status: DC | PRN
  Administered 2016-04-03: 11:00:00 via INTRAVENOUS

## 2016-04-03 SURGICAL SUPPLY — 14 items

## 2016-04-03 NOTE — Anesthesia Preprocedure Evaluation (Addendum)
Anesthesia Evaluation  Patient identified by MRN, date of birth, ID band Patient awake    Reviewed: Allergy & Precautions, NPO status , Patient's Chart, lab work & pertinent test results  Airway Mallampati: II  TM Distance: >3 FB Neck ROM: Full    Dental  (+) Poor Dentition, Missing, Dental Advisory Given   Pulmonary pneumonia, resolved, Current Smoker,    Pulmonary exam normal breath sounds clear to auscultation       Cardiovascular Exercise Tolerance: Good hypertension, Normal cardiovascular exam Rhythm:Regular Rate:Normal  HTN on admission- not on any medications   Neuro/Psych PSYCHIATRIC DISORDERS Hx/o personality disorder   GI/Hepatic PUD, GERD  Medicated and Controlled,(+)     substance abuse  alcohol use, cocaine use and marijuana use, Hx/o polysubstance abuse   Endo/Other  negative endocrine ROS  Renal/GU negative Renal ROS  negative genitourinary   Musculoskeletal negative musculoskeletal ROS (+)   Abdominal   Peds  Hematology  (+) anemia ,   Anesthesia Other Findings   Reproductive/Obstetrics                             Chemistry      Component Value Date/Time   NA 142 04/03/2016 0242   K 3.8 04/03/2016 0242   CL 110 04/03/2016 0242   CO2 26 04/03/2016 0242   BUN 14 04/03/2016 0242   CREATININE 1.05 04/03/2016 0242      Component Value Date/Time   CALCIUM 8.5 (L) 04/03/2016 0242   ALKPHOS 64 04/03/2016 0242   AST 17 04/03/2016 0242   ALT 12 (L) 04/03/2016 0242   BILITOT 0.8 04/03/2016 0242     Lab Results  Component Value Date   WBC 15.4 (H) 04/03/2016   HGB 11.7 (L) 04/03/2016   HCT 36.1 (L) 04/03/2016   MCV 76.8 (L) 04/03/2016   PLT 196 04/03/2016  EKG: normal sinus rhythm, prolonged QT interval.-09/2014   Anesthesia Physical Anesthesia Plan  ASA: II  Anesthesia Plan: MAC   Post-op Pain Management:    Induction: Intravenous  Airway Management  Planned: Natural Airway and Nasal Cannula  Additional Equipment:   Intra-op Plan:   Post-operative Plan:   Informed Consent: I have reviewed the patients History and Physical, chart, labs and discussed the procedure including the risks, benefits and alternatives for the proposed anesthesia with the patient or authorized representative who has indicated his/her understanding and acceptance.   Dental advisory given  Plan Discussed with: CRNA, Anesthesiologist and Surgeon  Anesthesia Plan Comments:        Anesthesia Quick Evaluation

## 2016-04-03 NOTE — Progress Notes (Addendum)
Patient ID: [name] Hirota, male   DOB: 10/04/1960, 55 y.o.   MRN: [id number]  PROGRESS NOTE    [name] [name]  WNU:[id number] DOB: 03/19/1961 DOA: 04/02/2016  PCP: Jackie PlumSEI-BONSU,GEORGE, MD   Brief Narrative:  55 y.o. gentleman with a history of active polysubstance abuse, duodenal ulcers with bleeding, recurrent upper GI bleeds requiring blood transfusion (he actually went to the OR in March 2016 for intractable bleeding from a duodenal ulcer), GERD who [facility] for evaluation of coffee ground emesis that started 1 night prior to this admission, associated with epigastric abdominal pain.    CT scan showed multiple findings including thickened esophageal wall, inflammatory changes around the duodenum with a small fluid collection and progressive changes in the RUQ (neoplasm could not be excluded).  He had a WBC count of 17.  He received 1L of NS, IV pepcid, IV protonix (40mg ), GI cocktail and zofran.  Hemoglobin was within normal limits on presentation.  Pt admitted for management of GI bleed. GI has seen in consultation and plan is for EGD today.   Assessment & Plan:   Principal Problem:   Acute upper GI bleed / Hematemesis / Acute blood loss anemia / Leukocytosis - Follow up EGD results - Concerning for duodenal bleed as pt have history of duodenal ulcer bleed - CT scan showed multiple findings including thickened esophageal wall, inflammatory changes around the duodenum with a small fluid collection and progressive changes in the RUQ (neoplasm could not be excluded) - Continue protonix drip for total of 72 hours and then convert to PPI BID - Continue empiric zosyn - Continue to monitor in SDU for next 24 hours     DVT prophylaxis: SCD's due to risk of bleeding  Code Status: full code  Family Communication: no family at the bedside this am Disposition Plan: monitor in SDU for next 24 hours    Consultants:   GI  Procedures:   EGD 04/03/2016  Antimicrobials:   Zosyn      Subjective: Feels better, no overnight events.   Objective: Vitals:   04/03/16 1215 04/03/16 1228 04/03/16 1230 04/03/16 1245  BP: 108/63 101/79 109/62 127/82  Pulse: (!) 58 (!) 56 (!) 59 60  Resp: 19 16 19 18   Temp:   97.8 F (36.6 C) 98 F (36.7 C)  TempSrc:      SpO2: 100% 100% 100% 98%  Weight:      Height:        Intake/Output Summary (Last 24 hours) at 04/03/16 1314 Last data filed at 04/03/16 1203  Gross per 24 hour  Intake          1311.25 ml  Output              300 ml  Net          1011.25 ml   Filed Weights   04/02/16 2114 04/03/16 0200  Weight: 77.1 kg (170 lb) 71.5 kg (157 lb 10.1 oz)    Examination:  General exam: Appears calm and comfortable  Respiratory system: Clear to auscultation. Respiratory effort normal. Cardiovascular system: S1 & S2 heard, RRR. No JVD, murmurs, rubs, gallops or clicks. No pedal edema. Gastrointestinal system: Abdomen is nondistended, soft and nontender. No organomegaly or masses felt. Normal bowel sounds heard. Central nervous system: Alert and oriented. No focal neurological deficits. Extremities: Symmetric 5 x 5 power. Skin: No rashes, lesions or ulcers Psychiatry: Judgement and insight appear normal. Mood & affect appropriate.   Data Reviewed: I have  personally reviewed following labs and imaging studies  CBC:  Recent Labs Lab 04/02/16 1500 04/03/16 0242  WBC 17.5* 15.4*  NEUTROABS 15.9*  --   HGB 13.1 11.7*  HCT 41.2 36.1*  MCV 77.4* 76.8*  PLT 239 196   Basic Metabolic Panel:  Recent Labs Lab 04/02/16 1500 04/03/16 0242  NA 141 142  K 3.5 3.8  CL 102 110  CO2 30 26  GLUCOSE 105* 88  BUN 16 14  CREATININE 1.03 1.05  CALCIUM 9.0 8.5*   GFR: Estimated Creatinine Clearance: 79.5 mL/min (by C-G formula based on SCr of 1.05 mg/dL). Liver Function Tests:  Recent Labs Lab 04/02/16 1500 04/03/16 0242  AST 21 17  ALT 14* 12*  ALKPHOS 82 64  BILITOT 0.7 0.8  PROT 7.8 6.3*  ALBUMIN 4.0 3.2*     Recent Labs Lab 04/02/16 1500  LIPASE 24   No results for input(s): AMMONIA in the last 168 hours. Coagulation Profile:  Recent Labs Lab 04/03/16 0242  INR 1.06   Cardiac Enzymes: No results for input(s): CKTOTAL, CKMB, CKMBINDEX, TROPONINI in the last 168 hours. BNP (last 3 results) No results for input(s): PROBNP in the last 8760 hours. HbA1C: No results for input(s): HGBA1C in the last 72 hours. CBG: No results for input(s): GLUCAP in the last 168 hours. Lipid Profile: No results for input(s): CHOL, HDL, LDLCALC, TRIG, CHOLHDL, LDLDIRECT in the last 72 hours. Thyroid Function Tests: No results for input(s): TSH, T4TOTAL, FREET4, T3FREE, THYROIDAB in the last 72 hours. Anemia Panel: No results for input(s): VITAMINB12, FOLATE, FERRITIN, TIBC, IRON, RETICCTPCT in the last 72 hours. Urine analysis:    Component Value Date/Time   COLORURINE AMBER (A) 10/25/2015 1357   APPEARANCEUR CLEAR 10/25/2015 1357   LABSPEC 1.031 (H) 10/25/2015 1357   PHURINE 6.0 10/25/2015 1357   GLUCOSEU NEGATIVE 10/25/2015 1357   HGBUR NEGATIVE 10/25/2015 1357   BILIRUBINUR SMALL (A) 10/25/2015 1357   KETONESUR 15 (A) 10/25/2015 1357   PROTEINUR 30 (A) 10/25/2015 1357   UROBILINOGEN 0.2 10/06/2014 0019   NITRITE NEGATIVE 10/25/2015 1357   LEUKOCYTESUR NEGATIVE 10/25/2015 1357   Sepsis Labs: @LABRCNTIP (procalcitonin:4,lacticidven:4)    Recent Results (from the past 240 hour(s))  MRSA PCR Screening     Status: None   Collection Time: 04/03/16  1:25 AM  Result Value Ref Range Status   MRSA by PCR NEGATIVE NEGATIVE Final      Radiology Studies: Ct Abdomen Pelvis W Contrast Result Date: 04/02/2016 Persistent stranding inflammatory changes around the duodenum with a small fluid collection either representing duodenum diverticulum or penetrating ulcer. Enlarged enhancing lymph nodes in the area. Stomach is distended. Prominent thick-walled distal esophagus. The right upper quadrant  changes are persisting from previous study and demonstrating progression. This could represent recurrent infection. However, neoplasm should be excluded due to persistence of findings. The distal esophageal wall thickening is significantly increased since previous study and may represent interval development of severe esophagitis or esophageal tumor could be present as well. Consider endoscopy for further evaluation of these changes if clinically indicated.     Scheduled Meds: . folic acid  1 mg Intravenous Daily  . piperacillin-tazobactam (ZOSYN)  IV  3.375 g Intravenous Q8H  . sodium chloride flush  3 mL Intravenous Q12H  . thiamine  100 mg Intravenous Daily   Continuous Infusions: . sodium chloride 125 mL/hr at 04/03/16 0214  . pantoprozole (PROTONIX) infusion 8 mg/hr (04/03/16 1115)     LOS: 1 day  Time spent: 15 minutes  Greater than 50% of the time spent on counseling and coordinating the care.   Manson Passey, MD [facility] Pager [phone]  If 7PM-7AM, please contact night-coverage [email or web address] Password [email or web address] 04/03/2016, 1:14 PM

## 2016-04-03 NOTE — Transfer of Care (Signed)
Immediate Anesthesia Transfer of Care Note  Patient: [name]  Procedure(s) Performed: Procedure(s): ESOPHAGOGASTRODUODENOSCOPY (EGD) (N/A)  Patient Location: PACU  Anesthesia Type:MAC  Level of Consciousness: sedated  Airway & Oxygen Therapy: Patient Spontanous Breathing and Patient connected to face mask oxygen  Post-op Assessment: Report given to RN and Post -op Vital signs reviewed and stable  Post vital signs: Reviewed and stable  Last Vitals:  Vitals:   04/03/16 1100 04/03/16 1200  BP: (!) 160/84 112/61  Pulse: 91   Resp: (!) 26   Temp:  36.7 C    Last Pain:  Vitals:   04/03/16 0800  TempSrc: Oral  PainSc: 0-No pain         Complications: No apparent anesthesia complications

## 2016-04-03 NOTE — Anesthesia Postprocedure Evaluation (Signed)
Anesthesia Post Note  Patient: [name]  Procedure(s) Performed: Procedure(s) (LRB): ESOPHAGOGASTRODUODENOSCOPY (EGD) (N/A)  Patient location during evaluation: PACU Anesthesia Type: MAC Level of consciousness: awake and alert and oriented Pain management: pain level controlled Vital Signs Assessment: post-procedure vital signs reviewed and stable Respiratory status: spontaneous breathing, nonlabored ventilation and respiratory function stable Cardiovascular status: stable and blood pressure returned to baseline Postop Assessment: no signs of nausea or vomiting Anesthetic complications: no    Last Vitals:  Vitals:   04/03/16 1230 04/03/16 1245  BP: 109/62 127/82  Pulse: (!) 59 60  Resp: 19 18  Temp: 36.6 C 36.7 C    Last Pain:  Vitals:   04/03/16 0800  TempSrc: Oral  PainSc: 0-No pain                 Mariam Helbert A.

## 2016-04-03 NOTE — Op Note (Signed)
[facility] Patient Name: Ryan GradRandy Nichols Procedure Date: 04/03/2016 MRN: [id number] Attending MD: Napoleon FormKavitha V. Shikha Bibb , MD Date of Birth: 08/07/1960 CSN: [id number] Age: [age] Admit Type: Inpatient Procedure:                Upper GI endoscopy Indications:              Active gastrointestinal bleeding, Suspected upper                            gastrointestinal bleeding, Endoscopy to confirm                            suspected neoplastic lesion of the esophagus seen                            on previous imaging study, Endoscopy to confirm                            suspected neoplastic lesion of the duodenum seen on                            previous imaging study Providers:                Napoleon FormKavitha V. Marely Apgar, MD, Sarah Monday, RN, Lorenda IshiharaSam                            Tetteh, Technician Referring MD:              Medicines:                Monitored Anesthesia Care Complications:            No immediate complications. Estimated Blood Loss:     Estimated blood loss was minimal. Procedure:                Pre-Anesthesia Assessment:                           - Prior to the procedure, a History and Physical                            was performed, and patient medications and                            allergies were reviewed. The patient's tolerance of                            previous anesthesia was also reviewed. The risks                            and benefits of the procedure and the sedation                            options and risks were discussed with the patient.                            All  questions were answered, and informed consent                            was obtained. Prior Anticoagulants: The patient has                            taken no previous anticoagulant or antiplatelet                            agents. ASA Grade Assessment: III - A patient with                            severe systemic disease. After reviewing the risks   and benefits, the patient was deemed in                            satisfactory condition to undergo the procedure.                           After obtaining informed consent, the endoscope was                            passed under direct vision. Throughout the                            procedure, the patient's blood pressure, pulse, and                            oxygen saturations were monitored continuously. The                            EG-[id number] ([id number]) scope was introduced through the                            mouth, and advanced to the second part of duodenum.                            The upper GI endoscopy was accomplished without                            difficulty. The patient tolerated the procedure                            well. Scope In: Scope Out: Findings:      LA Grade D severe (one or more mucosal breaks involving at least 75% of       esophageal circumference) esophagitis with ulcerated mucosa in the       distal esophagus with no active bleeding was found 30 to 40 cm from the       incisors.      A large amount of food (residue) was found in the gastric fundus.      Few non-bleeding cratered gastric ulcers with pigmented material were       found in the prepyloric region of the stomach and at the pylorus with  visible suture material. The largest lesion was 5 mm in largest       dimension. Biopsies were taken with a cold forceps from antrum and body       for Helicobacter pylori testing.      Few clean based cratered duodenal ulcers with pigmented material were       found in the duodenal bulb covering about 70-80% of the circumference.       The largest lesion was 15 mm in largest dimension. No vissible vessel.       Surrounding erythematous mucosa was oozing on touch. No endoscopic       therapy was done. Impression:               - LA Grade D severe erosive esophagitis and                            ulcerated distal esophagus.                            - Non-bleeding gastric ulcers with pigmented                            material and visible sutures. Biopsied.                           - Partial gastric outlet obstruction with extensive                            peptic ulcers                           - Multiple duodenal ulcers with pigmented material                            and surrounding erythematous mucosa oozing on touch. Moderate Sedation:      N/A- Per Anesthesia Care Recommendation:           - Clear liquid diet today and can advance to full                            liquid tomorrow if Hgb stable                           - Continue present medications. PPI gtt for 72                            hours and then switch to PPI BID                           - Await pathology results for H.pylori and treat if                            positive.                           - No aspirin, ibuprofen, naproxen, or other  non-steroidal anti-inflammatory drugs.                           -Repeat EGD in 2-3 months for surveillance and                            document ulcer healing Procedure Code(s):        --- Professional ---                           [phone], Esophagogastroduodenoscopy, flexible,                            transoral; with biopsy, single or multiple Diagnosis Code(s):        --- Professional ---                           K20.8, Other esophagitis                           K25.9, Gastric ulcer, unspecified as acute or                            chronic, without hemorrhage or perforation                           K26.4, Chronic or unspecified duodenal ulcer with                            hemorrhage                           K92.2, Gastrointestinal hemorrhage, unspecified                           R93.3, Abnormal findings on diagnostic imaging of                            other parts of digestive tract CPT copyright 2016 [employer]. All rights reserved. The codes  documented in this report are preliminary and upon coder review may  be revised to meet current compliance requirements. [name], MD 04/03/2016 12:20:11 PM This report has been signed electronically. Number of Addenda: 0

## 2016-04-03 NOTE — Consult Note (Signed)
Consultation  Referring Provider:   Dr. Elisabeth Pigeon   Primary Care Physician:  [name], MD Primary Gastroenterologist: Dr. Rhea Belton     Reason for Consultation:  Hematemesis, Epigastric pain            HPI:   [name] is a 55 y.o. African-American male with a past medical history significant for polysubstance abuse, duodenal ulcers with bleeding and recurrent upper GI bleeds requiring blood transfusion as well as GERD, who presented to the ER on 04/02/16 with a chief complaint of coffee-ground emesis and epigastric pain.  It should be noted the patient is a poor historian. At time of interview today he explains that he started "out of the blue" vomiting what looked like coffee grounds around 1:00 yesterday afternoon, 04/02/16, this continued until 4 5 PM. He did call the ambulance when this started as it came on with sweats and a severe amount of epigastric pain which he rates as a 10 out of 10, this was spreading around to his right side. He tells me that before this time he was feeling "perfectly fine". He does admit to drinking 140 ounces of beer since Friday, 2 days ago. He denies any other substances at this time. He explains that he was scared after he saw the coffee-ground emesis as he is "been through this before", and was in the hospital and required blood as well as surgery.  Currently the patient tells me that he no longer has any abdominal pain and has not vomited since his arrival to the ER yesterday. He has not had a bowel movement for the past week, though he describes his last bowel movement has "normal and brown".  Patient denies fever, chills, anorexia, dysphagia, heartburn, reflux, change in bowel habits, use of NSAIDs or blood thinners.  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 Selective injection showed gastric arterial supply to much of the distal stomach and body of the stomach along the lesser curve. No cervical duodenal supply identified. On 10/10/14 he underwent exploratory laparotomy with gastrorrhaphy and oversewn duodenal ulcer, a JP drain was left in place, postoperatively he was placed on TPN and remained hemodynamically stable. His diet was slowly advanced and patient was discharged home on 10/20/14. 10/06/14: EGD, Dr. Rhea Belton: Impression: The mucus of the esophagus and stomach appeared normal, large ulcer with visible vessel was found in the duodenal bulb 10/09/14: EGD, Dr. Rhea Belton: Impression: Mucus of the esophagus appeared normal, blood in the stomach, large ulcer again seen in the duodenal bulb with large visible vessel, Hemoclip placed at base Previous GI bleed due to duodenal ulcer in 2014  Past Medical History:  Diagnosis Date  . Abdominal pain, other specified site   . Abnormal CT scan, esophagus 03/2012  . GERD (gastroesophageal reflux disease)   . Substance abuse 03/2012   tox screen positive for cocaine.     Past Surgical History:  Procedure Laterality Date  . ESOPHAGOGASTRODUODENOSCOPY  08/10/2012   Procedure: ESOPHAGOGASTRODUODENOSCOPY (EGD);  Surgeon: [name], MD;  Location[facility] ENDOSCOPY;  Service: Endoscopy;  Laterality: N/A;  will be done in room 1   . ESOPHAGOGASTRODUODENOSCOPY Left 05/01/2013   Procedure: ESOPHAGOGASTRODUODENOSCOPY (EGD);  Surgeon: [name], MD;  Location: [facility]  ENDOSCOPY;  Service: Endoscopy;  Laterality: Left;  . ESOPHAGOGASTRODUODENOSCOPY N/A 05/11/2013   Procedure: ESOPHAGOGASTRODUODENOSCOPY (EGD);  Surgeon: [name], MD;  Location: [facility] ENDOSCOPY;  Service: Endoscopy;  Laterality: N/A;  . ESOPHAGOGASTRODUODENOSCOPY N/A 10/06/2014   Procedure: ESOPHAGOGASTRODUODENOSCOPY (EGD);  Surgeon: [name], MD;  Location: [facility] ENDOSCOPY;  Service: Endoscopy;  Laterality: N/A;  . ESOPHAGOGASTRODUODENOSCOPY N/A 10/09/2014   Procedure: ESOPHAGOGASTRODUODENOSCOPY (EGD);  Surgeon: [name], MD;  Location: [facility] ENDOSCOPY;  Service: Gastroenterology;  Laterality: N/A;  Bedside  . LAPAROTOMY  08/10/2012   Procedure: EXPLORATORY LAPAROTOMY;  Surgeon: [name], MD;  Location: [facility] OR;  Service: General;  Laterality: N/A;  . LAPAROTOMY N/A 10/10/2014   Procedure: EXPLORATORY LAPAROTOMY WITH GASTROPATHY AND OVER SEWEN DUODENAL ULCER REGION ;  Surgeon: [name], MD;  Location: [facility] ORS;  Service: General;  Laterality: N/A;  . undescended testicle     on right. noted on 03/2012 CT scan.  no surgery referral.     Family history Patient describes a history of "cancers" in his parents. His father apparently had "gallbladder cancer" and his mother died of "lung cancer", he does not know any details.  Social History  Substance Use Topics  . Smoking status: Light Tobacco Smoker    Packs/day: 1.00    Years: 34.00  . Smokeless tobacco: Never Used  . Alcohol use Yes     Comment: 40oz beer/day    Prior to Admission medications   Not on File    Current Facility-Administered Medications  Medication Dose Route Frequency Provider Last Rate Last Dose  . 0.9 %  sodium chloride infusion   Intravenous Continuous Michael Litter, MD 125 mL/hr at 04/03/16 0214    . acetaminophen (TYLENOL) tablet 650 mg  650 mg Oral Q6H PRN Michael Litter, MD       Or  . acetaminophen (TYLENOL) suppository 650 mg  650 mg Rectal Q6H PRN Michael Litter, MD      . folic acid injection 1 mg  1 mg Intravenous Daily Michael Litter, MD      . hydrALAZINE (APRESOLINE) injection 20 mg  20 mg Intravenous Q6H PRN Michael Litter, MD      . ondansetron [facility]) tablet 4 mg  4 mg Oral Q6H PRN Michael Litter, MD       Or  . ondansetron [facility]) injection 4 mg  4 mg Intravenous Q6H PRN Michael Litter, MD      . pantoprazole (PROTONIX) 80 mg in sodium  chloride 0.9 % 250 mL (0.32 mg/mL) infusion  8 mg/hr Intravenous Continuous Michael Litter, MD 25 mL/hr at 04/03/16 0223 8 mg/hr at 04/03/16 0223  . piperacillin-tazobactam (ZOSYN) IVPB 3.375 g  3.375 g Intravenous Q8H Tilden Fossa, MD   3.375 g at 04/03/16 0302  . sodium chloride flush (NS) 0.9 % injection 3 mL  3 mL Intravenous Q12H Michael Litter, MD   3 mL at 04/03/16 0214  . thiamine (B-1) injection 100 mg  100 mg Intravenous Daily Michael Litter, MD        Allergies as of 04/02/2016 - Review Complete 04/02/2016  Allergen Reaction Noted  . Nsaids Other (See Comments) 10/20/2014  . Chocolate Nausea And Vomiting 04/08/2013  . Peanut-containing drug products Nausea And Vomiting 03/16/2013  . Spinach Nausea And Vomiting 03/16/2013     Review of Systems:    Constitutional: Positive for sweats prior to episode of hematemesis No weight loss, fever, chills, weakness or fatigue HEENT: Eyes: No change in vision  Ears, Nose, Throat:  No change in hearing or congestion Skin: No rash or itching Cardiovascular: No chest pain, chest pressure or palpitations Respiratory: No SOB or cough Gastrointestinal: See HPI and otherwise negative Genitourinary: No dysuria or change in urinary frequency Neurological: No headache, dizziness or syncope Musculoskeletal: No new muscle or joint pain Hematologic: See history of present illness No bruising Psychiatric: No history of depression or anxiety   Physical Exam:  Vital signs in last 24 hours: Temp:  [98 F (36.7 C)-99.4 F (37.4 C)] 98.3 F (36.8 C) (09/03 0800) Pulse Rate:  [59-86] 64 (09/03 0600) Resp:  [15-117] 19 (09/03 0600) BP: (131-167)/(69-106) 149/88 (09/03 0600) SpO2:  [94 %-99 %] 94 % (09/03 0600) Weight:  [157 lb 10.1 oz (71.5 kg)-170 lb (77.1 kg)] 157 lb 10.1 oz (71.5 kg) (09/03 0200) Last BM Date: 04/02/16 General:   African-American male appears to be in NAD, Well developed, Well nourished, alert and cooperative Head:   Normocephalic and atraumatic. Eyes:   PEERL, EOMI. No icterus. Conjunctiva pink. Ears:  Normal auditory acuity. Neck:  Supple Throat: Oral cavity and pharynx without inflammation, swelling or lesion. Poor dentition Lungs: Respirations even and unlabored. Lungs clear to auscultation bilaterally.   No wheezes, crackles, or rhonchi.  Heart: Normal S1, S2. No MRG. Regular rate and rhythm. No peripheral edema, cyanosis or pallor.  Abdomen:  Soft, nondistended, nontender. No rebound or guarding. Normal bowel sounds. No appreciable masses or hepatomegaly. Rectal:  Not performed.  Msk:  Symmetrical without gross deformities. Peripheral pulses intact.  Extremities:  Without edema, no deformity or joint abnormality.  Neurologic:  Alert and  oriented x4;  grossly normal neurologically. Skin:   Dry and intact without significant lesions or rashes. Psychiatric: Oriented to person, place and time. Demonstrates good judgement and reason without abnormal affect or behaviors.   LAB RESULTS:  Recent Labs  04/02/16 1500 04/03/16 0242  WBC 17.5* 15.4*  HGB 13.1 11.7*  HCT 41.2 36.1*  PLT 239 196   BMET  Recent Labs  04/02/16 1500 04/03/16 0242  NA 141 142  K 3.5 3.8  CL 102 110  CO2 30 26  GLUCOSE 105* 88  BUN 16 14  CREATININE 1.03 1.05  CALCIUM 9.0 8.5*   LFT  Recent Labs  04/03/16 0242  PROT 6.3*  ALBUMIN 3.2*  AST 17  ALT 12*  ALKPHOS 64  BILITOT 0.8   PT/INR  Recent Labs  04/03/16 0242  LABPROT 13.8  INR 1.06    STUDIES: Ct Abdomen Pelvis W Contrast  Result Date: 04/02/2016 CLINICAL DATA:  Coffee-ground emesis since last night. Mid abdominal pain beginning last night. Alcohol use. EXAM: CT ABDOMEN AND PELVIS WITH CONTRAST TECHNIQUE: Multidetector CT imaging of the abdomen and pelvis was performed using the standard protocol following bolus administration of intravenous contrast. CONTRAST:  ISOVUE-300 IOPAMIDOL (ISOVUE-300) INJECTION 61% COMPARISON:  06/19/2015  FINDINGS: Lung bases are clear. The distal esophagus demonstrates diffuse wall thickening and edema. This may reflect changes of esophagitis although esophageal neoplasm is not excluded. Focal lymphadenopathy adjacent to the esophagus. Consider endoscopy for additional evaluation. The stomach is distended without discrete wall thickening. Small bowel are not abnormally distended. Mild wall thickening demonstrated in the duodenum suggesting duodenitis. Changes may reflect peptic ulcer disease. Gas and fluid collection adjacent to the duodenum ball could represent a diverticulum or a penetrating ulcer. Again demonstrated is inflammatory stranding adjacent to the duodenum with enlarged regional enhancing lymph nodes. While this may represent recurrent inflammatory  process, neoplasm should be excluded. The gallbladder, bile ducts, and pancreatic duct are unremarkable. Liver, spleen, adrenal glands, kidneys, abdominal aorta, inferior vena cava, and retroperitoneal lymph nodes are unremarkable. Stool and gas filled colon without distention or wall thickening. No free air or free fluid demonstrated in the abdomen. Pelvis: The appendix is normal. Bladder wall is not thickened. Prostate gland is not enlarged. No free or loculated pelvic fluid collections. No pelvic mass or lymphadenopathy. No destructive bone lesions. IMPRESSION: Persistent stranding inflammatory changes around the duodenum with a small fluid collection either representing duodenum diverticulum or penetrating ulcer. Enlarged enhancing lymph nodes in the area. Stomach is distended. Prominent thick-walled distal esophagus. The right upper quadrant changes are persisting from previous study and demonstrating progression. This could represent recurrent infection. However, neoplasm should be excluded due to persistence of findings. The distal esophageal wall thickening is significantly increased since previous study and may represent interval development of  severe esophagitis or esophageal tumor could be present as well. Consider endoscopy for further evaluation of these changes if clinically indicated. Electronically Signed   By: Burman NievesWilliam  Stevens M.D.   On: 04/02/2016 18:17     PREVIOUS ENDOSCOPIES:            See HPI   Impression / Plan:   Impression: 1. Coffee-ground emesis: Patient reports this started on 04/02/16 around 1 PM and lasted for 4 hours, "came on out of the blue", previous history of the same with a duodenal ulcer bleed in 2016 as well as 2014. In March of last year this required oversewing by surgical team. Likely this represents the same. 2. Epigastric pain: Patient reports severe abdominal pain during time of hematemesis yesterday, none at time of exam today 3. History of duodenal ulcer: See history of present illness, bleeds in 2014 and 2016 4. Alcohol abuse: Patient does not admit to overuse of alcohol at this time, though has a history of this in the past and this is likely contributing to continued symptoms as well as medication noncompliance  Plan: 1. Discussed with Dr. Lavon PaganiniNandigam. Will schedule EGD, timing depending. Discussed risks, benefits, limitations and alternatives of the patient and he agrees to proceed. 2. Patient to remain nothing by mouth until further recommendations after time of procedure 3. Continue supportive measures 4. Continue pantoprazole infusion 5. Monitor hemoglobin with transfusion as needed 6. Discussed above with Dr. Lavon PaganiniNandigam, please await any further recommendations  Thank you for your kind consultation, we will continue to follow.  Violet BaldyJennifer Lynne Lemmon  04/03/2016, 9:04 AM Pager #: [phone]

## 2016-04-04 DIAGNOSIS — F1011 Alcohol abuse, in remission: Secondary | ICD-10-CM

## 2016-04-04 LAB — COMPREHENSIVE METABOLIC PANEL
ALT: 10 U/L — AB (ref 17–63)
AST: 17 U/L (ref 15–41)
Albumin: 2.6 g/dL — ABNORMAL LOW (ref 3.5–5.0)
Alkaline Phosphatase: 51 U/L (ref 38–126)
Anion gap: 4 — ABNORMAL LOW (ref 5–15)
BILIRUBIN TOTAL: 0.4 mg/dL (ref 0.3–1.2)
BUN: 8 mg/dL (ref 6–20)
CHLORIDE: 108 mmol/L (ref 101–111)
CO2: 24 mmol/L (ref 22–32)
CREATININE: 1.25 mg/dL — AB (ref 0.61–1.24)
Calcium: 8 mg/dL — ABNORMAL LOW (ref 8.9–10.3)
GFR calc Af Amer: >60 mL/min (ref >60)
Glucose, Bld: 140 mg/dL — ABNORMAL HIGH (ref 65–99)
Potassium: 3.4 mmol/L — ABNORMAL LOW (ref 3.5–5.1)
Sodium: 136 mmol/L (ref 135–145)
Total Protein: 5.2 g/dL — ABNORMAL LOW (ref 6.5–8.1)

## 2016-04-04 LAB — CBC WITH DIFFERENTIAL/PLATELET
Basophils Absolute: 0 10*3/uL (ref 0.0–0.1)
Basophils Relative: 1 %
Eosinophils Absolute: 0.1 10*3/uL (ref 0.0–0.7)
Eosinophils Relative: 2 %
HEMATOCRIT: 33 % — AB (ref 39.0–52.0)
HEMOGLOBIN: 10.7 g/dL — AB (ref 13.0–17.0)
LYMPHS ABS: 1.7 10*3/uL (ref 0.7–4.0)
LYMPHS PCT: 25 %
MCH: 25.3 pg — AB (ref 26.0–34.0)
MCHC: 32.4 g/dL (ref 30.0–36.0)
MCV: 78 fL (ref 78.0–100.0)
MONO ABS: 0.6 10*3/uL (ref 0.1–1.0)
MONOS PCT: 9 %
NEUTROS ABS: 4.2 10*3/uL (ref 1.7–7.7)
NEUTROS PCT: 63 %
Platelets: 177 10*3/uL (ref 150–400)
RBC: 4.23 MIL/uL (ref 4.22–5.81)
RDW: 14.7 % (ref 11.5–15.5)
WBC: 6.6 10*3/uL (ref 4.0–10.5)

## 2016-04-04 MED ORDER — POTASSIUM CHLORIDE CRYS ER 20 MEQ PO TBCR
40.0000 meq | EXTENDED_RELEASE_TABLET | Freq: Once | ORAL | Status: AC
  Administered 2016-04-04: 40 meq via ORAL
  Filled 2016-04-04: qty 2

## 2016-04-04 NOTE — Progress Notes (Signed)
Progress Note   Subjective  Chief Complaint: Hematemesis, Epigastric Pain  Pt found laying comfortably in bed, per nursing he was found eating some chips from his back pack this morning, he has tolerated a clear liquid diet otherwise, tells me he is hungry. Denies abdominal pain, further episodes of hematemesis or having a bowel movement.    Objective   Vital signs in last 24 hours: Temp:  [97.6 F (36.4 C)-99.3 F (37.4 C)] 98 F (36.7 C) (09/04 0800) Pulse Rate:  [49-91] 67 (09/04 0806) Resp:  [0-26] 22 (09/04 0806) BP: (101-162)/(55-95) 147/91 (09/04 0806) SpO2:  [94 %-100 %] 98 % (09/04 0806) Last BM Date: 04/02/16 General: African American male in NAD Heart:  Regular rate and rhythm; no murmurs Lungs: Respirations even and unlabored, lungs CTA bilaterally Abdomen:  Soft, nontender and nondistended. Normal bowel sounds. Extremities:  Without edema. Neurologic:  Alert and oriented,  grossly normal neurologically. Psych:  Cooperative. Normal mood and affect.  Intake/Output from previous day: 09/03 0701 - 09/04 0700 In: 3720 [P.O.:960; I.V.:2660; IV Piggyback:100] Out: 3565 [Urine:3565] Intake/Output this shift: Total I/O In: 150 [I.V.:150] Out: 400 [Urine:400]  Lab Results:  Recent Labs  04/02/16 1500 04/03/16 0242  WBC 17.5* 15.4*  HGB 13.1 11.7*  HCT 41.2 36.1*  PLT 239 196   BMET  Recent Labs  04/02/16 1500 04/03/16 0242  NA 141 142  K 3.5 3.8  CL 102 110  CO2 30 26  GLUCOSE 105* 88  BUN 16 14  CREATININE 1.03 1.05  CALCIUM 9.0 8.5*   LFT  Recent Labs  04/03/16 0242  PROT 6.3*  ALBUMIN 3.2*  AST 17  ALT 12*  ALKPHOS 64  BILITOT 0.8   PT/INR  Recent Labs  04/03/16 0242  LABPROT 13.8  INR 1.06    Studies/Results: Ct Abdomen Pelvis W Contrast  Result Date: 04/02/2016 CLINICAL DATA:  Coffee-ground emesis since last night. Mid abdominal pain beginning last night. Alcohol use. EXAM: CT ABDOMEN AND PELVIS WITH CONTRAST  TECHNIQUE: Multidetector CT imaging of the abdomen and pelvis was performed using the standard protocol following bolus administration of intravenous contrast. CONTRAST:  ISOVUE-300 IOPAMIDOL (ISOVUE-300) INJECTION 61% COMPARISON:  06/19/2015 FINDINGS: Lung bases are clear. The distal esophagus demonstrates diffuse wall thickening and edema. This may reflect changes of esophagitis although esophageal neoplasm is not excluded. Focal lymphadenopathy adjacent to the esophagus. Consider endoscopy for additional evaluation. The stomach is distended without discrete wall thickening. Small bowel are not abnormally distended. Mild wall thickening demonstrated in the duodenum suggesting duodenitis. Changes may reflect peptic ulcer disease. Gas and fluid collection adjacent to the duodenum ball could represent a diverticulum or a penetrating ulcer. Again demonstrated is inflammatory stranding adjacent to the duodenum with enlarged regional enhancing lymph nodes. While this may represent recurrent inflammatory process, neoplasm should be excluded. The gallbladder, bile ducts, and pancreatic duct are unremarkable. Liver, spleen, adrenal glands, kidneys, abdominal aorta, inferior vena cava, and retroperitoneal lymph nodes are unremarkable. Stool and gas filled colon without distention or wall thickening. No free air or free fluid demonstrated in the abdomen. Pelvis: The appendix is normal. Bladder wall is not thickened. Prostate gland is not enlarged. No free or loculated pelvic fluid collections. No pelvic mass or lymphadenopathy. No destructive bone lesions. IMPRESSION: Persistent stranding inflammatory changes around the duodenum with a small fluid collection either representing duodenum diverticulum or penetrating ulcer. Enlarged enhancing lymph nodes in the area. Stomach is distended. Prominent thick-walled distal esophagus. The right  upper quadrant changes are persisting from previous study and demonstrating  progression. This could represent recurrent infection. However, neoplasm should be excluded due to persistence of findings. The distal esophageal wall thickening is significantly increased since previous study and may represent interval development of severe esophagitis or esophageal tumor could be present as well. Consider endoscopy for further evaluation of these changes if clinically indicated. Electronically Signed   By: Burman Nieves M.D.   On: 04/02/2016 18:17   EGD 04/03/16, Dr. Lavon Paganini: Findings:      LA Grade D severe (one or more mucosal breaks involving at least 75% of       esophageal circumference) esophagitis with ulcerated mucosa in the       distal esophagus with no active bleeding was found 30 to 40 cm from the       incisors.      A large amount of food (residue) was found in the gastric fundus.      Few non-bleeding cratered gastric ulcers with pigmented material were       found in the prepyloric region of the stomach and at the pylorus with       visible suture material. The largest lesion was 5 mm in largest       dimension. Biopsies were taken with a cold forceps from antrum and body       for Helicobacter pylori testing.      Few clean based cratered duodenal ulcers with pigmented material were       found in the duodenal bulb covering about 70-80% of the circumference.       The largest lesion was 15 mm in largest dimension. No vissible vessel.       Surrounding erythematous mucosa was oozing on touch. No endoscopic       therapy was done. Impression:               - LA Grade D severe erosive esophagitis and                            ulcerated distal esophagus.                           - Non-bleeding gastric ulcers with pigmented                            material and visible sutures. Biopsied.                           - Partial gastric outlet obstruction with extensive                            peptic ulcers                           - Multiple duodenal  ulcers with pigmented material                            and surrounding erythematous mucosa oozing on touch. Moderate Sedation:      N/A- Per Anesthesia Care Recommendation:           - Clear liquid diet today and can advance to full  liquid tomorrow if Hgb stable                           - Continue present medications. PPI gtt for 72                            hours and then switch to PPI BID                           - Await pathology results for H.pylori and treat if                            positive.                           - No aspirin, ibuprofen, naproxen, or other                            non-steroidal anti-inflammatory drugs.                           -Repeat EGD in 2-3 months for surveillance and                            document ulcer healing    Assessment / Plan:   Assessment: 1. Coffee-ground emesis: Patient reports this started on 04/02/16 around 1 PM and lasted for 4 hours, "came on out of the blue", previous history of the same with a duodenal ulcer bleed in 2016 as well as 2014. In March of last year this required oversewing by surgical team. EGD 04/03/16 with findings as above-non-bleeding gastric ulcers, LAGrade D esophagitis and ulcerated distal esophagus, multiple duodenal ulcers and partial gastric outlet obstruction 2. Epigastric pain: Controlled since time of admission 3. History of duodenal ulcer: See history of present illness, bleeds in 2014 and 2016 4. Alcohol abuse: Patient does not admit to overuse of alcohol at this time, though has a history of this in the past and this is likely contributing to continued symptoms as well as medication noncompliance  Plan: 1. Increased patient to full liquid diet 2. Continue to monitor hgb with transfusion as needed, alert our team if further signs of acute bleed 3. Continue PPI gtt for total of 72 hours then switch to PPI BID 4. Pt will need repeat EGD in 2-3 mos 5. Continue other  supportive measures 6. Discussed above with Dr. Lavon PaganiniNandigam, please await any further recs  Thank you for your kind consultation, we will continue to follow.   LOS: 2 days   Unk LightningJennifer Lynne Latunya Kissick  04/04/2016, 9:05 AM  Pager # [phone]

## 2016-04-04 NOTE — Progress Notes (Signed)
Patient was observed eating chips he brought from home in his backpack. Pt.was educated on his diet and he verbalizes understanding. Backpack placed in pt.'s closet.

## 2016-04-04 NOTE — Progress Notes (Signed)
Pt.is sinus brady at rest. Lowest HR was 44 non-sustained. Pt.HR sustains between 47 to mid 50's. He is asymptomatic. Denies feeling lightheaded, dizzy, or having shortness of breath. On call provider paged and notified.

## 2016-04-04 NOTE — Progress Notes (Addendum)
Patient ID: [name] Frazier, male   DOB: 10/25/1960, 55 y.o.   MRN: [id number]  PROGRESS NOTE    [name]  [id number] DOB: 04/09/1961 DOA: 04/02/2016  PCP: Jackie PlumSEI-BONSU,GEORGE, MD   Brief Narrative:  55 y.o. gentleman with a history of active polysubstance abuse, duodenal ulcers with bleeding, recurrent upper GI bleeds requiring blood transfusion (he actually went to the OR in March 2016 for intractable bleeding from a duodenal ulcer), GERD who [facility] for evaluation of coffee ground emesis that started 1 night prior to this admission, associated with epigastric abdominal pain.    CT scan showed multiple findings including thickened esophageal wall, inflammatory changes around the duodenum with a small fluid collection and progressive changes in the RUQ (neoplasm could not be excluded).  He had a WBC count of 17.  He received 1L of NS, IV pepcid, IV protonix (40mg ), GI cocktail and zofran.  Hemoglobin was within normal limits on presentation.  Pt admitted for management of GI bleed. GI has seen in consultation and plan is for EGD today.   Assessment & Plan:   Principal Problem:   Acute upper GI bleed / Hematemesis / Acute blood loss anemia / Leukocytosis - CT scan showed multiple findings including thickened esophageal wall, inflammatory changes around the duodenum with a small fluid collection and progressive changes in the RUQ (neoplasm could not be excluded) - EGD showed severe erosive esophagitis and ulcerated distal esophagus, non bleeding gastric ulcers wit pigmented material and visible sutures, biopsied, partial gastric outlet obstruction with extensive peptic ulcers and multiple duodenal ulcers with pigmented material and surround erythematous mucosa oozing on touch. - Continue protonix drip per GI (total of 72 hours) then switch to PPI BID - Stop abx  - Transfer to medical floor today     History of alcohol abuse - Continue thiamine, folic acid and multivitamin - No  withdrawals     Hypokalemia - Due to GI losses - Supplemented - Check magnesium, BMP in am   DVT prophylaxis: SCD's bilaterally  Code Status: full code  Family Communication: no family at the bedside this am Disposition Plan: transfer to medical floor    Consultants:   GI  Procedures:   EGD 04/03/2016 - severe erosive esophagitis and ulcerated distal esophagus, non bleeding gastric ulcers wit pigmented material and visible sutures, biopsied, partial gastric outlet obstruction with extensive peptic ulcers and multiple duodenal ulcers with pigmented material and surround erythematous mucosa oozing on touch.  Antimicrobials:   Zosyn stopped 9/4   Subjective: No overnight events.   Objective: Vitals:   04/04/16 0806 04/04/16 0900 04/04/16 1000 04/04/16 1100  BP: (!) 147/91 (!) 152/83  (!) 130/101  Pulse: 67 60 (!) 58 (!) 58  Resp: (!) 22 (!) 21 19 20   Temp:      TempSrc:      SpO2: 98% 98% 97% 97%  Weight:      Height:        Intake/Output Summary (Last 24 hours) at 04/04/16 1147 Last data filed at 04/04/16 1100  Gross per 24 hour  Intake             4320 ml  Output             4365 ml  Net              -45 ml   Filed Weights   04/02/16 2114 04/03/16 0200  Weight: 77.1 kg (170 lb) 71.5 kg (157 lb 10.1 oz)  Examination:  General exam: Appears calm and comfortable, no distress  Respiratory system: No wheezing or rhonchi  Cardiovascular system: S1 & S2 heard, Rate controlled  Gastrointestinal system: (+) BS, non tender  Central nervous system: No focal neurological deficits. Extremities: No edema, palpable pulses  Skin: warm and dry Psychiatry: Mood & affect appropriate.   Data Reviewed: I have personally reviewed following labs and imaging studies  CBC:  Recent Labs Lab 04/02/16 1500 04/03/16 0242 04/04/16 0856  WBC 17.5* 15.4* 6.6  NEUTROABS 15.9*  --  4.2  HGB 13.1 11.7* 10.7*  HCT 41.2 36.1* 33.0*  MCV 77.4* 76.8* 78.0  PLT 239 196 177    Basic Metabolic Panel:  Recent Labs Lab 04/02/16 1500 04/03/16 0242 04/04/16 0856  NA 141 142 136  K 3.5 3.8 3.4*  CL 102 110 108  CO2 30 26 24   GLUCOSE 105* 88 140*  BUN 16 14 8   CREATININE 1.03 1.05 1.25*  CALCIUM 9.0 8.5* 8.0*   GFR: Estimated Creatinine Clearance: 66.8 mL/min (by C-G formula based on SCr of 1.25 mg/dL). Liver Function Tests:  Recent Labs Lab 04/02/16 1500 04/03/16 0242 04/04/16 0856  AST 21 17 17   ALT 14* 12* 10*  ALKPHOS 82 64 51  BILITOT 0.7 0.8 0.4  PROT 7.8 6.3* 5.2*  ALBUMIN 4.0 3.2* 2.6*    Recent Labs Lab 04/02/16 1500  LIPASE 24   No results for input(s): AMMONIA in the last 168 hours. Coagulation Profile:  Recent Labs Lab 04/03/16 0242  INR 1.06   Cardiac Enzymes: No results for input(s): CKTOTAL, CKMB, CKMBINDEX, TROPONINI in the last 168 hours. BNP (last 3 results) No results for input(s): PROBNP in the last 8760 hours. HbA1C: No results for input(s): HGBA1C in the last 72 hours. CBG: No results for input(s): GLUCAP in the last 168 hours. Lipid Profile: No results for input(s): CHOL, HDL, LDLCALC, TRIG, CHOLHDL, LDLDIRECT in the last 72 hours. Thyroid Function Tests: No results for input(s): TSH, T4TOTAL, FREET4, T3FREE, THYROIDAB in the last 72 hours. Anemia Panel: No results for input(s): VITAMINB12, FOLATE, FERRITIN, TIBC, IRON, RETICCTPCT in the last 72 hours. Urine analysis:    Component Value Date/Time   COLORURINE AMBER (A) 10/25/2015 1357   APPEARANCEUR CLEAR 10/25/2015 1357   LABSPEC 1.031 (H) 10/25/2015 1357   PHURINE 6.0 10/25/2015 1357   GLUCOSEU NEGATIVE 10/25/2015 1357   HGBUR NEGATIVE 10/25/2015 1357   BILIRUBINUR SMALL (A) 10/25/2015 1357   KETONESUR 15 (A) 10/25/2015 1357   PROTEINUR 30 (A) 10/25/2015 1357   UROBILINOGEN 0.2 10/06/2014 0019   NITRITE NEGATIVE 10/25/2015 1357   LEUKOCYTESUR NEGATIVE 10/25/2015 1357   Sepsis Labs: @LABRCNTIP (procalcitonin:4,lacticidven:4)    Recent  Results (from the past 240 hour(s))  MRSA PCR Screening     Status: None   Collection Time: 04/03/16  1:25 AM  Result Value Ref Range Status   MRSA by PCR NEGATIVE NEGATIVE Final      Radiology Studies: Ct Abdomen Pelvis W Contrast Result Date: 04/02/2016 Persistent stranding inflammatory changes around the duodenum with a small fluid collection either representing duodenum diverticulum or penetrating ulcer. Enlarged enhancing lymph nodes in the area. Stomach is distended. Prominent thick-walled distal esophagus. The right upper quadrant changes are persisting from previous study and demonstrating progression. This could represent recurrent infection. However, neoplasm should be excluded due to persistence of findings. The distal esophageal wall thickening is significantly increased since previous study and may represent interval development of severe esophagitis or esophageal tumor could be  present as well. Consider endoscopy for further evaluation of these changes if clinically indicated.     Scheduled Meds: . folic acid  1 mg Intravenous Daily  . piperacillin-tazobactam (ZOSYN)  IV  3.375 g Intravenous Q8H  . sodium chloride flush  3 mL Intravenous Q12H  . sodium chloride flush  3 mL Intravenous Q12H  . thiamine  100 mg Intravenous Daily   Continuous Infusions: . sodium chloride 125 mL/hr at 04/04/16 0413  . pantoprozole (PROTONIX) infusion 8 mg/hr (04/03/16 2253)     LOS: 2 days    Time spent: 25 minutes  Greater than 50% of the time spent on counseling and coordinating the care.   Manson Passey, MD [facility] Pager [phone]  If 7PM-7AM, please contact night-coverage [email or web address] Password [email or web address] 04/04/2016, 11:47 AM

## 2016-04-05 ENCOUNTER — Encounter (HOSPITAL_COMMUNITY): Payer: Self-pay | Admitting: Gastroenterology

## 2016-04-05 LAB — BASIC METABOLIC PANEL
Anion gap: 5 (ref 5–15)
BUN: 11 mg/dL (ref 6–20)
CHLORIDE: 108 mmol/L (ref 101–111)
CO2: 27 mmol/L (ref 22–32)
CREATININE: 1.14 mg/dL (ref 0.61–1.24)
Calcium: 8.5 mg/dL — ABNORMAL LOW (ref 8.9–10.3)
GFR calc non Af Amer: >60 mL/min (ref >60)
Glucose, Bld: 106 mg/dL — ABNORMAL HIGH (ref 65–99)
POTASSIUM: 3.8 mmol/L (ref 3.5–5.1)
SODIUM: 140 mmol/L (ref 135–145)

## 2016-04-05 LAB — CBC
HEMATOCRIT: 34.4 % — AB (ref 39.0–52.0)
Hemoglobin: 11 g/dL — ABNORMAL LOW (ref 13.0–17.0)
MCH: 24.3 pg — AB (ref 26.0–34.0)
MCHC: 32 g/dL (ref 30.0–36.0)
MCV: 75.9 fL — AB (ref 78.0–100.0)
PLATELETS: 205 10*3/uL (ref 150–400)
RBC: 4.53 MIL/uL (ref 4.22–5.81)
RDW: 14.2 % (ref 11.5–15.5)
WBC: 8.1 10*3/uL (ref 4.0–10.5)

## 2016-04-05 LAB — MAGNESIUM: Magnesium: 1.9 mg/dL (ref 1.7–2.4)

## 2016-04-05 MED ORDER — POLYETHYLENE GLYCOL 3350 17 G PO PACK: 17.0000 g | PACK | Freq: Every day | ORAL | Status: DC

## 2016-04-05 NOTE — Progress Notes (Signed)
Progress Note   Subjective  Chief Complaint: Hematemesis, Epigastric Pain  Pt found laying comfortably in bed, he tells me again that he is ready to go home, he has no further abdominal pain and is tolerating a regular diet as of this morning. He "does not know why he is still here". Patient has still not had a BM for the past week per his report.    Objective   Vital signs in last 24 hours: Temp:  [97.5 F (36.4 C)-98.3 F (36.8 C)] 98.3 F (36.8 C) (09/05 0545) Pulse Rate:  [56-82] 82 (09/05 0545) Resp:  [18-20] 18 (09/05 0545) BP: (120-175)/(57-101) 155/57 (09/05 0545) SpO2:  [96 %-100 %] 100 % (09/05 0545) Last BM Date: 04/02/16 General: African American male in NAD Heart:  Regular rate and rhythm; no murmurs Lungs: Respirations even and unlabored, lungs CTA bilaterally Abdomen:  Soft, nontender and nondistended. Normal bowel sounds. Extremities:  Without edema. Neurologic:  Alert and oriented,  grossly normal neurologically. Psych:  Cooperative. Normal mood and affect.  Intake/Output from previous day: 09/04 0701 - 09/05 0700 In: 4925 [P.O.:1800; I.V.:3125] Out: 7300 [Urine:7300] Intake/Output this shift: No intake/output data recorded.  Lab Results:  Recent Labs  04/03/16 0242 04/04/16 0856 04/05/16 0427  WBC 15.4* 6.6 8.1  HGB 11.7* 10.7* 11.0*  HCT 36.1* 33.0* 34.4*  PLT 196 177 205   BMET  Recent Labs  04/03/16 0242 04/04/16 0856 04/05/16 0427  NA 142 136 140  K 3.8 3.4* 3.8  CL 110 108 108  CO2 26 24 27   GLUCOSE 88 140* 106*  BUN 14 8 11   CREATININE 1.05 1.25* 1.14  CALCIUM 8.5* 8.0* 8.5*   LFT  Recent Labs  04/04/16 0856  PROT 5.2*  ALBUMIN 2.6*  AST 17  ALT 10*  ALKPHOS 51  BILITOT 0.4   PT/INR  Recent Labs  04/03/16 0242  LABPROT 13.8  INR 1.06    Studies/Results:  EGD 04/03/16, Dr. Lavon Paganini: Findings: LA Grade D severe (one or more mucosal breaks involving at least 75% of  esophageal circumference)  esophagitis with ulcerated mucosa in the  distal esophagus with no active bleeding was found 30 to 40 cm from the  incisors. A large amount of food (residue) was found in the gastric fundus. Few non-bleeding cratered gastric ulcers with pigmented material were  found in the prepyloric region of the stomach and at the pylorus with  visible suture material. The largest lesion was 5 mm in largest  dimension. Biopsies were taken with a cold forceps from antrum and body  for Helicobacter pylori testing. Few clean based cratered duodenal ulcers with pigmented material were  found in the duodenal bulb covering about 70-80% of the circumference.  The largest lesion was 15 mm in largest dimension. No vissible vessel.  Surrounding erythematous mucosa was oozing on touch. No endoscopic  therapy was done. Impression: - LA Grade D severe erosive esophagitis and  ulcerated distal esophagus. - Non-bleeding gastric ulcers with pigmented  material and visible sutures. Biopsied. - Partial gastric outlet obstruction with extensive  peptic ulcers - Multiple duodenal ulcers with pigmented material  and surrounding erythematous mucosa oozing on touch. Moderate Sedation: N/A- Per Anesthesia Care Recommendation: - Clear liquid diet today and can advance to full  liquid tomorrow if Hgb stable - Continue present medications. PPI gtt for 72  hours and then switch to PPI BID - Await pathology results for H.pylori and treat if  positive. - No aspirin, ibuprofen,  naproxen, or other    non-steroidal anti-inflammatory drugs. -Repeat EGD in 2-3 months for surveillance and  document ulcer healing   Assessment / Plan:   Assessment: 1. Coffee-ground emesis:Patient reports this started on 04/02/16 around 1 PM and lasted for 4 hours, "came on out of the blue", previous history of the same with a duodenal ulcer bleed in 2016 as well as 2014. In March of last year this required oversewing by surgical team. EGD 04/03/16 with findings as above-non-bleeding gastric ulcers, LA Grade D esophagitis and ulcerated distal esophagus, multiple duodenal ulcers and partial gastric outlet obstruction 2. Epigastric pain:Controlled since time of admission 3. History of duodenal ulcer:See history of present illness, bleeds in 2014 and 2016 4. Alcohol abuse:Patient does not admit to overuse of alcohol at this time, though has a history of this in the past and this is likely contributing to continued symptoms as well as medication noncompliance  Plan: 1. Continue regular diet as tolerated 2. Per Dr. Lavon PaganiniNandigam, PPI ggt should be continued for total of 72 hours, per chart review this was started at 0223 on 9/3- this would mean the patient should stay on this till tomorrow morning and then be switched to BID PPI, per pt he is "not going to stay much longer" because he feels fine.  If pt does choose to leave, recommend BID PPI going forward and will need repeat EGD in 3-4 weeks as outpatient 3. Discussed above with Dr. Marina GoodellPerry  Thank you for your kind consultation   LOS: 3 days   Unk LightningJennifer Lynne Lemmon  04/05/2016, 9:11 AM  Pager # [phone]  GI ATTENDING  Case discussed with Dr. Lavon PaganiniNandigam. Interval history and data reviewed. Patient personally seen and examined. Agree with interval progress note as outlined. Patient is clinically stable. No bleeding or vomiting. Tolerating diet. I questioned him on NSAIDs, but he denies. H.  pylori testing pending. Medical compliance an issue. I stressed to him the importance of compliance with PPI, avoiding any type of NSAID medications (I listed multiple agents for him), and keeping GI follow-up as we have kindly arranged. If the etiology of his ulcer disease remains unclear, would rule out Zollinger-Ellison syndrome. He will follow-up with Dr. Lavon PaganiniNandigam. Molli Knockkay to go from a GI standpoint. Please call for any questions or problems. Thanks  Wilhemina BonitoJohn N. Eda KeysPerry, Jr., M.D. [facility] Division of Gastroenterology

## 2016-04-05 NOTE — Progress Notes (Addendum)
Patient ID: [name], male   DOB: 03-04-1961, 55 y.o.   MRN: [id number]  PROGRESS NOTE    [name]  AOZ:[id number] DOB: 1961/02/12 DOA: 04/02/2016  PCP: [name], MD   Brief Narrative:  55 y.o. gentleman with a history of active polysubstance abuse, duodenal ulcers with bleeding, recurrent upper GI bleeds requiring blood transfusion (he actually went to the OR in March 2016 for intractable bleeding from a duodenal ulcer), GERD who presented to [facility] ED for evaluation of coffee ground emesis that started 1 night prior to this admission, associated with epigastric abdominal pain.    CT scan showed multiple findings including thickened esophageal wall, inflammatory changes around the duodenum with a small fluid collection and progressive changes in the RUQ (neoplasm could not be excluded).  He had a WBC count of 17.  He received 1L of NS, IV pepcid, IV protonix (40mg ), GI cocktail and zofran.  Hemoglobin was within normal limits on presentation.  Pt admitted for management of GI bleed. GI has seen in consultation.   Assessment & Plan:   Principal Problem:   Acute upper GI bleed / Hematemesis / Acute blood loss anemia / Leukocytosis - CT scan showed multiple findings including thickened esophageal wall, inflammatory changes around the duodenum with a small fluid collection and progressive changes in the RUQ (neoplasm could not be excluded) - EGD showed severe erosive esophagitis and ulcerated distal esophagus, non bleeding gastric ulcers wit pigmented material and visible sutures, biopsied, partial gastric outlet obstruction with extensive peptic ulcers and multiple duodenal ulcers with pigmented material and surround erythematous mucosa oozing on touch. - Continue protonix drip through 9/6 per GI (total of 72 hours) then switch to PPI BID. Pt will need repeat EGD in 2-3 weeks  - Stopped zosyn 9/4    History of alcohol abuse - Continue thiamine, folic acid and multivitamin - No  withdrawals     Hypokalemia - Due to GI losses - Supplemented - Magnesium level normal     Essential hypertension - He is on as needed hydralazine - SBP in 140's    DVT prophylaxis: SCD's bilaterally  Code Status: full code  Family Communication: no family at the bedside this am Disposition Plan: home once protonix drip done, 9/6   Consultants:   GI  Procedures:   EGD 04/03/2016 - severe erosive esophagitis and ulcerated distal esophagus, non bleeding gastric ulcers wit pigmented material and visible sutures, biopsied, partial gastric outlet obstruction with extensive peptic ulcers and multiple duodenal ulcers with pigmented material and surround erythematous mucosa oozing on touch.  Antimicrobials:   Zosyn stopped 9/4   Subjective: No overnight events.   Objective: Vitals:   04/04/16 1306 04/04/16 2125 04/05/16 0545 04/05/16 1700  BP: (!) 175/101 120/77 (!) 155/57 (!) 144/64  Pulse: (!) 57 71 82 80  Resp: 18 18 18 17   Temp: 98 F (36.7 C) 97.5 F (36.4 C) 98.3 F (36.8 C) 98.6 F (37 C)  TempSrc: Oral Oral Oral Oral  SpO2: 100% 100% 100% 100%  Weight:      Height:        Intake/Output Summary (Last 24 hours) at 04/05/16 1717 Last data filed at 04/05/16 1312  Gross per 24 hour  Intake             4405 ml  Output             6250 ml  Net            -1845 ml  Filed Weights   04/02/16 2114 04/03/16 0200  Weight: 77.1 kg (170 lb) 71.5 kg (157 lb 10.1 oz)    Examination:  General exam: no distress  Respiratory system: No wheezing or rhonchi  Cardiovascular system: S1 & S2 heard, RRR Gastrointestinal system: (+) BS, non tender, non distended  Central nervous system: no focal deficits Extremities: No edema, palpable pulses  Skin: no lesions, no ulcers  Psychiatry: Normal mood and behavior    Data Reviewed: I have personally reviewed following labs and imaging studies  CBC:  Recent Labs Lab 04/02/16 1500 04/03/16 0242 04/04/16 0856  04/05/16 0427  WBC 17.5* 15.4* 6.6 8.1  NEUTROABS 15.9*  --  4.2  --   HGB 13.1 11.7* 10.7* 11.0*  HCT 41.2 36.1* 33.0* 34.4*  MCV 77.4* 76.8* 78.0 75.9*  PLT 239 196 177 205   Basic Metabolic Panel:  Recent Labs Lab 04/02/16 1500 04/03/16 0242 04/04/16 0856 04/05/16 0427  NA 141 142 136 140  K 3.5 3.8 3.4* 3.8  CL 102 110 108 108  CO2 30 26 24 27   GLUCOSE 105* 88 140* 106*  BUN 16 14 8 11   CREATININE 1.03 1.05 1.25* 1.14  CALCIUM 9.0 8.5* 8.0* 8.5*  MG  --   --   --  1.9   GFR: Estimated Creatinine Clearance: 73.2 mL/min (by C-G formula based on SCr of 1.14 mg/dL). Liver Function Tests:  Recent Labs Lab 04/02/16 1500 04/03/16 0242 04/04/16 0856  AST 21 17 17   ALT 14* 12* 10*  ALKPHOS 82 64 51  BILITOT 0.7 0.8 0.4  PROT 7.8 6.3* 5.2*  ALBUMIN 4.0 3.2* 2.6*    Recent Labs Lab 04/02/16 1500  LIPASE 24   No results for input(s): AMMONIA in the last 168 hours. Coagulation Profile:  Recent Labs Lab 04/03/16 0242  INR 1.06   Cardiac Enzymes: No results for input(s): CKTOTAL, CKMB, CKMBINDEX, TROPONINI in the last 168 hours. BNP (last 3 results) No results for input(s): PROBNP in the last 8760 hours. HbA1C: No results for input(s): HGBA1C in the last 72 hours. CBG: No results for input(s): GLUCAP in the last 168 hours. Lipid Profile: No results for input(s): CHOL, HDL, LDLCALC, TRIG, CHOLHDL, LDLDIRECT in the last 72 hours. Thyroid Function Tests: No results for input(s): TSH, T4TOTAL, FREET4, T3FREE, THYROIDAB in the last 72 hours. Anemia Panel: No results for input(s): VITAMINB12, FOLATE, FERRITIN, TIBC, IRON, RETICCTPCT in the last 72 hours. Urine analysis:    Component Value Date/Time   COLORURINE AMBER (A) 10/25/2015 1357   APPEARANCEUR CLEAR 10/25/2015 1357   LABSPEC 1.031 (H) 10/25/2015 1357   PHURINE 6.0 10/25/2015 1357   GLUCOSEU NEGATIVE 10/25/2015 1357   HGBUR NEGATIVE 10/25/2015 1357   BILIRUBINUR SMALL (A) 10/25/2015 1357    KETONESUR 15 (A) 10/25/2015 1357   PROTEINUR 30 (A) 10/25/2015 1357   UROBILINOGEN 0.2 10/06/2014 0019   NITRITE NEGATIVE 10/25/2015 1357   LEUKOCYTESUR NEGATIVE 10/25/2015 1357   Sepsis Labs: @LABRCNTIP (procalcitonin:4,lacticidven:4)    Recent Results (from the past 240 hour(s))  MRSA PCR Screening     Status: None   Collection Time: 04/03/16  1:25 AM  Result Value Ref Range Status   MRSA by PCR NEGATIVE NEGATIVE Final      Radiology Studies: Ct Abdomen Pelvis W Contrast Result Date: 04/02/2016 Persistent stranding inflammatory changes around the duodenum with a small fluid collection either representing duodenum diverticulum or penetrating ulcer. Enlarged enhancing lymph nodes in the area. Stomach is distended. Prominent thick-walled distal  esophagus. The right upper quadrant changes are persisting from previous study and demonstrating progression. This could represent recurrent infection. However, neoplasm should be excluded due to persistence of findings. The distal esophageal wall thickening is significantly increased since previous study and may represent interval development of severe esophagitis or esophageal tumor could be present as well. Consider endoscopy for further evaluation of these changes if clinically indicated.     Scheduled Meds: . folic acid  1 mg Intravenous Daily  . polyethylene glycol  17 g Oral Daily  . sodium chloride flush  3 mL Intravenous Q12H  . thiamine  100 mg Intravenous Daily   Continuous Infusions: . sodium chloride 1,000 mL (04/04/16 1221)  . pantoprozole (PROTONIX) infusion 8 mg/hr (04/03/16 2253)     LOS: 3 days    Time spent: 15 minutes  Greater than 50% of the time spent on counseling and coordinating the care.   Manson PasseyEVINE, Jalayna Josten, MD [facility] Pager [phone]  If 7PM-7AM, please contact night-coverage [email or web address] Password [email or web address] 04/05/2016, 5:17 PM

## 2016-04-05 NOTE — Clinical Social Work Note (Signed)
Clinical Social Work Assessment  Patient Details  Name: [name] MRN: [id number] Date of Birth: 1960/12/11  Date of referral:  04/05/16               Reason for consult:  Substance Use/ETOH Abuse                Permission sought to share information with:  Case Manager Permission granted to share information::     Name::        Agency::     Relationship::    Charlotte Brafford  Contact Information:     Ridgely, Anastacio [phone]     Housing/Transportation Living arrangements for the past 2 months:  [place] of Information:  Patient Patient Interpreter Needed:  None Criminal Activity/Legal Involvement Pertinent to Current Situation/Hospitalization:  No - Comment as needed Significant Relationships:  Other Family Members Lives with:  Friends, Self Do you feel safe going back to the place where you live?  Yes Need for family participation in patient care:  No   Care giving concerns:  Patient reports he has been drinking alcohol since he was 55 years old. Patient reports he wants to stop drinking but "it so easy to drink around my friends and do what they are doing." Patient reports he is open to outpatient resources at this time.    Social Worker assessment / plan:  LCSWA met with patient at bedside, patient was eating lunch and agreeable to talk. Patient reports a history of etoh, tobacco use, and marijuana occasionally. Patient reports he has been to several [employer] groups and court order to attend the MADD program. Patient reports he understands the drinking is affecting his health. Patient reports his family is supportive of him wanting to quit drinking and encourage him to stop.  LCSWA assisted patient in finding an outpatient SA treatment center near his home. Patient reports he will [facility] on [address]   Employment status:  Unemployed Forensic scientist:  Medicaid In [place] PT Recommendations:  Not assessed at  this time Information / Referral to community resources:  Outpatient Substance Abuse Treatment Options, Residential Substance Abuse Treatment Options  Patient/Family's Response to care:  Patient is agreeable at this time.   Patient/Family's Understanding of and Emotional Response to Diagnosis, Current Treatment, and Prognosis: "I want help but I need to know where to go." Patient was receptive to resources and education about SA services.   Emotional Assessment Appearance:  Appears older than stated age Attitude/Demeanor/Rapport:    Affect (typically observed):  Accepting Orientation:  Oriented to Self, Oriented to Place, Oriented to  Time, Oriented to Situation Alcohol / Substance use:  Alcohol Use, Tobacco Use, Illicit Drugs Psych involvement (Current and /or in the community):  No (Comment)  Discharge Needs  Concerns to be addressed:    Readmission within the last 30 days:  No Current discharge risk:  None, Substance Abuse Barriers to Discharge:  No Barriers Identified   Lia Hopping, LCSW 04/05/2016, 3:31 PM

## 2016-04-06 DIAGNOSIS — I10 Essential (primary) hypertension: Secondary | ICD-10-CM

## 2016-04-06 DIAGNOSIS — D72829 Elevated white blood cell count, unspecified: Secondary | ICD-10-CM

## 2016-04-06 DIAGNOSIS — K264 Chronic or unspecified duodenal ulcer with hemorrhage: Secondary | ICD-10-CM

## 2016-04-06 DIAGNOSIS — F1010 Alcohol abuse, uncomplicated: Secondary | ICD-10-CM

## 2016-04-06 LAB — CBC
HCT: 35.3 % — ABNORMAL LOW (ref 39.0–52.0)
HEMOGLOBIN: 11.5 g/dL — AB (ref 13.0–17.0)
MCH: 24.4 pg — ABNORMAL LOW (ref 26.0–34.0)
MCHC: 32.6 g/dL (ref 30.0–36.0)
MCV: 74.8 fL — ABNORMAL LOW (ref 78.0–100.0)
Platelets: 223 10*3/uL (ref 150–400)
RBC: 4.72 MIL/uL (ref 4.22–5.81)
RDW: 14.1 % (ref 11.5–15.5)
WBC: 7 10*3/uL (ref 4.0–10.5)

## 2016-04-06 MED ORDER — PANTOPRAZOLE SODIUM 40 MG PO TBEC: 40.0000 mg | DELAYED_RELEASE_TABLET | Freq: Every day | ORAL | 0 refills | Status: DC

## 2016-04-06 MED ORDER — FOLIC ACID 1 MG PO TABS: 1.0000 mg | ORAL_TABLET | Freq: Every day | ORAL | 0 refills | Status: AC

## 2016-04-06 MED ORDER — THERA VITAL M PO TABS: 1.0000 | ORAL_TABLET | Freq: Every day | ORAL | 0 refills | Status: AC

## 2016-04-06 MED ORDER — VITAMIN B-1 100 MG PO TABS: 100.0000 mg | ORAL_TABLET | Freq: Every day | ORAL | 0 refills | Status: AC

## 2016-04-06 NOTE — Discharge Summary (Signed)
Physician Discharge Summary  Ryan GradRandy Shatzer ZOX:[id number] DOB: 03/27/1961 DOA: 04/02/2016  PCP: Jackie PlumSEI-BONSU,GEORGE, MD  Admit date: 04/02/2016 Discharge date: 04/06/2016  Time spent: 45 minutes  Recommendations for Outpatient Follow-up:  Patient will be discharged to home.  Patient will need to follow up with primary care provider within one week of discharge.  Follow up with gastroenterology at specified time. Will need repeat EGD in 2-3 weeks.Patient should continue medications as prescribed.  Patient should follow a soft diet.   Discharge Diagnoses:  Acute upper GI bleed / Hematemesis / Acute blood loss anemia / Leukocytosis History of alcohol abuse Hypokalemia Essential hypertension  Discharge Condition: Stable  Diet recommendation: soft  Filed Weights   04/02/16 2114 04/03/16 0200  Weight: 77.1 kg (170 lb) 71.5 kg (157 lb 10.1 oz)    History of present illness:  On 04/02/2016 by Dr. Michael LitterNikki Carter [name] is a 55 y.o. gentleman with a history of active polysubstance abuse, duodenal ulcers with bleeding, recurrent upper GI bleeds requiring blood transfusion (he actually went to the OR in March 2016 for intractable bleeding from a duodenal ulcer), and GERD who presented to the ED today for evaluation of coffee ground emesis that started last night. This is associated with epigastric abdominal pain.  No documented fever, but he has had sweats.  He denies light-headedness, syncope, chest pain, or shortness of breath.  No diarrhea.  No hematuria.  Hospital Course:   Acute upper GI bleed / Hematemesis / Acute blood loss anemia / Leukocytosis -CT scan showed multiple findings including thickened esophageal wall, inflammatory changes around the duodenum with a small fluid collection and progressive changes in the RUQ (neoplasm could not be excluded) -EGD showed severe erosive esophagitis and ulcerated distal esophagus, non bleeding gastric ulcers wit pigmented material and visible sutures,  biopsied, partial gastric outlet obstruction with extensive peptic ulcers and multiple duodenal ulcers with pigmented material and surround erythematous mucosa oozing on touch. -Was placed on protonix drip through 9/6 per GI (total of 72 hours) then switch to PPI BID. Pt will need repeat EGD in 2-3 weeks  -Stopped zosyn 9/4 -Leukocytosis resolved -Hemoglobin stable at 11.5 today  History of alcohol abuse -Continue thiamine, folic acid and multivitamin -No signs or symptoms of withdrawal   Hypokalemia -likely due to GI losses -Supplemented -Magnesium level normal   Essential hypertension -Placed on hydralazine PRN -SBP in 140s  Procedures: EGD 04/03/2016 - severe erosive esophagitis and ulcerated distal esophagus, non bleeding gastric ulcers wit pigmented material and visible sutures, biopsied, partial gastric outlet obstruction with extensive peptic ulcers and multiple duodenal ulcers with pigmented material and surround erythematous mucosa oozing on touch.  Consultations:   Discharge Exam: Vitals:   04/05/16 2111 04/06/16 0539  BP: (!) 149/78 (!) 145/78  Pulse: 74 64  Resp: 16 16  Temp: 98.6 F (37 C) 98.5 F (36.9 C)     General: Well developed, well nourished, NAD, appears stated age  HEENT: NCAT,mucous membranes moist. Poor dentition  Cardiovascular: S1 S2 auscultated, no rubs, murmurs or gallops. Regular rate and rhythm.  Respiratory: Clear to auscultation bilaterally with equal chest rise  Abdomen: Soft, nontender, nondistended, + bowel sounds  Extremities: warm dry without cyanosis clubbing or edema  Neuro: AAOx3,nonfocal  Psych: Appropriate    Discharge Instructions Discharge Instructions    Discharge instructions    Complete by:  As directed   Patient will be discharged to home.  Patient will need to follow up with primary care provider within one  week of discharge.  Follow up with gastroenterology at specified time. Will need repeat EGD in 2-3  weeks.Patient should continue medications as prescribed.  Patient should follow a soft diet.     Current Discharge Medication List    START taking these medications   Details  folic acid (FOLVITE) 1 MG tablet Take 1 tablet (1 mg total) by mouth daily. Qty: 30 tablet, Refills: 0    Multiple Vitamins-Minerals (MULTIVITAMIN) tablet Take 1 tablet by mouth daily. Qty: 30 tablet, Refills: 0    pantoprazole (PROTONIX) 40 MG tablet Take 1 tablet (40 mg total) by mouth daily. Qty: 60 tablet, Refills: 0    thiamine (VITAMIN B-1) 100 MG tablet Take 1 tablet (100 mg total) by mouth daily. Qty: 30 tablet, Refills: 0       Allergies  Allergen Reactions  . Nsaids Other (See Comments)    BLEEDING ULCER  . Chocolate Nausea And Vomiting  . Peanut-Containing Drug Products Nausea And Vomiting  . Spinach Nausea And Vomiting   Follow-up Information    OSEI-BONSU,GEORGE, MD. Schedule an appointment as soon as possible for a visit in 1 week(s).   Specialty:  Internal Medicine Why:  Hospital follow up Contact information: [address] [place] [address] [phone]        Marsa Aris, MD Follow up in 2 week(s).   Specialty:  Gastroenterology Why:  Hospital follow up Contact information: [address] [place] [address] [phone]            The results of significant diagnostics from this hospitalization (including imaging, microbiology, ancillary and laboratory) are listed below for reference.    Significant Diagnostic Studies: Ct Abdomen Pelvis W Contrast  Result Date: 04/02/2016 CLINICAL DATA:  Coffee-ground emesis since last night. Mid abdominal pain beginning last night. Alcohol use. EXAM: CT ABDOMEN AND PELVIS WITH CONTRAST TECHNIQUE: Multidetector CT imaging of the abdomen and pelvis was performed using the standard protocol following bolus administration of intravenous contrast. CONTRAST:  ISOVUE-300 IOPAMIDOL (ISOVUE-300) INJECTION  61% COMPARISON:  06/19/2015 FINDINGS: Lung bases are clear. The distal esophagus demonstrates diffuse wall thickening and edema. This may reflect changes of esophagitis although esophageal neoplasm is not excluded. Focal lymphadenopathy adjacent to the esophagus. Consider endoscopy for additional evaluation. The stomach is distended without discrete wall thickening. Small bowel are not abnormally distended. Mild wall thickening demonstrated in the duodenum suggesting duodenitis. Changes may reflect peptic ulcer disease. Gas and fluid collection adjacent to the duodenum ball could represent a diverticulum or a penetrating ulcer. Again demonstrated is inflammatory stranding adjacent to the duodenum with enlarged regional enhancing lymph nodes. While this may represent recurrent inflammatory process, neoplasm should be excluded. The gallbladder, bile ducts, and pancreatic duct are unremarkable. Liver, spleen, adrenal glands, kidneys, abdominal aorta, inferior vena cava, and retroperitoneal lymph nodes are unremarkable. Stool and gas filled colon without distention or wall thickening. No free air or free fluid demonstrated in the abdomen. Pelvis: The appendix is normal. Bladder wall is not thickened. Prostate gland is not enlarged. No free or loculated pelvic fluid collections. No pelvic mass or lymphadenopathy. No destructive bone lesions. IMPRESSION: Persistent stranding inflammatory changes around the duodenum with a small fluid collection either representing duodenum diverticulum or penetrating ulcer. Enlarged enhancing lymph nodes in the area. Stomach is distended. Prominent thick-walled distal esophagus. The right upper quadrant changes are persisting from previous study and demonstrating progression. This could represent recurrent infection. However, neoplasm should be excluded due to persistence  of findings. The distal esophageal wall thickening is significantly increased since previous study and may represent  interval development of severe esophagitis or esophageal tumor could be present as well. Consider endoscopy for further evaluation of these changes if clinically indicated. Electronically Signed   By: Burman Nieves M.D.   On: 04/02/2016 18:17    Microbiology: Recent Results (from the past 240 hour(s))  MRSA PCR Screening     Status: None   Collection Time: 04/03/16  1:25 AM  Result Value Ref Range Status   MRSA by PCR NEGATIVE NEGATIVE Final    Comment:        The GeneXpert MRSA Assay (FDA approved for NASAL specimens only), is one component of a comprehensive MRSA colonization surveillance program. It is not intended to diagnose MRSA infection nor to guide or monitor treatment for MRSA infections.      Labs: Basic Metabolic Panel:  Recent Labs Lab 04/02/16 1500 04/03/16 0242 04/04/16 0856 04/05/16 0427  NA 141 142 136 140  K 3.5 3.8 3.4* 3.8  CL 102 110 108 108  CO2 30 26 24 27   GLUCOSE 105* 88 140* 106*  BUN 16 14 8 11   CREATININE 1.03 1.05 1.25* 1.14  CALCIUM 9.0 8.5* 8.0* 8.5*  MG  --   --   --  1.9   Liver Function Tests:  Recent Labs Lab 04/02/16 1500 04/03/16 0242 04/04/16 0856  AST 21 17 17   ALT 14* 12* 10*  ALKPHOS 82 64 51  BILITOT 0.7 0.8 0.4  PROT 7.8 6.3* 5.2*  ALBUMIN 4.0 3.2* 2.6*    Recent Labs Lab 04/02/16 1500  LIPASE 24   No results for input(s): AMMONIA in the last 168 hours. CBC:  Recent Labs Lab 04/02/16 1500 04/03/16 0242 04/04/16 0856 04/05/16 0427 04/06/16 0452  WBC 17.5* 15.4* 6.6 8.1 7.0  NEUTROABS 15.9*  --  4.2  --   --   HGB 13.1 11.7* 10.7* 11.0* 11.5*  HCT 41.2 36.1* 33.0* 34.4* 35.3*  MCV 77.4* 76.8* 78.0 75.9* 74.8*  PLT 239 196 177 205 223   Cardiac Enzymes: No results for input(s): CKTOTAL, CKMB, CKMBINDEX, TROPONINI in the last 168 hours. BNP: BNP (last 3 results) No results for input(s): BNP in the last 8760 hours.  ProBNP (last 3 results) No results for input(s): PROBNP in the last 8760  hours.  CBG: No results for input(s): GLUCAP in the last 168 hours.     SignedEdsel Petrin  [facility] 04/06/2016, 10:34 AM

## 2016-04-06 NOTE — Progress Notes (Signed)
Discharge instructions discussed with patient, verbalized agreement and understanding, patient requests bus pass to get home, SW made aware

## 2016-04-06 NOTE — Discharge Instructions (Signed)

## 2016-04-11 ENCOUNTER — Other Ambulatory Visit: Payer: Self-pay

## 2016-04-11 DIAGNOSIS — K279 Peptic ulcer, site unspecified, unspecified as acute or chronic, without hemorrhage or perforation: Secondary | ICD-10-CM

## 2016-04-11 MED ORDER — BIS SUBCIT-METRONID-TETRACYC 140-125-125 MG PO CAPS: 3.0000 | ORAL_CAPSULE | Freq: Three times a day (TID) | ORAL | 0 refills | Status: DC

## 2016-04-11 MED ORDER — OMEPRAZOLE 20 MG PO CPDR: 20.0000 mg | DELAYED_RELEASE_CAPSULE | Freq: Two times a day (BID) | ORAL | 0 refills | Status: DC

## 2016-04-25 ENCOUNTER — Other Ambulatory Visit: Payer: Self-pay

## 2016-06-14 LAB — CBC

## 2016-07-30 ENCOUNTER — Other Ambulatory Visit: Payer: Self-pay

## 2016-07-30 ENCOUNTER — Encounter (HOSPITAL_COMMUNITY): Payer: Self-pay | Admitting: Emergency Medicine

## 2016-07-30 ENCOUNTER — Emergency Department (HOSPITAL_COMMUNITY)
Admission: EM | Admit: 2016-07-30 | Discharge: 2016-07-30 | Disposition: A | Discharge status: Home / Self Care | Payer: Medicaid Other | Attending: Emergency Medicine | Admitting: Emergency Medicine

## 2016-07-30 DIAGNOSIS — R1013 Epigastric pain: Secondary | ICD-10-CM | POA: Diagnosis present

## 2016-07-30 DIAGNOSIS — I10 Essential (primary) hypertension: Secondary | ICD-10-CM | POA: Insufficient documentation

## 2016-07-30 DIAGNOSIS — Z91010 Allergy to peanuts: Secondary | ICD-10-CM | POA: Diagnosis not present

## 2016-07-30 DIAGNOSIS — F17200 Nicotine dependence, unspecified, uncomplicated: Secondary | ICD-10-CM | POA: Diagnosis not present

## 2016-07-30 LAB — COMPREHENSIVE METABOLIC PANEL
ALBUMIN: 3.9 g/dL (ref 3.5–5.0)
ALK PHOS: 67 U/L (ref 38–126)
ALT: 15 U/L — ABNORMAL LOW (ref 17–63)
ANION GAP: 11 (ref 5–15)
AST: 35 U/L (ref 15–41)
BILIRUBIN TOTAL: 1.6 mg/dL — AB (ref 0.3–1.2)
BUN: 17 mg/dL (ref 6–20)
CALCIUM: 9 mg/dL (ref 8.9–10.3)
CO2: 27 mmol/L (ref 22–32)
Chloride: 101 mmol/L (ref 101–111)
Creatinine, Ser: 1.23 mg/dL (ref 0.61–1.24)
GLUCOSE: 98 mg/dL (ref 65–99)
POTASSIUM: 5.1 mmol/L (ref 3.5–5.1)
Sodium: 139 mmol/L (ref 135–145)
TOTAL PROTEIN: 7.7 g/dL (ref 6.5–8.1)

## 2016-07-30 LAB — URINALYSIS, ROUTINE W REFLEX MICROSCOPIC
BACTERIA UA: NONE SEEN
GLUCOSE, UA: NEGATIVE mg/dL
HGB URINE DIPSTICK: NEGATIVE
KETONES UR: 20 mg/dL — AB
LEUKOCYTES UA: NEGATIVE
NITRITE: NEGATIVE
PH: 5 (ref 5.0–8.0)
PROTEIN: 100 mg/dL — AB
Specific Gravity, Urine: 1.033 — ABNORMAL HIGH (ref 1.005–1.030)

## 2016-07-30 LAB — CBC
HEMATOCRIT: 45.3 % (ref 39.0–52.0)
HEMOGLOBIN: 14.8 g/dL (ref 13.0–17.0)
MCH: 24.1 pg — ABNORMAL LOW (ref 26.0–34.0)
MCHC: 32.7 g/dL (ref 30.0–36.0)
MCV: 73.8 fL — ABNORMAL LOW (ref 78.0–100.0)
Platelets: 185 10*3/uL (ref 150–400)
RBC: 6.14 MIL/uL — AB (ref 4.22–5.81)
RDW: 18.1 % — ABNORMAL HIGH (ref 11.5–15.5)
WBC: 11.9 10*3/uL — AB (ref 4.0–10.5)

## 2016-07-30 LAB — LIPASE, BLOOD: Lipase: 17 U/L (ref 11–51)

## 2016-07-30 MED ORDER — GI COCKTAIL ~~LOC~~
30.0000 mL | Freq: Once | ORAL | Status: AC
  Administered 2016-07-30: 30 mL via ORAL
  Filled 2016-07-30: qty 30

## 2016-07-30 MED ORDER — OMEPRAZOLE 20 MG PO CPDR: 20.0000 mg | DELAYED_RELEASE_CAPSULE | Freq: Every day | ORAL | 0 refills | Status: DC

## 2016-07-30 NOTE — Discharge Instructions (Signed)
Please take 1 tablet of omeprazole daily 30 minutes before breakfast. Plenty of fluids. Please stay on diet that does not aggravate your reflux. Please schedule point with her primary care physician in 2-5 days for follow-up.   Get help right away if: Your pain does not go away as soon as your health care provider told you to expect. You cannot stop throwing up. Your pain is only in areas of the abdomen, such as the right side or the left lower portion of the abdomen. You have bloody or black stools, or stools that look like tar. You have severe pain, cramping, or bloating in your abdomen. You have signs of dehydration, such as: Dark urine, very little urine, or no urine. Cracked lips. Dry mouth. Sunken eyes. Sleepiness. Weakness.

## 2016-07-30 NOTE — ED Triage Notes (Signed)
Patient is complaining of abdominal pain associated with n/v/d.  Patient denies blood in stool.  Pain radiates to RLQ and right flank pain.     BP: 118/64 HR: 100 99% on room air   Pain 6 out 10

## 2016-07-30 NOTE — ED Provider Notes (Signed)
[facility]-EMERGENCY DEPT Provider Note   CSN: [id number] Arrival date & time: 07/30/16  1053     History   Chief Complaint Chief Complaint  Patient presents with  . Abdominal Pain    HPI [name] is a 55 y.o. male  Patient is a 55 year old male with history of GERD, upper GI bleed, presenting with upper periumbilical abdominal pain since yesterday. Patient reports symptoms as Intermittent, burning, central to epigastric region, sometimes radiating up and sometimes mildly radiating to his back and shoulder blades. Patient reports 3 episodes of vomiting, with none of them having appearance of blood. Patient states that he gets these symptoms often and thinks its associated with his history of GERD. He reports certain foods agitate his symptoms do not. He states that last time he was here he had an upper GI bleed, where he was vomiting warm blood. He says that he does not feel that way today, last time being much worse. Patient states that he is noncompliant with his omeprazole medication. Patient states that he has tried ibuprofen 800mg  for symptoms and does not know what else he takes. Patient denies any urinary symptoms, changes in bowel movements, blood in stools, chest pain, shortness of breath, fevers, chills.    The history is provided by the patient.  Abdominal Pain   Associated symptoms include vomiting. Pertinent negatives include fever, diarrhea, nausea, dysuria, hematuria and arthralgias.    Past Medical History:  Diagnosis Date  . Abdominal pain, other specified site   . Abnormal CT scan, esophagus 03/2012  . GERD (gastroesophageal reflux disease)   . Substance abuse 03/2012   tox screen positive for cocaine.     Patient Active Problem List   Diagnosis Date Noted  . History of alcohol abuse   . 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 Duodenitis 04/02/2016  . GI bleed 04/02/2016  . Personality disorder 10/20/2014  . Acute upper GI bleed   . Ileus, postoperative (HCC)  10/15/2014  . Aspiration pneumonia (HCC) 10/14/2014  . Polysubstance abuse 10/09/2014  . UGIB (upper gastrointestinal bleed) 10/08/2014  . Leukocytosis 04/30/2013  . Microcytic anemia 04/30/2013  . Hematemesis 08/10/2012  . Anemia due to GI blood loss 08/10/2012  . Acute respiratory failure (HCC) 08/10/2012  . Hypertension 08/10/2012  . Duodenal ulcer with hemorrhage s/p oversew 10/11/2014 08/10/2012  . Encephalopathy acute 08/10/2012    Past Surgical History:  Procedure Laterality Date  . ESOPHAGOGASTRODUODENOSCOPY  08/10/2012   Procedure: ESOPHAGOGASTRODUODENOSCOPY (EGD);  Surgeon: Rachael Feeaniel P Jacobs, MD;  [facility];  Service: Endoscopy;  Laterality: N/A;  will be done in room 1   . ESOPHAGOGASTRODUODENOSCOPY Left 05/01/2013   Procedure: ESOPHAGOGASTRODUODENOSCOPY (EGD);  Surgeon: Charna ElizabethJyothi Mann, MD;  Location: [facility] ENDOSCOPY;  Service: Endoscopy;  Laterality: Left;  . ESOPHAGOGASTRODUODENOSCOPY N/A 05/11/2013   Procedure: ESOPHAGOGASTRODUODENOSCOPY (EGD);  Surgeon: Graylin ShiverSalem F Ganem, MD;  [facility];  Service: Endoscopy;  Laterality: N/A;  . ESOPHAGOGASTRODUODENOSCOPY N/A 10/06/2014   Procedure: ESOPHAGOGASTRODUODENOSCOPY (EGD);  Surgeon: Beverley FiedlerJay M Pyrtle, MD;  [facility];  Service: Endoscopy;  Laterality: N/A;  . ESOPHAGOGASTRODUODENOSCOPY N/A 10/09/2014   Procedure: ESOPHAGOGASTRODUODENOSCOPY (EGD);  Surgeon: Beverley FiedlerJay M Pyrtle, MD;  [facility];  Service: Gastroenterology;  Laterality: N/A;  Bedside  . ESOPHAGOGASTRODUODENOSCOPY N/A 04/03/2016   Procedure: ESOPHAGOGASTRODUODENOSCOPY (EGD);  Surgeon: Napoleon FormKavitha V Nandigam, MD;  Location: [facility] ORS;  Service: Gastroenterology;  Laterality: N/A;  . LAPAROTOMY  08/10/2012   Procedure: EXPLORATORY LAPAROTOMY;  Surgeon: Cherylynn RidgesJames O Wyatt, MD;  Location: [facility] OR;  Service: General;  Laterality: N/A;  . LAPAROTOMY N/A 10/10/2014   Procedure: EXPLORATORY LAPAROTOMY WITH GASTROPATHY AND OVER SEWEN DUODENAL ULCER REGION ;  Surgeon:  [name], MD;  Location: [facility] ORS;  Service: General;  Laterality: N/A;  . undescended testicle     on right. noted on 03/2012 CT scan.  no surgery referral.        Home Medications    Prior to Admission medications   Medication Sig Start Date End Date Taking? Authorizing Provider  bismuth-metronidazole-tetracycline [facility]) [phone] MG capsule Take 3 capsules by mouth 4 (four) times daily -  before meals and at bedtime. 04/11/16  Yes [name], MD  ibuprofen (ADVIL,MOTRIN) 200 MG tablet Take 400 mg by mouth every 6 (six) hours as needed.   Yes Historical Provider, MD  Multiple Vitamins-Minerals (MULTIVITAMIN) tablet Take 1 tablet by mouth daily. 04/06/16  Yes Maryann Mikhail, DO  pantoprazole (PROTONIX) 40 MG tablet Take 1 tablet (40 mg total) by mouth daily. 04/06/16  Yes Maryann Mikhail, DO  folic acid (FOLVITE) 1 MG tablet Take 1 tablet (1 mg total) by mouth daily. Patient not taking: Reported on 07/30/2016 04/06/16   [name], DO  omeprazole (PRILOSEC) 20 MG capsule Take 1 capsule (20 mg total) by mouth daily. 07/30/16   Kamela Blansett Manuel Heron, [place]  thiamine (VITAMIN B-1) 100 MG tablet Take 1 tablet (100 mg total) by mouth daily. Patient not taking: Reported on 07/30/2016 04/06/16   [name], DO    Family History No family history on file.  Social History Social History  Substance Use Topics  . Smoking status: Light Tobacco Smoker    Packs/day: 1.00    Years: 34.00  . Smokeless tobacco: Never Used  . Alcohol use Yes     Comment: 40oz beer/day     Allergies   Nsaids; Chocolate; Peanut-containing drug products; and Spinach   Review of Systems Review of Systems  Constitutional: Negative for chills and fever.  HENT: Negative for congestion and sore throat.   Eyes: Negative for pain and visual disturbance.  Respiratory: Negative for cough and shortness of breath.   Cardiovascular: Negative for chest pain and palpitations.  Gastrointestinal:  Positive for abdominal pain and vomiting. Negative for blood in stool, diarrhea and nausea.  Genitourinary: Negative for dysuria and hematuria.  Musculoskeletal: Negative for arthralgias, back pain, neck pain and neck stiffness.  Skin: Negative for color change and rash.  Neurological: Negative for syncope and numbness.     Physical Exam Updated Vital Signs BP 155/98 (BP Location: Right Arm)   Pulse 84   Temp 98.4 F (36.9 C) (Oral)   Resp 18   Ht 5\' 9"  (1.753 m)   Wt 74.8 kg   SpO2 99%   BMI 24.37 kg/m   Physical Exam  Constitutional: He is oriented to person, place, and time. He appears well-developed and well-nourished.  HENT:  Head: Normocephalic and atraumatic.  Nose: Nose normal.  Mouth/Throat: Oropharynx is clear and moist.  Eyes: Conjunctivae and EOM are normal. Pupils are equal, round, and reactive to light.  Neck: Normal range of motion. Neck supple.  Cardiovascular: Normal rate and normal heart sounds.   Pulmonary/Chest: Effort normal and breath sounds normal. No respiratory distress. He exhibits no tenderness.  Abdominal: Soft. Bowel sounds are normal. There is tenderness. There is no rebound, no guarding, no tenderness at McBurney's point and negative Murphy's sign.  Patient has incision from previous surgery at the epigastrium. Incision appears noninfectious with no surrounding  erythema. Not TTP.  No evidence of a hernia visualized or palpated.   Musculoskeletal: Normal range of motion.  Neurological: He is alert and oriented to person, place, and time.  Skin: Skin is warm. Capillary refill takes less than 2 seconds.  Psychiatric: He has a normal mood and affect. His behavior is normal.  Nursing note and vitals reviewed.    ED Treatments / Results  Labs (all labs ordered are listed, but only abnormal results are displayed) Labs Reviewed  COMPREHENSIVE METABOLIC PANEL - Abnormal; Notable for the following:       Result Value   ALT 15 (*)    Total Bilirubin  1.6 (*)    All other components within normal limits  CBC - Abnormal; Notable for the following:    WBC 11.9 (*)    RBC 6.14 (*)    MCV 73.8 (*)    MCH 24.1 (*)    RDW 18.1 (*)    All other components within normal limits  URINALYSIS, ROUTINE W REFLEX MICROSCOPIC - Abnormal; Notable for the following:    Color, Urine AMBER (*)    APPearance HAZY (*)    Specific Gravity, Urine 1.033 (*)    Bilirubin Urine SMALL (*)    Ketones, ur 20 (*)    Protein, ur 100 (*)    Squamous Epithelial / LPF 0-5 (*)    All other components within normal limits  LIPASE, BLOOD    EKG  EKG Interpretation None       Radiology No results found.  Procedures Procedures (including critical care time)  Medications Ordered in ED Medications  gi cocktail (Maalox,Lidocaine,Donnatal) (30 mLs Oral Given 07/30/16 1336)     Initial Impression / Assessment and Plan / ED Course  I have reviewed the triage vital signs and the nursing notes.  Pertinent labs & imaging results that were available during my care of the patient were reviewed by me and considered in my medical decision making (see chart for details).  Clinical Course   Patient is a 55 year old male presenting with upper periumbilical abdominal pain since yesterday. He states this pain radiated to the back and shoulder blades to me, and no other radiation to me. On exam patient is afebrile, vital signs stable, no apparent distress. Abdomen soft and mildly tender, with no guarding or rebound tenderness at the epigastric region. Negative Murphy sign no focal tenderness at McBurney's point. No tracheal deviation, no JVD or new murmur, RRR, breath sounds equal bilaterally. Lab work is similar to previous findings. Patient states that he feels much better after treatment in ED. Patient states his pain is now relieved. Patient is requesting discharge at this time. Due to his history, presentation and reaction to treatment patient will be discharged with PPI  and follow up with primary. Low suspicion for appendicitis, biliary pathology, or hernia at this time. Patient given strict instructions to follow up with primary care physician and to discontinue use of NSAIDs immediately. Patient is in no acute distress. Vital Signs are stable. Patient is able to ambulate. Patient able to tolerate PO.     Final Clinical Impressions(s) / ED Diagnoses   Final diagnoses:  Epigastric pain    New Prescriptions Discharge Medication List as of 07/30/2016  3:09 PM       [address] GanadoEspina, [place] 07/30/16 1640    Mancel BaleElliott Wentz, MD 07/31/16 2010

## 2016-07-30 NOTE — ED Notes (Signed)
Bed: [id number] Expected date:  Expected time:  Means of arrival:  Comments: 55 yo abd pain

## 2016-07-30 NOTE — ED Notes (Signed)
Patient is A & O x4.  He understood discharge instructions 

## 2016-07-30 NOTE — ED Notes (Signed)
Verlon AuLeslie primary nurse verbalizes unsuccessful IV to right hand. This write unsuccessful IV to left AC. [facility].

## 2016-08-07 ENCOUNTER — Encounter (HOSPITAL_COMMUNITY): Payer: Self-pay | Admitting: Oncology

## 2016-08-07 DIAGNOSIS — F17200 Nicotine dependence, unspecified, uncomplicated: Secondary | ICD-10-CM | POA: Diagnosis not present

## 2016-08-07 DIAGNOSIS — K2900 Acute gastritis without bleeding: Secondary | ICD-10-CM | POA: Insufficient documentation

## 2016-08-07 DIAGNOSIS — Z79899 Other long term (current) drug therapy: Secondary | ICD-10-CM | POA: Diagnosis not present

## 2016-08-07 DIAGNOSIS — Z91010 Allergy to peanuts: Secondary | ICD-10-CM | POA: Insufficient documentation

## 2016-08-07 DIAGNOSIS — I10 Essential (primary) hypertension: Secondary | ICD-10-CM | POA: Insufficient documentation

## 2016-08-07 DIAGNOSIS — R1084 Generalized abdominal pain: Secondary | ICD-10-CM | POA: Diagnosis present

## 2016-08-07 NOTE — ED Notes (Signed)
Patient very belligerent when called for lab draw. Patient asking "why this an emergency room when I can't be seen. I want to go to a room. I am hurting all over." Patient then tried to vomit on the floor. Patient was given vomit bag and instructed to use it.

## 2016-08-07 NOTE — ED Triage Notes (Signed)
Pt c/o generalized abdominal pain.  +nausea/vomiting that started last night.  Pt seen here yesterday for the same.  Pt states he can't keep anything down so he represented tonight.

## 2016-08-08 ENCOUNTER — Encounter (HOSPITAL_COMMUNITY): Payer: Self-pay | Admitting: Emergency Medicine

## 2016-08-08 ENCOUNTER — Emergency Department (HOSPITAL_COMMUNITY)
Admission: EM | Admit: 2016-08-08 | Discharge: 2016-08-08 | Disposition: A | Discharge status: Home / Self Care | Payer: Medicaid Other | Attending: Emergency Medicine | Admitting: Emergency Medicine

## 2016-08-08 DIAGNOSIS — K2970 Gastritis, unspecified, without bleeding: Secondary | ICD-10-CM | POA: Diagnosis present

## 2016-08-08 DIAGNOSIS — K920 Hematemesis: Secondary | ICD-10-CM | POA: Diagnosis present

## 2016-08-08 DIAGNOSIS — K210 Gastro-esophageal reflux disease with esophagitis: Principal | ICD-10-CM | POA: Diagnosis present

## 2016-08-08 DIAGNOSIS — K2980 Duodenitis without bleeding: Secondary | ICD-10-CM | POA: Diagnosis present

## 2016-08-08 DIAGNOSIS — K259 Gastric ulcer, unspecified as acute or chronic, without hemorrhage or perforation: Secondary | ICD-10-CM | POA: Diagnosis present

## 2016-08-08 DIAGNOSIS — K2900 Acute gastritis without bleeding: Secondary | ICD-10-CM

## 2016-08-08 DIAGNOSIS — D72829 Elevated white blood cell count, unspecified: Secondary | ICD-10-CM | POA: Diagnosis present

## 2016-08-08 DIAGNOSIS — Z9119 Patient's noncompliance with other medical treatment and regimen: Secondary | ICD-10-CM

## 2016-08-08 DIAGNOSIS — D62 Acute posthemorrhagic anemia: Secondary | ICD-10-CM | POA: Diagnosis present

## 2016-08-08 DIAGNOSIS — F17210 Nicotine dependence, cigarettes, uncomplicated: Secondary | ICD-10-CM | POA: Diagnosis present

## 2016-08-08 DIAGNOSIS — F1010 Alcohol abuse, uncomplicated: Secondary | ICD-10-CM | POA: Diagnosis present

## 2016-08-08 LAB — COMPREHENSIVE METABOLIC PANEL
ALK PHOS: 63 U/L (ref 38–126)
ALT: 18 U/L (ref 17–63)
ANION GAP: 7 (ref 5–15)
AST: 21 U/L (ref 15–41)
Albumin: 4 g/dL (ref 3.5–5.0)
BILIRUBIN TOTAL: 0.7 mg/dL (ref 0.3–1.2)
BUN: 19 mg/dL (ref 6–20)
CALCIUM: 9.2 mg/dL (ref 8.9–10.3)
CO2: 28 mmol/L (ref 22–32)
CREATININE: 1.03 mg/dL (ref 0.61–1.24)
Chloride: 106 mmol/L (ref 101–111)
Glucose, Bld: 108 mg/dL — ABNORMAL HIGH (ref 65–99)
Potassium: 4 mmol/L (ref 3.5–5.1)
SODIUM: 141 mmol/L (ref 135–145)
TOTAL PROTEIN: 7.5 g/dL (ref 6.5–8.1)

## 2016-08-08 LAB — URINALYSIS, MICROSCOPIC (REFLEX): Bacteria, UA: NONE SEEN

## 2016-08-08 LAB — CBC
HCT: 39.4 % (ref 39.0–52.0)
Hemoglobin: 12.6 g/dL — ABNORMAL LOW (ref 13.0–17.0)
MCH: 23.9 pg — AB (ref 26.0–34.0)
MCHC: 32 g/dL (ref 30.0–36.0)
MCV: 74.8 fL — ABNORMAL LOW (ref 78.0–100.0)
PLATELETS: 176 10*3/uL (ref 150–400)
RBC: 5.27 MIL/uL (ref 4.22–5.81)
RDW: 18.6 % — ABNORMAL HIGH (ref 11.5–15.5)
WBC: 17.1 10*3/uL — ABNORMAL HIGH (ref 4.0–10.5)

## 2016-08-08 LAB — URINALYSIS, ROUTINE W REFLEX MICROSCOPIC
BILIRUBIN URINE: NEGATIVE
Glucose, UA: NEGATIVE mg/dL
HGB URINE DIPSTICK: NEGATIVE
Ketones, ur: 15 mg/dL — AB
Leukocytes, UA: NEGATIVE
NITRITE: NEGATIVE
PROTEIN: 30 mg/dL — AB
Specific Gravity, Urine: 1.025 (ref 1.005–1.030)
pH: 6 (ref 5.0–8.0)

## 2016-08-08 LAB — LIPASE, BLOOD: Lipase: 27 U/L (ref 11–51)

## 2016-08-08 MED ORDER — ONDANSETRON 4 MG PO TBDP
4.0000 mg | ORAL_TABLET | Freq: Once | ORAL | Status: AC | PRN
  Administered 2016-08-08: 4 mg via ORAL
  Filled 2016-08-08: qty 1

## 2016-08-08 MED ORDER — GI COCKTAIL ~~LOC~~
30.0000 mL | Freq: Once | ORAL | Status: AC
  Administered 2016-08-08: 30 mL via ORAL
  Filled 2016-08-08: qty 30

## 2016-08-08 NOTE — ED Notes (Signed)
Pt mad about having to wait for a room. Cussing and mad walking back to the room.

## 2016-08-08 NOTE — Discharge Instructions (Signed)
Try to avoid alcohol, spice foods, fatty foods for the next several days make sure to take your home medications as direted

## 2016-08-08 NOTE — ED Provider Notes (Signed)
[facility]-EMERGENCY DEPT Provider Note   CSN: [id number] Arrival date & time: 08/07/16  2229 By signing my name below, I, [name], attest that this documentation has been prepared under the direction and in the presence of non-physician practitioner, Earley FavorGail Andres Escandon, NP. Electronically Signed: Levon HedgerElizabeth Frazier, Scribe. 08/08/2016. 1:59 AM.   History   Chief Complaint Chief Complaint  Patient presents with  . Abdominal Pain   HPI [name] is a 56 y.o. male with a hx of substance abuse, alcohol abuse, GI bleed and GERD who presents to the Emergency Department complaining of constant, moderate generalized abdominal pain that radiates to his back onset yesterday. Pt notes associated nausea and vomiting.  No alleviating or modifying factors noted.  No treatments tried PTA. Pt states he drank water to "flush out the alcohol". Pt was seen here for a similar complaint on 07/30/16 and did not fill his prescriptions or follow with a PCP. He denies any diarrhea or any other associated symptoms.   The history is provided by the patient. No language interpreter was used.   Past Medical History:  Diagnosis Date  . Abdominal pain, other specified site   . Abnormal CT scan, esophagus 03/2012  . GERD (gastroesophageal reflux disease)   . Substance abuse 03/2012   tox screen positive for cocaine.     Patient Active Problem List   Diagnosis Date Noted  . History of alcohol abuse   . 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 Duodenitis 04/02/2016  . GI bleed 04/02/2016  . Personality disorder 10/20/2014  . Acute upper GI bleed   . Ileus, postoperative (HCC) 10/15/2014  . Aspiration pneumonia (HCC) 10/14/2014  . Polysubstance abuse 10/09/2014  . UGIB (upper gastrointestinal bleed) 10/08/2014  . Leukocytosis 04/30/2013  . Microcytic anemia 04/30/2013  . Hematemesis 08/10/2012  . Anemia due to GI blood loss 08/10/2012  . Acute respiratory failure (HCC) 08/10/2012  . Hypertension 08/10/2012  . Duodenal  ulcer with hemorrhage s/p oversew 10/11/2014 08/10/2012  . Encephalopathy acute 08/10/2012    Past Surgical History:  Procedure Laterality Date  . ESOPHAGOGASTRODUODENOSCOPY  08/10/2012   Procedure: ESOPHAGOGASTRODUODENOSCOPY (EGD);  Surgeon: Rachael Feeaniel P Jacobs, MD;  [facility];  Service: Endoscopy;  Laterality: N/A;  will be done in room 1   . ESOPHAGOGASTRODUODENOSCOPY Left 05/01/2013   Procedure: ESOPHAGOGASTRODUODENOSCOPY (EGD);  Surgeon: Charna ElizabethJyothi Mann, MD;  Location: [facility];  Service: Endoscopy;  Laterality: Left;  . ESOPHAGOGASTRODUODENOSCOPY N/A 05/11/2013   Procedure: ESOPHAGOGASTRODUODENOSCOPY (EGD);  Surgeon: Graylin ShiverSalem F Ganem, MD;  [facility];  Service: Endoscopy;  Laterality: N/A;  . ESOPHAGOGASTRODUODENOSCOPY N/A 10/06/2014   Procedure: ESOPHAGOGASTRODUODENOSCOPY (EGD);  Surgeon: Beverley FiedlerJay M Pyrtle, MD;  [facility];  Service: Endoscopy;  Laterality: N/A;  . ESOPHAGOGASTRODUODENOSCOPY N/A 10/09/2014   Procedure: ESOPHAGOGASTRODUODENOSCOPY (EGD);  Surgeon: Beverley FiedlerJay M Pyrtle, MD;  [facility];  Service: Gastroenterology;  Laterality: N/A;  Bedside  . ESOPHAGOGASTRODUODENOSCOPY N/A 04/03/2016   Procedure: ESOPHAGOGASTRODUODENOSCOPY (EGD);  Surgeon: Napoleon FormKavitha V Nandigam, MD;  Location: [facility];  Service: Gastroenterology;  Laterality: N/A;  . LAPAROTOMY  08/10/2012   Procedure: EXPLORATORY LAPAROTOMY;  Surgeon: Cherylynn RidgesJames O Wyatt, MD;  [facility];  Service: General;  Laterality: N/A;  . LAPAROTOMY N/A 10/10/2014   Procedure: EXPLORATORY LAPAROTOMY WITH GASTROPATHY AND OVER SEWEN DUODENAL ULCER REGION ;  Surgeon: Luretha MurphyMatthew Martin, MD;  Location: [facility] ORS;  Service: General;  Laterality: N/A;  . undescended testicle     on right. noted on 03/2012 CT scan.  no surgery referral.  Home Medications    Prior to Admission medications   Medication Sig Start Date End Date Taking? Authorizing Provider  acetaminophen (TYLENOL) 500 MG tablet Take 500 mg by mouth every  6 (six) hours as needed.   Yes Historical Provider, MD  omeprazole (PRILOSEC) 20 MG capsule Take 1 capsule (20 mg total) by mouth daily. 07/30/16  Yes Francisco [address] [place], [place]  bismuth-metronidazole-tetracycline [facility]) [phone] MG capsule Take 3 capsules by mouth 4 (four) times daily -  before meals and at bedtime. Patient not taking: Reported on 08/08/2016 04/11/16   [name], MD  folic acid (FOLVITE) 1 MG tablet Take 1 tablet (1 mg total) by mouth daily. Patient not taking: Reported on 07/30/2016 04/06/16   [name], DO  Multiple Vitamins-Minerals (MULTIVITAMIN) tablet Take 1 tablet by mouth daily. Patient not taking: Reported on 08/08/2016 04/06/16   [name], DO  pantoprazole (PROTONIX) 40 MG tablet Take 1 tablet (40 mg total) by mouth daily. Patient not taking: Reported on 08/08/2016 04/06/16   [name], DO  thiamine (VITAMIN B-1) 100 MG tablet Take 1 tablet (100 mg total) by mouth daily. Patient not taking: Reported on 07/30/2016 04/06/16   [name], DO    Family History No family history on file.  Social History Social History  Substance Use Topics  . Smoking status: Light Tobacco Smoker    Packs/day: 1.00    Years: 34.00  . Smokeless tobacco: Never Used  . Alcohol use Yes     Comment: 40oz beer/day     Allergies   Nsaids; Chocolate; Peanut-containing drug products; and Spinach   Review of Systems Review of Systems  Constitutional: Negative for fever.  Gastrointestinal: Positive for abdominal pain, nausea and vomiting. Negative for diarrhea.  Musculoskeletal: Positive for back pain.  All other systems reviewed and are negative.  Physical Exam Updated Vital Signs BP (!) 158/102 (BP Location: Right Arm)   Pulse 77   Temp 98.3 F (36.8 C) (Oral)   Resp 18   SpO2 100%   Physical Exam  ED Treatments / Results  DIAGNOSTIC STUDIES:  Oxygen Saturation is 96% on RA, adequate by my interpretation.    COORDINATION OF CARE:  1:56 AM  Discussed treatment plan with pt at bedside and pt agreed to plan.   Labs (all labs ordered are listed, but only abnormal results are displayed) Labs Reviewed  COMPREHENSIVE METABOLIC PANEL - Abnormal; Notable for the following:       Result Value   Glucose, Bld 108 (*)    All other components within normal limits  CBC - Abnormal; Notable for the following:    WBC 17.1 (*)    Hemoglobin 12.6 (*)    MCV 74.8 (*)    MCH 23.9 (*)    RDW 18.6 (*)    All other components within normal limits  URINALYSIS, ROUTINE W REFLEX MICROSCOPIC - Abnormal; Notable for the following:    Ketones, ur 15 (*)    Protein, ur 30 (*)    All other components within normal limits  URINALYSIS, MICROSCOPIC (REFLEX) - Abnormal; Notable for the following:    Squamous Epithelial / LPF 0-5 (*)    All other components within normal limits  LIPASE, BLOOD    EKG  EKG Interpretation None       Radiology No results found.  Procedures Procedures (including critical care time)  Medications Ordered in ED Medications  gi cocktail (Maalox,Lidocaine,Donnatal) (30 mLs Oral Given 08/08/16 0324)     Initial  Impression / Assessment and Plan / ED Course  I have reviewed the triage vital signs and the nursing notes.  Pertinent labs & imaging results that were available during my care of the patient were reviewed by me and considered in my medical decision making (see chart for details).  Clinical Course    Labs show  an elevated white count of 17.1.  The rest of his labs remain within normal parameters most likely stress related as there is no temperature elevation or tacchycardia Given GI Cocktail with resolution of his pain and has been sleeping since administration   Final Clinical Impressions(s) / ED Diagnoses   Final diagnoses:  Acute gastritis without hemorrhage, unspecified gastritis type    New Prescriptions Discharge Medication List as of 08/08/2016  5:57 AM    I personally performed the services  described in this documentation, which was scribed in my presence. The recorded information has been reviewed and is accurate.   Earley Favor, NP 08/08/16 1950    Gilda Crease, MD 08/09/16 [phone]

## 2016-08-08 NOTE — ED Triage Notes (Signed)
Pt reports having abd pain with nausea and vomiting since 08/07/16 and was seen on 08/08/16 for symptoms. Pt transported in by EMS tonight. Pt stated that he was unable to keep anything down.

## 2016-08-08 NOTE — ED Provider Notes (Signed)
[facility]-EMERGENCY DEPT Provider Note   CSN: [id number] Arrival date & time: 08/07/16  2229     History   Chief Complaint Chief Complaint  Patient presents with  . Abdominal Pain    HPI [name] is a 56 y.o. male.  HPI  Past Medical History:  Diagnosis Date  . Abdominal pain, other specified site   . Abnormal CT scan, esophagus 03/2012  . GERD (gastroesophageal reflux disease)   . Substance abuse 03/2012   tox screen positive for cocaine.     Patient Active Problem List   Diagnosis Date Noted  . History of alcohol abuse   . 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 Duodenitis 04/02/2016  . GI bleed 04/02/2016  . Personality disorder 10/20/2014  . Acute upper GI bleed   . Ileus, postoperative (HCC) 10/15/2014  . Aspiration pneumonia (HCC) 10/14/2014  . Polysubstance abuse 10/09/2014  . UGIB (upper gastrointestinal bleed) 10/08/2014  . Leukocytosis 04/30/2013  . Microcytic anemia 04/30/2013  . Hematemesis 08/10/2012  . Anemia due to GI blood loss 08/10/2012  . Acute respiratory failure (HCC) 08/10/2012  . Hypertension 08/10/2012  . Duodenal ulcer with hemorrhage s/p oversew 10/11/2014 08/10/2012  . Encephalopathy acute 08/10/2012    Past Surgical History:  Procedure Laterality Date  . ESOPHAGOGASTRODUODENOSCOPY  08/10/2012   Procedure: ESOPHAGOGASTRODUODENOSCOPY (EGD);  Surgeon: Rachael Feeaniel P Jacobs, MD;  [facility];  Service: Endoscopy;  Laterality: N/A;  will be done in room 1   . ESOPHAGOGASTRODUODENOSCOPY Left 05/01/2013   Procedure: ESOPHAGOGASTRODUODENOSCOPY (EGD);  Surgeon: Charna ElizabethJyothi Mann, MD;  Location: [facility];  Service: Endoscopy;  Laterality: Left;  . ESOPHAGOGASTRODUODENOSCOPY N/A 05/11/2013   Procedure: ESOPHAGOGASTRODUODENOSCOPY (EGD);  Surgeon: Graylin ShiverSalem F Ganem, MD;  [facility];  Service: Endoscopy;  Laterality: N/A;  . ESOPHAGOGASTRODUODENOSCOPY N/A 10/06/2014   Procedure: ESOPHAGOGASTRODUODENOSCOPY (EGD);  Surgeon: Beverley FiedlerJay M Pyrtle, MD;   [facility];  Service: Endoscopy;  Laterality: N/A;  . ESOPHAGOGASTRODUODENOSCOPY N/A 10/09/2014   Procedure: ESOPHAGOGASTRODUODENOSCOPY (EGD);  Surgeon: Beverley FiedlerJay M Pyrtle, MD;  [facility];  Service: Gastroenterology;  Laterality: N/A;  Bedside  . ESOPHAGOGASTRODUODENOSCOPY N/A 04/03/2016   Procedure: ESOPHAGOGASTRODUODENOSCOPY (EGD);  Surgeon: Napoleon FormKavitha V Nandigam, MD;  Location: [facility];  Service: Gastroenterology;  Laterality: N/A;  . LAPAROTOMY  08/10/2012   Procedure: EXPLORATORY LAPAROTOMY;  Surgeon: Cherylynn RidgesJames O Wyatt, MD;  [facility];  Service: General;  Laterality: N/A;  . LAPAROTOMY N/A 10/10/2014   Procedure: EXPLORATORY LAPAROTOMY WITH GASTROPATHY AND OVER SEWEN DUODENAL ULCER REGION ;  Surgeon: Luretha MurphyMatthew Martin, MD;  Location: [facility];  Service: General;  Laterality: N/A;  . undescended testicle     on right. noted on 03/2012 CT scan.  no surgery referral.        Home Medications    Prior to Admission medications   Medication Sig Start Date End Date Taking? Authorizing Provider  acetaminophen (TYLENOL) 500 MG tablet Take 500 mg by mouth every 6 (six) hours as needed.   Yes Historical Provider, MD  omeprazole (PRILOSEC) 20 MG capsule Take 1 capsule (20 mg total) by mouth daily. 07/30/16  Yes Francisco [address], [place]  bismuth-metronidazole-tetracycline [facility] [phone] MG capsule Take 3 capsules by mouth 4 (four) times daily -  before meals and at bedtime. Patient not taking: Reported on 08/08/2016 04/11/16   Napoleon FormKavitha V Nandigam, MD  folic acid (FOLVITE) 1 MG tablet Take 1 tablet (1 mg total) by mouth daily. Patient not taking: Reported on 07/30/2016 04/06/16   Edsel PetrinMaryann Mikhail, DO  Multiple Vitamins-Minerals (  MULTIVITAMIN) tablet Take 1 tablet by mouth daily. Patient not taking: Reported on 08/08/2016 04/06/16   [name], DO  pantoprazole (PROTONIX) 40 MG tablet Take 1 tablet (40 mg total) by mouth daily. Patient not taking: Reported on 08/08/2016 04/06/16   Nita Sells  Mikhail, DO  thiamine (VITAMIN B-1) 100 MG tablet Take 1 tablet (100 mg total) by mouth daily. Patient not taking: Reported on 07/30/2016 04/06/16   [name], DO    Family History No family history on file.  Social History Social History  Substance Use Topics  . Smoking status: Light Tobacco Smoker    Packs/day: 1.00    Years: 34.00  . Smokeless tobacco: Never Used  . Alcohol use Yes     Comment: 40oz beer/day     Allergies   Nsaids; Chocolate; Peanut-containing drug products; and Spinach   Review of Systems Review of Systems   Physical Exam Updated Vital Signs BP (!) 158/102 (BP Location: Right Arm)   Pulse 77   Temp 98.3 F (36.8 C) (Oral)   Resp 18   SpO2 100%   Physical Exam   ED Treatments / Results  Labs (all labs ordered are listed, but only abnormal results are displayed) Labs Reviewed  COMPREHENSIVE METABOLIC PANEL - Abnormal; Notable for the following:       Result Value   Glucose, Bld 108 (*)    All other components within normal limits  CBC - Abnormal; Notable for the following:    WBC 17.1 (*)    Hemoglobin 12.6 (*)    MCV 74.8 (*)    MCH 23.9 (*)    RDW 18.6 (*)    All other components within normal limits  URINALYSIS, ROUTINE W REFLEX MICROSCOPIC - Abnormal; Notable for the following:    Ketones, ur 15 (*)    Protein, ur 30 (*)    All other components within normal limits  URINALYSIS, MICROSCOPIC (REFLEX) - Abnormal; Notable for the following:    Squamous Epithelial / LPF 0-5 (*)    All other components within normal limits  LIPASE, BLOOD    EKG  EKG Interpretation None       Radiology No results found.  Procedures Procedures (including critical care time)  Medications Ordered in ED Medications  gi cocktail (Maalox,Lidocaine,Donnatal) (30 mLs Oral Given 08/08/16 0324)     Initial Impression / Assessment and Plan / ED Course  I have reviewed the triage vital signs and the nursing notes.  Pertinent labs & imaging  results that were available during my care of the patient were reviewed by me and considered in my medical decision making (see chart for details).  Clinical Course      Given GI Cocktail with resolution of pain   Final Clinical Impressions(s) / ED Diagnoses   Final diagnoses:  Acute gastritis without hemorrhage, unspecified gastritis type    New Prescriptions Discharge Medication List as of 08/08/2016  5:57 AM       Earley Favor, NP 08/08/16 1949    Gilda Crease, MD 08/09/16 [phone]

## 2016-08-09 ENCOUNTER — Inpatient Hospital Stay (HOSPITAL_COMMUNITY)
Admission: EM | Admit: 2016-08-09 | Discharge: 2016-08-11 | DRG: 392 | Disposition: A | Discharge status: Home / Self Care | Payer: Medicaid Other | Attending: Internal Medicine | Admitting: Internal Medicine

## 2016-08-09 ENCOUNTER — Emergency Department (HOSPITAL_COMMUNITY): Payer: Medicaid Other

## 2016-08-09 DIAGNOSIS — F1010 Alcohol abuse, uncomplicated: Secondary | ICD-10-CM | POA: Diagnosis not present

## 2016-08-09 DIAGNOSIS — K259 Gastric ulcer, unspecified as acute or chronic, without hemorrhage or perforation: Secondary | ICD-10-CM

## 2016-08-09 DIAGNOSIS — K920 Hematemesis: Secondary | ICD-10-CM | POA: Diagnosis present

## 2016-08-09 DIAGNOSIS — K209 Esophagitis, unspecified without bleeding: Secondary | ICD-10-CM

## 2016-08-09 DIAGNOSIS — K2970 Gastritis, unspecified, without bleeding: Secondary | ICD-10-CM | POA: Diagnosis not present

## 2016-08-09 DIAGNOSIS — K922 Gastrointestinal hemorrhage, unspecified: Secondary | ICD-10-CM | POA: Diagnosis present

## 2016-08-09 DIAGNOSIS — K2990 Gastroduodenitis, unspecified, without bleeding: Secondary | ICD-10-CM

## 2016-08-09 DIAGNOSIS — R112 Nausea with vomiting, unspecified: Secondary | ICD-10-CM

## 2016-08-09 LAB — DIFFERENTIAL
BASOS ABS: 0 10*3/uL (ref 0.0–0.1)
Basophils Relative: 0 %
Eosinophils Absolute: 0.1 10*3/uL (ref 0.0–0.7)
Eosinophils Relative: 1 %
LYMPHS ABS: 1.8 10*3/uL (ref 0.7–4.0)
LYMPHS PCT: 17 %
MONOS PCT: 12 %
Monocytes Absolute: 1.2 10*3/uL — ABNORMAL HIGH (ref 0.1–1.0)
NEUTROS ABS: 7.2 10*3/uL (ref 1.7–7.7)
Neutrophils Relative %: 70 %

## 2016-08-09 LAB — COMPREHENSIVE METABOLIC PANEL
ALBUMIN: 3.2 g/dL — AB (ref 3.5–5.0)
ALK PHOS: 57 U/L (ref 38–126)
ALT: 12 U/L — ABNORMAL LOW (ref 17–63)
ALT: 17 U/L (ref 17–63)
ANION GAP: 4 — AB (ref 5–15)
ANION GAP: 9 (ref 5–15)
AST: 14 U/L — ABNORMAL LOW (ref 15–41)
AST: 17 U/L (ref 15–41)
Albumin: 3.9 g/dL (ref 3.5–5.0)
Alkaline Phosphatase: 47 U/L (ref 38–126)
BILIRUBIN TOTAL: 0.4 mg/dL (ref 0.3–1.2)
BUN: 22 mg/dL — ABNORMAL HIGH (ref 6–20)
BUN: 24 mg/dL — ABNORMAL HIGH (ref 6–20)
CALCIUM: 9 mg/dL (ref 8.9–10.3)
CHLORIDE: 104 mmol/L (ref 101–111)
CO2: 30 mmol/L (ref 22–32)
CO2: 32 mmol/L (ref 22–32)
Calcium: 8.1 mg/dL — ABNORMAL LOW (ref 8.9–10.3)
Chloride: 104 mmol/L (ref 101–111)
Creatinine, Ser: 0.99 mg/dL (ref 0.61–1.24)
Creatinine, Ser: 1.16 mg/dL (ref 0.61–1.24)
GFR calc non Af Amer: >60 mL/min (ref >60)
Glucose, Bld: 108 mg/dL — ABNORMAL HIGH (ref 65–99)
Glucose, Bld: 138 mg/dL — ABNORMAL HIGH (ref 65–99)
Potassium: 3.7 mmol/L (ref 3.5–5.1)
Potassium: 3.7 mmol/L (ref 3.5–5.1)
Sodium: 140 mmol/L (ref 135–145)
Sodium: 143 mmol/L (ref 135–145)
TOTAL PROTEIN: 7.5 g/dL (ref 6.5–8.1)
Total Bilirubin: 0.4 mg/dL (ref 0.3–1.2)
Total Protein: 5.7 g/dL — ABNORMAL LOW (ref 6.5–8.1)

## 2016-08-09 LAB — RAPID URINE DRUG SCREEN, HOSP PERFORMED
Amphetamines: NOT DETECTED
BARBITURATES: POSITIVE — AB
Benzodiazepines: NOT DETECTED
COCAINE: POSITIVE — AB
OPIATES: NOT DETECTED
TETRAHYDROCANNABINOL: NOT DETECTED

## 2016-08-09 LAB — URINALYSIS, ROUTINE W REFLEX MICROSCOPIC
Bacteria, UA: NONE SEEN
Bilirubin Urine: NEGATIVE
GLUCOSE, UA: NEGATIVE mg/dL
HGB URINE DIPSTICK: NEGATIVE
Ketones, ur: 5 mg/dL — AB
LEUKOCYTES UA: NEGATIVE
NITRITE: NEGATIVE
PH: 5 (ref 5.0–8.0)
Protein, ur: 30 mg/dL — AB
SPECIFIC GRAVITY, URINE: 1.033 — AB (ref 1.005–1.030)

## 2016-08-09 LAB — CBC
HCT: 33.2 % — ABNORMAL LOW (ref 39.0–52.0)
HEMATOCRIT: 39.7 % (ref 39.0–52.0)
HEMOGLOBIN: 12.9 g/dL — AB (ref 13.0–17.0)
Hemoglobin: 10.3 g/dL — ABNORMAL LOW (ref 13.0–17.0)
MCH: 23.4 pg — ABNORMAL LOW (ref 26.0–34.0)
MCH: 24.3 pg — AB (ref 26.0–34.0)
MCHC: 31 g/dL (ref 30.0–36.0)
MCHC: 32.5 g/dL (ref 30.0–36.0)
MCV: 74.9 fL — ABNORMAL LOW (ref 78.0–100.0)
MCV: 75.3 fL — ABNORMAL LOW (ref 78.0–100.0)
PLATELETS: 161 10*3/uL (ref 150–400)
Platelets: 208 10*3/uL (ref 150–400)
RBC: 4.41 MIL/uL (ref 4.22–5.81)
RBC: 5.3 MIL/uL (ref 4.22–5.81)
RDW: 18.2 % — ABNORMAL HIGH (ref 11.5–15.5)
RDW: 18.3 % — ABNORMAL HIGH (ref 11.5–15.5)
WBC: 10.4 10*3/uL (ref 4.0–10.5)
WBC: 12.3 10*3/uL — ABNORMAL HIGH (ref 4.0–10.5)

## 2016-08-09 LAB — HEMOGLOBIN AND HEMATOCRIT, BLOOD
HCT: 32.1 % — ABNORMAL LOW (ref 39.0–52.0)
HEMOGLOBIN: 10.2 g/dL — AB (ref 13.0–17.0)

## 2016-08-09 LAB — LIPASE, BLOOD: Lipase: 22 U/L (ref 11–51)

## 2016-08-09 LAB — TROPONIN I

## 2016-08-09 MED ORDER — THIAMINE HCL 100 MG/ML IJ SOLN: 100.0000 mg | Freq: Every day | INTRAMUSCULAR | Status: DC

## 2016-08-09 MED ORDER — PANTOPRAZOLE SODIUM 40 MG IV SOLR
40.0000 mg | Freq: Once | INTRAVENOUS | Status: AC
  Administered 2016-08-09: 40 mg via INTRAVENOUS
  Filled 2016-08-09: qty 40

## 2016-08-09 MED ORDER — ONDANSETRON HCL 4 MG PO TABS
4.0000 mg | ORAL_TABLET | Freq: Four times a day (QID) | ORAL | Status: DC | PRN
Start: 2016-08-09 — End: 2016-08-11

## 2016-08-09 MED ORDER — ONDANSETRON HCL 4 MG/2ML IJ SOLN
4.0000 mg | Freq: Once | INTRAMUSCULAR | Status: AC
Start: 2016-08-09 — End: 2016-08-09
  Administered 2016-08-09: 4 mg via INTRAVENOUS
  Filled 2016-08-09: qty 2

## 2016-08-09 MED ORDER — SODIUM CHLORIDE 0.9 % IV SOLN: INTRAVENOUS | Status: DC

## 2016-08-09 MED ORDER — ADULT MULTIVITAMIN W/MINERALS CH
1.0000 | ORAL_TABLET | Freq: Every day | ORAL | Status: DC
  Administered 2016-08-10 – 2016-08-11 (×2): 1 via ORAL
  Filled 2016-08-09 (×2): qty 1

## 2016-08-09 MED ORDER — OMEPRAZOLE 40 MG PO CPDR: 40.0000 mg | DELAYED_RELEASE_CAPSULE | Freq: Every day | ORAL | 0 refills | Status: DC

## 2016-08-09 MED ORDER — ACETAMINOPHEN 650 MG RE SUPP: 650.0000 mg | Freq: Four times a day (QID) | RECTAL | Status: DC | PRN

## 2016-08-09 MED ORDER — PANTOPRAZOLE SODIUM 40 MG IV SOLR
40.0000 mg | Freq: Two times a day (BID) | INTRAVENOUS | Status: DC
  Administered 2016-08-09: 40 mg via INTRAVENOUS

## 2016-08-09 MED ORDER — PANTOPRAZOLE SODIUM 40 MG IV SOLR
40.0000 mg | Freq: Two times a day (BID) | INTRAVENOUS | Status: DC
  Filled 2016-08-09: qty 40

## 2016-08-09 MED ORDER — GI COCKTAIL ~~LOC~~
30.0000 mL | Freq: Once | ORAL | Status: AC
Start: 2016-08-09 — End: 2016-08-09
  Administered 2016-08-09: 30 mL via ORAL
  Filled 2016-08-09: qty 30

## 2016-08-09 MED ORDER — ONDANSETRON 4 MG PO TBDP: 4.0000 mg | ORAL_TABLET | Freq: Three times a day (TID) | ORAL | 0 refills | Status: AC | PRN

## 2016-08-09 MED ORDER — POTASSIUM CHLORIDE IN NACL 20-0.9 MEQ/L-% IV SOLN
INTRAVENOUS | Status: AC
  Administered 2016-08-09 – 2016-08-10 (×2): via INTRAVENOUS
  Filled 2016-08-09 (×3): qty 1000

## 2016-08-09 MED ORDER — ONDANSETRON HCL 4 MG/2ML IJ SOLN: 4.0000 mg | Freq: Four times a day (QID) | INTRAMUSCULAR | Status: DC | PRN

## 2016-08-09 MED ORDER — ONDANSETRON 4 MG PO TBDP
4.0000 mg | ORAL_TABLET | Freq: Once | ORAL | Status: AC
  Administered 2016-08-09: 4 mg via ORAL
  Filled 2016-08-09: qty 1

## 2016-08-09 MED ORDER — SODIUM CHLORIDE 0.9 % IV BOLUS (SEPSIS)
1000.0000 mL | Freq: Once | INTRAVENOUS | Status: AC
  Administered 2016-08-09: 1000 mL via INTRAVENOUS

## 2016-08-09 MED ORDER — SUCRALFATE 1 G PO TABS
1.0000 g | ORAL_TABLET | Freq: Once | ORAL | Status: AC
  Administered 2016-08-09: 1 g via ORAL
  Filled 2016-08-09: qty 1

## 2016-08-09 MED ORDER — FOLIC ACID 1 MG PO TABS
1.0000 mg | ORAL_TABLET | Freq: Every day | ORAL | Status: DC
  Administered 2016-08-10 – 2016-08-11 (×2): 1 mg via ORAL
  Filled 2016-08-09 (×2): qty 1

## 2016-08-09 MED ORDER — VITAMIN B-1 100 MG PO TABS
100.0000 mg | ORAL_TABLET | Freq: Every day | ORAL | Status: DC
  Administered 2016-08-10 – 2016-08-11 (×2): 100 mg via ORAL
  Filled 2016-08-09 (×2): qty 1

## 2016-08-09 MED ORDER — LORAZEPAM 2 MG/ML IJ SOLN: 1.0000 mg | Freq: Four times a day (QID) | INTRAMUSCULAR | Status: DC | PRN

## 2016-08-09 MED ORDER — ACETAMINOPHEN 325 MG PO TABS: 650.0000 mg | ORAL_TABLET | Freq: Four times a day (QID) | ORAL | Status: DC | PRN

## 2016-08-09 MED ORDER — LORAZEPAM 1 MG PO TABS: 1.0000 mg | ORAL_TABLET | Freq: Four times a day (QID) | ORAL | Status: DC | PRN

## 2016-08-09 NOTE — H&P (Signed)
History and Physical  [name]:[id number] DOB: August 06, 1960 DOA: 08/09/2016   PCP: [name], MD   Patient coming from: Home  Chief Complaint: Hematemesis  HPI:  [name] is a 56 y.o. male with medical history of gastric and duodenal ulcer disease, alcohol abuse, medical noncompliance presents with hematemesis/coffee grounds that began on 08/06/2016. The patient states that he has not been taking his medications as prescribed. He states that he continues to drink beer on a daily basis, but is not forthcoming regarding the amount. His last drink was on 08/06/2016. Associated with his hematemesis, the patient has had epigastric abdominal pain. On the morning of 08/09/2016, he continued to have emesis. As a result, the patient presented to the emergency department. The patient has had an extensive history of upper GI bleed secondary to duodenal ulcers and esophagitis. He has had numerous EGDs with hemostatic therapies dating back to 2014. In October 2014, the patient INR embolization of the gastroduodenal artery. On 10/10/2014, the patient had expiratory laparotomy with gastrorrhaphy to oversew a bleeding duodenal ulcer. Most recently, the patient had an EGD on 04/03/2016 which showed Grade D esophagitis with ulceration.  Non-bleeding gastric ulcers with pigmented material and visible sutrures.  Partial GOO due to extensive peptic ulcers.  Multiple, pigmented, oozing duodenal ulcers. He was given a course of Pylera after last EGD which he reports he never took. He was given protonix 40mg  daily but states he took it only once.  He has presented to the emergency department multiple times secondary to recurrent epigastric pain and vomiting.  In the emergency department, the patient failed solid food challenge 2. As a result, admission was advised. The patient was afebrile and hemodynamically stable. BMP and hepatic panel unremarkable. He is saturating well on room air. WBC was 12.3 with  stable hemoglobin of 12.9. The patient was given IV PPI and multiple doses of Zofran.   Assessment/Plan: Hematemesis/upper GI bleed -Likely has recurrent bleeding from esophagitis and/or peptic ulcer disease -Continue Protonix IV twice a day  -Clear liquids -I have consulted [facility] -monitor Hgb -EGD planned 08/10/16 -Check coags -Urinalysis -IV fluids -PRN anti-emetics -Baseline hemoglobin 11-12  Alcohol abuse -Alcohol withdrawal protocol -Urine drug screen  Peptic ulcer disease -Plan as discussed above  Leukocytosis -Likely stress demargination -Urinalysis -Afebrile hemodynamics are stable -will not start antibiotics -Acute abdominal series negative for consolidation; nonobstructive bowel gas pattern.        Past Medical History:  Diagnosis Date  . Abdominal pain, other specified site   . Abnormal CT scan, esophagus 03/2012  . GERD (gastroesophageal reflux disease)   . Substance abuse 03/2012   tox screen positive for cocaine.    Past Surgical History:  Procedure Laterality Date  . ESOPHAGOGASTRODUODENOSCOPY  08/10/2012   Procedure: ESOPHAGOGASTRODUODENOSCOPY (EGD);  Surgeon: [name], MD;  Location: [facility] ENDOSCOPY;  Service: Endoscopy;  Laterality: N/A;  will be done in room 1   . ESOPHAGOGASTRODUODENOSCOPY Left 05/01/2013   Procedure: ESOPHAGOGASTRODUODENOSCOPY (EGD);  Surgeon: [name], MD;  Location: [facility] ENDOSCOPY;  Service: Endoscopy;  Laterality: Left;  . ESOPHAGOGASTRODUODENOSCOPY N/A 05/11/2013   Procedure: ESOPHAGOGASTRODUODENOSCOPY (EGD);  Surgeon: [name], MD;  Location: [facility] ENDOSCOPY;  Service: Endoscopy;  Laterality: N/A;  . ESOPHAGOGASTRODUODENOSCOPY N/A 10/06/2014   Procedure: ESOPHAGOGASTRODUODENOSCOPY (EGD);  Surgeon: [name], MD;  Location: [facility] ENDOSCOPY;  Service: Endoscopy;  Laterality: N/A;  . ESOPHAGOGASTRODUODENOSCOPY N/A 10/09/2014   Procedure: ESOPHAGOGASTRODUODENOSCOPY (EGD);  Surgeon: [name], MD;  Location:  [facility]  ENDOSCOPY;  Service: Gastroenterology;  Laterality: N/A;  Bedside  . ESOPHAGOGASTRODUODENOSCOPY N/A 04/03/2016   Procedure: ESOPHAGOGASTRODUODENOSCOPY (EGD);  Surgeon: [name], MD;  Location: [facility] ORS;  Service: Gastroenterology;  Laterality: N/A;  . LAPAROTOMY  08/10/2012   Procedure: EXPLORATORY LAPAROTOMY;  Surgeon: [name], MD;  Location[facility] OR;  Service: General;  Laterality: N/A;  . LAPAROTOMY N/A 10/10/2014   Procedure: EXPLORATORY LAPAROTOMY WITH GASTROPATHY AND OVER SEWEN DUODENAL ULCER REGION ;  Surgeon: [name], MD;  Location: [facility] ORS;  Service: General;  Laterality: N/A;  . undescended testicle     on right. noted on 03/2012 CT scan.  no surgery referral.    Social History:  reports that he has been smoking.  He has a 34.00 pack-year smoking history. He has never used smokeless tobacco. He reports that he drinks alcohol. He reports that he uses drugs, including Marijuana.   History reviewed. No pertinent family history.   Allergies  Allergen Reactions  . Nsaids Other (See Comments)    BLEEDING ULCER  . Chocolate Nausea And Vomiting  . Peanut-Containing Drug Products Nausea And Vomiting  . Spinach Nausea And Vomiting     Prior to Admission medications   Medication Sig Start Date End Date Taking? Authorizing Provider  acetaminophen (TYLENOL) 500 MG tablet Take 500 mg by mouth every 6 (six) hours as needed.   Yes Historical Provider, MD  bismuth-metronidazole-tetracycline [facility]) [phone] MG capsule Take 3 capsules by mouth 4 (four) times daily -  before meals and at bedtime. Patient not taking: Reported on 08/08/2016 04/11/16   [name], MD  folic acid (FOLVITE) 1 MG tablet Take 1 tablet (1 mg total) by mouth daily. Patient not taking: Reported on 07/30/2016 04/06/16   [name], DO  Multiple Vitamins-Minerals (MULTIVITAMIN) tablet Take 1 tablet by mouth daily. Patient not taking: Reported on 08/08/2016 04/06/16   [name], DO  omeprazole  (PRILOSEC) 40 MG capsule Take 1 capsule (40 mg total) by mouth daily. 08/09/16   Shon Baton, MD  ondansetron (ZOFRAN ODT) 4 MG disintegrating tablet Take 1 tablet (4 mg total) by mouth every 8 (eight) hours as needed for nausea or vomiting. 08/09/16   Shon Baton, MD  pantoprazole (PROTONIX) 40 MG tablet Take 1 tablet (40 mg total) by mouth daily. Patient not taking: Reported on 08/08/2016 04/06/16   [name], DO  thiamine (VITAMIN B-1) 100 MG tablet Take 1 tablet (100 mg total) by mouth daily. Patient not taking: Reported on 07/30/2016 04/06/16   [name], DO    Review of Systems:  Constitutional:  No weight loss, night sweats, Fevers, chills, fatigue.  Head&Eyes: No headache.  No vision loss.  No eye pain or scotoma ENT:  No Difficulty swallowing,Tooth/dental problems,Sore throat,  No ear ache, post nasal drip,  Cardio-vascular:  No chest pain, Orthopnea, PND, swelling in lower extremities,  dizziness, palpitations  GI:  No   diarrhea, loss of appetite, hematochezia, melena, heartburn, indigestion, Resp:  No shortness of breath with exertion or at rest. No cough. No coughing up of blood .No wheezing.No chest wall deformity  Skin:  no rash or lesions.  GU:  no dysuria, change in color of urine, no urgency or frequency. No flank pain.  Musculoskeletal:  No joint pain or swelling. No decreased range of motion. No back pain.  Psych:  No change in mood or affect.  Neurologic: No headache, no dysesthesia, no focal weakness, no vision loss. No syncope  Physical Exam: Vitals:   08/09/16 1117 08/09/16 1300 08/09/16 1525 08/09/16 1752  BP:  138/84 123/86 136/72  Pulse: 73 80 66 72  Resp: 18 14 18 18   Temp:      TempSrc: Oral     SpO2: 97% 98% 99% 98%  Weight:      Height:       General:  A&O x 3, NAD, nontoxic, pleasant/cooperative Head/Eye: No conjunctival hemorrhage, no icterus, [place]/AT, No nystagmus ENT:  No icterus,  No thrush, good dentition, no pharyngeal  exudate Neck:  No masses, no lymphadenpathy, no bruits CV:  RRR, no rub, no gallop, no S3 Lung:  CTAB, good air movement, no wheeze, no rhonchi Abdomen: soft/NT, +BS, nondistended, no peritoneal signs Ext: No cyanosis, No rashes, No petechiae, No lymphangitis, No edema Neuro: CNII-XII intact, strength 4/5 in bilateral upper and lower extremities, no dysmetria  Labs on Admission:  Basic Metabolic Panel:  Recent Labs Lab 08/07/16 0206 08/08/16 2346 08/09/16 1558  NA 141 143 140  K 4.0 3.7 3.7  CL 106 104 104  CO2 28 30 32  GLUCOSE 108* 138* 108*  BUN 19 22* 24*  CREATININE 1.03 0.99 1.16  CALCIUM 9.2 9.0 8.1*   Liver Function Tests:  Recent Labs Lab 08/07/16 0206 08/08/16 2346 08/09/16 1558  AST 21 17 14*  ALT 18 17 12*  ALKPHOS 63 57 47  BILITOT 0.7 0.4 0.4  PROT 7.5 7.5 5.7*  ALBUMIN 4.0 3.9 3.2*    Recent Labs Lab 08/07/16 0206 08/08/16 2346  LIPASE 27 22   No results for input(s): AMMONIA in the last 168 hours. CBC:  Recent Labs Lab 08/07/16 0206 08/08/16 2346 08/09/16 1527  WBC 17.1* 12.3* 10.4  NEUTROABS  --   --  7.2  HGB 12.6* 12.9* 10.3*  HCT 39.4 39.7 33.2*  MCV 74.8* 74.9* 75.3*  PLT 176 208 161   Coagulation Profile: No results for input(s): INR, PROTIME in the last 168 hours. Cardiac Enzymes: No results for input(s): CKTOTAL, CKMB, CKMBINDEX, TROPONINI in the last 168 hours. BNP: Invalid input(s): POCBNP CBG: No results for input(s): GLUCAP in the last 168 hours. Urine analysis:    Component Value Date/Time   COLORURINE YELLOW 08/09/2016 0537   APPEARANCEUR CLEAR 08/09/2016 0537   LABSPEC 1.033 (H) 08/09/2016 0537   PHURINE 5.0 08/09/2016 0537   GLUCOSEU NEGATIVE 08/09/2016 0537   HGBUR NEGATIVE 08/09/2016 0537   BILIRUBINUR NEGATIVE 08/09/2016 0537   KETONESUR 5 (A) 08/09/2016 0537   PROTEINUR 30 (A) 08/09/2016 0537   UROBILINOGEN 0.2 10/06/2014 0019   NITRITE NEGATIVE 08/09/2016 0537   LEUKOCYTESUR NEGATIVE 08/09/2016  0537   Sepsis Labs: @LABRCNTIP (procalcitonin:4,lacticidven:4) )No results found for this or any previous visit (from the past 240 hour(s)).   Radiological Exams on Admission: Dg Abdomen Acute W/chest  Result Date: 08/09/2016 CLINICAL DATA:  Mid abdominal pain, nausea, vomiting with blood in it for 2 days, history GERD, occasional smoker EXAM: DG ABDOMEN ACUTE W/ 1V CHEST COMPARISON:  09/27/2015 FINDINGS: Normal heart size, mediastinal contours, and pulmonary vascularity. LEFT upper lobe scarring stable. Minimal tenting at LEFT diaphragm stable. Lungs otherwise clear. No pleural effusion or pneumothorax. Stomach distended by food/fluid. Coils noted periduodenal question related to ulcer disease. Nonobstructive bowel gas pattern. Scattered gas and stool in colon. No bowel dilatation, bowel wall thickening, or free air. Osseous structures unremarkable. LEFT pelvic phleboliths noted. No urinary tract calcification. IMPRESSION: LEFT lung scarring without acute infiltrate. Nonobstructive bowel gas pattern with stomach distended  by food/fluid. Electronically Signed   By: Ulyses Southward M.D.   On: 08/09/2016 08:57    EKG: Independently reviewed. Sinus rhythm, no ST-T wave change    Time spent:60 minutes Code Status:   FULL Family Communication:  No Family at bedside Disposition Plan: expect 1-2 day hospitalization Consults called: [facility] DVT Prophylaxis:  SCDs  Godric Lavell, DO  [facility] Pager [phone]  If 7PM-7AM, please contact night-coverage [email or web address] Password [email or web address] 08/09/2016, 6:20 PM

## 2016-08-09 NOTE — ED Notes (Signed)
Bed: WA12 Expected date:  Expected time:  Means of arrival:  Comments: Hall C 

## 2016-08-09 NOTE — Consult Note (Signed)
HPI :   [name] is a 56 y.o. male.  PMH Polysubstance, ETOH abuse.  Personality disorder.  Hx bleeding duodenal ulcers and EGDs with hemostatic therapies dating back to 2014.  S/p 08/2012 and 09/2014 exlap and oversewing of bleeding duodenal ulcer. S/p IR embolization of GDA 05/2013.  Aspiration PNA 09/2014.  Has required blood transfusions for ABL anemia.  Many hospitalizations ended abruptly by pt leaving AMA  10/06/14 EGD for UGIB: Normal esophagus and stomach.  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 Patent left and right gastric arteries are demonstrated. Selective arteriography of the right gastric artery demonstrated no significant duodenal supply. Embolization of the right gastric artery was not performed. No significant pancreaticoduodenal branches were identified from SMA supply 10/09/15 EGD for UGIB.  Blood in stomach, but no source of active bleeding in the Stomach.  Large ulcer again seen in duodenal bulb with large visible vessel; Hemoclip placed at base of visible vessel 10/11/2014 Exploratory laparotomy, gastrorrhaphy, over sewing of duodenal ulcer and external oversewing of feeder vessels to be devascularized. 04/2016 EGD for Upper GIB.  Grade D esophagitis with ulceration.  Non-bleeding gastric ulcers with pigmented material and visible sutrures.  Partial GOO due to extensive peptic ulcers.  Multiple, pigmented, oozing duodenal ulcers.   H Pylori Rx called to pt though no H pylori identified, the pattern of injury was typical for H Pylori.     Patient is a poor historian, history per patient and review of chart. He was given a course of Pylera after last EGD which he reports he never took. He was given protonix 40mg  daily but states he took it only once in recent weeks. He admits to some advil use in recent weeks but can't clarify how much he is taking. He reports he "drinks a  40oz" beer, but can't clarify how much or frequently he drinks.   He has been seen in the ER multiple times in the past few weeks for recurrent epigastric burning and vomiting. He came into the ER yesterday and then returned late last night for recurrent hematemesis. He reports episodes of "coffee ground" emesis. He thinks this has been ongoing for 4 days or so. He reports perhaps an episode to 2 per day, has a hard time quantifying. States he is eating normally and denies any abdominal pain today. His last episode of vomiting per ER staff was this morning, he has been tolerating liquids today.   Labs in last few days noteable for  WBC 11.9.. 17 K.. 12.3 today Hgb14.8.. 12.6.. 12.9.   MCV 74.   BUNs 17.. 19.. 22. LFTs normal.  Abdominal films today: Nonobstructive bowel gas pattern with stomach distended by food/fluid.   Past Medical History:  Diagnosis Date  . Abdominal pain, other specified site   . Abnormal CT scan, esophagus 03/2012  . GERD (gastroesophageal reflux disease)   . Substance abuse 03/2012   tox screen positive for cocaine.   recurrent GI bleeding, history of PUD Alcohol abuse Personality disorder   Past Surgical History:  Procedure Laterality Date  . ESOPHAGOGASTRODUODENOSCOPY  08/10/2012   Procedure: ESOPHAGOGASTRODUODENOSCOPY (EGD);  Surgeon: [name], MD;  Location: [facility] ENDOSCOPY;  Service: Endoscopy;  Laterality: N/A;  will be done in room 1   . ESOPHAGOGASTRODUODENOSCOPY Left 05/01/2013   Procedure: ESOPHAGOGASTRODUODENOSCOPY (EGD);  Surgeon: [name], MD;  Location: [facility] ENDOSCOPY;  Service: Endoscopy;  Laterality: Left;  .  ESOPHAGOGASTRODUODENOSCOPY N/A 05/11/2013   Procedure: ESOPHAGOGASTRODUODENOSCOPY (EGD);  Surgeon: Graylin ShiverSalem F Ganem, MD;  [facility];  Service: Endoscopy;  Laterality: N/A;  . ESOPHAGOGASTRODUODENOSCOPY N/A 10/06/2014   Procedure: ESOPHAGOGASTRODUODENOSCOPY (EGD);  Surgeon: Beverley FiedlerJay M Pyrtle, MD;  [facility];  Service:  Endoscopy;  Laterality: N/A;  . ESOPHAGOGASTRODUODENOSCOPY N/A 10/09/2014   Procedure: ESOPHAGOGASTRODUODENOSCOPY (EGD);  Surgeon: Beverley FiedlerJay M Pyrtle, MD;  [facility];  Service: Gastroenterology;  Laterality: N/A;  Bedside  . ESOPHAGOGASTRODUODENOSCOPY N/A 04/03/2016   Procedure: ESOPHAGOGASTRODUODENOSCOPY (EGD);  Surgeon: Napoleon FormKavitha V Nandigam, MD;  Location: [facility] ORS;  Service: Gastroenterology;  Laterality: N/A;  . LAPAROTOMY  08/10/2012   Procedure: EXPLORATORY LAPAROTOMY;  Surgeon: Cherylynn RidgesJames O Wyatt, MD;  [facility];  Service: General;  Laterality: N/A;  . LAPAROTOMY N/A 10/10/2014   Procedure: EXPLORATORY LAPAROTOMY WITH GASTROPATHY AND OVER SEWEN DUODENAL ULCER REGION ;  Surgeon: Luretha MurphyMatthew Martin, MD;  Location: [facility] ORS;  Service: General;  Laterality: N/A;  . undescended testicle     on right. noted on 03/2012 CT scan.  no surgery referral.    History reviewed. No pertinent family history. Social History  Substance Use Topics  . Smoking status: Light Tobacco Smoker    Packs/day: 1.00    Years: 34.00  . Smokeless tobacco: Never Used  . Alcohol use Yes     Comment: 40oz beer/day   Current Facility-Administered Medications  Medication Dose Route Frequency Provider Last Rate Last Dose  . pantoprazole (PROTONIX) injection 40 mg  40 mg Intravenous BID Courteney Lyn Mackuen, MD       Current Outpatient Prescriptions  Medication Sig Dispense Refill  . acetaminophen (TYLENOL) 500 MG tablet Take 500 mg by mouth every 6 (six) hours as needed.    . bismuth-metronidazole-tetracycline (PYLERA) 140-125-125 MG capsule Take 3 capsules by mouth 4 (four) times daily -  before meals and at bedtime. (Patient not taking: Reported on 08/08/2016) 120 capsule 0  . folic acid (FOLVITE) 1 MG tablet Take 1 tablet (1 mg total) by mouth daily. (Patient not taking: Reported on 07/30/2016) 30 tablet 0  . Multiple Vitamins-Minerals (MULTIVITAMIN) tablet Take 1 tablet by mouth daily. (Patient not taking: Reported on  08/08/2016) 30 tablet 0  . omeprazole (PRILOSEC) 40 MG capsule Take 1 capsule (40 mg total) by mouth daily. 30 capsule 0  . ondansetron (ZOFRAN ODT) 4 MG disintegrating tablet Take 1 tablet (4 mg total) by mouth every 8 (eight) hours as needed for nausea or vomiting. 20 tablet 0  . pantoprazole (PROTONIX) 40 MG tablet Take 1 tablet (40 mg total) by mouth daily. (Patient not taking: Reported on 08/08/2016) 60 tablet 0  . thiamine (VITAMIN B-1) 100 MG tablet Take 1 tablet (100 mg total) by mouth daily. (Patient not taking: Reported on 07/30/2016) 30 tablet 0   Allergies  Allergen Reactions  . Nsaids Other (See Comments)    BLEEDING ULCER  . Chocolate Nausea And Vomiting  . Peanut-Containing Drug Products Nausea And Vomiting  . Spinach Nausea And Vomiting     Review of Systems: All systems reviewed and negative except where noted in HPI.    Dg Abdomen Acute W/chest  Result Date: 08/09/2016 CLINICAL DATA:  Mid abdominal pain, nausea, vomiting with blood in it for 2 days, history GERD, occasional smoker EXAM: DG ABDOMEN ACUTE W/ 1V CHEST COMPARISON:  09/27/2015 FINDINGS: Normal heart size, mediastinal contours, and pulmonary vascularity. LEFT upper lobe scarring stable. Minimal tenting at LEFT diaphragm stable. Lungs otherwise clear. No pleural effusion or  pneumothorax. Stomach distended by food/fluid. Coils noted periduodenal question related to ulcer disease. Nonobstructive bowel gas pattern. Scattered gas and stool in colon. No bowel dilatation, bowel wall thickening, or free air. Osseous structures unremarkable. LEFT pelvic phleboliths noted. No urinary tract calcification. IMPRESSION: LEFT lung scarring without acute infiltrate. Nonobstructive bowel gas pattern with stomach distended by food/fluid. Electronically Signed   By: Ulyses Southward M.D.   On: 08/09/2016 08:57   Lab Results  Component Value Date   WBC 10.4 08/09/2016   HGB 10.3 (L) 08/09/2016   HCT 33.2 (L) 08/09/2016   MCV 75.3 (L)  08/09/2016   PLT 161 08/09/2016    Lab Results  Component Value Date   CREATININE 0.99 08/08/2016   BUN 22 (H) 08/08/2016   NA 143 08/08/2016   K 3.7 08/08/2016   CL 104 08/08/2016   CO2 30 08/08/2016    Lab Results  Component Value Date   ALT 17 08/08/2016   AST 17 08/08/2016   ALKPHOS 57 08/08/2016   BILITOT 0.4 08/08/2016      Physical Exam: BP 123/86 (BP Location: Left Arm)   Pulse 66   Temp 98.4 F (36.9 C) (Oral)   Resp 18   Ht 5\' 9"  (1.753 m)   Wt 165 lb (74.8 kg)   SpO2 99%   BMI 24.37 kg/m  Constitutional: male in no acute distress. HEENT: Normocephalic and atraumatic. Conjunctivae are normal. No scleral icterus. Neck supple.  Cardiovascular: Normal rate, regular rhythm.  Pulmonary/chest: Effort normal and breath sounds normal. . Abdominal: Soft, nondistended, nontender. There are no masses palpable.  Extremities: no edema Lymphadenopathy: No cervical adenopathy noted. Neurological: Alert and oriented to person place and time. Skin: Skin is warm and dry. No rashes noted. Psychiatric: Normal mood and affect. Behavior is normal.   ASSESSMENT AND PLAN: 56 y/o male with history of PUD, esophagitis, and recurrent GI bleeding, presenting with multiple episodes of coffee-ground emesis over the past 4 days or so. Hemoglobin 14.8 about 10 days ago days ago has drifted down to hemoglobin of 10.3 today. His last episode of vomiting was this morning. He is not in any discomfort at present time. Last EGD in September showed severe esophagitis with multiple gastric ulcers for which he did not follow-up for therapy, did not take Pylera nor Protonix is recommended.  It very likely has recurrent bleeding from esophagitis and/or peptic ulcer disease given his noncompliance to therapy. He warrants an EGD to further evaluate but will need anesthesia for sedation given his alcohol use. He is hemodynamically stable right now  Recommendation: - continue IV protonix 40mg  BID -  clear liquids for now (patient has tolerated them today) - plan for EGD tomorrow morning, tentatively scheduled at 945 AM tomorrow with Dr. Rhea Belton as there is no anesthesia available at present time unless he warrants emergent EGD tonight. I discussed risks / benefits with him and he wishes to proceed. - serial Hgb, monitor for recurrent of bleeding - Zofran PRN for nausea  Please call with questions or changes in his status overnight. Thanks.  Ileene Patrick, MD [facility] Pager [phone]

## 2016-08-09 NOTE — Progress Notes (Signed)
Patient has meds from home that needs to be locked up I the pharmacy

## 2016-08-09 NOTE — Discharge Instructions (Signed)
You were seen today for vomiting. It is very important that you get your nausea medication refilled. Avoid alcohol.

## 2016-08-09 NOTE — ED Notes (Addendum)
Attempted report x1, RN in room giving patient care. RN to call back.

## 2016-08-09 NOTE — ED Provider Notes (Signed)
Pt had black emesis per EMS.  Signed out pending labs, EKG, trop and KUB. All normal. However patient continuing to vomit. Trialed PO X 2. Protonix given. Ho EGD done that showed Grade D erosive esophagitis with outlet obstruction and PUD.  Pt has been non complaint with meds.  Will need to admit for obs given continuing vomiting.      Courteney Randall AnLyn Mackuen, MD 08/09/16 1328

## 2016-08-09 NOTE — ED Notes (Addendum)
Report given to Abby, RN. 5E.

## 2016-08-09 NOTE — ED Provider Notes (Addendum)
[facility]-EMERGENCY DEPT Provider Note   CSN: [id number] Arrival date & time: 08/08/16  2328     History   Chief Complaint Chief Complaint  Patient presents with  . Emesis    HPI [name] is a 56 y.o. male.  HPI  This is a 56 yo male who presents with persistent emesis.   She reports persistent abdominal pain, nausea, vomiting since yesterday. He was seen and evaluated yesterday for the same. This was felt to be gastritis. He improved with a GI cocktail. He did not get his nausea medication filled. He reports persistent emesis. He denies any recurrent alcohol use. He has a history of ulcers and upper GI bleed. Denies bloody emesis. Reports epigastric pain but nonradiating. Current pain is 8 out of 10. He is requesting a GI cocktail. Reports normal bowel movements. No fevers.  Past Medical History:  Diagnosis Date  . Abdominal pain, other specified site   . Abnormal CT scan, esophagus 03/2012  . GERD (gastroesophageal reflux disease)   . Substance abuse 03/2012   tox screen positive for cocaine.     Patient Active Problem List   Diagnosis Date Noted  . History of alcohol abuse   . 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 Duodenitis 04/02/2016  . GI bleed 04/02/2016  . Personality disorder 10/20/2014  . Acute upper GI bleed   . Ileus, postoperative (HCC) 10/15/2014  . Aspiration pneumonia (HCC) 10/14/2014  . Polysubstance abuse 10/09/2014  . UGIB (upper gastrointestinal bleed) 10/08/2014  . Leukocytosis 04/30/2013  . Microcytic anemia 04/30/2013  . Hematemesis 08/10/2012  . Anemia due to GI blood loss 08/10/2012  . Acute respiratory failure (HCC) 08/10/2012  . Hypertension 08/10/2012  . Duodenal ulcer with hemorrhage s/p oversew 10/11/2014 08/10/2012  . Encephalopathy acute 08/10/2012    Past Surgical History:  Procedure Laterality Date  . ESOPHAGOGASTRODUODENOSCOPY  08/10/2012   Procedure: ESOPHAGOGASTRODUODENOSCOPY (EGD);  Surgeon: [name], MD;  Location: [facility]  ENDOSCOPY;  Service: Endoscopy;  Laterality: N/A;  will be done in room 1   . ESOPHAGOGASTRODUODENOSCOPY Left 05/01/2013   Procedure: ESOPHAGOGASTRODUODENOSCOPY (EGD);  Surgeon: [name], MD;  Location: [facility] ENDOSCOPY;  Service: Endoscopy;  Laterality: Left;  . ESOPHAGOGASTRODUODENOSCOPY N/A 05/11/2013   Procedure: ESOPHAGOGASTRODUODENOSCOPY (EGD);  Surgeon: [name], MD;  Location: [facility] ENDOSCOPY;  Service: Endoscopy;  Laterality: N/A;  . ESOPHAGOGASTRODUODENOSCOPY N/A 10/06/2014   Procedure: ESOPHAGOGASTRODUODENOSCOPY (EGD);  Surgeon: [name], MD;  Location: [facility] ENDOSCOPY;  Service: Endoscopy;  Laterality: N/A;  . ESOPHAGOGASTRODUODENOSCOPY N/A 10/09/2014   Procedure: ESOPHAGOGASTRODUODENOSCOPY (EGD);  Surgeon: [name], MD;  Location: [facility] ENDOSCOPY;  Service: Gastroenterology;  Laterality: N/A;  Bedside  . ESOPHAGOGASTRODUODENOSCOPY N/A 04/03/2016   Procedure: ESOPHAGOGASTRODUODENOSCOPY (EGD);  Surgeon: [name], MD;  Location: [facility] ORS;  Service: Gastroenterology;  Laterality: N/A;  . LAPAROTOMY  08/10/2012   Procedure: EXPLORATORY LAPAROTOMY;  Surgeon: [name], MD;  Location: [facility] OR;  Service: General;  Laterality: N/A;  . LAPAROTOMY N/A 10/10/2014   Procedure: EXPLORATORY LAPAROTOMY WITH GASTROPATHY AND OVER SEWEN DUODENAL ULCER REGION ;  Surgeon: [name], MD;  Location: [facility] ORS;  Service: General;  Laterality: N/A;  . undescended testicle     on right. noted on 03/2012 CT scan.  no surgery referral.        Home Medications    Prior to Admission medications   Medication Sig Start Date End Date Taking? Authorizing Provider  acetaminophen (TYLENOL) 500 MG tablet Take 500 mg by mouth every 6 (  six) hours as needed.   Yes Historical Provider, MD  omeprazole (PRILOSEC) 20 MG capsule Take 1 capsule (20 mg total) by mouth daily. 07/30/16  Yes Francisco [address] [place], [place]  bismuth-metronidazole-tetracycline [facility]) [phone] MG capsule Take 3 capsules by mouth 4 (four)  times daily -  before meals and at bedtime. Patient not taking: Reported on 08/08/2016 04/11/16   [name], MD  folic acid (FOLVITE) 1 MG tablet Take 1 tablet (1 mg total) by mouth daily. Patient not taking: Reported on 07/30/2016 04/06/16   [name], DO  Multiple Vitamins-Minerals (MULTIVITAMIN) tablet Take 1 tablet by mouth daily. Patient not taking: Reported on 08/08/2016 04/06/16   Maryann Mikhail, DO  ondansetron (ZOFRAN ODT) 4 MG disintegrating tablet Take 1 tablet (4 mg total) by mouth every 8 (eight) hours as needed for nausea or vomiting. 08/09/16   Shon Baton, MD  pantoprazole (PROTONIX) 40 MG tablet Take 1 tablet (40 mg total) by mouth daily. Patient not taking: Reported on 08/08/2016 04/06/16   [name], DO  thiamine (VITAMIN B-1) 100 MG tablet Take 1 tablet (100 mg total) by mouth daily. Patient not taking: Reported on 07/30/2016 04/06/16   [name], DO    Family History History reviewed. No pertinent family history.  Social History Social History  Substance Use Topics  . Smoking status: Light Tobacco Smoker    Packs/day: 1.00    Years: 34.00  . Smokeless tobacco: Never Used  . Alcohol use Yes     Comment: 40oz beer/day     Allergies   Nsaids; Chocolate; Peanut-containing drug products; and Spinach   Review of Systems Review of Systems  Constitutional: Negative for fever.  Respiratory: Negative for shortness of breath.   Cardiovascular: Negative for chest pain.  Gastrointestinal: Positive for abdominal pain, nausea and vomiting. Negative for blood in stool.  All other systems reviewed and are negative.    Physical Exam Updated Vital Signs BP 122/93 (BP Location: Left Arm)   Pulse 76   Temp 98.4 F (36.9 C) (Oral)   Resp 20   Ht 5\' 9"  (1.753 m)   Wt 165 lb (74.8 kg)   SpO2 98%   BMI 24.37 kg/m   Physical Exam  Constitutional: He is oriented to person, place, and time.  Disheveled, nontoxic appearing, no acute distress  HENT:    Head: Normocephalic and atraumatic.  Cardiovascular: Normal rate, regular rhythm and normal heart sounds.   No murmur heard. Pulmonary/Chest: Effort normal and breath sounds normal. No respiratory distress. He has no wheezes.  Abdominal: Soft. Bowel sounds are normal. There is no tenderness. There is no rebound and no guarding.  Midline abdominal scarring  Musculoskeletal: He exhibits no edema.  Neurological: He is alert and oriented to person, place, and time.  Skin: Skin is warm and dry.  Psychiatric: He has a normal mood and affect.  Nursing note and vitals reviewed.    ED Treatments / Results  Labs (all labs ordered are listed, but only abnormal results are displayed) Labs Reviewed  COMPREHENSIVE METABOLIC PANEL - Abnormal; Notable for the following:       Result Value   Glucose, Bld 138 (*)    BUN 22 (*)    All other components within normal limits  CBC - Abnormal; Notable for the following:    WBC 12.3 (*)    Hemoglobin 12.9 (*)    MCV 74.9 (*)    MCH 24.3 (*)    RDW 18.3 (*)  All other components within normal limits  URINALYSIS, ROUTINE W REFLEX MICROSCOPIC - Abnormal; Notable for the following:    Specific Gravity, Urine 1.033 (*)    Ketones, ur 5 (*)    Protein, ur 30 (*)    Squamous Epithelial / LPF 0-5 (*)    All other components within normal limits  LIPASE, BLOOD    EKG  EKG Interpretation None       Radiology No results found.  Procedures Procedures (including critical care time)  Medications Ordered in ED Medications  ondansetron (ZOFRAN-ODT) disintegrating tablet 4 mg (4 mg Oral Given 08/08/16 2341)  ondansetron (ZOFRAN-ODT) disintegrating tablet 4 mg (4 mg Oral Given 08/09/16 0425)  gi cocktail (Maalox,Lidocaine,Donnatal) (30 mLs Oral Given 08/09/16 0524)     Initial Impression / Assessment and Plan / ED Course  I have reviewed the triage vital signs and the nursing notes.  Pertinent labs & imaging results that were available during my  care of the patient were reviewed by me and considered in my medical decision making (see chart for details).  Clinical Course     Patient presents with continued epigastric pain and vomiting. History of ulcers. Seen yesterday for the same and diagnosed with gastritis. Has not filled his prescriptions. Denies continued alcohol use. Vital signs reassuring. Exam is fairly benign. No abdominal tenderness on exam. Lab work obtained and reassuring. Patient was given a GI cocktail. He has had no observed emesis while in the ED.  Given benign exam, do not feel he needs further imaging or workup. I have stressed the importance of getting his prescriptions filled. I will give him additional prescriptions for omeprazole and Zofran.  After history, exam, and medical workup I feel the patient has been appropriately medically screened and is safe for discharge home. Pertinent diagnoses were discussed with the patient. Patient was given return precautions.  8:05 AM  Patient now complaining the pain has returned. He is now complaining of chest pain. He is remains nontoxic and exam remains benign. EKG, acute abdominal series, troponin ordered. Patient given fluids, Carafate, Protonix, GI cocktail.  Signed out to oncoming doc.   Final Clinical Impressions(s) / ED Diagnoses   Final diagnoses:  Non-intractable vomiting with nausea, unspecified vomiting type  Gastritis without bleeding, unspecified chronicity, unspecified gastritis type    New Prescriptions New Prescriptions   ONDANSETRON (ZOFRAN ODT) 4 MG DISINTEGRATING TABLET    Take 1 tablet (4 mg total) by mouth every 8 (eight) hours as needed for nausea or vomiting.     Shon Batonourtney F Cecily Lawhorne, MD 08/09/16 [id number]    Shon Batonourtney F Suzzanne Brunkhorst, MD 08/09/16 [phone]

## 2016-08-10 ENCOUNTER — Encounter (HOSPITAL_COMMUNITY)
Admission: EM | Disposition: A | Discharge status: Home / Self Care | Payer: Self-pay | Source: Home / Self Care | Attending: Internal Medicine

## 2016-08-10 ENCOUNTER — Observation Stay (HOSPITAL_COMMUNITY): Payer: Medicaid Other | Admitting: Anesthesiology

## 2016-08-10 ENCOUNTER — Encounter (HOSPITAL_COMMUNITY): Payer: Self-pay

## 2016-08-10 DIAGNOSIS — F1010 Alcohol abuse, uncomplicated: Secondary | ICD-10-CM | POA: Diagnosis present

## 2016-08-10 DIAGNOSIS — K209 Esophagitis, unspecified without bleeding: Secondary | ICD-10-CM

## 2016-08-10 DIAGNOSIS — K259 Gastric ulcer, unspecified as acute or chronic, without hemorrhage or perforation: Secondary | ICD-10-CM | POA: Diagnosis present

## 2016-08-10 DIAGNOSIS — K210 Gastro-esophageal reflux disease with esophagitis: Secondary | ICD-10-CM | POA: Diagnosis present

## 2016-08-10 DIAGNOSIS — F17210 Nicotine dependence, cigarettes, uncomplicated: Secondary | ICD-10-CM | POA: Diagnosis present

## 2016-08-10 DIAGNOSIS — K922 Gastrointestinal hemorrhage, unspecified: Secondary | ICD-10-CM | POA: Diagnosis not present

## 2016-08-10 DIAGNOSIS — D72829 Elevated white blood cell count, unspecified: Secondary | ICD-10-CM | POA: Diagnosis present

## 2016-08-10 DIAGNOSIS — D62 Acute posthemorrhagic anemia: Secondary | ICD-10-CM | POA: Diagnosis present

## 2016-08-10 DIAGNOSIS — K2980 Duodenitis without bleeding: Secondary | ICD-10-CM | POA: Diagnosis present

## 2016-08-10 DIAGNOSIS — Z9119 Patient's noncompliance with other medical treatment and regimen: Secondary | ICD-10-CM | POA: Diagnosis not present

## 2016-08-10 DIAGNOSIS — K2970 Gastritis, unspecified, without bleeding: Secondary | ICD-10-CM | POA: Diagnosis present

## 2016-08-10 DIAGNOSIS — K920 Hematemesis: Secondary | ICD-10-CM | POA: Diagnosis present

## 2016-08-10 HISTORY — PX: ESOPHAGOGASTRODUODENOSCOPY (EGD) WITH PROPOFOL: SHX5813

## 2016-08-10 LAB — CBC
HEMATOCRIT: 30.5 % — AB (ref 39.0–52.0)
HEMATOCRIT: 32.5 % — AB (ref 39.0–52.0)
HEMOGLOBIN: 10.3 g/dL — AB (ref 13.0–17.0)
Hemoglobin: 9.6 g/dL — ABNORMAL LOW (ref 13.0–17.0)
MCH: 23.8 pg — ABNORMAL LOW (ref 26.0–34.0)
MCH: 24.2 pg — ABNORMAL LOW (ref 26.0–34.0)
MCHC: 31.5 g/dL (ref 30.0–36.0)
MCHC: 31.7 g/dL (ref 30.0–36.0)
MCV: 75.7 fL — AB (ref 78.0–100.0)
MCV: 76.3 fL — ABNORMAL LOW (ref 78.0–100.0)
PLATELETS: 144 10*3/uL — AB (ref 150–400)
Platelets: 177 10*3/uL (ref 150–400)
RBC: 4.03 MIL/uL — ABNORMAL LOW (ref 4.22–5.81)
RBC: 4.26 MIL/uL (ref 4.22–5.81)
RDW: 17.9 % — ABNORMAL HIGH (ref 11.5–15.5)
RDW: 18 % — ABNORMAL HIGH (ref 11.5–15.5)
WBC: 6.2 10*3/uL (ref 4.0–10.5)
WBC: 6 10*3/uL (ref 4.0–10.5)

## 2016-08-10 LAB — BASIC METABOLIC PANEL
ANION GAP: 4 — AB (ref 5–15)
BUN: 17 mg/dL (ref 6–20)
CHLORIDE: 105 mmol/L (ref 101–111)
CO2: 28 mmol/L (ref 22–32)
CREATININE: 1.1 mg/dL (ref 0.61–1.24)
Calcium: 8.1 mg/dL — ABNORMAL LOW (ref 8.9–10.3)
GLUCOSE: 105 mg/dL — AB (ref 65–99)
Potassium: 3.9 mmol/L (ref 3.5–5.1)
Sodium: 137 mmol/L (ref 135–145)

## 2016-08-10 LAB — APTT: aPTT: 30 seconds (ref 24–36)

## 2016-08-10 LAB — PROTIME-INR
INR: 1.09
Prothrombin Time: 14.1 seconds (ref 11.4–15.2)

## 2016-08-10 SURGERY — ESOPHAGOGASTRODUODENOSCOPY (EGD) WITH PROPOFOL
Anesthesia: Monitor Anesthesia Care

## 2016-08-10 MED ORDER — LACTATED RINGERS IV SOLN
INTRAVENOUS | Status: DC
  Administered 2016-08-10: 10:00:00 via INTRAVENOUS

## 2016-08-10 MED ORDER — PROPOFOL 500 MG/50ML IV EMUL
INTRAVENOUS | Status: DC | PRN
  Administered 2016-08-10: 20 mg via INTRAVENOUS
  Administered 2016-08-10: 50 mg via INTRAVENOUS

## 2016-08-10 MED ORDER — PANTOPRAZOLE SODIUM 40 MG PO TBEC
40.0000 mg | DELAYED_RELEASE_TABLET | Freq: Every day | ORAL | Status: DC
  Administered 2016-08-10: 40 mg via ORAL
  Filled 2016-08-10 (×4): qty 1

## 2016-08-10 MED ORDER — PROPOFOL 10 MG/ML IV BOLUS
INTRAVENOUS | Status: AC
  Filled 2016-08-10: qty 40

## 2016-08-10 MED ORDER — PANTOPRAZOLE SODIUM 40 MG PO TBEC
40.0000 mg | DELAYED_RELEASE_TABLET | Freq: Two times a day (BID) | ORAL | Status: DC
Start: 2016-08-10 — End: 2016-08-11
  Administered 2016-08-10 – 2016-08-11 (×2): 40 mg via ORAL
  Filled 2016-08-10 (×2): qty 1

## 2016-08-10 MED ORDER — SUCRALFATE 1 GM/10ML PO SUSP
1.0000 g | Freq: Three times a day (TID) | ORAL | Status: DC
  Administered 2016-08-10 – 2016-08-11 (×4): 1 g via ORAL
  Filled 2016-08-10 (×12): qty 10

## 2016-08-10 MED ORDER — PROPOFOL 500 MG/50ML IV EMUL
INTRAVENOUS | Status: DC | PRN
  Administered 2016-08-10: 150 ug/kg/min via INTRAVENOUS

## 2016-08-10 SURGICAL SUPPLY — 14 items

## 2016-08-10 NOTE — Transfer of Care (Signed)
Immediate Anesthesia Transfer of Care Note  Patient: [name]  Procedure(s) Performed: Procedure(s): ESOPHAGOGASTRODUODENOSCOPY (EGD) WITH PROPOFOL (N/A)  Patient Location: PACU  Anesthesia Type:MAC  Level of Consciousness: awake, alert  and oriented  Airway & Oxygen Therapy: Patient Spontanous Breathing and Patient connected to nasal cannula oxygen  Post-op Assessment: Report given to RN and Post -op Vital signs reviewed and stable  Post vital signs: Reviewed and stable  Last Vitals:  Vitals:   08/10/16 0633 08/10/16 0912  BP: 116/68 132/81  Pulse: (!) 59 61  Resp:  17  Temp: 37.2 C 36.8 C    Last Pain:  Vitals:   08/10/16 0912  TempSrc: Oral  PainSc:          Complications: No apparent anesthesia complications

## 2016-08-10 NOTE — Anesthesia Postprocedure Evaluation (Addendum)
Anesthesia Post Note  Patient: [name]  Procedure(s) Performed: Procedure(s) (LRB): ESOPHAGOGASTRODUODENOSCOPY (EGD) WITH PROPOFOL (N/A)  Patient location during evaluation: PACU Anesthesia Type: MAC Level of consciousness: awake Pain management: pain level controlled Vital Signs Assessment: post-procedure vital signs reviewed and stable Respiratory status: spontaneous breathing Cardiovascular status: stable Postop Assessment: no signs of nausea or vomiting Anesthetic complications: no        Last Vitals:  Vitals:   08/10/16 1040 08/10/16 1050  BP: 125/69 127/72  Pulse: (!) 57 60  Resp: 20 (!) 21  Temp:      Last Pain:  Vitals:   08/10/16 1030  TempSrc: Oral  PainSc:    Pain Goal:                 Anokhi Shannon JR,JOHN Sherly Brodbeck

## 2016-08-10 NOTE — Op Note (Signed)
[facility] Patient Name: Ryan GradRandy Chapa Procedure Date: 08/10/2016 MRN: [id number] Attending MD: Beverley FiedlerJay M Maryn Freelove , MD Date of Birth: 04/29/1961 CSN: [id number] Age: [age] Admit Type: Inpatient Procedure:                Upper GI endoscopy Indications:              Acute post hemorrhagic anemia, Coffee-ground                            emesis, history of duodenal ulcer disease status                            post embolization and surgery, history of gastric                            ulcers and esophagitis Providers:                Carie CaddyJay M. Rhea BeltonPyrtle, MD, Norman ClayLisa Nunn, RN, Lorenda IshiharaSam Tetteh,                            Technician, Roni BreadGlenn Stubblefield, CRNA Referring MD:             [facility] Medicines:                Monitored Anesthesia Care Complications:            No immediate complications. Estimated Blood Loss:     Estimated blood loss: none. Procedure:                Pre-Anesthesia Assessment:                           - Prior to the procedure, a History and Physical                            was performed, and patient medications and                            allergies were reviewed. The patient's tolerance of                            previous anesthesia was also reviewed. The risks                            and benefits of the procedure and the sedation                            options and risks were discussed with the patient.                            All questions were answered, and informed consent                            was obtained. Prior Anticoagulants: The patient has  taken no previous anticoagulant or antiplatelet                            agents. ASA Grade Assessment: III - A patient with                            severe systemic disease. After reviewing the risks                            and benefits, the patient was deemed in                            satisfactory condition to undergo the procedure.            After obtaining informed consent, the endoscope was                            passed under direct vision. Throughout the                            procedure, the patient's blood pressure, pulse, and                            oxygen saturations were monitored continuously. The                            EG-[id number] ([id number]) scope was introduced through the                            mouth, and advanced to the second part of duodenum.                            The upper GI endoscopy was accomplished without                            difficulty. The patient tolerated the procedure                            well. Scope In: Scope Out: Findings:      LA Grade D (one or more mucosal breaks involving at least 75% of       esophageal circumference) esophagitis with bleeding was found 35 to 40       cm from the incisors.      Localized severe inflammation characterized by adherent blood,       congestion (edema), erythema, friability and deep ulcerations was found       in the prepyloric region of the stomach and at the pylorus. The pyloric       channel is slightly narrowed, but the standard adult upper endoscope       passes into the bulb.      Localized moderate inflammation characterized by erythema was found in       the duodenal bulb. There is deformity from prior ulcer disease and       intervention (GDA embolization and surgery) in the bulb but no  ulceration seen in the duodenum.      The second portion of the duodenum was normal. Impression:               - LA Grade D reflux and acute esophagitis.                           - Gastritis with severe peptic ulcer disease in the                            distal antrum, prepyloric stomach and pyloric                            channel.                           - Duodenitis.                           - Normal second portion of the duodenum. Moderate Sedation:      N/A Recommendation:           - Return patient to  hospital ward for ongoing care.                           - Soft diet and low residue diet.                           - Continue present medications. Twice daily PPI for                            at least 12 weeks, then once daily indefinitely.                           - No aspirin, ibuprofen, naproxen, or other                            non-steroidal anti-inflammatory drugs.                           - Would recommend restarting Pylera for empiric H.                            pylori therapy                           - Alcohol avoidance as this worsens gastritis and                            ulcer disease Procedure Code(s):        --- Professional ---                           [phone], Esophagogastroduodenoscopy, flexible,                            transoral; diagnostic, including collection of  specimen(s) by brushing or washing, when performed                            (separate procedure) Diagnosis Code(s):        --- Professional ---                           K21.0, Gastro-esophageal reflux disease with                            esophagitis                           K29.70, Gastritis, unspecified, without bleeding                           K29.80, Duodenitis without bleeding                           D62, Acute posthemorrhagic anemia                           K92.0, Hematemesis CPT copyright 2016 American Medical Association. All rights reserved. The codes documented in this report are preliminary and upon coder review may  be revised to meet current compliance requirements. [name], MD 08/10/2016 10:27:12 AM This report has been signed electronically. Number of Addenda: 0

## 2016-08-10 NOTE — Progress Notes (Addendum)
PROGRESS NOTE    [name]  ZOX:[id number] DOB: [birth year] DOA: 08/09/2016 PCP: [name], MD   Brief Narrative:  Patient admitted for upper GI bleed which has been a recurrent issue for him. He has been scoped multiple times. He is to get EGD today.    Assessment & Plan:   Active Problems:   Coffee ground emesis   Upper GI bleed   Gastritis without bleeding   Hematemesis without nausea   Alcohol abuse  Hematemesis/upper GI bleed -Likely has recurrent bleeding from esophagitis and/or peptic ulcer disease -Continue Protonix IV twice a day until after egd  -EGD to be done this morning -monitor Hgb  Anemia: likely due the blood loss -will cont to monitor for now, no need for transfusion.  -Baseline appears to be around 12.0  Alcohol abuse -Alcohol withdrawal protocol in place, last drink 5 days ago according to him. -Urine drug screen Positive for Barb and Cocaine   Peptic ulcer disease -cont PPI  Leukocytosis -resolved   DVT prophylaxis: SCDs Code Status: Full  Family Communication:  Patient is able to comprehend none required but I have asked the patient to let me know if he wants me to.  Disposition Plan: Cont Med-Surg  It is my clinical opinion that admission to INPATIENT is reasonable and necessary in this 56 y.o. male . presenting with symptoms of hemetemesis concerning for Peptic or duodenal ulcer bleed as he has his tory of it. . in the context of PMH including: Hx of bleeding peptic and duodenal ulcer . with pertinent positives on physical exam including: Hematemesis apparently in ED . and pertinent positives on radiographic and laboratory data including: Drop in Hb from his baseline due to the acute loss . Workup and treatment include currently inprogress, as he is to get EGD  Given the aforementioned, the predictability of an adverse outcome is felt to be significant. I expect that the patient will require at least 2 midnights in the hospital to  treat this condition.  Consultants:   GI  Procedures:   EGD today - results pending   Antimicrobials:   None    Subjective: Patient doesn't have any complaints, he is eager to get his egd today. He wants to eat but I have  Told him it will be after his procedure depending what the findings are.  No more bleeding so far on the floors.   Objective: Vitals:   08/09/16 1841 08/09/16 2134 08/10/16 0633 08/10/16 0912  BP: 121/81 (!) 142/72 116/68 132/81  Pulse: 70 82 (!) 59 61  Resp: 18 18  17   Temp: 99 F (37.2 C) 100.2 F (37.9 C) 98.9 F (37.2 C) 98.3 F (36.8 C)  TempSrc: Oral Oral Oral Oral  SpO2: 100% 97%  99%  Weight: 76.2 kg (168 lb 1.6 oz)     Height: 5\' 10"  (1.778 m)       Intake/Output Summary (Last 24 hours) at 08/10/16 1003 Last data filed at 08/10/16 0400  Gross per 24 hour  Intake           476.25 ml  Output                0 ml  Net           476.25 ml   Filed Weights   08/08/16 2334 08/09/16 1841  Weight: 74.8 kg (165 lb) 76.2 kg (168 lb 1.6 oz)    Examination:  General exam: Appears calm and comfortable  Respiratory system: Clear  to auscultation. Respiratory effort normal. Cardiovascular system: S1 & S2 heard, RRR. No JVD, murmurs, rubs, gallops or clicks. No pedal edema. Gastrointestinal system: Abdomen is nondistended, soft and nontender. No organomegaly or masses felt. Normal bowel sounds heard. Central nervous system: Alert and oriented. No focal neurological deficits. Extremities: Symmetric 5 x 5 power. Skin: No rashes, lesions or ulcers Psychiatry: Judgement and insight appear normal. Mood & affect appropriate.     Data Reviewed:   CBC:  Recent Labs Lab 08/07/16 0206 08/08/16 2346 08/09/16 1527 08/09/16 2256 08/10/16 0655  WBC 17.1* 12.3* 10.4  --  6.2  NEUTROABS  --   --  7.2  --   --   HGB 12.6* 12.9* 10.3* 10.2* 9.6*  HCT 39.4 39.7 33.2* 32.1* 30.5*  MCV 74.8* 74.9* 75.3*  --  75.7*  PLT 176 208 161  --  144*   Basic  Metabolic Panel:  Recent Labs Lab 08/07/16 0206 08/08/16 2346 08/09/16 1558 08/10/16 0655  NA 141 143 140 137  K 4.0 3.7 3.7 3.9  CL 106 104 104 105  CO2 28 30 32 28  GLUCOSE 108* 138* 108* 105*  BUN 19 22* 24* 17  CREATININE 1.03 0.99 1.16 1.10  CALCIUM 9.2 9.0 8.1* 8.1*   GFR: Estimated Creatinine Clearance: 77.4 mL/min (by C-G formula based on SCr of 1.1 mg/dL). Liver Function Tests:  Recent Labs Lab 08/07/16 0206 08/08/16 2346 08/09/16 1558  AST 21 17 14*  ALT 18 17 12*  ALKPHOS 63 57 47  BILITOT 0.7 0.4 0.4  PROT 7.5 7.5 5.7*  ALBUMIN 4.0 3.9 3.2*    Recent Labs Lab 08/07/16 0206 08/08/16 2346  LIPASE 27 22   No results for input(s): AMMONIA in the last 168 hours. Coagulation Profile:  Recent Labs Lab 08/10/16 0655  INR 1.09   Cardiac Enzymes:  Recent Labs Lab 08/09/16 2256  TROPONINI <0.03   BNP (last 3 results) No results for input(s): PROBNP in the last 8760 hours. HbA1C: No results for input(s): HGBA1C in the last 72 hours. CBG: No results for input(s): GLUCAP in the last 168 hours. Lipid Profile: No results for input(s): CHOL, HDL, LDLCALC, TRIG, CHOLHDL, LDLDIRECT in the last 72 hours. Thyroid Function Tests: No results for input(s): TSH, T4TOTAL, FREET4, T3FREE, THYROIDAB in the last 72 hours. Anemia Panel: No results for input(s): VITAMINB12, FOLATE, FERRITIN, TIBC, IRON, RETICCTPCT in the last 72 hours. Sepsis Labs: No results for input(s): PROCALCITON, LATICACIDVEN in the last 168 hours.  No results found for this or any previous visit (from the past 240 hour(s)).       Radiology Studies: Dg Abdomen Acute W/chest  Result Date: 08/09/2016 CLINICAL DATA:  Mid abdominal pain, nausea, vomiting with blood in it for 2 days, history GERD, occasional smoker EXAM: DG ABDOMEN ACUTE W/ 1V CHEST COMPARISON:  09/27/2015 FINDINGS: Normal heart size, mediastinal contours, and pulmonary vascularity. LEFT upper lobe scarring stable. Minimal  tenting at LEFT diaphragm stable. Lungs otherwise clear. No pleural effusion or pneumothorax. Stomach distended by food/fluid. Coils noted periduodenal question related to ulcer disease. Nonobstructive bowel gas pattern. Scattered gas and stool in colon. No bowel dilatation, bowel wall thickening, or free air. Osseous structures unremarkable. LEFT pelvic phleboliths noted. No urinary tract calcification. IMPRESSION: LEFT lung scarring without acute infiltrate. Nonobstructive bowel gas pattern with stomach distended by food/fluid. Electronically Signed   By: Ulyses SouthwardMark  Boles M.D.   On: 08/09/2016 08:57        Scheduled Meds: . [facility][MAR  Hold] folic acid  1 mg Oral Daily  . [MAR Hold] multivitamin with minerals  1 tablet Oral Daily  . [MAR Hold] pantoprazole (PROTONIX) IV  40 mg Intravenous Q12H  . [MAR Hold] pantoprazole (PROTONIX) IV  40 mg Intravenous BID  . [MAR Hold] thiamine  100 mg Oral Daily   Or  . [MAR Hold] thiamine  100 mg Intravenous Daily   Continuous Infusions: . 0.9 % NaCl with KCl 20 mEq / L 75 mL/hr at 08/09/16 2139  . lactated ringers       LOS: 0 days    Time spent: 35 mins     Ankit Joline Maxcy, MD [facility] Pager [phone]   If 7PM-7AM, please contact night-coverage [email or web address] Password [email or web address] 08/10/2016, 10:03 AM

## 2016-08-10 NOTE — Anesthesia Preprocedure Evaluation (Signed)
Anesthesia Evaluation  Patient identified by MRN, date of birth, ID band Patient awake    Reviewed: Allergy & Precautions, NPO status , Patient's Chart, lab work & pertinent test results  Airway Mallampati: II  TM Distance: >3 FB Neck ROM: Full    Dental  (+) Poor Dentition, Missing, Dental Advisory Given   Pulmonary pneumonia, resolved, Current Smoker,    Pulmonary exam normal breath sounds clear to auscultation       Cardiovascular Exercise Tolerance: Good hypertension, Normal cardiovascular exam Rhythm:Regular Rate:Normal  HTN on admission- not on any medications   Neuro/Psych PSYCHIATRIC DISORDERS Hx/o personality disorder   GI/Hepatic PUD, GERD  Medicated and Controlled,(+)     substance abuse  alcohol use, cocaine use and marijuana use, Hx/o polysubstance abuse   Endo/Other  negative endocrine ROS  Renal/GU negative Renal ROS  negative genitourinary   Musculoskeletal negative musculoskeletal ROS (+)   Abdominal Normal abdominal exam  (+)   Peds  Hematology  (+) anemia ,   Anesthesia Other Findings   Reproductive/Obstetrics                               Chemistry      Component Value Date/Time   NA 137 08/10/2016 0655   K 3.9 08/10/2016 0655   CL 105 08/10/2016 0655   CO2 28 08/10/2016 0655   BUN 17 08/10/2016 0655   CREATININE 1.10 08/10/2016 0655      Component Value Date/Time   CALCIUM 8.1 (L) 08/10/2016 0655   ALKPHOS 47 08/09/2016 1558   AST 14 (L) 08/09/2016 1558   ALT 12 (L) 08/09/2016 1558   BILITOT 0.4 08/09/2016 1558     Lab Results  Component Value Date   WBC 6.2 08/10/2016   HGB 9.6 (L) 08/10/2016   HCT 30.5 (L) 08/10/2016   MCV 75.7 (L) 08/10/2016   PLT 144 (L) 08/10/2016  EKG: normal sinus rhythm, prolonged QT interval.-09/2014   Anesthesia Physical  Anesthesia Plan  ASA: II  Anesthesia Plan: MAC   Post-op Pain Management:    Induction:  Intravenous  Airway Management Planned: Natural Airway and Nasal Cannula  Additional Equipment:   Intra-op Plan:   Post-operative Plan:   Informed Consent: I have reviewed the patients History and Physical, chart, labs and discussed the procedure including the risks, benefits and alternatives for the proposed anesthesia with the patient or authorized representative who has indicated his/her understanding and acceptance.     Plan Discussed with: CRNA and Surgeon  Anesthesia Plan Comments:         Anesthesia Quick Evaluation

## 2016-08-11 DIAGNOSIS — F1010 Alcohol abuse, uncomplicated: Secondary | ICD-10-CM

## 2016-08-11 MED ORDER — BIS SUBCIT-METRONID-TETRACYC 140-125-125 MG PO CAPS: 3.0000 | ORAL_CAPSULE | Freq: Three times a day (TID) | ORAL | 0 refills | Status: AC

## 2016-08-11 MED ORDER — PANTOPRAZOLE SODIUM 40 MG PO TBEC: 40.0000 mg | DELAYED_RELEASE_TABLET | Freq: Two times a day (BID) | ORAL | 3 refills | Status: DC

## 2016-08-11 MED ORDER — BIS SUBCIT-METRONID-TETRACYC 140-125-125 MG PO CAPS: 3.0000 | ORAL_CAPSULE | Freq: Three times a day (TID) | ORAL | Status: DC

## 2016-08-11 NOTE — Progress Notes (Signed)
Pharmacy IV to PO conversion  The patient is ordered Thiamine by the intravenous route.  Based on criteria approved by the Pharmacy and Therapeutics Committee and the Medical Executive Committee, the medication is being converted to the equivalent oral dose form.   No active GI bleeding or impaired absorption  Not s/p esophagectomy  Documented ability to take oral medications for > 24 hr  Plan to continue treatment for at least 1 day  If you have any questions about this conversion, please contact the Pharmacy Department (ext [phone]).  Thank you.  Bernadene Personrew Jamiah Recore, PharmD Pager: [phone] 08/11/2016, 12:29 PM

## 2016-08-11 NOTE — Discharge Summary (Addendum)
Physician Discharge Summary  [name] ZOX:[id number] DOB: Jan 31, 1961 DOA: 08/09/2016  PCP: [name], MD  Admit date: 08/09/2016 Discharge date: 08/11/2016   Recommendations for Outpatient Follow-Up:   Soft low residue diet AVOID ASA/NSAIDS NO ALCOHOL NO COCAINE OR OTHER STREET DRUGS Take medications as prescribed  1. BID PPI x 12 weeks then daily for life   Discharge Diagnosis:   Active Problems:   Acute upper GI bleed   Coffee ground emesis   Upper GI bleed   Gastritis without bleeding   Hematemesis without nausea   Alcohol abuse   Acute esophagitis   Multiple gastric ulcers   Discharge disposition:  Home- care giver updated  Discharge Condition: Improved.  Diet recommendation: see above  Wound care: None.   History of Present Illness:   [name] is a 56 y.o. male with medical history of gastric and duodenal ulcer disease, alcohol abuse, medical noncompliance presents with hematemesis/coffee grounds that began on 08/06/2016. The patient states that he has not been taking his medications as prescribed. He states that he continues to drink beer on a daily basis, but is not forthcoming regarding the amount. His last drink was on 08/06/2016. Associated with his hematemesis, the patient has had epigastric abdominal pain. On the morning of 08/09/2016, he continued to have emesis. As a result, the patient presented to the emergency department. The patient has had an extensive history of upper GI bleed secondary to duodenal ulcers and esophagitis. He has had numerous EGDs with hemostatic therapies dating back to 2014. In October 2014, the patient INR embolization of the gastroduodenal artery. On 10/10/2014, the patient had expiratory laparotomy with gastrorrhaphy to oversew a bleeding duodenal ulcer. Most recently, the patient had an EGD on 04/03/2016 which showed Grade D esophagitis with ulceration. Non-bleeding gastric ulcers with pigmented material and visible  sutrures. Partial GOO due to extensive peptic ulcers. Multiple, pigmented, oozing duodenal ulcers. He was given a course of Pylera after last EGD which he reports he never took. He was given protonix 40mg  daily but states he took it only once.  He has presented to the emergency department multiple times secondary to recurrent epigastric pain and vomiting.  In the emergency department, the patient failed solid food challenge 2. As a result, admission was advised. The patient was afebrile and hemodynamically stable. BMP and hepatic panel unremarkable. He is saturating well on room air. WBC was 12.3 with stable hemoglobin of 12.9. The patient was given IV PPI and multiple doses of Zofran.    Hospital Course by Problem:  Hematemesis/upper GI bleed S/p EGD with LA Grade D reflux and acute esophagitis.                           - Gastritis with severe peptic ulcer disease in the distal antrum, prepyloric stomach and pyloric                            channel.                           - Duodenitis.                           - Normal second portion of the duodenum. Recommendation:                                      -  Soft diet and low residue diet.                           - Continue present medications. Twice daily PPI for                            at least 12 weeks, then once daily indefinitely.                           - No aspirin, ibuprofen, naproxen, or other                            non-steroidal anti-inflammatory drugs.                           - Would recommend restarting Pylera for empiric H.                            pylori therapy                           - Alcohol avoidance as this worsens gastritis and                            ulcer disease   Anemia: likely due the blood loss - no need for transfusion.  -Baseline appears to be around 12.0  Alcohol abuse -no sign of withdrawal -Urine drug screen Positive for Barb and Cocaine   Peptic ulcer disease -cont PPI  as above  Leukocytosis -resolved   Medical Consultants:    GI   Discharge Exam:   Vitals:   08/10/16 2118 08/11/16 0422  BP: (!) 161/77 (!) 141/83  Pulse: 77 78  Resp: 18 17  Temp: 99.2 F (37.3 C) 99 F (37.2 C)   Vitals:   08/10/16 1050 08/10/16 1536 08/10/16 2118 08/11/16 0422  BP: 127/72 (!) 151/95 (!) 161/77 (!) 141/83  Pulse: 60 82 77 78  Resp: (!) 21 20 18 17   Temp:  98.8 F (37.1 C) 99.2 F (37.3 C) 99 F (37.2 C)  TempSrc:  Oral Oral Oral  SpO2: 99% 100% 99% 99%  Weight:      Height:        Gen:  Anxious to leave   The results of significant diagnostics from this hospitalization (including imaging, microbiology, ancillary and laboratory) are listed below for reference.     Procedures and Diagnostic Studies:   Dg Abdomen Acute W/chest  Result Date: 08/09/2016 CLINICAL DATA:  Mid abdominal pain, nausea, vomiting with blood in it for 2 days, history GERD, occasional smoker EXAM: DG ABDOMEN ACUTE W/ 1V CHEST COMPARISON:  09/27/2015 FINDINGS: Normal heart size, mediastinal contours, and pulmonary vascularity. LEFT upper lobe scarring stable. Minimal tenting at LEFT diaphragm stable. Lungs otherwise clear. No pleural effusion or pneumothorax. Stomach distended by food/fluid. Coils noted periduodenal question related to ulcer disease. Nonobstructive bowel gas pattern. Scattered gas and stool in colon. No bowel dilatation, bowel wall thickening, or free air. Osseous structures unremarkable. LEFT pelvic phleboliths noted. No urinary tract calcification. IMPRESSION: LEFT lung scarring without acute infiltrate. Nonobstructive bowel gas pattern with stomach distended by food/fluid. Electronically Signed   By: Loraine Leriche  Tyron Russell M.D.   On: 08/09/2016 08:57     Labs:   Basic Metabolic Panel:  Recent Labs Lab 08/07/16 0206 08/08/16 2346 08/09/16 1558 08/10/16 0655  NA 141 143 140 137  K 4.0 3.7 3.7 3.9  CL 106 104 104 105  CO2 28 30 32 28  GLUCOSE 108* 138* 108*  105*  BUN 19 22* 24* 17  CREATININE 1.03 0.99 1.16 1.10  CALCIUM 9.2 9.0 8.1* 8.1*   GFR Estimated Creatinine Clearance: 77.4 mL/min (by C-G formula based on SCr of 1.1 mg/dL). Liver Function Tests:  Recent Labs Lab 08/07/16 0206 08/08/16 2346 08/09/16 1558  AST 21 17 14*  ALT 18 17 12*  ALKPHOS 63 57 47  BILITOT 0.7 0.4 0.4  PROT 7.5 7.5 5.7*  ALBUMIN 4.0 3.9 3.2*    Recent Labs Lab 08/07/16 0206 08/08/16 2346  LIPASE 27 22   No results for input(s): AMMONIA in the last 168 hours. Coagulation profile  Recent Labs Lab 08/10/16 0655  INR 1.09    CBC:  Recent Labs Lab 08/07/16 0206 08/08/16 2346 08/09/16 1527 08/09/16 2256 08/10/16 0655 08/10/16 1511  WBC 17.1* 12.3* 10.4  --  6.2 6.0  NEUTROABS  --   --  7.2  --   --   --   HGB 12.6* 12.9* 10.3* 10.2* 9.6* 10.3*  HCT 39.4 39.7 33.2* 32.1* 30.5* 32.5*  MCV 74.8* 74.9* 75.3*  --  75.7* 76.3*  PLT 176 208 161  --  144* 177   Cardiac Enzymes:  Recent Labs Lab 08/09/16 2256  TROPONINI <0.03   BNP: Invalid input(s): POCBNP CBG: No results for input(s): GLUCAP in the last 168 hours. D-Dimer No results for input(s): DDIMER in the last 72 hours. Hgb A1c No results for input(s): HGBA1C in the last 72 hours. Lipid Profile No results for input(s): CHOL, HDL, LDLCALC, TRIG, CHOLHDL, LDLDIRECT in the last 72 hours. Thyroid function studies No results for input(s): TSH, T4TOTAL, T3FREE, THYROIDAB in the last 72 hours.  Invalid input(s): FREET3 Anemia work up No results for input(s): VITAMINB12, FOLATE, FERRITIN, TIBC, IRON, RETICCTPCT in the last 72 hours. Microbiology No results found for this or any previous visit (from the past 240 hour(s)).   Discharge Instructions:   Discharge Instructions    Discharge instructions    Complete by:  As directed    Soft low residue diet AVOID ASA/NSAIDS NO ALCOHOL NO COCAINE OR OTHER STREET DRUGS Take medications as prescribed   Increase activity slowly     Complete by:  As directed      Allergies as of 08/11/2016      Reactions   Nsaids Other (See Comments)   BLEEDING ULCER   Chocolate Nausea And Vomiting   Peanut-containing Drug Products Nausea And Vomiting   Spinach Nausea And Vomiting      Medication List    STOP taking these medications   omeprazole 20 MG capsule Commonly known as:  PRILOSEC     TAKE these medications   acetaminophen 500 MG tablet Commonly known as:  TYLENOL Take 500 mg by mouth every 6 (six) hours as needed.   bismuth-metronidazole-tetracycline 140-125-125 MG capsule Commonly known as:  PYLERA Take 3 capsules by mouth 4 (four) times daily -  before meals and at bedtime.   folic acid 1 MG tablet Commonly known as:  FOLVITE Take 1 tablet (1 mg total) by mouth daily.   multivitamin tablet Take 1 tablet by mouth daily.   ondansetron 4 MG  disintegrating tablet Commonly known as:  ZOFRAN ODT Take 1 tablet (4 mg total) by mouth every 8 (eight) hours as needed for nausea or vomiting.   pantoprazole 40 MG tablet Commonly known as:  PROTONIX Take 1 tablet (40 mg total) by mouth 2 (two) times daily. What changed:  when to take this   thiamine 100 MG tablet Commonly known as:  VITAMIN B-1 Take 1 tablet (100 mg total) by mouth daily.      Follow-up Information    OSEI-BONSU,GEORGE, MD Follow up in 1 week(s).   Specialty:  Internal Medicine Why:  outpatient GI follow up Contact information: [address] [place] [id number] [phone]            Time coordinating discharge: 35 min Signed:  Kardell Virgil U Kimyatta Frazier   [facility] 08/11/2016, 1:12 PM

## 2016-08-11 NOTE — Progress Notes (Signed)
Patient given discharge instructions, and verbalized an understanding of all discharge instructions.  Patient agrees with discharge plan, and is being discharged in stable medical condition.  Patient given bus pass for transportation assistance.

## 2016-08-12 ENCOUNTER — Encounter (HOSPITAL_COMMUNITY): Payer: Self-pay | Admitting: Internal Medicine

## 2017-01-06 NOTE — Addendum Note (Signed)
Addendum  created 01/06/17 0902 by Modine Oppenheimer, MD   Sign clinical note    

## 2017-06-25 ENCOUNTER — Encounter (HOSPITAL_COMMUNITY): Payer: Self-pay | Admitting: *Deleted

## 2017-06-25 DIAGNOSIS — I10 Essential (primary) hypertension: Secondary | ICD-10-CM | POA: Insufficient documentation

## 2017-06-25 DIAGNOSIS — I878 Other specified disorders of veins: Secondary | ICD-10-CM | POA: Diagnosis not present

## 2017-06-25 DIAGNOSIS — Z91010 Allergy to peanuts: Secondary | ICD-10-CM | POA: Insufficient documentation

## 2017-06-25 DIAGNOSIS — F1210 Cannabis abuse, uncomplicated: Secondary | ICD-10-CM | POA: Insufficient documentation

## 2017-06-25 DIAGNOSIS — F17210 Nicotine dependence, cigarettes, uncomplicated: Secondary | ICD-10-CM | POA: Insufficient documentation

## 2017-06-25 DIAGNOSIS — R609 Edema, unspecified: Secondary | ICD-10-CM | POA: Insufficient documentation

## 2017-06-25 DIAGNOSIS — R2243 Localized swelling, mass and lump, lower limb, bilateral: Secondary | ICD-10-CM | POA: Diagnosis present

## 2017-06-25 NOTE — ED Triage Notes (Signed)
The pt is c/o bi-lateral leg pain and swelling for months  He has crusted areas on his legs with sl redness that does not appear new

## 2017-06-26 ENCOUNTER — Emergency Department (HOSPITAL_COMMUNITY)
Admission: EM | Admit: 2017-06-26 | Discharge: 2017-06-26 | Disposition: A | Discharge status: Home / Self Care | Payer: Medicaid Other | Attending: Emergency Medicine | Admitting: Emergency Medicine

## 2017-06-26 DIAGNOSIS — I878 Other specified disorders of veins: Secondary | ICD-10-CM

## 2017-06-26 DIAGNOSIS — R609 Edema, unspecified: Secondary | ICD-10-CM

## 2017-06-26 LAB — I-STAT CHEM 8, ED
BUN: 27 mg/dL — ABNORMAL HIGH (ref 6–20)
CALCIUM ION: 1.15 mmol/L (ref 1.15–1.40)
Chloride: 107 mmol/L (ref 101–111)
Creatinine, Ser: 1.3 mg/dL — ABNORMAL HIGH (ref 0.61–1.24)
Glucose, Bld: 136 mg/dL — ABNORMAL HIGH (ref 65–99)
HCT: 42 % (ref 39.0–52.0)
Hemoglobin: 14.3 g/dL (ref 13.0–17.0)
Potassium: 3.8 mmol/L (ref 3.5–5.1)
SODIUM: 142 mmol/L (ref 135–145)
TCO2: 25 mmol/L (ref 22–32)

## 2017-06-26 NOTE — ED Provider Notes (Signed)
[facility] EMERGENCY DEPARTMENT Provider Note   CSN: [id number] Arrival date & time: 06/25/17  2109     History   Chief Complaint Chief Complaint  Patient presents with  . Leg Pain    HPI [name] is a 56 y.o. male.  Bilateral lower extremity swelling for the past "few months". No pain, just tightness in both legs. Symptoms worsen throughout the day and is better on waking in the morning. No chest pain or SOB. No history of CHF, fever or cough.   The history is provided by the patient. No language interpreter was used.    Past Medical History:  Diagnosis Date  . Abdominal pain, other specified site   . Abnormal CT scan, esophagus 03/2012  . GERD (gastroesophageal reflux disease)   . Substance abuse (HCC) 03/2012   tox screen positive for cocaine.     Patient Active Problem List   Diagnosis Date Noted  . Acute esophagitis   . Multiple gastric ulcers   . Coffee ground emesis 08/09/2016  . Upper GI bleed 08/09/2016  . Alcohol abuse 08/09/2016  . Gastritis without bleeding   . Hematemesis without nausea   . History of alcohol abuse   . 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 Duodenitis 04/02/2016  . GI bleed 04/02/2016  . Personality disorder (HCC) 10/20/2014  . Acute upper GI bleed   . Ileus, postoperative (HCC) 10/15/2014  . Aspiration pneumonia (HCC) 10/14/2014  . Polysubstance abuse (HCC) 10/09/2014  . UGIB (upper gastrointestinal bleed) 10/08/2014  . Leukocytosis 04/30/2013  . Microcytic anemia 04/30/2013  . Hematemesis 08/10/2012  . Anemia due to GI blood loss 08/10/2012  . Acute respiratory failure (HCC) 08/10/2012  . Hypertension 08/10/2012  . Duodenal ulcer with hemorrhage s/p oversew 10/11/2014 08/10/2012  . Encephalopathy acute 08/10/2012    Past Surgical History:  Procedure Laterality Date  . ESOPHAGOGASTRODUODENOSCOPY  08/10/2012   Procedure: ESOPHAGOGASTRODUODENOSCOPY (EGD);  Surgeon: [name], MD;  Location: [facility]  ENDOSCOPY;  Service: Endoscopy;  Laterality: N/A;  will be done in room 1   . ESOPHAGOGASTRODUODENOSCOPY Left 05/01/2013   Procedure: ESOPHAGOGASTRODUODENOSCOPY (EGD);  Surgeon: [name], MD;  Location: [facility] ENDOSCOPY;  Service: Endoscopy;  Laterality: Left;  . ESOPHAGOGASTRODUODENOSCOPY N/A 05/11/2013   Procedure: ESOPHAGOGASTRODUODENOSCOPY (EGD);  Surgeon: [name], MD;  Location: [facility] ENDOSCOPY;  Service: Endoscopy;  Laterality: N/A;  . ESOPHAGOGASTRODUODENOSCOPY N/A 10/06/2014   Procedure: ESOPHAGOGASTRODUODENOSCOPY (EGD);  Surgeon: [name], MD;  Location: [facility] ENDOSCOPY;  Service: Endoscopy;  Laterality: N/A;  . ESOPHAGOGASTRODUODENOSCOPY N/A 10/09/2014   Procedure: ESOPHAGOGASTRODUODENOSCOPY (EGD);  Surgeon: [name], MD;  Location: [facility] ENDOSCOPY;  Service: Gastroenterology;  Laterality: N/A;  Bedside  . ESOPHAGOGASTRODUODENOSCOPY N/A 04/03/2016   Procedure: ESOPHAGOGASTRODUODENOSCOPY (EGD);  Surgeon: [name], MD;  Location: [facility] ORS;  Service: Gastroenterology;  Laterality: N/A;  . ESOPHAGOGASTRODUODENOSCOPY (EGD) WITH PROPOFOL N/A 08/10/2016   Procedure: ESOPHAGOGASTRODUODENOSCOPY (EGD) WITH PROPOFOL;  Surgeon: [name], MD;  Location: [facility] ENDOSCOPY;  Service: Gastroenterology;  Laterality: N/A;  . LAPAROTOMY  08/10/2012   Procedure: EXPLORATORY LAPAROTOMY;  Surgeon: [name], MD;  Location: [facility] OR;  Service: General;  Laterality: N/A;  . LAPAROTOMY N/A 10/10/2014   Procedure: EXPLORATORY LAPAROTOMY WITH GASTROPATHY AND OVER SEWEN DUODENAL ULCER REGION ;  Surgeon: [name], MD;  Location: [facility] ORS;  Service: General;  Laterality: N/A;  . undescended testicle     on right. noted on 03/2012 CT scan.  no surgery referral.  Home Medications    Prior to Admission medications   Medication Sig Start Date End Date Taking? Authorizing Provider  acetaminophen (TYLENOL) 500 MG tablet Take 500 mg by mouth every 6 (six) hours as needed.    [provider]    bismuth-metronidazole-tetracycline [facility](PYLERA) [phone] MG capsule Take 3 capsules by mouth 4 (four) times daily -  before meals and at bedtime. 08/11/16   Joseph ArtVann, Jessica U, DO  folic acid (FOLVITE) 1 MG tablet Take 1 tablet (1 mg total) by mouth daily. Patient not taking: Reported on 07/30/2016 04/06/16   Edsel PetrinMikhail, Maryann, DO  Multiple Vitamins-Minerals (MULTIVITAMIN) tablet Take 1 tablet by mouth daily. Patient not taking: Reported on 08/08/2016 04/06/16   Edsel PetrinMikhail, Maryann, DO  ondansetron (ZOFRAN ODT) 4 MG disintegrating tablet Take 1 tablet (4 mg total) by mouth every 8 (eight) hours as needed for nausea or vomiting. 08/09/16   Horton, Mayer Maskerourtney F, MD  pantoprazole (PROTONIX) 40 MG tablet Take 1 tablet (40 mg total) by mouth 2 (two) times daily. 08/11/16   Joseph ArtVann, Jessica U, DO  thiamine (VITAMIN B-1) 100 MG tablet Take 1 tablet (100 mg total) by mouth daily. Patient not taking: Reported on 07/30/2016 04/06/16   Edsel PetrinMikhail, Maryann, DO    Family History History reviewed. No pertinent family history.  Social History Social History   Tobacco Use  . Smoking status: Light Tobacco Smoker    Packs/day: 1.00    Years: 34.00    Pack years: 34.00  . Smokeless tobacco: Never Used  Substance Use Topics  . Alcohol use: Yes    Comment: 40oz beer/day  . Drug use: Yes    Types: Marijuana    Comment: occ - "$5 bag" "only when I got money"     Allergies   Nsaids; Chocolate; Peanut-containing drug products; and Spinach   Review of Systems Review of Systems  Constitutional: Negative for chills and fever.  HENT: Negative.   Respiratory: Negative.   Cardiovascular: Negative.   Gastrointestinal: Negative.   Musculoskeletal:       See HPI.  Skin: Positive for color change (bilateral LE).  Neurological: Negative.      Physical Exam Updated Vital Signs BP (!) 183/108 (BP Location: Right Arm) Comment: pt would not hold arm still  Pulse 78   Temp 98.1 F (36.7 C) (Oral)   Resp 16   Ht 5\' 9"  (1.753  m)   Wt 74.8 kg (165 lb)   SpO2 96%   BMI 24.37 kg/m   Physical Exam  Constitutional: He appears well-developed and well-nourished.  HENT:  Head: Normocephalic.  Neck: Normal range of motion. Neck supple.  Cardiovascular: Normal rate and regular rhythm.  Pulmonary/Chest: Effort normal and breath sounds normal.  Abdominal: Soft. Bowel sounds are normal. There is no tenderness. There is no rebound and no guarding.  Musculoskeletal: Normal range of motion. He exhibits edema (Bilateral edema and changes of chronic venous stasis).  No skin breakdown.  Neurological: He is alert. No cranial nerve deficit.  Skin: Skin is warm and dry. No rash noted.  Psychiatric: He has a normal mood and affect.     ED Treatments / Results  Labs (all labs ordered are listed, but only abnormal results are displayed) Labs Reviewed - No data to display  EKG  EKG Interpretation None       Radiology No results found.  Procedures Procedures (including critical care time)  Medications Ordered in ED Medications - No data to display   Initial Impression /  Assessment and Plan / ED Course  I have reviewed the triage vital signs and the nursing notes.  Pertinent labs & imaging results that were available during my care of the patient were reviewed by me and considered in my medical decision making (see chart for details).     Patient presents for evaluation of chronic bilateral lower extremities. No SOB or CP.   Exam is consistent with chronic venous stasis. No evidence of cellulitis or heart failure.   Will recommend follow up with his doctor for further management.   Final Clinical Impressions(s) / ED Diagnoses   Final diagnoses:  None   1. Peripheral edema 2. Venous stasis  ED Discharge Orders    None       Danne HarborUpstill, Reynard Christoffersen, PA-C 06/26/17 [id number]    Dione BoozeGlick, David, MD 06/26/17 [phone]

## 2017-07-23 ENCOUNTER — Emergency Department (HOSPITAL_COMMUNITY): Payer: Medicaid Other

## 2017-07-23 ENCOUNTER — Emergency Department (HOSPITAL_COMMUNITY)
Admission: EM | Admit: 2017-07-23 | Discharge: 2017-07-23 | Disposition: A | Discharge status: Home / Self Care | Payer: Medicaid Other | Attending: Emergency Medicine | Admitting: Emergency Medicine

## 2017-07-23 ENCOUNTER — Other Ambulatory Visit: Payer: Self-pay

## 2017-07-23 ENCOUNTER — Encounter (HOSPITAL_COMMUNITY): Payer: Self-pay

## 2017-07-23 DIAGNOSIS — F17210 Nicotine dependence, cigarettes, uncomplicated: Secondary | ICD-10-CM | POA: Insufficient documentation

## 2017-07-23 DIAGNOSIS — I10 Essential (primary) hypertension: Secondary | ICD-10-CM | POA: Insufficient documentation

## 2017-07-23 DIAGNOSIS — R0789 Other chest pain: Secondary | ICD-10-CM | POA: Diagnosis not present

## 2017-07-23 LAB — BASIC METABOLIC PANEL
ANION GAP: 7 (ref 5–15)
BUN: 25 mg/dL — AB (ref 6–20)
CALCIUM: 8.8 mg/dL — AB (ref 8.9–10.3)
CO2: 23 mmol/L (ref 22–32)
Chloride: 107 mmol/L (ref 101–111)
Creatinine, Ser: 1.19 mg/dL (ref 0.61–1.24)
GFR calc Af Amer: >60 mL/min (ref >60)
Glucose, Bld: 102 mg/dL — ABNORMAL HIGH (ref 65–99)
POTASSIUM: 4 mmol/L (ref 3.5–5.1)
SODIUM: 137 mmol/L (ref 135–145)

## 2017-07-23 LAB — CBC
HEMATOCRIT: 46 % (ref 39.0–52.0)
Hemoglobin: 15.1 g/dL (ref 13.0–17.0)
MCH: 27.6 pg (ref 26.0–34.0)
MCHC: 32.8 g/dL (ref 30.0–36.0)
MCV: 84.1 fL (ref 78.0–100.0)
Platelets: 118 10*3/uL — ABNORMAL LOW (ref 150–400)
RBC: 5.47 MIL/uL (ref 4.22–5.81)
RDW: 14.7 % (ref 11.5–15.5)
WBC: 6.9 10*3/uL (ref 4.0–10.5)

## 2017-07-23 LAB — I-STAT TROPONIN, ED
TROPONIN I, POC: 0 ng/mL (ref 0.00–0.08)
TROPONIN I, POC: 0 ng/mL (ref 0.00–0.08)

## 2017-07-23 NOTE — Discharge Instructions (Signed)
He was seen today for chest pain.  Your workup is reassuring.  Follow-up with your primary physician and cardiology for further workup.

## 2017-07-23 NOTE — ED Triage Notes (Signed)
Pt states that he began to have CP around midnight, central with some SOB, denies radiation, n/v

## 2017-07-23 NOTE — ED Provider Notes (Signed)
[facility] Provider Note   CSN: [id number] Arrival date & time: 07/23/17  0104     History   Chief Complaint Chief Complaint  Patient presents with  . Chest Pain    HPI Einar GradRandy Cohick is a 56 y.o. male.  HPI  This is a 56 year old male with a history of chin and substance abuse who presents with chest pain.  Patient reports onset of chest pain this evening.  He states "I am not sure if it was my reflux."  It was after eating.  It lasted for minutes.  Patient is otherwise unable to characterize the pain.  He is currently pain-free.  Reports some associated shortness of breath.  Denies nausea, vomiting, abdominal pain, fevers.  Denies any history of diabetes or coronary artery disease.  Does have a history of hypertension.  Past Medical History:  Diagnosis Date  . Abdominal pain, other specified site   . Abnormal CT scan, esophagus 03/2012  . GERD (gastroesophageal reflux disease)   . Substance abuse (HCC) 03/2012   tox screen positive for cocaine.     Patient Active Problem List   Diagnosis Date Noted  . Acute esophagitis   . Multiple gastric ulcers   . Coffee ground emesis 08/09/2016  . Upper GI bleed 08/09/2016  . Alcohol abuse 08/09/2016  . Gastritis without bleeding   . Hematemesis without nausea   . History of alcohol abuse   . 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 Duodenitis 04/02/2016  . GI bleed 04/02/2016  . Personality disorder (HCC) 10/20/2014  . Acute upper GI bleed   . Ileus, postoperative (HCC) 10/15/2014  . Aspiration pneumonia (HCC) 10/14/2014  . Polysubstance abuse (HCC) 10/09/2014  . UGIB (upper gastrointestinal bleed) 10/08/2014  . Leukocytosis 04/30/2013  . Microcytic anemia 04/30/2013  . Hematemesis 08/10/2012  . Anemia due to GI blood loss 08/10/2012  . Acute respiratory failure (HCC) 08/10/2012  . Hypertension 08/10/2012  . Duodenal ulcer with hemorrhage s/p oversew 10/11/2014 08/10/2012  .  Encephalopathy acute 08/10/2012    Past Surgical History:  Procedure Laterality Date  . ESOPHAGOGASTRODUODENOSCOPY  08/10/2012   Procedure: ESOPHAGOGASTRODUODENOSCOPY (EGD);  Surgeon: Rachael Feeaniel P Jacobs, MD;  [facility];  Service: Endoscopy;  Laterality: N/A;  will be done in room 1   . ESOPHAGOGASTRODUODENOSCOPY Left 05/01/2013   Procedure: ESOPHAGOGASTRODUODENOSCOPY (EGD);  Surgeon: Charna ElizabethJyothi Mann, MD;  Location: [facility] ENDOSCOPY;  Service: Endoscopy;  Laterality: Left;  . ESOPHAGOGASTRODUODENOSCOPY N/A 05/11/2013   Procedure: ESOPHAGOGASTRODUODENOSCOPY (EGD);  Surgeon: Graylin ShiverSalem F Ganem, MD;  [facility];  Service: Endoscopy;  Laterality: N/A;  . ESOPHAGOGASTRODUODENOSCOPY N/A 10/06/2014   Procedure: ESOPHAGOGASTRODUODENOSCOPY (EGD);  Surgeon: Beverley FiedlerJay M Pyrtle, MD;  [facility];  Service: Endoscopy;  Laterality: N/A;  . ESOPHAGOGASTRODUODENOSCOPY N/A 10/09/2014   Procedure: ESOPHAGOGASTRODUODENOSCOPY (EGD);  Surgeon: Beverley FiedlerJay M Pyrtle, MD;  [facility];  Service: Gastroenterology;  Laterality: N/A;  Bedside  . ESOPHAGOGASTRODUODENOSCOPY N/A 04/03/2016   Procedure: ESOPHAGOGASTRODUODENOSCOPY (EGD);  Surgeon: Napoleon FormKavitha V Nandigam, MD;  Location: [facility] ORS;  Service: Gastroenterology;  Laterality: N/A;  . ESOPHAGOGASTRODUODENOSCOPY (EGD) WITH PROPOFOL N/A 08/10/2016   Procedure: ESOPHAGOGASTRODUODENOSCOPY (EGD) WITH PROPOFOL;  Surgeon: Beverley FiedlerJay M Pyrtle, MD;  Location: [facility] ENDOSCOPY;  Service: Gastroenterology;  Laterality: N/A;  . LAPAROTOMY  08/10/2012   Procedure: EXPLORATORY LAPAROTOMY;  Surgeon: Cherylynn RidgesJames O Wyatt, MD;  Location: [facility] OR;  Service: General;  Laterality: N/A;  . LAPAROTOMY N/A 10/10/2014   Procedure: EXPLORATORY LAPAROTOMY WITH GASTROPATHY AND OVER SEWEN DUODENAL ULCER REGION ;  Surgeon: [name], MD;  Location: [facility] ORS;  Service: General;  Laterality: N/A;  . undescended testicle     on right. noted on 03/2012 CT scan.  no surgery referral.        Home Medications      Prior to Admission medications   Medication Sig Start Date End Date Taking? Authorizing Provider  bismuth-metronidazole-tetracycline [facility]) [phone] MG capsule Take 3 capsules by mouth 4 (four) times daily -  before meals and at bedtime. Patient not taking: Reported on 06/26/2017 08/11/16   Joseph Art, DO  folic acid (FOLVITE) 1 MG tablet Take 1 tablet (1 mg total) by mouth daily. Patient not taking: Reported on 07/30/2016 04/06/16   [name], DO  Multiple Vitamins-Minerals (MULTIVITAMIN) tablet Take 1 tablet by mouth daily. Patient not taking: Reported on 08/08/2016 04/06/16   [name], DO  ondansetron (ZOFRAN ODT) 4 MG disintegrating tablet Take 1 tablet (4 mg total) by mouth every 8 (eight) hours as needed for nausea or vomiting. Patient not taking: Reported on 06/26/2017 08/09/16   Decklyn Hyder, Mayer Masker, MD  pantoprazole (PROTONIX) 40 MG tablet Take 1 tablet (40 mg total) by mouth 2 (two) times daily. Patient not taking: Reported on 06/26/2017 08/11/16   Joseph Art, DO  thiamine (VITAMIN B-1) 100 MG tablet Take 1 tablet (100 mg total) by mouth daily. Patient not taking: Reported on 07/30/2016 04/06/16   [name], DO    Family History No family history on file.  Social History Social History   Tobacco Use  . Smoking status: Light Tobacco Smoker    Packs/day: 1.00    Years: 34.00    Pack years: 34.00  . Smokeless tobacco: Never Used  Substance Use Topics  . Alcohol use: Yes    Comment: 40oz beer/day  . Drug use: Yes    Types: Marijuana    Comment: occ - "$5 bag" "only when I got money"     Allergies   Nsaids; Chocolate; Peanut-containing drug products; and Spinach   Review of Systems Review of Systems  Constitutional: Negative for fever.  Respiratory: Positive for shortness of breath. Negative for cough.   Cardiovascular: Positive for chest pain.  Gastrointestinal: Negative for abdominal pain, nausea and vomiting.  All other systems  reviewed and are negative.    Physical Exam Updated Vital Signs BP (!) 157/112   Pulse 65   Temp (!) 97.5 F (36.4 C) (Oral)   Resp 20   SpO2 97%   Physical Exam  Constitutional: He is oriented to person, place, and time. He appears well-developed and well-nourished. He does not appear ill.  HENT:  Head: Normocephalic and atraumatic.  Eyes: Pupils are equal, round, and reactive to light.  Cardiovascular: Normal rate, regular rhythm, normal heart sounds and normal pulses.  No murmur heard. Pulmonary/Chest: Effort normal and breath sounds normal. No respiratory distress. He has no wheezes.  Abdominal: Soft. Bowel sounds are normal. There is no tenderness. There is no rebound.  Musculoskeletal: He exhibits no edema.  Lymphadenopathy:    He has no cervical adenopathy.  Neurological: He is alert and oriented to person, place, and time.  Skin: Skin is warm and dry.  Psychiatric: He has a normal mood and affect.  Nursing note and vitals reviewed.    ED Treatments / Results  Labs (all labs ordered are listed, but only abnormal results are displayed) Labs Reviewed  BASIC METABOLIC PANEL - Abnormal; Notable for the following components:  Result Value   Glucose, Bld 102 (*)    BUN 25 (*)    Calcium 8.8 (*)    All other components within normal limits  CBC - Abnormal; Notable for the following components:   Platelets 118 (*)    All other components within normal limits  I-STAT TROPONIN, ED  I-STAT TROPONIN, ED    EKG  EKG Interpretation None       Radiology Dg Chest 2 View  Result Date: 07/23/2017 CLINICAL DATA:  Chest pain EXAM: CHEST  2 VIEW COMPARISON:  Chest radiograph 08/09/2016 FINDINGS: The heart size and mediastinal contours are within normal limits. There is pulmonary vascular congestion without overt edema. No focal consolidation. The visualized skeletal structures are unremarkable. IMPRESSION: No active cardiopulmonary disease. Electronically Signed    By: Deatra RobinsonKevin  Herman M.D.   On: 07/23/2017 02:17    Procedures Procedures (including critical care time)  Medications Ordered in ED Medications - No data to display   Initial Impression / Assessment and Plan / ED Course  I have reviewed the triage vital signs and the nursing notes.  Pertinent labs & imaging results that were available during my care of the patient were reviewed by me and considered in my medical decision making (see chart for details).     Patient presents with chest pain.  Atypical in nature.  Overall nontoxic appearing.  Hypertensive on exam but otherwise vital signs are reassuring.  Initial EKG is unchanged without evidence of ischemia.  Initial troponin is negative.  Chest x-ray is reassuring.  Patient is pain-free.  Initial troponin was 3 hours ago.  Will obtain a repeat troponin.  5:58 AM On recheck, repeat troponin is negative.  Patient remains chest pain-free.  Doubt ACS.  Patient could have an element of reflux.  Follow-up with primary physician.  After history, exam, and medical workup I feel the patient has been appropriately medically screened and is safe for discharge home. Pertinent diagnoses were discussed with the patient. Patient was given return precautions.   Final Clinical Impressions(s) / ED Diagnoses   Final diagnoses:  Atypical chest pain  Essential hypertension    ED Discharge Orders    None       Shon BatonHorton, Karrisa Didio F, MD 07/23/17 [phone]

## 2017-09-07 ENCOUNTER — Emergency Department (HOSPITAL_BASED_OUTPATIENT_CLINIC_OR_DEPARTMENT_OTHER)
Admit: 2017-09-07 | Discharge: 2017-09-07 | Disposition: A | Discharge status: Home / Self Care | Payer: Medicaid Other | Attending: Emergency Medicine | Admitting: Emergency Medicine

## 2017-09-07 ENCOUNTER — Encounter (HOSPITAL_COMMUNITY): Payer: Self-pay | Admitting: Emergency Medicine

## 2017-09-07 ENCOUNTER — Emergency Department (HOSPITAL_COMMUNITY)
Admission: EM | Admit: 2017-09-07 | Discharge: 2017-09-07 | Disposition: A | Discharge status: Home / Self Care | Payer: Medicaid Other | Attending: Emergency Medicine | Admitting: Emergency Medicine

## 2017-09-07 ENCOUNTER — Other Ambulatory Visit: Payer: Self-pay

## 2017-09-07 ENCOUNTER — Emergency Department (HOSPITAL_COMMUNITY): Payer: Medicaid Other

## 2017-09-07 DIAGNOSIS — Z91010 Allergy to peanuts: Secondary | ICD-10-CM | POA: Diagnosis not present

## 2017-09-07 DIAGNOSIS — R2243 Localized swelling, mass and lump, lower limb, bilateral: Secondary | ICD-10-CM | POA: Diagnosis present

## 2017-09-07 DIAGNOSIS — R0602 Shortness of breath: Secondary | ICD-10-CM | POA: Diagnosis not present

## 2017-09-07 DIAGNOSIS — I10 Essential (primary) hypertension: Secondary | ICD-10-CM | POA: Insufficient documentation

## 2017-09-07 DIAGNOSIS — M7989 Other specified soft tissue disorders: Secondary | ICD-10-CM

## 2017-09-07 DIAGNOSIS — R609 Edema, unspecified: Secondary | ICD-10-CM

## 2017-09-07 DIAGNOSIS — F17210 Nicotine dependence, cigarettes, uncomplicated: Secondary | ICD-10-CM | POA: Insufficient documentation

## 2017-09-07 LAB — COMPREHENSIVE METABOLIC PANEL
ALK PHOS: 66 U/L (ref 38–126)
ALT: 24 U/L (ref 17–63)
ANION GAP: 5 (ref 5–15)
AST: 36 U/L (ref 15–41)
Albumin: 3.7 g/dL (ref 3.5–5.0)
BUN: 24 mg/dL — ABNORMAL HIGH (ref 6–20)
CO2: 26 mmol/L (ref 22–32)
Calcium: 8.9 mg/dL (ref 8.9–10.3)
Chloride: 108 mmol/L (ref 101–111)
Creatinine, Ser: 1.23 mg/dL (ref 0.61–1.24)
GFR calc non Af Amer: >60 mL/min (ref >60)
GLUCOSE: 119 mg/dL — AB (ref 65–99)
POTASSIUM: 4.5 mmol/L (ref 3.5–5.1)
SODIUM: 139 mmol/L (ref 135–145)
TOTAL PROTEIN: 6.8 g/dL (ref 6.5–8.1)
Total Bilirubin: 0.6 mg/dL (ref 0.3–1.2)

## 2017-09-07 LAB — CBC WITH DIFFERENTIAL/PLATELET
BASOS PCT: 0 %
Basophils Absolute: 0 10*3/uL (ref 0.0–0.1)
Eosinophils Absolute: 0.2 10*3/uL (ref 0.0–0.7)
Eosinophils Relative: 3 %
HCT: 43.4 % (ref 39.0–52.0)
Hemoglobin: 14.6 g/dL (ref 13.0–17.0)
LYMPHS ABS: 1.5 10*3/uL (ref 0.7–4.0)
Lymphocytes Relative: 29 %
MCH: 27.6 pg (ref 26.0–34.0)
MCHC: 33.6 g/dL (ref 30.0–36.0)
MCV: 82 fL (ref 78.0–100.0)
MONO ABS: 0.6 10*3/uL (ref 0.1–1.0)
MONOS PCT: 11 %
Neutro Abs: 3 10*3/uL (ref 1.7–7.7)
Neutrophils Relative %: 57 %
Platelets: 133 10*3/uL — ABNORMAL LOW (ref 150–400)
RBC: 5.29 MIL/uL (ref 4.22–5.81)
RDW: 14.5 % (ref 11.5–15.5)
WBC: 5.3 10*3/uL (ref 4.0–10.5)

## 2017-09-07 LAB — I-STAT TROPONIN, ED: TROPONIN I, POC: 0 ng/mL (ref 0.00–0.08)

## 2017-09-07 LAB — BRAIN NATRIURETIC PEPTIDE: B Natriuretic Peptide: 12.4 pg/mL (ref 0.0–100.0)

## 2017-09-07 MED ORDER — HYDROCHLOROTHIAZIDE 25 MG PO TABS: 25.0000 mg | ORAL_TABLET | Freq: Every day | ORAL | 0 refills | Status: DC

## 2017-09-07 MED ORDER — FUROSEMIDE 10 MG/ML IJ SOLN
40.0000 mg | Freq: Once | INTRAMUSCULAR | Status: AC
  Administered 2017-09-07: 40 mg via INTRAVENOUS
  Filled 2017-09-07: qty 4

## 2017-09-07 NOTE — Progress Notes (Signed)
LE venous duplex prelim: negative for DVT. Brodyn Depuy Eunice, RDMS, RVT  

## 2017-09-07 NOTE — Discharge Planning (Signed)
Ryan Monds J. Lucretia RoersWood, RN, BSN, [place] [phone] [facility] with [facility].  Pt has [employer] with an assigned PCP.  EDCM explained to pt that she must contact DSS to have PCP changed in order to be seen by another MD.    [facility]  Address: [address] [place], [place] [id number]

## 2017-09-07 NOTE — ED Triage Notes (Signed)
[facility] with complaints of lower extremity edema and pain for the past 2 weeks. Patient from home. BP 182/121. Patient states he lost his discharge papers from his last [facility] visit to obtain a primary care doctor.

## 2017-09-07 NOTE — ED Provider Notes (Signed)
[facility]-EMERGENCY DEPT Provider Note   CSN: [id number] Arrival date & time: 09/07/17  0444     History   Chief Complaint Chief Complaint  Patient presents with  . Leg Swelling  . Leg Pain    HPI Yancy Knoble is a 57 y.o. male.  Patient is a 57 year old male with a history of hypertension, alcohol abuse, polysubstance abuse and prior GI bleeding who presents with leg swelling.  He states that he has had swelling in his lower extremities bilaterally for months.  He states it is getting worse and now he has some shortness of breath as well.  He denies any chest pain.  No coughing or fevers.  He denies any medications for the swelling.  He states he has not been up on them more often than he normally is.  He currently does not have a primary care physician.      Past Medical History:  Diagnosis Date  . Abdominal pain, other specified site   . Abnormal CT scan, esophagus 03/2012  . GERD (gastroesophageal reflux disease)   . Substance abuse (HCC) 03/2012   tox screen positive for cocaine.     Patient Active Problem List   Diagnosis Date Noted  . Acute esophagitis   . Multiple gastric ulcers   . Coffee ground emesis 08/09/2016  . Upper GI bleed 08/09/2016  . Alcohol abuse 08/09/2016  . Gastritis without bleeding   . Hematemesis without nausea   . History of alcohol abuse   . 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 Duodenitis 04/02/2016  . GI bleed 04/02/2016  . Personality disorder (HCC) 10/20/2014  . Acute upper GI bleed   . Ileus, postoperative (HCC) 10/15/2014  . Aspiration pneumonia (HCC) 10/14/2014  . Polysubstance abuse (HCC) 10/09/2014  . UGIB (upper gastrointestinal bleed) 10/08/2014  . Leukocytosis 04/30/2013  . Microcytic anemia 04/30/2013  . Hematemesis 08/10/2012  . Anemia due to GI blood loss 08/10/2012  . Acute respiratory failure (HCC) 08/10/2012  . Hypertension 08/10/2012  . Duodenal ulcer with hemorrhage s/p oversew 10/11/2014  08/10/2012  . Encephalopathy acute 08/10/2012    Past Surgical History:  Procedure Laterality Date  . ESOPHAGOGASTRODUODENOSCOPY  08/10/2012   Procedure: ESOPHAGOGASTRODUODENOSCOPY (EGD);  Surgeon: [name], MD;  Location: [facility] ENDOSCOPY;  Service: Endoscopy;  Laterality: N/A;  will be done in room 1   . ESOPHAGOGASTRODUODENOSCOPY Left 05/01/2013   Procedure: ESOPHAGOGASTRODUODENOSCOPY (EGD);  Surgeon: [name], MD;  Location: [facility] ENDOSCOPY;  Service: Endoscopy;  Laterality: Left;  . ESOPHAGOGASTRODUODENOSCOPY N/A 05/11/2013   Procedure: ESOPHAGOGASTRODUODENOSCOPY (EGD);  Surgeon: [name], MD;  Location[facility] ENDOSCOPY;  Service: Endoscopy;  Laterality: N/A;  . ESOPHAGOGASTRODUODENOSCOPY N/A 10/06/2014   Procedure: ESOPHAGOGASTRODUODENOSCOPY (EGD);  Surgeon: [name], MD;  Location: [facility] ENDOSCOPY;  Service: Endoscopy;  Laterality: N/A;  . ESOPHAGOGASTRODUODENOSCOPY N/A 10/09/2014   Procedure: ESOPHAGOGASTRODUODENOSCOPY (EGD);  Surgeon: [name], MD;  [facility];  Service: Gastroenterology;  Laterality: N/A;  Bedside  . ESOPHAGOGASTRODUODENOSCOPY N/A 04/03/2016   Procedure: ESOPHAGOGASTRODUODENOSCOPY (EGD);  Surgeon: [name], MD;  Location: [facility] ORS;  Service: Gastroenterology;  Laterality: N/A;  . ESOPHAGOGASTRODUODENOSCOPY (EGD) WITH PROPOFOL N/A 08/10/2016   Procedure: ESOPHAGOGASTRODUODENOSCOPY (EGD) WITH PROPOFOL;  Surgeon: [name], MD;  Location: [facility] ENDOSCOPY;  Service: Gastroenterology;  Laterality: N/A;  . LAPAROTOMY  08/10/2012   Procedure: EXPLORATORY LAPAROTOMY;  Surgeon: [name], MD;  [facility] OR;  Service: General;  Laterality: N/A;  . LAPAROTOMY N/A 10/10/2014  Procedure: EXPLORATORY LAPAROTOMY WITH GASTROPATHY AND OVER SEWEN DUODENAL ULCER REGION ;  Surgeon: [name], MD;  Location: [facility] ORS;  Service: General;  Laterality: N/A;  . undescended testicle     on right. noted on 03/2012 CT scan.  no surgery referral.        Home  Medications    Prior to Admission medications   Medication Sig Start Date End Date Taking? Authorizing Provider  bismuth-metronidazole-tetracycline [facility]) [phone] MG capsule Take 3 capsules by mouth 4 (four) times daily -  before meals and at bedtime. Patient not taking: Reported on 06/26/2017 08/11/16   Joseph Art, DO  folic acid (FOLVITE) 1 MG tablet Take 1 tablet (1 mg total) by mouth daily. Patient not taking: Reported on 07/30/2016 04/06/16   [name], DO  hydrochlorothiazide (HYDRODIURIL) 25 MG tablet Take 1 tablet (25 mg total) by mouth daily. 09/07/17   Rolan Bucco, MD  Multiple Vitamins-Minerals (MULTIVITAMIN) tablet Take 1 tablet by mouth daily. Patient not taking: Reported on 08/08/2016 04/06/16   [name], DO  ondansetron (ZOFRAN ODT) 4 MG disintegrating tablet Take 1 tablet (4 mg total) by mouth every 8 (eight) hours as needed for nausea or vomiting. Patient not taking: Reported on 06/26/2017 08/09/16   Horton, Mayer Masker, MD  pantoprazole (PROTONIX) 40 MG tablet Take 1 tablet (40 mg total) by mouth 2 (two) times daily. Patient not taking: Reported on 06/26/2017 08/11/16   Joseph Art, DO  thiamine (VITAMIN B-1) 100 MG tablet Take 1 tablet (100 mg total) by mouth daily. Patient not taking: Reported on 07/30/2016 04/06/16   [name], DO    Family History History reviewed. No pertinent family history.  Social History Social History   Tobacco Use  . Smoking status: Light Tobacco Smoker    Packs/day: 1.00    Years: 34.00    Pack years: 34.00  . Smokeless tobacco: Never Used  Substance Use Topics  . Alcohol use: Yes    Comment: 40oz beer/day  . Drug use: Yes    Types: Marijuana    Comment: occ - "$5 bag" "only when I got money"     Allergies   Nsaids; Chocolate; Peanut-containing drug products; and Spinach   Review of Systems Review of Systems  Constitutional: Negative for chills, diaphoresis, fatigue and fever.  HENT: Negative for  congestion, rhinorrhea and sneezing.   Eyes: Negative.   Respiratory: Positive for shortness of breath. Negative for cough and chest tightness.   Cardiovascular: Positive for leg swelling. Negative for chest pain.  Gastrointestinal: Negative for abdominal pain, blood in stool, diarrhea, nausea and vomiting.  Genitourinary: Negative for difficulty urinating, flank pain, frequency and hematuria.  Musculoskeletal: Negative for arthralgias and back pain.  Skin: Negative for rash.  Neurological: Negative for dizziness, speech difficulty, weakness, numbness and headaches.     Physical Exam Updated Vital Signs BP (!) 165/127 (BP Location: Right Arm)   Pulse (!) 59   Temp 98.4 F (36.9 C) (Oral)   Resp 19   Ht 5\' 9"  (1.753 m)   Wt 74.8 kg (165 lb)   SpO2 95%   BMI 24.37 kg/m   Physical Exam  Constitutional: He is oriented to person, place, and time. He appears well-developed and well-nourished.  HENT:  Head: Normocephalic and atraumatic.  Eyes: Pupils are equal, round, and reactive to light.  Neck: Normal range of motion. Neck supple.  Cardiovascular: Normal rate, regular rhythm and normal heart sounds.  Pulmonary/Chest: Effort normal. No respiratory distress.  He has no wheezes. He has no rales (Few crackles in the bases bilaterally). He exhibits no tenderness.  Abdominal: Soft. Bowel sounds are normal. There is no tenderness. There is no rebound and no guarding.  Musculoskeletal: Normal range of motion. He exhibits edema (3+ pitting edema bilaterally with leg changes consistent with chronic venous stasis, pedal pulses are intact there is some mild erythema bilaterally but no other signs of infection).  Lymphadenopathy:    He has no cervical adenopathy.  Neurological: He is alert and oriented to person, place, and time.  Skin: Skin is warm and dry. No rash noted.  Psychiatric: He has a normal mood and affect.     ED Treatments / Results  Labs (all labs ordered are listed, but  only abnormal results are displayed) Labs Reviewed  CBC WITH DIFFERENTIAL/PLATELET - Abnormal; Notable for the following components:      Result Value   Platelets 133 (*)    All other components within normal limits  COMPREHENSIVE METABOLIC PANEL - Abnormal; Notable for the following components:   Glucose, Bld 119 (*)    BUN 24 (*)    All other components within normal limits  BRAIN NATRIURETIC PEPTIDE  I-STAT TROPONIN, ED    EKG  EKG Interpretation  Date/Time:  Thursday September 07 2017 10:25:22 EST Ventricular Rate:  67 PR Interval:    QRS Duration: 88 QT Interval:  417 QTC Calculation: 441 R Axis:   48 Text Interpretation:  Sinus rhythm Prominent P waves, nondiagnostic Inferior infarct, age indeterminate Anterior infarct, old Lateral leads are also involved new t wave inversion inferior/laterally, otherwise unchanged Confirmed by Rolan Bucco [phone]) on 09/07/2017 10:31:12 AM       Radiology Dg Chest 2 View  Result Date: 09/07/2017 CLINICAL DATA:  Left lower extremity swelling since November, 2018 has worsened over the past 2 weeks. Hypertension today. EXAM: CHEST  2 VIEW COMPARISON:  PA and lateral chest 07/23/2017. FINDINGS: Left upper lobe scar is again seen. The chest is hyperexpanded with attenuation of the pulmonary vasculature. No consolidative process, pneumothorax or effusion. Heart size is normal. No acute bony abnormality. IMPRESSION: No acute disease. No change in left upper lobe scar. Findings suggestive of emphysema. Electronically Signed   By: Drusilla Kanner M.D.   On: 09/07/2017 11:53    Procedures Procedures (including critical care time)  Medications Ordered in ED Medications  furosemide (LASIX) injection 40 mg (40 mg Intravenous Given 09/07/17 1011)     Initial Impression / Assessment and Plan / ED Course  I have reviewed the triage vital signs and the nursing notes.  Pertinent labs & imaging results that were available during my care of the patient  were reviewed by me and considered in my medical decision making (see chart for details).    Patient is a 57 year old male who presents with lower leg edema.  He also reports some shortness of breath he has no evidence of pulmonary edema.  His BNP is normal.  His labs are non-concerning.  His creatinine is normal.  There is no evidence of DVT on ultrasound study.  Patient was given a dose of Lasix in the ED and has had some diuresis.  He was discharged home in good condition.  He does have Medicaid and I had case management talk to the patient.  He does have an assigned physician and was encouraged to follow-up with his assigned physician.  He has a history of hypertension but is not on any medications for this.  I will start him on hydrochlorothiazide.  I encouraged him to have follow-up with his PCP.  Return precautions were given.  Final Clinical Impressions(s) / ED Diagnoses   Final diagnoses:  Peripheral edema  Essential hypertension    ED Discharge Orders        Ordered    hydrochlorothiazide (HYDRODIURIL) 25 MG tablet  Daily     09/07/17 1248       Rolan BuccoBelfi, Maxie Debose, MD 09/07/17 1251

## 2019-03-15 ENCOUNTER — Emergency Department (HOSPITAL_COMMUNITY)
Admission: EM | Admit: 2019-03-15 | Discharge: 2019-03-15 | Disposition: A | Discharge status: Home / Self Care | Payer: Medicaid Other | Attending: Emergency Medicine | Admitting: Emergency Medicine

## 2019-03-15 ENCOUNTER — Encounter (HOSPITAL_COMMUNITY): Payer: Self-pay | Admitting: Emergency Medicine

## 2019-03-15 DIAGNOSIS — M7989 Other specified soft tissue disorders: Secondary | ICD-10-CM | POA: Diagnosis present

## 2019-03-15 DIAGNOSIS — F17210 Nicotine dependence, cigarettes, uncomplicated: Secondary | ICD-10-CM | POA: Diagnosis not present

## 2019-03-15 DIAGNOSIS — I872 Venous insufficiency (chronic) (peripheral): Secondary | ICD-10-CM | POA: Insufficient documentation

## 2019-03-15 LAB — CBC
HCT: 45 % (ref 39.0–52.0)
Hemoglobin: 14.5 g/dL (ref 13.0–17.0)
MCH: 27.4 pg (ref 26.0–34.0)
MCHC: 32.2 g/dL (ref 30.0–36.0)
MCV: 84.9 fL (ref 80.0–100.0)
Platelets: 142 10*3/uL — ABNORMAL LOW (ref 150–400)
RBC: 5.3 MIL/uL (ref 4.22–5.81)
RDW: 13.9 % (ref 11.5–15.5)
WBC: 5.9 10*3/uL (ref 4.0–10.5)
nRBC: 0 % (ref 0.0–0.2)

## 2019-03-15 LAB — BASIC METABOLIC PANEL
Anion gap: 10 (ref 5–15)
BUN: 13 mg/dL (ref 6–20)
CO2: 24 mmol/L (ref 22–32)
Calcium: 9.1 mg/dL (ref 8.9–10.3)
Chloride: 104 mmol/L (ref 98–111)
Creatinine, Ser: 1.08 mg/dL (ref 0.61–1.24)
GFR calc Af Amer: >60 mL/min (ref >60)
GFR calc non Af Amer: >60 mL/min (ref >60)
Glucose, Bld: 115 mg/dL — ABNORMAL HIGH (ref 70–99)
Potassium: 3.8 mmol/L (ref 3.5–5.1)
Sodium: 138 mmol/L (ref 135–145)

## 2019-03-15 LAB — HEPATIC FUNCTION PANEL
ALT: 24 U/L (ref 0–44)
AST: 34 U/L (ref 15–41)
Albumin: 3.8 g/dL (ref 3.5–5.0)
Alkaline Phosphatase: 66 U/L (ref 38–126)
Bilirubin, Direct: 0.1 mg/dL (ref 0.0–0.2)
Indirect Bilirubin: 0.6 mg/dL (ref 0.3–0.9)
Total Bilirubin: 0.7 mg/dL (ref 0.3–1.2)
Total Protein: 7.1 g/dL (ref 6.5–8.1)

## 2019-03-15 LAB — BRAIN NATRIURETIC PEPTIDE: B Natriuretic Peptide: 18.4 pg/mL (ref 0.0–100.0)

## 2019-03-15 MED ORDER — SODIUM CHLORIDE 0.9% FLUSH: 3.0000 mL | Freq: Once | INTRAVENOUS | Status: DC

## 2019-03-15 MED ORDER — HYDROCHLOROTHIAZIDE 25 MG PO TABS: 25.0000 mg | ORAL_TABLET | Freq: Every day | ORAL | 2 refills | Status: AC

## 2019-03-15 NOTE — ED Notes (Signed)
Lab to add-on HFP and BNP

## 2019-03-15 NOTE — ED Provider Notes (Signed)
[facility] Emergency Department Provider Note MRN:  [id number]  Arrival date & time: 03/15/19     Chief Complaint   Leg Swelling   History of Present Illness   [name] is a 58 y.o. year-old male with a history of GERD presenting to the ED with chief complaint of leg swelling.  2 months of progressively worsening swelling to the bilateral legs.  Happened once before a few years ago.  Denies chest pain or shortness of breath, no fever, no abdominal pain.  Feels a general heaviness to the legs that is uncomfortable, mild in severity, no exacerbating or relieving factors.  Review of Systems  A complete 10 system review of systems was obtained and all systems are negative except as noted in the HPI and PMH.   Patient's Health History    Past Medical History:  Diagnosis Date  . Abdominal pain, other specified site   . Abnormal CT scan, esophagus 03/2012  . GERD (gastroesophageal reflux disease)   . Substance abuse (HCC) 03/2012   tox screen positive for cocaine.     Past Surgical History:  Procedure Laterality Date  . ESOPHAGOGASTRODUODENOSCOPY  08/10/2012   Procedure: ESOPHAGOGASTRODUODENOSCOPY (EGD);  Surgeon: Rachael Feeaniel P Jacobs, MD;  [facility];  Service: Endoscopy;  Laterality: N/A;  will be done in room 1   . ESOPHAGOGASTRODUODENOSCOPY Left 05/01/2013   Procedure: ESOPHAGOGASTRODUODENOSCOPY (EGD);  Surgeon: Charna ElizabethJyothi Mann, MD;  Location: [facility] ENDOSCOPY;  Service: Endoscopy;  Laterality: Left;  . ESOPHAGOGASTRODUODENOSCOPY N/A 05/11/2013   Procedure: ESOPHAGOGASTRODUODENOSCOPY (EGD);  Surgeon: Graylin ShiverSalem F Ganem, MD;  [facility];  Service: Endoscopy;  Laterality: N/A;  . ESOPHAGOGASTRODUODENOSCOPY N/A 10/06/2014   Procedure: ESOPHAGOGASTRODUODENOSCOPY (EGD);  Surgeon: Beverley FiedlerJay M Pyrtle, MD;  [facility];  Service: Endoscopy;  Laterality: N/A;  . ESOPHAGOGASTRODUODENOSCOPY N/A 10/09/2014   Procedure: ESOPHAGOGASTRODUODENOSCOPY (EGD);  Surgeon: Beverley FiedlerJay  M Pyrtle, MD;  [facility];  Service: Gastroenterology;  Laterality: N/A;  Bedside  . ESOPHAGOGASTRODUODENOSCOPY N/A 04/03/2016   Procedure: ESOPHAGOGASTRODUODENOSCOPY (EGD);  Surgeon: Napoleon FormKavitha V Nandigam, MD;  Location: [facility] ORS;  Service: Gastroenterology;  Laterality: N/A;  . ESOPHAGOGASTRODUODENOSCOPY (EGD) WITH PROPOFOL N/A 08/10/2016   Procedure: ESOPHAGOGASTRODUODENOSCOPY (EGD) WITH PROPOFOL;  Surgeon: Beverley FiedlerJay M Pyrtle, MD;  Location[facility]OS[facility];  Service: Gastroenterology;  Laterality: N/A;  . LAPAROTOMY  08/10/2012   Procedure: EXPLORATORY LAPAROTOMY;  Surgeon: Cherylynn RidgesJames O Wyatt, MD;  [facility];  Service: General;  Laterality: N/A;  . LAPAROTOMY N/A 10/10/2014   Procedure: EXPLORATORY LAPAROTOMY WITH GASTROPATHY AND OVER SEWEN DUODENAL ULCER REGION ;  Surgeon: Luretha MurphyMatthew Martin, MD;  Location: [facility] ORS;  Service: General;  Laterality: N/A;  . undescended testicle     on right. noted on 03/2012 CT scan.  no surgery referral.     No family history on file.  Social History   Socioeconomic History  . Marital status: Single    Spouse name: Not on file  . Number of children: Not on file  . Years of education: Not on file  . Highest education level: Not on file  Occupational History  . Not on file  Social Needs  . Financial resource strain: Not on file  . Food insecurity    Worry: Not on file    Inability: Not on file  . Transportation needs    Medical: Not on file    Non-medical: Not on file  Tobacco Use  . Smoking status: Light Tobacco Smoker    Packs/day: 1.00    Years: 34.00  Pack years: 34.00  . Smokeless tobacco: Never Used  Substance and Sexual Activity  . Alcohol use: Yes    Comment: 40oz beer/day  . Drug use: Yes    Types: Marijuana    Comment: occ - "$5 bag" "only when I got money"  . Sexual activity: Never  Lifestyle  . Physical activity    Days per week: Not on file    Minutes per session: Not on file  . Stress: Not on file  Relationships  . Social  coMusiciannnections    Talks on phone: Not on file    Gets together: Not on file    Attends religious service: Not on file    Active member of club or organization: Not on file    Attends meetings of clubs or organizations: Not on file    Relationship status: Not on file  . Intimate partner violence    Fear of current or ex partner: Not on file    Emotionally abused: Not on file    Physically abused: Not on file    Forced sexual activity: Not on file  Other Topics Concern  . Not on file  Social History Narrative  . Not on file     Physical Exam  Vital Signs and Nursing Notes reviewed Vitals:   03/15/19 0858 03/15/19 1200  BP: (!) 175/102 (!) 170/92  Pulse: 96 74  Resp: (!) 22 18  Temp: 98.2 F (36.8 C)   SpO2: 96% 97%    CONSTITUTIONAL: Well-appearing, NAD NEURO:  Alert and oriented x 3, no focal deficits EYES:  eyes equal and reactive ENT/NECK:  no LAD, no JVD CARDIO: Regular rate, well-perfused, normal S1 and S2 PULM:  CTAB no wheezing or rhonchi GI/GU:  normal bowel sounds, non-distended, non-tender MSK/SPINE:  No gross deformities, 2+ pitting edema to bilateral lower extremities, chronic venous stasis skin changes SKIN:  no rash, atraumatic PSYCH:  Appropriate speech and behavior  Diagnostic and Interventional Summary    Labs Reviewed  BASIC METABOLIC PANEL - Abnormal; Notable for the following components:      Result Value   Glucose, Bld 115 (*)    All other components within normal limits  CBC - Abnormal; Notable for the following components:   Platelets 142 (*)    All other components within normal limits  HEPATIC FUNCTION PANEL  BRAIN NATRIURETIC PEPTIDE    No orders to display    Medications  sodium chloride flush (NS) 0.9 % injection 3 mL (has no administration in time range)     Procedures Critical Care  ED Course and Medical Decision Making  I have reviewed the triage vital signs and the nursing notes.  Pertinent labs & imaging results that were  available during my care of the patient were reviewed by me and considered in my medical decision making (see below for details).  Suspect venous insufficiency in this 58 year old male with bilateral symmetric leg swelling, no chest pain or shortness of breath.  Will evaluate with labs to exclude hepatic, renal, cardiac etiology.  Patient explains he is unable to afford stockings at a pharmacy, will wrap his legs with Ace wraps, advised elevating the legs as much as possible, prescribe diuretic.  Labs are reassuring, will refill patient's HCTZ to aid with his high blood pressure and use it as a diuretic to help with the leg swelling.  Provided with Ace wraps and legs were wrapped here in the emergency department for educational purposes.  Patient will purchase compression stockings  and fill his HCTZ when he has the funds to do so.  Strict return precautions for worsening pain or redness or swelling, chest pain or shortness of breath.  After the discussed management above, the patient was determined to be safe for discharge.  The patient was in agreement with this plan and all questions regarding their care were answered.  ED return precautions were discussed and the patient will return to the ED with any significant worsening of condition.  Barth Kirks. [place] Small, [place] [email or web address]  Final Clinical Impressions(s) / ED Diagnoses     ICD-10-CM   1. Venous insufficiency (chronic) (peripheral)  I87.2     ED Discharge Orders         Ordered    hydrochlorothiazide (HYDRODIURIL) 25 MG tablet  Daily     03/15/19 1313             Maudie Flakes, MD 03/15/19 1315

## 2019-03-15 NOTE — Discharge Instructions (Addendum)
You were evaluated in the Emergency Department and after careful evaluation, we did not find any emergent condition requiring admission or further testing in the hospital.  Your symptoms today seem to be due to leg swelling related to a problem with the veins of your legs.  You will need to elevate your legs as often as you can during the day and use compression stockings or the Ace wraps provided.  It is also important you restart your hydrochlorothiazide medication.  Please return to the Emergency Department if you experience any worsening of your condition.  We encourage you to follow up with a primary care provider.  Thank you for allowing [place] to be a part of your care.

## 2019-03-15 NOTE — ED Triage Notes (Signed)
Pt arrives with c/o of large amount of bilateral leg swelling that he states began on Sunday. Pt is having a hard try ambulating due to excessive swelling in both legs. Pt denies any cp or sob.

## 2019-07-20 ENCOUNTER — Other Ambulatory Visit: Payer: Self-pay

## 2019-07-20 ENCOUNTER — Encounter (HOSPITAL_COMMUNITY): Payer: Self-pay | Admitting: *Deleted

## 2019-07-20 ENCOUNTER — Emergency Department (HOSPITAL_COMMUNITY)
Admission: EM | Admit: 2019-07-20 | Discharge: 2019-07-21 | Disposition: A | Discharge status: Home / Self Care | Payer: Medicaid Other | Attending: Emergency Medicine | Admitting: Emergency Medicine

## 2019-07-20 DIAGNOSIS — Z91010 Allergy to peanuts: Secondary | ICD-10-CM | POA: Insufficient documentation

## 2019-07-20 DIAGNOSIS — Z5900 Homelessness unspecified: Secondary | ICD-10-CM

## 2019-07-20 DIAGNOSIS — F17200 Nicotine dependence, unspecified, uncomplicated: Secondary | ICD-10-CM | POA: Diagnosis not present

## 2019-07-20 DIAGNOSIS — Z79899 Other long term (current) drug therapy: Secondary | ICD-10-CM | POA: Diagnosis not present

## 2019-07-20 DIAGNOSIS — I10 Essential (primary) hypertension: Secondary | ICD-10-CM | POA: Diagnosis not present
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
## 2019-07-21 NOTE — ED Notes (Signed)
Pt refused signing esignature
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
## 2019-07-28 ENCOUNTER — Other Ambulatory Visit: Payer: Self-pay

## 2019-07-28 ENCOUNTER — Encounter (HOSPITAL_COMMUNITY): Payer: Self-pay | Admitting: Emergency Medicine

## 2019-07-28 ENCOUNTER — Emergency Department (HOSPITAL_COMMUNITY)
Admission: EM | Admit: 2019-07-28 | Discharge: 2019-07-29 | Disposition: A | Discharge status: Home / Self Care | Payer: Medicaid Other | Attending: Emergency Medicine | Admitting: Emergency Medicine

## 2019-07-28 DIAGNOSIS — G8929 Other chronic pain: Secondary | ICD-10-CM | POA: Diagnosis not present

## 2019-07-28 DIAGNOSIS — I10 Essential (primary) hypertension: Secondary | ICD-10-CM | POA: Insufficient documentation

## 2019-07-28 DIAGNOSIS — F17200 Nicotine dependence, unspecified, uncomplicated: Secondary | ICD-10-CM | POA: Insufficient documentation

## 2019-07-28 DIAGNOSIS — M79606 Pain in leg, unspecified: Secondary | ICD-10-CM | POA: Diagnosis present

## 2019-07-28 DIAGNOSIS — Z79899 Other long term (current) drug therapy: Secondary | ICD-10-CM | POA: Diagnosis not present

## 2019-07-28 NOTE — ED Triage Notes (Signed)
Pt c/o "pain all over". When asked how long he has been in pain, pt states "all year".

## 2019-07-29 NOTE — ED Provider Notes (Signed)
[facility] EMERGENCY DEPARTMENT Provider Note   CSN: [id number] Arrival date & time: 07/28/19  2254     History Chief Complaint  Patient presents with  . Leg Pain    [name] is a 58 y.o. male.  Patient presents to the emergency department with a chief complaint of chronic leg pain.  He states that he has had the pain for the past 1 year.  He states that he needs something to take the pain.  No successful treatments prior to arrival.  He states that the pain causes him to fall.  The history is provided by the patient. No language interpreter was used.       Past Medical History:  Diagnosis Date  . Abdominal pain, other specified site   . Abnormal CT scan, esophagus 03/2012  . GERD (gastroesophageal reflux disease)   . Substance abuse (HCC) 03/2012   tox screen positive for cocaine.     Patient Active Problem List   Diagnosis Date Noted  . Acute esophagitis   . Multiple gastric ulcers   . Coffee ground emesis 08/09/2016  . Upper GI bleed 08/09/2016  . Alcohol abuse 08/09/2016  . Gastritis without bleeding   . Hematemesis without nausea   . History of alcohol abuse   . 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 Duodenitis 04/02/2016  . GI bleed 04/02/2016  . Personality disorder (HCC) 10/20/2014  . Acute upper GI bleed   . Ileus, postoperative (HCC) 10/15/2014  . Aspiration pneumonia (HCC) 10/14/2014  . Polysubstance abuse (HCC) 10/09/2014  . UGIB (upper gastrointestinal bleed) 10/08/2014  . Leukocytosis 04/30/2013  . Microcytic anemia 04/30/2013  . Hematemesis 08/10/2012  . Anemia due to GI blood loss 08/10/2012  . Acute respiratory failure (HCC) 08/10/2012  . Hypertension 08/10/2012  . Duodenal ulcer with hemorrhage s/p oversew 10/11/2014 08/10/2012  . Encephalopathy acute 08/10/2012    Past Surgical History:  Procedure Laterality Date  . ESOPHAGOGASTRODUODENOSCOPY  08/10/2012   Procedure: ESOPHAGOGASTRODUODENOSCOPY (EGD);  Surgeon: [name], MD;  Location: [facility] ENDOSCOPY;  Service: Endoscopy;  Laterality: N/A;  will be done in room 1   . ESOPHAGOGASTRODUODENOSCOPY Left 05/01/2013   Procedure: ESOPHAGOGASTRODUODENOSCOPY (EGD);  Surgeon: [name], MD;  Location: [facility] ENDOSCOPY;  Service: Endoscopy;  Laterality: Left;  . ESOPHAGOGASTRODUODENOSCOPY N/A 05/11/2013   Procedure: ESOPHAGOGASTRODUODENOSCOPY (EGD);  Surgeon: [name], MD;  Location: [facility] ENDOSCOPY;  Service: Endoscopy;  Laterality: N/A;  . ESOPHAGOGASTRODUODENOSCOPY N/A 10/06/2014   Procedure: ESOPHAGOGASTRODUODENOSCOPY (EGD);  Surgeon: [name], MD;  Location: [facility] ENDOSCOPY;  Service: Endoscopy;  Laterality: N/A;  . ESOPHAGOGASTRODUODENOSCOPY N/A 10/09/2014   Procedure: ESOPHAGOGASTRODUODENOSCOPY (EGD);  Surgeon: [name], MD;  Location: [facility] ENDOSCOPY;  Service: Gastroenterology;  Laterality: N/A;  Bedside  . ESOPHAGOGASTRODUODENOSCOPY N/A 04/03/2016   Procedure: ESOPHAGOGASTRODUODENOSCOPY (EGD);  Surgeon: [name], MD;  Location: [facility] ORS;  Service: Gastroenterology;  Laterality: N/A;  . ESOPHAGOGASTRODUODENOSCOPY (EGD) WITH PROPOFOL N/A 08/10/2016   Procedure: ESOPHAGOGASTRODUODENOSCOPY (EGD) WITH PROPOFOL;  Surgeon: [name], MD;  Location: [facility] ENDOSCOPY;  Service: Gastroenterology;  Laterality: N/A;  . LAPAROTOMY  08/10/2012   Procedure: EXPLORATORY LAPAROTOMY;  Surgeon: [name], MD;  Location: [facility] OR;  Service: General;  Laterality: N/A;  . LAPAROTOMY N/A 10/10/2014   Procedure: EXPLORATORY LAPAROTOMY WITH GASTROPATHY AND OVER SEWEN DUODENAL ULCER REGION ;  Surgeon: [name], MD;  Location: [facility] ORS;  Service: General;  Laterality: N/A;  . undescended testicle     on right. noted  on 03/2012 CT scan.  no surgery referral.        No family history on file.  Social History   Tobacco Use  . Smoking status: Current Every Day Smoker    Packs/day: 1.00    Years: 34.00    Pack years: 34.00  . Smokeless tobacco: Never Used  Substance Use  Topics  . Alcohol use: Yes    Comment: 40oz beer/day  . Drug use: Yes    Types: Marijuana    Comment: occ - "$5 bag" "only when I got money"    Home Medications Prior to Admission medications   Medication Sig Start Date End Date Taking? Authorizing Provider  bismuth-metronidazole-tetracycline [facility](PYLERA) [phone] MG capsule Take 3 capsules by mouth 4 (four) times daily -  before meals and at bedtime. Patient not taking: Reported on 06/26/2017 08/11/16   Joseph ArtVann, Jessica U, DO  folic acid (FOLVITE) 1 MG tablet Take 1 tablet (1 mg total) by mouth daily. Patient not taking: Reported on 07/30/2016 04/06/16   Edsel PetrinMikhail, Maryann, DO  hydrochlorothiazide (HYDRODIURIL) 25 MG tablet Take 1 tablet (25 mg total) by mouth daily. 03/15/19   Sabas SousBero, Michael M, MD  Multiple Vitamins-Minerals (MULTIVITAMIN) tablet Take 1 tablet by mouth daily. Patient not taking: Reported on 08/08/2016 04/06/16   Edsel PetrinMikhail, Maryann, DO  ondansetron (ZOFRAN ODT) 4 MG disintegrating tablet Take 1 tablet (4 mg total) by mouth every 8 (eight) hours as needed for nausea or vomiting. Patient not taking: Reported on 06/26/2017 08/09/16   Horton, Mayer Maskerourtney F, MD  pantoprazole (PROTONIX) 40 MG tablet Take 1 tablet (40 mg total) by mouth 2 (two) times daily. Patient not taking: Reported on 06/26/2017 08/11/16   Joseph ArtVann, Jessica U, DO  thiamine (VITAMIN B-1) 100 MG tablet Take 1 tablet (100 mg total) by mouth daily. Patient not taking: Reported on 07/30/2016 04/06/16   Edsel PetrinMikhail, Maryann, DO    Allergies    Nsaids, Chocolate, Peanut-containing drug products, and Spinach  Review of Systems   Review of Systems  All other systems reviewed and are negative.   Physical Exam Updated Vital Signs BP (!) 159/110 (BP Location: Right Arm)   Pulse 78   Temp 97.8 F (36.6 C) (Oral)   Resp 18   SpO2 95%   Physical Exam Vitals and nursing note reviewed.  Constitutional:      General: He is not in acute distress.    Appearance: He is well-developed. He is  not ill-appearing.  HENT:     Head: Normocephalic and atraumatic.  Eyes:     Conjunctiva/sclera: Conjunctivae normal.  Cardiovascular:     Rate and Rhythm: Normal rate.  Pulmonary:     Effort: Pulmonary effort is normal. No respiratory distress.  Abdominal:     General: There is no distension.  Musculoskeletal:     Cervical back: Neck supple.     Comments: Moves all extremities Patient ambulates without difficulty  Skin:    General: Skin is warm and dry.  Neurological:     Mental Status: He is alert and oriented to person, place, and time.  Psychiatric:        Mood and Affect: Mood normal.        Behavior: Behavior normal.     ED Results / Procedures / Treatments   Labs (all labs ordered are listed, but only abnormal results are displayed) Labs Reviewed - No data to display  EKG None  Radiology No results found.  Procedures Procedures (including critical care time)  Medications  Ordered in ED Medications - No data to display  ED Course  I have reviewed the triage vital signs and the nursing notes.  Pertinent labs & imaging results that were available during my care of the patient were reviewed by me and considered in my medical decision making (see chart for details).    MDM Rules/Calculators/A&P                      Patient here requesting treatment for chronic pain.  He avoids my questions, and repeatedly asks for something for his pain.  I encouraged patient to follow-up in outpatient clinic, his chronic pain is not best managed in the emergency department.  He is frustrated with this response. The patient ambulates normally, and is in no acute distress with normal vitals, no further emergent work-up indicated.   Final Clinical Impression(s) / ED Diagnoses Final diagnoses:  Other chronic pain    Rx / DC Orders ED Discharge Orders    None       Montine Circle, PA-C 07/29/19 0531    Veryl Speak, MD 07/29/19 [phone]

## 2019-07-29 NOTE — ED Notes (Signed)
Called for vitals with no response 

## 2019-07-29 NOTE — ED Notes (Signed)
Patient verbalizes understanding of discharge instructions. Opportunity for questioning and answers were provided. Armband removed by staff, pt discharged from ED. Ambulated out to lobby  

## 2019-07-29 NOTE — Discharge Instructions (Addendum)
We are sorry that you are in pain.  Chronic pain is best treated by a pain specialist and not in the emergency department.  Please see the resources listed.

## 2019-09-05 ENCOUNTER — Other Ambulatory Visit: Payer: Self-pay

## 2019-09-05 ENCOUNTER — Emergency Department (HOSPITAL_COMMUNITY)
Admission: EM | Admit: 2019-09-05 | Discharge: 2019-09-05 | Disposition: A | Discharge status: Home / Self Care | Payer: Medicaid Other | Attending: Emergency Medicine | Admitting: Emergency Medicine

## 2019-09-05 ENCOUNTER — Encounter (HOSPITAL_COMMUNITY): Payer: Self-pay

## 2019-09-05 DIAGNOSIS — I10 Essential (primary) hypertension: Secondary | ICD-10-CM | POA: Diagnosis not present

## 2019-09-05 DIAGNOSIS — T593X3A Toxic effect of lacrimogenic gas, assault, initial encounter: Secondary | ICD-10-CM | POA: Insufficient documentation

## 2019-09-05 DIAGNOSIS — H5789 Other specified disorders of eye and adnexa: Secondary | ICD-10-CM

## 2019-09-05 DIAGNOSIS — Z590 Homelessness: Secondary | ICD-10-CM | POA: Insufficient documentation

## 2019-09-05 DIAGNOSIS — H5713 Ocular pain, bilateral: Secondary | ICD-10-CM | POA: Diagnosis present

## 2019-09-05 DIAGNOSIS — Z79899 Other long term (current) drug therapy: Secondary | ICD-10-CM | POA: Diagnosis not present

## 2019-09-05 DIAGNOSIS — F17210 Nicotine dependence, cigarettes, uncomplicated: Secondary | ICD-10-CM | POA: Insufficient documentation

## 2019-09-05 MED ORDER — POLYMYXIN B-TRIMETHOPRIM 10000-0.1 UNIT/ML-% OP SOLN
2.0000 [drp] | Freq: Two times a day (BID) | OPHTHALMIC | Status: DC
  Administered 2019-09-05: 2 [drp] via OPHTHALMIC
  Filled 2019-09-05: qty 10
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
## 2020-04-26 ENCOUNTER — Other Ambulatory Visit: Payer: Self-pay

## 2020-04-26 ENCOUNTER — Emergency Department (HOSPITAL_COMMUNITY)
Admission: EM | Admit: 2020-04-26 | Discharge: 2020-04-26 | Discharge status: Against Medical Advice | Payer: Medicaid Other | Attending: Emergency Medicine | Admitting: Emergency Medicine

## 2020-04-26 ENCOUNTER — Encounter (HOSPITAL_COMMUNITY): Payer: Self-pay | Admitting: Emergency Medicine

## 2020-04-26 DIAGNOSIS — F17200 Nicotine dependence, unspecified, uncomplicated: Secondary | ICD-10-CM | POA: Diagnosis not present

## 2020-04-26 DIAGNOSIS — M7989 Other specified soft tissue disorders: Secondary | ICD-10-CM | POA: Diagnosis not present

## 2020-04-26 NOTE — ED Triage Notes (Signed)
Jail nurse said that patient's chronic feet swelling needed to be assessed by a medical provider before they will accept him. [employer] [facility] bedside to [facility] to [facility].

## 2020-04-26 NOTE — ED Provider Notes (Signed)
[facility] Emergency Department Provider Note MRN:  [id number]  Arrival date & time: 04/26/20     Chief Complaint   Leg swelling History of Present Illness   [name] is a 59 y.o. year-old male with no pertinent past medical history presenting to the ED with chief complaint of leg swelling.  1 year of leg swelling.  Sent here with police custody to be evaluated prior to being brought to jail.  Patient denies any changes to the legs recently, has struggled with leg swelling for a long time.  Has not seen a primary care doctor in a year or 2.  Patient denies any shoulder pain, no trauma, no chest pain or shortness of breath, no abdominal pain, no fever, no other complaints.  Swelling improves when he does not walk so much.  Review of Systems  A complete 10 system review of systems was obtained and all systems are negative except as noted in the HPI and PMH.   Patient's Health History    Past Medical History:  Diagnosis Date  . Abdominal pain, other specified site   . Abnormal CT scan, esophagus 03/2012  . GERD (gastroesophageal reflux disease)   . Substance abuse (HCC) 03/2012   tox screen positive for cocaine.     Past Surgical History:  Procedure Laterality Date  . ESOPHAGOGASTRODUODENOSCOPY  08/10/2012   Procedure: ESOPHAGOGASTRODUODENOSCOPY (EGD);  Surgeon: [name], MD;  Location: [facility] ENDOSCOPY;  Service: Endoscopy;  Laterality: N/A;  will be done in room 1   . ESOPHAGOGASTRODUODENOSCOPY Left 05/01/2013   Procedure: ESOPHAGOGASTRODUODENOSCOPY (EGD);  Surgeon: [name], MD;  Location[facility];  Service: Endoscopy;  Laterality: Left;  . ESOPHAGOGASTRODUODENOSCOPY N/A 05/11/2013   Procedure: ESOPHAGOGASTRODUODENOSCOPY (EGD);  Surgeon: [name], MD;  Location: [facility] ENDOSCOPY;  Service: Endoscopy;  Laterality: N/A;  . ESOPHAGOGASTRODUODENOSCOPY N/A 10/06/2014   Procedure: ESOPHAGOGASTRODUODENOSCOPY (EGD);  Surgeon: [name], MD;   Location: [facility] ENDOSCOPY;  Service: Endoscopy;  Laterality: N/A;  . ESOPHAGOGASTRODUODENOSCOPY N/A 10/09/2014   Procedure: ESOPHAGOGASTRODUODENOSCOPY (EGD);  Surgeon: [name], MD;  Location: [facility] ENDOSCOPY;  Service: Gastroenterology;  Laterality: N/A;  Bedside  . ESOPHAGOGASTRODUODENOSCOPY N/A 04/03/2016   Procedure: ESOPHAGOGASTRODUODENOSCOPY (EGD);  Surgeon: [name], MD;  [facility] ORS;  Service: Gastroenterology;  Laterality: N/A;  . ESOPHAGOGASTRODUODENOSCOPY (EGD) WITH PROPOFOL N/A 08/10/2016   Procedure: ESOPHAGOGASTRODUODENOSCOPY (EGD) WITH PROPOFOL;  Surgeon: [name], MD;  Location[facility]OS[facility];  Service: Gastroenterology;  Laterality: N/A;  . LAPAROTOMY  08/10/2012   Procedure: EXPLORATORY LAPAROTOMY;  Surgeon: [name], MD;  [facility] OR;  Service: General;  Laterality: N/A;  . LAPAROTOMY N/A 10/10/2014   Procedure: EXPLORATORY LAPAROTOMY WITH GASTROPATHY AND OVER SEWEN DUODENAL ULCER REGION ;  Surgeon: [name], MD;  Location[facility]S;  Service: General;  Laterality: N/A;  . undescended testicle     on right. noted on 03/2012 CT scan.  no surgery referral.     History reviewed. No pertinent family history.  Social History   Socioeconomic History  . Marital status: Single    Spouse name: Not on file  . Number of children: Not on file  . Years of education: Not on file  . Highest education level: Not on file  Occupational History  . Not on file  Tobacco Use  . Smoking status: Current Every Day Smoker    Packs/day: 1.00    Years: 34.00    Pack years: 34.00  . Smokeless tobacco: Never Used  Substance and Sexual Activity  . Alcohol use: Yes    Comment: 40oz beer/day  . Drug use: Yes    Types: Marijuana    Comment: occ - "$5 bag" "only when I got money"  . Sexual activity: Never  Other Topics Concern  . Not on file  Social History Narrative  . Not on file   Social Determinants of Health   Financial Resource Strain:   . Difficulty of  Paying Living Expenses: Not on file  Food Insecurity:   . Worried About Programme researcher, broadcasting/film/video in the Last Year: Not on file  . Ran Out of Food in the Last Year: Not on file  Transportation Needs:   . Lack of Transportation (Medical): Not on file  . Lack of Transportation (Non-Medical): Not on file  Physical Activity:   . Days of Exercise per Week: Not on file  . Minutes of Exercise per Session: Not on file  Stress:   . Feeling of Stress : Not on file  Social Connections:   . Frequency of Communication with Friends and Family: Not on file  . Frequency of Social Gatherings with Friends and Family: Not on file  . Attends Religious Services: Not on file  . Active Member of Clubs or Organizations: Not on file  . Attends Banker Meetings: Not on file  . Marital Status: Not on file  Intimate Partner Violence:   . Fear of Current or Ex-Partner: Not on file  . Emotionally Abused: Not on file  . Physically Abused: Not on file  . Sexually Abused: Not on file     Physical Exam   Vitals:   04/26/20 2317  BP: (!) 165/102  Pulse: 64  Resp: 16  Temp: 98 F (36.7 C)  SpO2: 100%    CONSTITUTIONAL: Well-appearing, NAD NEURO:  Alert and oriented x 3, no focal deficits EYES:  eyes equal and reactive ENT/NECK:  no LAD, no JVD CARDIO: Regular rate, well-perfused, normal S1 and S2 PULM:  CTAB no wheezing or rhonchi GI/GU:  normal bowel sounds, non-distended, non-tender MSK/SPINE: 2+ pitting edema to bilateral lower extremities, no significant tenderness, no increased warmth, chronic skin changes but no erythema SKIN:  no rash, atraumatic PSYCH:  Appropriate speech and behavior  *Additional and/or pertinent findings included in MDM below  Diagnostic and Interventional Summary    EKG Interpretation  Date/Time:    Ventricular Rate:    PR Interval:    QRS Duration:   QT Interval:    QTC Calculation:   R Axis:     Text Interpretation:        Labs Reviewed - No data to  display  No orders to display    Medications - No data to display   Procedures  /  Critical Care Procedures  ED Course and Medical Decision Making  I have reviewed the triage vital signs, the nursing notes, and pertinent available records from the EMR.  Listed above are laboratory and imaging tests that I personally ordered, reviewed, and interpreted and then considered in my medical decision making (see below for details).  1 year of leg swelling, no other symptoms to suggest cardiac, renal, liver etiology.  Much more likely to be venous insufficiency.  Normal vital signs, given the chronicity highly doubt DVT or infection.  Appropriate for conservative management with compression stockings and PCP follow-up.       Elmer Sow. Pilar Plate, MD [facility] Emergency Medicine [facility] [email or web address]  Final  Clinical Impressions(s) / ED Diagnoses     ICD-10-CM   1. Leg swelling  M79.89     ED Discharge Orders    None       Discharge Instructions Discussed with and Provided to Patient:     Discharge Instructions     You were evaluated in the Emergency Department and after careful evaluation, we did not find any emergent condition requiring admission or further testing in the hospital.  Your exam/testing today was overall reassuring.  Your leg swelling is likely due to venous insufficiency of the leg veins.  We recommend compression stockings or wraps to the legs and elevating the legs as much as you can during the day.  We recommend primary care follow-up for further management.  Please return to the Emergency Department if you experience any worsening of your condition.  Thank you for allowing [place] to be a part of your care.       Sabas Sous, MD 04/26/20 [phone]

## 2020-04-26 NOTE — Discharge Instructions (Addendum)
You were evaluated in the Emergency Department and after careful evaluation, we did not find any emergent condition requiring admission or further testing in the hospital.  Your exam/testing today was overall reassuring.  Your leg swelling is likely due to venous insufficiency of the leg veins.  We recommend compression stockings or wraps to the legs and elevating the legs as much as you can during the day.  We recommend primary care follow-up for further management.  Please return to the Emergency Department if you experience any worsening of your condition.  Thank you for allowing [place] to be a part of your care.

## 2023-03-01 LAB — AMB RESULTS CONSOLE CBG: Glucose: 107

## 2023-03-02 NOTE — Progress Notes (Signed)
This nurse urged pt to proceed to hospital due to high blood pressure. Pt given documentation about the risks of high blood pressure in that range. Pt also given information for community care clinics. Pt stated he would not go to hospital but would go to [facility] the following day.

## 2023-07-07 ENCOUNTER — Encounter (HOSPITAL_BASED_OUTPATIENT_CLINIC_OR_DEPARTMENT_OTHER): Payer: Self-pay

## 2023-07-07 ENCOUNTER — Other Ambulatory Visit: Payer: Self-pay

## 2023-07-07 ENCOUNTER — Emergency Department (HOSPITAL_BASED_OUTPATIENT_CLINIC_OR_DEPARTMENT_OTHER)
Admission: EM | Admit: 2023-07-07 | Discharge: 2023-07-07 | Disposition: A | Discharge status: Home / Self Care | Payer: MEDICAID | Attending: Emergency Medicine | Admitting: Emergency Medicine

## 2023-07-07 DIAGNOSIS — Z5900 Homelessness unspecified: Secondary | ICD-10-CM | POA: Insufficient documentation

## 2023-07-07 DIAGNOSIS — Z79899 Other long term (current) drug therapy: Secondary | ICD-10-CM | POA: Insufficient documentation

## 2023-07-07 DIAGNOSIS — M79661 Pain in right lower leg: Secondary | ICD-10-CM | POA: Diagnosis present

## 2023-07-07 DIAGNOSIS — Z91010 Allergy to peanuts: Secondary | ICD-10-CM | POA: Diagnosis not present

## 2023-07-07 DIAGNOSIS — I10 Essential (primary) hypertension: Secondary | ICD-10-CM | POA: Insufficient documentation

## 2023-07-07 DIAGNOSIS — G8929 Other chronic pain: Secondary | ICD-10-CM | POA: Insufficient documentation

## 2023-07-07 DIAGNOSIS — M79662 Pain in left lower leg: Secondary | ICD-10-CM | POA: Insufficient documentation

## 2023-07-07 DIAGNOSIS — F17210 Nicotine dependence, cigarettes, uncomplicated: Secondary | ICD-10-CM | POA: Insufficient documentation

## 2023-07-07 MED ORDER — PANTOPRAZOLE SODIUM 40 MG PO TBEC: 40.0000 mg | DELAYED_RELEASE_TABLET | Freq: Two times a day (BID) | ORAL | 0 refills | Status: DC

## 2023-07-07 MED ORDER — HYDROCHLOROTHIAZIDE 25 MG PO TABS: 25.0000 mg | ORAL_TABLET | Freq: Every day | ORAL | 0 refills | Status: DC

## 2023-07-07 MED ORDER — ACETAMINOPHEN 500 MG PO TABS
1000.0000 mg | ORAL_TABLET | Freq: Once | ORAL | Status: AC
  Administered 2023-07-07: 1000 mg via ORAL
  Filled 2023-07-07: qty 2

## 2023-07-07 MED ORDER — HYDROCHLOROTHIAZIDE 25 MG PO TABS
25.0000 mg | ORAL_TABLET | Freq: Once | ORAL | Status: AC
  Administered 2023-07-07: 25 mg via ORAL
  Filled 2023-07-07: qty 1

## 2023-07-07 NOTE — Discharge Instructions (Addendum)
RESOURCE GUIDE  Chronic Pain Problems: Contact [facility]  [phone] Patients need to be referred by their primary care doctor.  Insufficient Money for Medicine: Contact [employer]:  call [phone]  No Primary Care Doctor: Call [facility]  [phone] - can help you locate a primary care doctor that  accepts your insurance, provides certain services, etc. Physician Referral Service- [phone]  Agencies that provide inexpensive medical care: [facility]  [phone] [facility]  [phone] [facility]  [phone] [facility]  [phone] [facility]  [phone] [facility]  [phone]  Medicaid-accepting [facility] Providers: [facility]- [address]  [phone], Mon-Fri 9am-7pm, Sat 9am-1pm [facility] [address] [place], [address] [place]  [phone] [facility]- [address], [address] [place]  [phone] [facility]- [address]  [phone] Renaye Rakers- [address] [place], [address], [phone]  Only accepts [place] patients after they have their name  applied to their card  Self Pay (no insurance) in [facility]: Sickle Cell Patients - [facility]  [address] [place], [phone] [facility]- [address] [place]  [phone]       [facility] [place]- [address]       -     [facility]- see information above (Speak to [employer] if you do not have insurance)       -  [facility]- [address],  [phone]       -  [facility]- [address], [phone]       -  Dr Julio Sicks-  [address], [place], [phone]       -  [facility] - [address] [phone]       -  [facility]- [address] [phone], also [address],  [phone]       -     [facility]- [address] [place], [phone], 1st & 3rd Saturday         every month, 10am-1pm  -     [facility]   [address], [place].   Phone:  [phone], Fax:  [phone]. Hours of Operation:  9 am - 6 pm, M-F.  -     [facility]   [address],    Phone: [phone], Fax: [phone]. Hours of Operation:  8:30 am - 5:30 pm, M-F.    Dental Assistance If unable to pay or uninsured, contact:  [facility]. to become qualified for the adult dental clinic.  Patients with Medicaid: [facility] [phone] [address], [phone] [address], [phone]  If unable to pay, or uninsured, contact [facility] [phone] in [place], [phone] in [facility]) to become qualified for the adult dental clinic  [facility] [address] [place], [place] [address] [phone] [email or web address]  Other Proofreader Services: [facility]- [address] [place], [place], [place], [address], [phone], Ext. [phone], 2nd and 4th Thursday of the month at 6:30am.  10 clients each day by appointment, can sometimes see walk-in patients if someone does not show for an appointment.  [facility]- [address] [place], [place], [address], [phone] [facility] [address] [place], [place], [address], [phone] [facility] Department- [phone] [facility] Health Department- [phone] [facility] [phone]    In regards to your leg swelling went recommend you wear compression stockings, keep legs elevated when sitting down or lying down.  Limit salt intake.  It was a pleasure caring for you today in the emergency department.  Please return to the emergency department for any worsening or worrisome symptoms.

## 2023-07-07 NOTE — ED Triage Notes (Signed)
Patient presents to ED via EMS from [facility] c/o bilateral leg pain. Patient reports chronic leg pain and does not have a doctor. Patient reports he got kicked out of the shelter because he didn't have an ID. Patient is aggressive in triage and reports "I am just in pain dammit". Patient in NAD, airway intact.

## 2023-07-07 NOTE — ED Provider Notes (Signed)
[place] EMERGENCY DEPARTMENT AT [facility] Provider Note  CSN: [id number] Arrival date & time: 07/07/23 0250  Chief Complaint(s) Leg Pain and Homeless  HPI Ruble Wilden is a 62 y.o. male with past medical history as below, significant for GERD, cocaine abuse, esophagitis, alcohol abuse, homeless who presents to the ED with complaint of leg pain    Patient is homeless, reports that he was kicked out of the shelter this evening because he lost his ID.  He called EMS from the shelter.  He reports has been having leg pain for years, it is worse when he walks around or when it is cold outside.  He does not take any medications.  Not taking Protonix or HCTZ that he was prescribed previously.  He has no dyspnea or chest pain.  Chronic leg pain for many years, roughly unchanged.  Reports he feels he is currently at his baseline  Past Medical History Past Medical History:  Diagnosis Date   Abdominal pain, other specified site    Abnormal CT scan, esophagus 03/2012   GERD (gastroesophageal reflux disease)    Substance abuse (HCC) 03/2012   tox screen positive for cocaine.    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   Duodenitis 04/02/2016   GI bleed 04/02/2016   Personality disorder (HCC) 10/20/2014   Acute upper GI bleed    Ileus, postoperative (HCC) 10/15/2014   Aspiration pneumonia (HCC) 10/14/2014   Polysubstance abuse (HCC) 10/09/2014   UGIB (upper gastrointestinal bleed) 10/08/2014   Leukocytosis 04/30/2013   Microcytic anemia 04/30/2013   Hematemesis 08/10/2012   Anemia due to GI blood loss 08/10/2012   Acute respiratory failure (HCC) 08/10/2012   Hypertension 08/10/2012   Duodenal ulcer with  hemorrhage s/p oversew 10/11/2014 08/10/2012   Encephalopathy acute 08/10/2012   Home Medication(s) Prior to Admission medications   Medication Sig Start Date End Date Taking? Authorizing Provider  hydrochlorothiazide (HYDRODIURIL) 25 MG tablet Take 1 tablet (25 mg total) by mouth daily. 07/07/23 08/06/23 Yes Tanda Rockers A, DO  pantoprazole (PROTONIX) 40 MG tablet Take 1 tablet (40 mg total) by mouth 2 (two) times daily. 07/07/23 08/06/23 Yes Sloan Leiter, DO  bismuth-metronidazole-tetracycline [facility]) [phone] MG capsule Take 3 capsules by mouth 4 (four) times daily -  before meals and at bedtime. Patient not taking: Reported on 06/26/2017 08/11/16   Joseph Art, DO  folic acid (FOLVITE) 1 MG tablet Take 1 tablet (1 mg total) by mouth daily. Patient not taking: Reported on 07/30/2016 04/06/16   [name], DO  hydrochlorothiazide (HYDRODIURIL) 25 MG tablet Take 1 tablet (25 mg total) by mouth daily. 03/15/19   Sabas Sous, MD  Multiple Vitamins-Minerals (MULTIVITAMIN) tablet Take 1 tablet by mouth daily. Patient not taking: Reported on 08/08/2016 04/06/16   [name], DO  ondansetron (ZOFRAN ODT) 4 MG disintegrating tablet Take 1 tablet (4 mg total) by mouth every 8 (eight) hours as needed for nausea or vomiting. Patient not taking: Reported on 06/26/2017 08/09/16   Horton, Mayer Masker, MD  pantoprazole (PROTONIX) 40 MG tablet Take 1 tablet (40 mg total) by mouth 2 (two) times daily. Patient not taking: Reported on 06/26/2017 08/11/16  Joseph Art, DO  thiamine (VITAMIN B-1) 100 MG tablet Take 1 tablet (100 mg total) by mouth daily. Patient not taking: Reported on 07/30/2016 04/06/16   [name], DO                                                                                                                                    Past Surgical History Past Surgical History:  Procedure Laterality Date   ESOPHAGOGASTRODUODENOSCOPY  08/10/2012   Procedure:  ESOPHAGOGASTRODUODENOSCOPY (EGD);  Surgeon: [name], MD;  Location: [facility] ENDOSCOPY;  Service: Endoscopy;  Laterality: N/A;  will be done in room 1    ESOPHAGOGASTRODUODENOSCOPY Left 05/01/2013   Procedure: ESOPHAGOGASTRODUODENOSCOPY (EGD);  Surgeon: [name], MD;  Location: [facility] ENDOSCOPY;  Service: Endoscopy;  Laterality: Left;   ESOPHAGOGASTRODUODENOSCOPY N/A 05/11/2013   Procedure: ESOPHAGOGASTRODUODENOSCOPY (EGD);  Surgeon: [name], MD;  Location: [facility] ENDOSCOPY;  Service: Endoscopy;  Laterality: N/A;   ESOPHAGOGASTRODUODENOSCOPY N/A 10/06/2014   Procedure: ESOPHAGOGASTRODUODENOSCOPY (EGD);  Surgeon: [name], MD;  Location: [facility] ENDOSCOPY;  Service: Endoscopy;  Laterality: N/A;   ESOPHAGOGASTRODUODENOSCOPY N/A 10/09/2014   Procedure: ESOPHAGOGASTRODUODENOSCOPY (EGD);  Surgeon: [name], MD;  Location: [facility] ENDOSCOPY;  Service: Gastroenterology;  Laterality: N/A;  Bedside   ESOPHAGOGASTRODUODENOSCOPY N/A 04/03/2016   Procedure: ESOPHAGOGASTRODUODENOSCOPY (EGD);  Surgeon: [name], MD;  Location: [facility] ORS;  Service: Gastroenterology;  Laterality: N/A;   ESOPHAGOGASTRODUODENOSCOPY (EGD) WITH PROPOFOL N/A 08/10/2016   Procedure: ESOPHAGOGASTRODUODENOSCOPY (EGD) WITH PROPOFOL;  Surgeon: [name], MD;  Location: [facility] ENDOSCOPY;  Service: Gastroenterology;  Laterality: N/A;   LAPAROTOMY  08/10/2012   Procedure: EXPLORATORY LAPAROTOMY;  Surgeon: [name], MD;  Location: [facility] OR;  Service: General;  Laterality: N/A;   LAPAROTOMY N/A 10/10/2014   Procedure: EXPLORATORY LAPAROTOMY WITH GASTROPATHY AND OVER SEWEN DUODENAL ULCER REGION ;  Surgeon: [name], MD;  Location: [facility] ORS;  Service: General;  Laterality: N/A;   undescended testicle     on right. noted on 03/2012 CT scan.  no surgery referral.    Family History History reviewed. No pertinent family history.  Social History Social History   Tobacco Use   Smoking status: Every Day    Current packs/day: 1.00    Average  packs/day: 1 pack/day for 34.0 years (34.0 ttl pk-yrs)    Types: Cigarettes   Smokeless tobacco: Never  Substance Use Topics   Alcohol use: Yes    Comment: 40oz beer/day   Drug use: Yes    Types: Marijuana    Comment: occ - "$5 bag" "only when I got money"   Allergies Nsaids, Chocolate, Peanut-containing drug products, and Spinach  Review of Systems Review of Systems  Constitutional:  Negative for chills and fever.  Respiratory:  Negative for chest tightness and shortness of breath.   Cardiovascular:  Positive for leg swelling. Negative for chest pain and palpitations.  Gastrointestinal:  Negative for abdominal pain  and nausea.  Musculoskeletal:  Positive for arthralgias.  Neurological:  Negative for syncope and headaches.  All other systems reviewed and are negative.   Physical Exam Vital Signs  I have reviewed the triage vital signs BP (!) 177/67   Pulse 76   Temp 98.5 F (36.9 C) (Oral)   Resp 16   Ht 5\' 10"  (1.778 m)   Wt 79.4 kg   SpO2 98%   BMI 25.11 kg/m  Physical Exam Vitals and nursing note reviewed.  Constitutional:      General: He is not in acute distress.    Appearance: Normal appearance. He is well-developed. He is not ill-appearing.  HENT:     Head: Normocephalic and atraumatic.     Right Ear: External ear normal.     Left Ear: External ear normal.     Nose: Nose normal.     Mouth/Throat:     Mouth: Mucous membranes are moist.  Eyes:     General: No scleral icterus.       Right eye: No discharge.        Left eye: No discharge.  Cardiovascular:     Rate and Rhythm: Normal rate.  Pulmonary:     Effort: Pulmonary effort is normal. No respiratory distress.     Breath sounds: No stridor.  Abdominal:     General: Abdomen is flat. There is no distension.     Tenderness: There is no guarding.  Musculoskeletal:        General: No deformity.     Cervical back: No rigidity.     Right lower leg: Edema present.     Left lower leg: Edema present.      Comments: Symmetric swelling to lower extremities.  LE NVI  Skin:    General: Skin is warm and dry.     Coloration: Skin is not cyanotic, jaundiced or pale.  Neurological:     Mental Status: He is alert and oriented to person, place, and time.     GCS: GCS eye subscore is 4. GCS verbal subscore is 5. GCS motor subscore is 6.  Psychiatric:        Speech: Speech normal.        Behavior: Behavior normal. Behavior is cooperative.     ED Results and Treatments Labs (all labs ordered are listed, but only abnormal results are displayed) Labs Reviewed - No data to display                                                                                                                        Radiology No results found.  Pertinent labs & imaging results that were available during my care of the patient were reviewed by me and considered in my medical decision making (see MDM for details).  Medications Ordered in ED Medications  acetaminophen (TYLENOL) tablet 1,000 mg (1,000 mg Oral Given 07/07/23 0334)  hydrochlorothiazide (HYDRODIURIL) tablet 25 mg (25 mg Oral Given 07/07/23 0334)  Procedures Procedures  (including critical care time)  Medical Decision Making / ED Course    Medical Decision Making:    Tynell Hollinghead is a 62 y.o. male with past medical history as below, significant for GERD, cocaine abuse, esophagitis, alcohol abuse, homeless who presents to the ED with complaint of leg pain. The complaint involves an extensive differential diagnosis and also carries with it a high risk of complications and morbidity.  Serious etiology was considered. Ddx includes but is not limited to: Venous insufficiency, less likely cardiac or renal etiology, less likely VTE.  Complete initial physical exam performed, notably the patient was in no acute distress, blood  pressure is elevated, patient reports he is not taking his blood pressure medication in over 1 year.    Reviewed and confirmed nursing documentation for past medical history, family history, social history.  Vital signs reviewed.      Clinical Course as of 07/07/23 0628  Fri Jul 07, 2023  0627 BP elevated on arrival, non-adherent to bp medication. Has improved after home dose of HCTZ [SG]    Clinical Course User Index [SG] Sloan Leiter, DO    Brief summary: 62 yo male, patient is homeless, he is here because he was kicked out of the shelter; brought here by EMS.  Chronic pain to his legs, unchanged.  Favor likely venous insufficiency, doubt VTE or infection, doubt acute cardiac or renal abnormality.  Encouraged conservative management including compression stockings, elevation, follow-up PCP.  He also has elevated blood pressure, has not taken his blood pressure medication in greater than 12 months.  He has no chest pain or dyspnea, no palpitations, no syncope or headache.  Patient was given Tylenol, given something to eat.  Given resources for local shelters.  I have given him refill on Protonix and hydrochlorothiazide, stressed importance of compliance.   Instructed on elevation of his legs, use Ace wraps to keep the legs wrapped as he cannot afford compression stockings.  Take hydrochlorothiazide. Follow-up with primary care.  The patient improved significantly and was discharged in stable condition. Detailed discussions were had with the patient regarding current findings, and need for close f/u with PCP or on call doctor. The patient has been instructed to return immediately if the symptoms worsen in any way for re-evaluation. Patient verbalized understanding and is in agreement with current care plan. All questions answered prior to discharge.                  Additional history obtained: -Additional history obtained from na -External records from outside source  obtained and reviewed including: Chart review including previous notes, labs, imaging, consultation notes including  Prior ed visits Prior imaging Home meds   Lab Tests: na  EKG   EKG Interpretation Date/Time:    Ventricular Rate:    PR Interval:    QRS Duration:    QT Interval:    QTC Calculation:   R Axis:      Text Interpretation:           Imaging Studies ordered: na   Medicines ordered and prescription drug management: Meds ordered this encounter  Medications   acetaminophen (TYLENOL) tablet 1,000 mg   hydrochlorothiazide (HYDRODIURIL) 25 MG tablet    Sig: Take 1 tablet (25 mg total) by mouth daily.    Dispense:  30 tablet    Refill:  0   pantoprazole (PROTONIX) 40 MG tablet    Sig: Take 1 tablet (40 mg total) by mouth 2 (  two) times daily.    Dispense:  60 tablet    Refill:  0   hydrochlorothiazide (HYDRODIURIL) tablet 25 mg    -I have reviewed the patients home medicines and have made adjustments as needed   Consultations Obtained: na   Cardiac Monitoring: Continuous pulse oximetry interpreted by myself, 100% on RA.    Social Determinants of Health:  Diagnosis or treatment significantly limited by social determinants of health: current smoker, alcohol use, polysubstance abuse, and homelessness Counseled patient for approximately 3 minutes regarding smoking cessation. Discussed risks of smoking and how they applied and affected their visit here today. Patient not ready to quit at this time, however will follow up with their primary doctor when they are.   CPT code: 16109: intermediate counseling for smoking cessation     Reevaluation: After the interventions noted above, I reevaluated the patient and found that they have improved  Co morbidities that complicate the patient evaluation  Past Medical History:  Diagnosis Date   Abdominal pain, other specified site    Abnormal CT scan, esophagus 03/2012   GERD (gastroesophageal reflux disease)     Substance abuse (HCC) 03/2012   tox screen positive for cocaine.       Dispostion: Disposition decision including need for hospitalization was considered, and patient discharged from emergency department.    Final Clinical Impression(s) / ED Diagnoses Final diagnoses:  Homeless  Chronic pain of both lower extremities        Sloan Leiter, DO 07/07/23 6045

## 2023-07-07 NOTE — ED Notes (Signed)
Patient reports he has a place to go. Patient given taxi voucher by charge nurse. Patient has all personal belongings.

## 2023-07-31 ENCOUNTER — Telehealth: Payer: Self-pay | Admitting: Medical Oncology

## 2023-07-31 NOTE — Telephone Encounter (Signed)
err

## 2024-01-05 ENCOUNTER — Telehealth: Payer: Self-pay | Admitting: Internal Medicine

## 2024-01-05 NOTE — Telephone Encounter (Signed)
Left a voicemail with appointment details

## 2024-01-11 ENCOUNTER — Other Ambulatory Visit: Payer: Self-pay

## 2024-01-11 ENCOUNTER — Inpatient Hospital Stay: Payer: MEDICAID | Attending: Internal Medicine | Admitting: Internal Medicine

## 2024-01-11 NOTE — Progress Notes (Signed)
 Called patient due to not showing up for his 1330 appointment. No answer to call and unable to leave a VM. Will no show patient and reschedule appointment.

## 2024-03-16 ENCOUNTER — Inpatient Hospital Stay (HOSPITAL_COMMUNITY)
Admission: EM | Admit: 2024-03-16 | Discharge: 2024-03-18 | DRG: 392 | Discharge status: Against Medical Advice | Payer: MEDICAID | Attending: Internal Medicine | Admitting: Internal Medicine

## 2024-03-16 ENCOUNTER — Other Ambulatory Visit: Payer: Self-pay

## 2024-03-16 ENCOUNTER — Encounter (HOSPITAL_COMMUNITY): Payer: Self-pay

## 2024-03-16 ENCOUNTER — Emergency Department (HOSPITAL_COMMUNITY): Payer: MEDICAID

## 2024-03-16 DIAGNOSIS — E860 Dehydration: Secondary | ICD-10-CM | POA: Diagnosis present

## 2024-03-16 DIAGNOSIS — K2980 Duodenitis without bleeding: Secondary | ICD-10-CM | POA: Diagnosis present

## 2024-03-16 DIAGNOSIS — R531 Weakness: Secondary | ICD-10-CM | POA: Diagnosis not present

## 2024-03-16 DIAGNOSIS — Z886 Allergy status to analgesic agent status: Secondary | ICD-10-CM | POA: Diagnosis not present

## 2024-03-16 DIAGNOSIS — Z5900 Homelessness unspecified: Secondary | ICD-10-CM

## 2024-03-16 DIAGNOSIS — Z91018 Allergy to other foods: Secondary | ICD-10-CM

## 2024-03-16 DIAGNOSIS — K449 Diaphragmatic hernia without obstruction or gangrene: Secondary | ICD-10-CM | POA: Diagnosis present

## 2024-03-16 DIAGNOSIS — E876 Hypokalemia: Secondary | ICD-10-CM | POA: Diagnosis present

## 2024-03-16 DIAGNOSIS — K2990 Gastroduodenitis, unspecified, without bleeding: Secondary | ICD-10-CM

## 2024-03-16 DIAGNOSIS — Z8711 Personal history of peptic ulcer disease: Secondary | ICD-10-CM

## 2024-03-16 DIAGNOSIS — I10 Essential (primary) hypertension: Secondary | ICD-10-CM | POA: Diagnosis present

## 2024-03-16 DIAGNOSIS — Z5329 Procedure and treatment not carried out because of patient's decision for other reasons: Secondary | ICD-10-CM | POA: Diagnosis present

## 2024-03-16 DIAGNOSIS — Z91010 Allergy to peanuts: Secondary | ICD-10-CM | POA: Diagnosis not present

## 2024-03-16 DIAGNOSIS — I700 Atherosclerosis of aorta: Secondary | ICD-10-CM | POA: Diagnosis present

## 2024-03-16 DIAGNOSIS — R2681 Unsteadiness on feet: Secondary | ICD-10-CM | POA: Diagnosis present

## 2024-03-16 DIAGNOSIS — F1010 Alcohol abuse, uncomplicated: Secondary | ICD-10-CM | POA: Diagnosis present

## 2024-03-16 DIAGNOSIS — N179 Acute kidney failure, unspecified: Secondary | ICD-10-CM | POA: Diagnosis present

## 2024-03-16 DIAGNOSIS — F17210 Nicotine dependence, cigarettes, uncomplicated: Secondary | ICD-10-CM | POA: Diagnosis present

## 2024-03-16 DIAGNOSIS — K529 Noninfective gastroenteritis and colitis, unspecified: Principal | ICD-10-CM | POA: Diagnosis present

## 2024-03-16 DIAGNOSIS — Z5321 Procedure and treatment not carried out due to patient leaving prior to being seen by health care provider: Secondary | ICD-10-CM | POA: Diagnosis not present

## 2024-03-16 DIAGNOSIS — K625 Hemorrhage of anus and rectum: Principal | ICD-10-CM | POA: Diagnosis present

## 2024-03-16 LAB — COMPREHENSIVE METABOLIC PANEL WITH GFR
ALT: 18 U/L (ref 0–44)
AST: 24 U/L (ref 15–41)
Albumin: 3.2 g/dL — ABNORMAL LOW (ref 3.5–5.0)
Alkaline Phosphatase: 44 U/L (ref 38–126)
Anion gap: 13 (ref 5–15)
BUN: 40 mg/dL — ABNORMAL HIGH (ref 8–23)
CO2: 24 mmol/L (ref 22–32)
Calcium: 8.7 mg/dL — ABNORMAL LOW (ref 8.9–10.3)
Chloride: 98 mmol/L (ref 98–111)
Creatinine, Ser: 1.71 mg/dL — ABNORMAL HIGH (ref 0.61–1.24)
GFR, Estimated: 44 mL/min — ABNORMAL LOW (ref >60)
Glucose, Bld: 114 mg/dL — ABNORMAL HIGH (ref 70–99)
Potassium: 3 mmol/L — ABNORMAL LOW (ref 3.5–5.1)
Sodium: 135 mmol/L (ref 135–145)
Total Bilirubin: 0.5 mg/dL (ref 0.0–1.2)
Total Protein: 6.6 g/dL (ref 6.5–8.1)

## 2024-03-16 LAB — URINALYSIS, ROUTINE W REFLEX MICROSCOPIC
Bacteria, UA: NONE SEEN
Bilirubin Urine: NEGATIVE
Glucose, UA: NEGATIVE mg/dL
Ketones, ur: NEGATIVE mg/dL
Leukocytes,Ua: NEGATIVE
Nitrite: NEGATIVE
Protein, ur: NEGATIVE mg/dL
Specific Gravity, Urine: 1.041 — ABNORMAL HIGH (ref 1.005–1.030)
pH: 6 (ref 5.0–8.0)

## 2024-03-16 LAB — CBC
HCT: 47.5 % (ref 39.0–52.0)
Hemoglobin: 15.1 g/dL (ref 13.0–17.0)
MCH: 26.4 pg (ref 26.0–34.0)
MCHC: 31.8 g/dL (ref 30.0–36.0)
MCV: 83.2 fL (ref 80.0–100.0)
Platelets: 159 K/uL (ref 150–400)
RBC: 5.71 MIL/uL (ref 4.22–5.81)
RDW: 13.6 % (ref 11.5–15.5)
WBC: 7.5 K/uL (ref 4.0–10.5)
nRBC: 0 % (ref 0.0–0.2)

## 2024-03-16 LAB — TYPE AND SCREEN
ABO/RH(D): O NEG
Antibody Screen: NEGATIVE

## 2024-03-16 LAB — RAPID URINE DRUG SCREEN, HOSP PERFORMED
Amphetamines: NOT DETECTED
Barbiturates: NOT DETECTED
Benzodiazepines: NOT DETECTED
Cocaine: POSITIVE — AB
Opiates: POSITIVE — AB
Tetrahydrocannabinol: NOT DETECTED

## 2024-03-16 LAB — CK: Total CK: 130 U/L (ref 49–397)

## 2024-03-16 LAB — TROPONIN I (HIGH SENSITIVITY)
Troponin I (High Sensitivity): 9 ng/L (ref <18)
Troponin I (High Sensitivity): 6 ng/L (ref <18)

## 2024-03-16 LAB — LIPASE, BLOOD: Lipase: 26 U/L (ref 11–51)

## 2024-03-16 MED ORDER — TRAZODONE HCL 50 MG PO TABS
25.0000 mg | ORAL_TABLET | Freq: Every evening | ORAL | Status: DC | PRN
  Administered 2024-03-16: 25 mg via ORAL
  Filled 2024-03-16: qty 1

## 2024-03-16 MED ORDER — LORAZEPAM 1 MG PO TABS
1.0000 mg | ORAL_TABLET | ORAL | Status: DC | PRN
  Administered 2024-03-16: 4 mg via ORAL
  Administered 2024-03-17: 2 mg via ORAL
  Administered 2024-03-18 (×2): 1 mg via ORAL
  Filled 2024-03-16: qty 2
  Filled 2024-03-16: qty 1
  Filled 2024-03-16: qty 4
  Filled 2024-03-16: qty 1

## 2024-03-16 MED ORDER — LACTATED RINGERS IV BOLUS
1000.0000 mL | Freq: Once | INTRAVENOUS | Status: AC
  Administered 2024-03-16: 1000 mL via INTRAVENOUS

## 2024-03-16 MED ORDER — ACETAMINOPHEN 325 MG PO TABS: 650.0000 mg | ORAL_TABLET | Freq: Four times a day (QID) | ORAL | Status: DC | PRN

## 2024-03-16 MED ORDER — LORAZEPAM 2 MG/ML IJ SOLN: 1.0000 mg | INTRAMUSCULAR | Status: DC | PRN

## 2024-03-16 MED ORDER — POTASSIUM CHLORIDE CRYS ER 20 MEQ PO TBCR
40.0000 meq | EXTENDED_RELEASE_TABLET | Freq: Once | ORAL | Status: AC
  Administered 2024-03-16: 40 meq via ORAL
  Filled 2024-03-16: qty 2

## 2024-03-16 MED ORDER — ONDANSETRON HCL 4 MG/2ML IJ SOLN: 4.0000 mg | Freq: Four times a day (QID) | INTRAMUSCULAR | Status: DC | PRN

## 2024-03-16 MED ORDER — THIAMINE MONONITRATE 100 MG PO TABS
100.0000 mg | ORAL_TABLET | Freq: Every day | ORAL | Status: DC
  Administered 2024-03-16 – 2024-03-18 (×3): 100 mg via ORAL
  Filled 2024-03-16 (×3): qty 1

## 2024-03-16 MED ORDER — ACETAMINOPHEN 650 MG RE SUPP
650.0000 mg | Freq: Four times a day (QID) | RECTAL | Status: DC | PRN
Start: 2024-03-16 — End: 2024-03-18

## 2024-03-16 MED ORDER — PANTOPRAZOLE SODIUM 40 MG IV SOLR
40.0000 mg | Freq: Two times a day (BID) | INTRAVENOUS | Status: DC
  Administered 2024-03-17 – 2024-03-18 (×3): 40 mg via INTRAVENOUS
  Filled 2024-03-16 (×3): qty 10

## 2024-03-16 MED ORDER — ONDANSETRON HCL 4 MG PO TABS: 4.0000 mg | ORAL_TABLET | Freq: Four times a day (QID) | ORAL | Status: DC | PRN

## 2024-03-16 MED ORDER — FOLIC ACID 1 MG PO TABS
1.0000 mg | ORAL_TABLET | Freq: Every day | ORAL | Status: DC
  Administered 2024-03-16 – 2024-03-18 (×3): 1 mg via ORAL
  Filled 2024-03-16 (×3): qty 1

## 2024-03-16 MED ORDER — HYDROCHLOROTHIAZIDE 25 MG PO TABS
25.0000 mg | ORAL_TABLET | Freq: Every day | ORAL | Status: DC
  Administered 2024-03-16 – 2024-03-17 (×2): 25 mg via ORAL
  Filled 2024-03-16 (×2): qty 1

## 2024-03-16 MED ORDER — THIAMINE HCL 100 MG/ML IJ SOLN
100.0000 mg | Freq: Every day | INTRAMUSCULAR | Status: DC
  Filled 2024-03-16 (×2): qty 2

## 2024-03-16 MED ORDER — POTASSIUM CHLORIDE CRYS ER 20 MEQ PO TBCR
40.0000 meq | EXTENDED_RELEASE_TABLET | Freq: Two times a day (BID) | ORAL | Status: DC
  Administered 2024-03-16 – 2024-03-18 (×4): 40 meq via ORAL
  Filled 2024-03-16 (×4): qty 2

## 2024-03-16 MED ORDER — NICOTINE 21 MG/24HR TD PT24
21.0000 mg | MEDICATED_PATCH | Freq: Every day | TRANSDERMAL | Status: DC
  Administered 2024-03-16 – 2024-03-18 (×3): 21 mg via TRANSDERMAL
  Filled 2024-03-16 (×3): qty 1

## 2024-03-16 MED ORDER — PANTOPRAZOLE SODIUM 40 MG IV SOLR
40.0000 mg | Freq: Once | INTRAVENOUS | Status: AC
  Administered 2024-03-16: 40 mg via INTRAVENOUS
  Filled 2024-03-16: qty 10

## 2024-03-16 MED ORDER — IOHEXOL 300 MG/ML  SOLN
80.0000 mL | Freq: Once | INTRAMUSCULAR | Status: AC | PRN
  Administered 2024-03-16: 80 mL via INTRAVENOUS

## 2024-03-16 MED ORDER — LACTATED RINGERS IV SOLN: INTRAVENOUS | Status: AC

## 2024-03-16 MED ORDER — METOPROLOL TARTRATE 5 MG/5ML IV SOLN: 5.0000 mg | Freq: Four times a day (QID) | INTRAVENOUS | Status: DC | PRN

## 2024-03-16 MED ORDER — ONDANSETRON HCL 4 MG/2ML IJ SOLN
4.0000 mg | Freq: Four times a day (QID) | INTRAMUSCULAR | Status: DC | PRN
Start: 2024-03-16 — End: 2024-03-18

## 2024-03-16 MED ORDER — ADULT MULTIVITAMIN W/MINERALS CH
1.0000 | ORAL_TABLET | Freq: Every day | ORAL | Status: DC
  Administered 2024-03-16 – 2024-03-18 (×3): 1 via ORAL
  Filled 2024-03-16 (×3): qty 1

## 2024-03-16 MED ORDER — MORPHINE SULFATE (PF) 4 MG/ML IV SOLN
4.0000 mg | Freq: Once | INTRAVENOUS | Status: AC
  Administered 2024-03-16: 4 mg via INTRAVENOUS
  Filled 2024-03-16: qty 1

## 2024-03-16 NOTE — Assessment & Plan Note (Signed)
 Markedly elevated BUN as well likely related dehydration IV fluid hydration Avoid nephrotoxic agents Trend

## 2024-03-16 NOTE — ED Triage Notes (Addendum)
 Pt BIB [facility] found in a field, Symptoms started Tuesday- bowel movements have been bright red with white spots. Pt also noted rash to inner thighs and buttocks. A&O x 4. Hx GI bleed. Reports abd pain and N/V/D. Vomited x 3 today.

## 2024-03-16 NOTE — H&P (Signed)
 History and Physical    Patient: [name] FMW:[id number] DOB: 1960/10/08 DOA: 03/16/2024 DOS: the patient was seen and examined on 03/16/2024 PCP: Catalina Bare, MD  Patient coming from: Homeless  Chief Complaint:  Chief Complaint  Patient presents with   Rectal Bleeding   HPI: [name] is a 63 y.o. male with medical history significant of extensive history of alcohol use, homelessness, recurrent upper GI bleeding with multiple EGDs the last which was in 2018 returns today with rectal bleeding which has been ongoing since Thursday.  He notes mostly just oozing of blood from his rectum that he is unable to control.  He also notes abdominal pain with nausea and vomiting and diarrhea.  He has vomited x 3 today.  Review of Systems: As mentioned in the history of present illness. All other systems reviewed and are negative.  Except for a rash on his groin and buttocks Past Medical History:  Diagnosis Date   Abdominal pain, other specified site    Abnormal CT scan, esophagus 03/2012   GERD (gastroesophageal reflux disease)    Substance abuse (HCC) 03/2012   tox screen positive for cocaine.    Past Surgical History:  Procedure Laterality Date   ESOPHAGOGASTRODUODENOSCOPY  08/10/2012   Procedure: ESOPHAGOGASTRODUODENOSCOPY (EGD);  Surgeon: Toribio SHAUNNA Cedar, MD;  Location: [facility] ENDOSCOPY;  Service: Endoscopy;  Laterality: N/A;  will be done in room 1    ESOPHAGOGASTRODUODENOSCOPY Left 05/01/2013   Procedure: ESOPHAGOGASTRODUODENOSCOPY (EGD);  Surgeon: Renaye Sous, MD;  Location: [facility] ENDOSCOPY;  Service: Endoscopy;  Laterality: Left;   ESOPHAGOGASTRODUODENOSCOPY N/A 05/11/2013   Procedure: ESOPHAGOGASTRODUODENOSCOPY (EGD);  Surgeon: Lesta JULIANNA Fitz, MD;  Location: [facility] ENDOSCOPY;  Service: Endoscopy;  Laterality: N/A;   ESOPHAGOGASTRODUODENOSCOPY N/A 10/06/2014   Procedure: ESOPHAGOGASTRODUODENOSCOPY (EGD);  Surgeon: Gordy CHRISTELLA Starch, MD;  Location: [facility] ENDOSCOPY;  Service: Endoscopy;  Laterality: N/A;    ESOPHAGOGASTRODUODENOSCOPY N/A 10/09/2014   Procedure: ESOPHAGOGASTRODUODENOSCOPY (EGD);  Surgeon: Gordy CHRISTELLA Starch, MD;  Location: [facility] ENDOSCOPY;  Service: Gastroenterology;  Laterality: N/A;  Bedside   ESOPHAGOGASTRODUODENOSCOPY N/A 04/03/2016   Procedure: ESOPHAGOGASTRODUODENOSCOPY (EGD);  Surgeon: Kavitha V Nandigam, MD;  Location: [facility] ORS;  Service: Gastroenterology;  Laterality: N/A;   ESOPHAGOGASTRODUODENOSCOPY (EGD) WITH PROPOFOL  N/A 08/10/2016   Procedure: ESOPHAGOGASTRODUODENOSCOPY (EGD) WITH PROPOFOL ;  Surgeon: Gordy CHRISTELLA Starch, MD;  Location: [facility] ENDOSCOPY;  Service: Gastroenterology;  Laterality: N/A;   LAPAROTOMY  08/10/2012   Procedure: EXPLORATORY LAPAROTOMY;  Surgeon: Lynwood MALVA Pina, MD;  Location: [facility] OR;  Service: General;  Laterality: N/A;   LAPAROTOMY N/A 10/10/2014   Procedure: EXPLORATORY LAPAROTOMY WITH GASTROPATHY AND OVER SEWEN DUODENAL ULCER REGION ;  Surgeon: Donnice Lunger, MD;  Location: [facility] ORS;  Service: General;  Laterality: N/A;   undescended testicle     on right. noted on 03/2012 CT scan.  no surgery referral.    Social History:  reports that he has been smoking. He has a 34 pack-year smoking history. He has never used smokeless tobacco. He reports current alcohol use. He reports current drug use. Drug: Marijuana.  Allergies  Allergen Reactions   Nsaids Other (See Comments)    BLEEDING ULCERS  Did not acknowledge an intolerance to this (in 2025)   Chocolate Nausea And Vomiting and Other (See Comments)    Did not acknowledge an intolerance to this (in 2025)   Peanut-Containing Drug Products Nausea And Vomiting and Other (See Comments)    Did not acknowledge an intolerance to this (in 2025)   Spinach Nausea And Vomiting and Other (  See Comments)    Did not acknowledge an intolerance to this (in 2025)    History reviewed. No pertinent family history.  Prior to Admission medications   Medication Sig Start Date End Date Taking? Authorizing Provider   bismuth-metronidazole-tetracycline [facility] 140-125-125 MG capsule Take 3 capsules by mouth 4 (four) times daily -  before meals and at bedtime. Patient not taking: Reported on 03/16/2024 08/11/16   Vann, Jessica U, DO  folic acid  (FOLVITE ) 1 MG tablet Take 1 tablet (1 mg total) by mouth daily. Patient not taking: Reported on 03/16/2024 04/06/16   Mikhail, Maryann, DO  hydrochlorothiazide  (HYDRODIURIL ) 25 MG tablet Take 1 tablet (25 mg total) by mouth daily. Patient not taking: Reported on 03/16/2024 03/15/19   Theadore Ozell HERO, MD  hydrochlorothiazide  (HYDRODIURIL ) 25 MG tablet Take 1 tablet (25 mg total) by mouth daily. Patient not taking: Reported on 03/16/2024 07/07/23 03/16/24  Elnor Jayson LABOR, DO  Multiple Vitamins-Minerals (MULTIVITAMIN) tablet Take 1 tablet by mouth daily. Patient not taking: Reported on 03/16/2024 04/06/16   Mikhail, Maryann, DO  ondansetron  (ZOFRAN  ODT) 4 MG disintegrating tablet Take 1 tablet (4 mg total) by mouth every 8 (eight) hours as needed for nausea or vomiting. Patient not taking: Reported on 03/16/2024 08/09/16   Horton, Charmaine FALCON, MD  pantoprazole  (PROTONIX ) 40 MG tablet Take 1 tablet (40 mg total) by mouth 2 (two) times daily. Patient not taking: Reported on 03/16/2024 08/11/16   Vann, Jessica U, DO  pantoprazole  (PROTONIX ) 40 MG tablet Take 1 tablet (40 mg total) by mouth 2 (two) times daily. Patient not taking: Reported on 03/16/2024 07/07/23 03/16/24  Elnor Jayson LABOR, DO  thiamine  (VITAMIN B-1) 100 MG tablet Take 1 tablet (100 mg total) by mouth daily. Patient not taking: Reported on 03/16/2024 04/06/16   Jeannett Liming, DO    Physical Exam: Vitals:   03/16/24 1426 03/16/24 1429 03/16/24 1800  BP: (!) 140/85  (!) 164/84  Pulse: 86  73  Resp: 16  20  Temp: (!) 97.5 F (36.4 C)    TempSrc: Oral    SpO2: 97%  97%  Weight:  74.8 kg   Height:  5' 9 (1.753 m)    Physical Examination: General appearance - dehydrated and chronically ill appearing Mental status - alert,  oriented to person, place, and time Neck - supple, no significant adenopathy Chest - clear to auscultation, no wheezes, rales or rhonchi, symmetric air entry Heart - normal rate, regular rhythm, normal S1, S2, no murmurs, rubs, clicks or gallops Abdomen - soft, nontender, nondistended, no masses or organomegaly Extremities -chronic edematous changes in his lower extremities bilaterally  Data Reviewed: Results for orders placed or performed during the hospital encounter of 03/16/24 (from the past 24 hours)  Type and screen [facility]     Status: None   Collection Time: 03/16/24  2:45 PM  Result Value Ref Range   ABO/RH(D) O NEG    Antibody Screen NEG    Sample Expiration      03/19/2024,2359 Performed at [facility], [address], [place], [place] [address]   Comprehensive metabolic panel     Status: Abnormal   Collection Time: 03/16/24  2:48 PM  Result Value Ref Range   Sodium 135 135 - 145 mmol/L   Potassium 3.0 (L) 3.5 - 5.1 mmol/L   Chloride 98 98 - 111 mmol/L   CO2 24 22 - 32 mmol/L   Glucose, Bld 114 (H) 70 - 99 mg/dL  BUN 40 (H) 8 - 23 mg/dL   Creatinine, Ser 8.28 (H) 0.61 - 1.24 mg/dL   Calcium  8.7 (L) 8.9 - 10.3 mg/dL   Total Protein 6.6 6.5 - 8.1 g/dL   Albumin 3.2 (L) 3.5 - 5.0 g/dL   AST 24 15 - 41 U/L   ALT 18 0 - 44 U/L   Alkaline Phosphatase 44 38 - 126 U/L   Total Bilirubin 0.5 0.0 - 1.2 mg/dL   GFR, Estimated 44 (L) >60 mL/min   Anion gap 13 5 - 15  CBC     Status: None   Collection Time: 03/16/24  2:48 PM  Result Value Ref Range   WBC 7.5 4.0 - 10.5 K/uL   RBC 5.71 4.22 - 5.81 MIL/uL   Hemoglobin 15.1 13.0 - 17.0 g/dL   HCT 52.4 60.9 - 47.9 %   MCV 83.2 80.0 - 100.0 fL   MCH 26.4 26.0 - 34.0 pg   MCHC 31.8 30.0 - 36.0 g/dL   RDW 86.3 88.4 - 84.4 %   Platelets 159 150 - 400 K/uL   nRBC 0.0 0.0 - 0.2 %  Lipase, blood     Status: None   Collection Time: 03/16/24  2:48 PM  Result Value Ref Range   Lipase 26  11 - 51 U/L  Troponin I (High Sensitivity)     Status: None   Collection Time: 03/16/24  2:48 PM  Result Value Ref Range   Troponin I (High Sensitivity) 9 <18 ng/L  CK     Status: None   Collection Time: 03/16/24  2:48 PM  Result Value Ref Range   Total CK 130 49 - 397 U/L  Troponin I (High Sensitivity)     Status: None   Collection Time: 03/16/24  5:27 PM  Result Value Ref Range   Troponin I (High Sensitivity) 6 <18 ng/L  Urinalysis, Routine w reflex microscopic -Urine, Clean Catch     Status: Abnormal   Collection Time: 03/16/24  7:47 PM  Result Value Ref Range   Color, Urine YELLOW YELLOW   APPearance CLEAR CLEAR   Specific Gravity, Urine 1.041 (H) 1.005 - 1.030   pH 6.0 5.0 - 8.0   Glucose, UA NEGATIVE NEGATIVE mg/dL   Hgb urine dipstick MODERATE (A) NEGATIVE   Bilirubin Urine NEGATIVE NEGATIVE   Ketones, ur NEGATIVE NEGATIVE mg/dL   Protein, ur NEGATIVE NEGATIVE mg/dL   Nitrite NEGATIVE NEGATIVE   Leukocytes,Ua NEGATIVE NEGATIVE   RBC / HPF 0-5 0 - 5 RBC/hpf   WBC, UA 0-5 0 - 5 WBC/hpf   Bacteria, UA NONE SEEN NONE SEEN   Squamous Epithelial / HPF 0-5 0 - 5 /HPF   Hyaline Casts, UA PRESENT    CT ABDOMEN PELVIS W CONTRAST Result Date: 03/16/2024 CLINICAL DATA:  Abdominal pain, acute, nonlocalized. Nausea, vomiting, and diarrhea. Bright red blood with white spots in bowel movements. Rash in inner thighs and buttocks. EXAM: CT ABDOMEN AND PELVIS WITH CONTRAST TECHNIQUE: Multidetector CT imaging of the abdomen and pelvis was performed using the standard protocol following bolus administration of intravenous contrast. RADIATION DOSE REDUCTION: This exam was performed according to the departmental dose-optimization program which includes automated exposure control, adjustment of the mA and/or kV according to patient size and/or use of iterative reconstruction technique. CONTRAST:  80mL OMNIPAQUE  IOHEXOL  300 MG/ML  SOLN COMPARISON:  04/02/2016. FINDINGS: Lower chest: Emphysematous  changes and mild atelectasis or scarring are present at the lung bases. Hepatobiliary: No focal liver abnormality  is seen. No gallstones, gallbladder wall thickening, or biliary dilatation. Pancreas: There is pancreatic ductal dilatation measuring 5 mm. No surrounding inflammatory changes. Spleen: The adrenal glands are within normal limits. The kidneys enhance symmetrically. No renal calculus or hydronephrosis. The bladder is unremarkable. Adrenals/Urinary Tract: The adrenal glands are within normal limits. The kidneys enhance symmetrically. No renal calculus or hydronephrosis bilaterally. The bladder is unremarkable. Stomach/Bowel: There is a small hiatal hernia. Mild gastric and proximal duodenum wall thickening with surrounding inflammatory changes are present. No bowel obstruction is seen. There is diffuse colonic and rectal wall thickening with surrounding fat stranding, most pronounced at the rectum. Tiny foci of air are noted in the anterior aspect of the rectum, axial images 67 and 68, which may or may not be extraluminal. Appendix is not seen. Vascular/Lymphatic: Aortic atherosclerosis. Enlarged lymph nodes are present in the inguinal regions bilaterally, may be reactive. Prominent lymph nodes are noted in the right lower quadrant, which may be reactive. Reproductive: Prostate is unremarkable. Other: No abdominopelvic ascites. Musculoskeletal: Degenerative changes are present in the thoracolumbar spine. No acute osseous abnormality is seen. IMPRESSION: 1. Diffuse colonic wall and rectal wall thickening with surrounding fat stranding, most pronounced at the rectum, compatible with colitis/proctitis. A few tiny foci of air are seen in the anterior aspect of the rectum which may or may not be extraluminal and the possibility of free air cannot be excluded. 2. Gastric and proximal duodenum wall thickening, suggesting gastritis/duodenitis. 3. Pancreatic ductal dilatation measuring 5 mm, new from the previous  exam. 4. Small hiatal hernia. 5. Aortic atherosclerosis. Electronically Signed   By: Leita Birmingham M.D.   On: 03/16/2024 17:27     Assessment and Plan: * Colitis Likely cause of GI bleeding GI consult General Surgery reviewed images and did not think there was free air as was noted on CT and declined consultation at this time GI panel to rule out infectious etiology  Acute kidney injury (HCC) Markedly elevated BUN as well likely related dehydration IV fluid hydration Avoid nephrotoxic agents Trend  Hypokalemia P.o. repletion Trend  Rectal bleeding Stable hemoglobin Trend  Alcohol abuse CIWA protocol  History of alcohol abuse CIWA protocol  Duodenitis Likely related to alcohol use.  It is unclear how much alcohol he drinks as he is not very forthcoming with his answers.  In the setting of concomitant gastritis we will give twice daily PPI. GI consultation  Hypertension Resume HCTZ which she does not take when he is not in the hospital as he does not have funds to get this medication.      Advance Care Planning:   Code Status: Prior full  Consults: GI, GEN surg  Family Communication: Patient at bedside  Severity of Illness: The appropriate patient status for this patient is INPATIENT. Inpatient status is judged to be reasonable and necessary in order to provide the required intensity of service to ensure the patient's safety. The patient's presenting symptoms, physical exam findings, and initial radiographic and laboratory data in the context of their chronic comorbidities is felt to place them at high risk for further clinical deterioration. Furthermore, it is not anticipated that the patient will be medically stable for discharge from the hospital within 2 midnights of admission.   * I certify that at the point of admission it is my clinical judgment that the patient will require inpatient hospital care spanning beyond 2 midnights from the point of admission due to  high intensity of service, high risk for further  deterioration and high frequency of surveillance required.*  Author: Glenys GORMAN Birk, MD 03/16/2024 8:04 PM  For on call review [email or web address].

## 2024-03-16 NOTE — Assessment & Plan Note (Signed)
 Stable hemoglobin Trend

## 2024-03-16 NOTE — ED Provider Notes (Signed)
 [name] EMERGENCY DEPARTMENT AT [facility] Provider Note   CSN: [id number] Arrival date & time: 03/16/24  1412     History  Chief Complaint  Patient presents with   Rectal Bleeding    [name] is a 63 y.o. male with PMH as listed below who presents BIB [facility] reporting rectal bleeding that started Thursday. Reports bright red blood per rectum and central abdominal pain. Blood mixed with stool/diarrhea.  Also endorses nausea and vomiting without any hematemesis.  Endorses that he normally does drink alcohol but has not had any symptoms like this in the past and does not have any known liver disease.  He denies taking any blood thinners.  Denies any rectal trauma.  Chart review does note history of recurrent upper GI bleeding with multiple EGDs in the past 2018.   Past Medical History:  Diagnosis Date   Abdominal pain, other specified site    Abnormal CT scan, esophagus 03/2012   GERD (gastroesophageal reflux disease)    Substance abuse (HCC) 03/2012   tox screen positive for cocaine.        Home Medications Prior to Admission medications   Medication Sig Start Date End Date Taking? Authorizing Provider  bismuth-metronidazole-tetracycline [facility]) 140-125-125 MG capsule Take 3 capsules by mouth 4 (four) times daily -  before meals and at bedtime. Patient not taking: Reported on 03/16/2024 08/11/16   Vann, Jessica U, DO  folic acid  (FOLVITE ) 1 MG tablet Take 1 tablet (1 mg total) by mouth daily. Patient not taking: Reported on 03/16/2024 04/06/16   Mikhail, Maryann, DO  hydrochlorothiazide  (HYDRODIURIL ) 25 MG tablet Take 1 tablet (25 mg total) by mouth daily. Patient not taking: Reported on 03/16/2024 03/15/19   Theadore Ozell HERO, MD  Multiple Vitamins-Minerals (MULTIVITAMIN) tablet Take 1 tablet by mouth daily. Patient not taking: Reported on 03/16/2024 04/06/16   Mikhail, Maryann, DO  ondansetron  (ZOFRAN  ODT) 4 MG disintegrating tablet Take 1 tablet (4 mg total) by mouth  every 8 (eight) hours as needed for nausea or vomiting. Patient not taking: Reported on 03/16/2024 08/09/16   Horton, Charmaine FALCON, MD  pantoprazole  (PROTONIX ) 40 MG tablet Take 1 tablet (40 mg total) by mouth 2 (two) times daily. Patient not taking: Reported on 03/16/2024 08/11/16   Vann, Jessica U, DO  thiamine  (VITAMIN B-1) 100 MG tablet Take 1 tablet (100 mg total) by mouth daily. Patient not taking: Reported on 03/16/2024 04/06/16   Mikhail, Maryann, DO      Allergies    Nsaids, Chocolate, Peanut-containing drug products, and Spinach    Review of Systems   Review of Systems A 10 point review of systems was performed and is negative unless otherwise reported in HPI.  Physical Exam Updated Vital Signs BP (!) 154/82 (BP Location: Right Arm)   Pulse 69   Temp (!) 97.5 F (36.4 C) (Oral)   Resp 20   Ht 5' 9 (1.753 m)   Wt 74.8 kg   SpO2 99%   BMI 24.37 kg/m  Physical Exam General: Normal appearing male, lying in bed.  HEENT: PERRLA, Sclera anicteric, MMM, trachea midline.  Cardiology: RRR, no murmurs/rubs/gallops. BL radial and DP pulses equal bilaterally.  Resp: Normal respiratory rate and effort. CTAB, no wheezes, rhonchi, crackles.  Abd: Soft, non-tender, non-distended. No rebound tenderness or guarding.  GU: Deferred. MSK: No peripheral edema or signs of trauma. Extremities without deformity or TTP. No cyanosis or clubbing. Skin: warm, dry. No rashes or lesions. Back: No CVA tenderness Neuro:  A&Ox4, CNs II-XII grossly intact. MAEs. Sensation grossly intact.  Psych: Normal mood and affect.   ED Results / Procedures / Treatments   Labs (all labs ordered are listed, but only abnormal results are displayed) Labs Reviewed  COMPREHENSIVE METABOLIC PANEL WITH GFR - Abnormal; Notable for the following components:      Result Value   Potassium 3.0 (*)    Glucose, Bld 114 (*)    BUN 40 (*)    Creatinine, Ser 1.71 (*)    Calcium  8.7 (*)    Albumin 3.2 (*)    GFR, Estimated 44 (*)     All other components within normal limits  URINALYSIS, ROUTINE W REFLEX MICROSCOPIC - Abnormal; Notable for the following components:   Specific Gravity, Urine 1.041 (*)    Hgb urine dipstick MODERATE (*)    All other components within normal limits  RAPID URINE DRUG SCREEN, HOSP PERFORMED - Abnormal; Notable for the following components:   Opiates POSITIVE (*)    Cocaine POSITIVE (*)    All other components within normal limits  GASTROINTESTINAL PANEL BY PCR, STOOL (REPLACES STOOL CULTURE)  C DIFFICILE QUICK SCREEN W PCR REFLEX    CBC  LIPASE, BLOOD  CK  HIV ANTIBODY (ROUTINE TESTING W REFLEX)  TSH  HEMOGLOBIN A1C  COMPREHENSIVE METABOLIC PANEL WITH GFR  PROTIME-INR  CBC  POC OCCULT BLOOD, ED  TYPE AND SCREEN  TROPONIN I (HIGH SENSITIVITY)  TROPONIN I (HIGH SENSITIVITY)    EKG EKG Interpretation Date/Time:  Saturday March 16 2024 14:59:39 EDT Ventricular Rate:  81 PR Interval:  151 QRS Duration:  99 QT Interval:  388 QTC Calculation: 451 R Axis:   97  Text Interpretation: Sinus rhythm Right atrial enlargement Consider left ventricular hypertrophy Repol abnrm in diffuse leads similar to prior ST elevation in anterior latreal leads similar to prior Confirmed by Franklyn Gills [phone]) on 03/16/2024 3:17:29 PM  Radiology CT ABDOMEN PELVIS W CONTRAST Result Date: 03/16/2024 CLINICAL DATA:  Abdominal pain, acute, nonlocalized. Nausea, vomiting, and diarrhea. Bright red blood with white spots in bowel movements. Rash in inner thighs and buttocks. EXAM: CT ABDOMEN AND PELVIS WITH CONTRAST TECHNIQUE: Multidetector CT imaging of the abdomen and pelvis was performed using the standard protocol following bolus administration of intravenous contrast. RADIATION DOSE REDUCTION: This exam was performed according to the departmental dose-optimization program which includes automated exposure control, adjustment of the mA and/or kV according to patient size and/or use of iterative  reconstruction technique. CONTRAST:  80mL OMNIPAQUE  IOHEXOL  300 MG/ML  SOLN COMPARISON:  04/02/2016. FINDINGS: Lower chest: Emphysematous changes and mild atelectasis or scarring are present at the lung bases. Hepatobiliary: No focal liver abnormality is seen. No gallstones, gallbladder wall thickening, or biliary dilatation. Pancreas: There is pancreatic ductal dilatation measuring 5 mm. No surrounding inflammatory changes. Spleen: The adrenal glands are within normal limits. The kidneys enhance symmetrically. No renal calculus or hydronephrosis. The bladder is unremarkable. Adrenals/Urinary Tract: The adrenal glands are within normal limits. The kidneys enhance symmetrically. No renal calculus or hydronephrosis bilaterally. The bladder is unremarkable. Stomach/Bowel: There is a small hiatal hernia. Mild gastric and proximal duodenum wall thickening with surrounding inflammatory changes are present. No bowel obstruction is seen. There is diffuse colonic and rectal wall thickening with surrounding fat stranding, most pronounced at the rectum. Tiny foci of air are noted in the anterior aspect of the rectum, axial images 67 and 68, which may or may not be extraluminal. Appendix is not seen. Vascular/Lymphatic: Aortic atherosclerosis.  Enlarged lymph nodes are present in the inguinal regions bilaterally, may be reactive. Prominent lymph nodes are noted in the right lower quadrant, which may be reactive. Reproductive: Prostate is unremarkable. Other: No abdominopelvic ascites. Musculoskeletal: Degenerative changes are present in the thoracolumbar spine. No acute osseous abnormality is seen. IMPRESSION: 1. Diffuse colonic wall and rectal wall thickening with surrounding fat stranding, most pronounced at the rectum, compatible with colitis/proctitis. A few tiny foci of air are seen in the anterior aspect of the rectum which may or may not be extraluminal and the possibility of free air cannot be excluded. 2. Gastric and  proximal duodenum wall thickening, suggesting gastritis/duodenitis. 3. Pancreatic ductal dilatation measuring 5 mm, new from the previous exam. 4. Small hiatal hernia. 5. Aortic atherosclerosis. Electronically Signed   By: Leita Birmingham M.D.   On: 03/16/2024 17:27    Procedures Procedures    Medications Ordered in ED Medications  potassium chloride  SA (KLOR-CON  M) CR tablet 40 mEq (40 mEq Oral Given 03/16/24 2126)  pantoprazole  (PROTONIX ) injection 40 mg (has no administration in time range)  morphine  (PF) 4 MG/ML injection 4 mg (4 mg Intravenous Given 03/16/24 1609)  pantoprazole  (PROTONIX ) injection 40 mg (40 mg Intravenous Given 03/16/24 1609)  iohexol  (OMNIPAQUE ) 300 MG/ML solution 80 mL (80 mLs Intravenous Contrast Given 03/16/24 1655)  lactated ringers  bolus 1,000 mL (1,000 mLs Intravenous New Bag/Given 03/16/24 1737)  potassium chloride  SA (KLOR-CON  M) CR tablet 40 mEq (40 mEq Oral Given 03/16/24 2014)    ED Course/ Medical Decision Making/ A&P                          Medical Decision Making Amount and/or Complexity of Data Reviewed Labs: ordered. Decision-making details documented in ED Course. Radiology: ordered. Decision-making details documented in ED Course.  Risk Prescription drug management. Decision regarding hospitalization.    This patient presents to the ED for concern of rectal bleeding, this involves an extensive number of treatment options, and is a complaint that carries with it a high risk of complications and morbidity.  I considered the following differential and admission for this acute, potentially life threatening condition.   MDM:    Based on clinical presentation, hematochezia reported and clinical exam history and findings, most concerned about LGIB.   DDx for LGIB includes but is not limited to: Hemorrhoids, diverticulitis/diverticulosis, ischemic colitis, AVMs or Dieulafoy lesions, infectious colitis,  malignancy.  No history of IBD or radiation  exposure. Given alcohol use also consider variceal bleeding. Liver studies are reassuringly wnl.  Patient is hemodynamically stable and normotensive without any signs of hemorrhagic shock.  His hemoglobin is stable reassuringly but will likely need repeat hemoglobin as he has had some bloody bowel movements here in the emergency department.  Creatinine is elevated and AKI, consider possible volume loss especially given nausea vomiting and diarrhea.  Initially reported that he was found in a field although this history is unclear, obtained a troponin as well as a CK which were both reassuringly negative. Due to history of gastritis patient was given IV Protonix  as well for his bleeding.  Also given morphine  and fluids.  Clinical Course as of 03/16/24 2253  Sat Mar 16, 2024  1516 Hemoglobin: 15.1 stable [HN]  1605 Creatinine(!): 1.71 Last labs drawn 5 years ago demonstrate creatinine 0.9.  Could be more chronic but must assume acute AKI [HN]  1605 Lipase: 26 Negative [HN]  1605 Troponin I (High Sensitivity): 9 Negative [  HN]  1605 CK Total: 130 Negative [HN]  1730 CT ABDOMEN PELVIS W CONTRAST 1. Diffuse colonic wall and rectal wall thickening with surrounding fat stranding, most pronounced at the rectum, compatible with colitis/proctitis. A few tiny foci of air are seen in the anterior aspect of the rectum which may or may not be extraluminal and the possibility of free air cannot be excluded. 2. Gastric and proximal duodenum wall thickening, suggesting gastritis/duodenitis. 3. Pancreatic ductal dilatation measuring 5 mm, new from the previous exam. 4. Small hiatal hernia. 5. Aortic atherosclerosis.   [HN]  1730 Scan demonstrating proctitis/colitis as well as gastritis/duodenitis.  Foci of air cannot be excluded.  Will discuss with general surgery. [HN]  1825 Ordered consult for gen surg at 35. Re-engaged with secretary now to page out consult. [HN]  1827 Gen surgeon Dr. Debby  taking a look at his scan.  [HN]  1834 D/w Dr. Debby who states that she sees only intraluminal gas on CT scan. Will consult w/ GI.  [HN]    Clinical Course User Index [HN] Franklyn Sid SAILOR, MD    Labs: I Ordered, and personally interpreted labs.  The pertinent results include: Those listed above  Imaging Studies ordered: I ordered imaging studies including CT abdomen pelvis with contrast I independently visualized and interpreted imaging. I agree with the radiologist interpretation  Additional history obtained from chart review.  Cardiac Monitoring: The patient was maintained on a cardiac monitor.  I personally viewed and interpreted the cardiac monitored which showed an underlying rhythm of: Normal sinus rhythm  Reevaluation: After the interventions noted above, I reevaluated the patient and found that they have :improved  Social Determinants of Health:  houseless  Disposition: Admit to medicine with GI following  Co morbidities that complicate the patient evaluation  Past Medical History:  Diagnosis Date   Abdominal pain, other specified site    Abnormal CT scan, esophagus 03/2012   GERD (gastroesophageal reflux disease)    Substance abuse (HCC) 03/2012   tox screen positive for cocaine.      Medicines Meds ordered this encounter  Medications   morphine  (PF) 4 MG/ML injection 4 mg   pantoprazole  (PROTONIX ) injection 40 mg   DISCONTD: ondansetron  (ZOFRAN ) injection 4 mg   iohexol  (OMNIPAQUE ) 300 MG/ML solution 80 mL   lactated ringers  bolus 1,000 mL   potassium chloride  SA (KLOR-CON  M) CR tablet 40 mEq   potassium chloride  SA (KLOR-CON  M) CR tablet 40 mEq   hydrochlorothiazide  (HYDRODIURIL ) tablet 25 mg   OR Linked Order Group    LORazepam  (ATIVAN ) tablet 1-4 mg     CIWA-AR < 5 =:   0 mg     CIWA-AR 5 -10 =:   1 mg     CIWA-AR 11 -15 =:   2 mg     CIWA-AR 16 -20 =:   3 mg     CIWA-AR 16 -20 =:   Recheck CIWA-AR in 1 hour; if > 20 notify MD     CIWA-AR > 20 =:    4 mg     CIWA-AR > 20 =:   Call Rapid Response    LORazepam  (ATIVAN ) injection 1-4 mg     CIWA-AR < 5 =:   0 mg     CIWA-AR 5 -10 =:   1 mg     CIWA-AR 11 -15 =:   2 mg     CIWA-AR 16 -20 =:   3 mg     CIWA-AR 16 -  20 =:   Recheck CIWA-AR in 1 hour; if > 20 notify MD     CIWA-AR > 20 =:   4 mg     CIWA-AR > 20 =:   Call Rapid Response   OR Linked Order Group    thiamine  (VITAMIN B1) tablet 100 mg    thiamine  (VITAMIN B1) injection 100 mg   folic acid  (FOLVITE ) tablet 1 mg   multivitamin with minerals tablet 1 tablet   lactated ringers  infusion   OR Linked Order Group    acetaminophen  (TYLENOL ) tablet 650 mg    acetaminophen  (TYLENOL ) suppository 650 mg   traZODone  (DESYREL ) tablet 25 mg   OR Linked Order Group    ondansetron  (ZOFRAN ) tablet 4 mg    ondansetron  (ZOFRAN ) injection 4 mg   nicotine  (NICODERM CQ  - dosed in mg/24 hours) patch 21 mg   metoprolol  tartrate (LOPRESSOR ) injection 5 mg   pantoprazole  (PROTONIX ) injection 40 mg    I have reviewed the patients home medicines and have made adjustments as needed  Problem List / ED Course: Problem List Items Addressed This Visit       Digestive   * (Principal) Colitis   Likely cause of GI bleeding GI consult General Surgery reviewed images and did not think there was free air as was noted on CT and declined consultation at this time GI panel to rule out infectious etiology      Rectal bleeding - Primary   Stable hemoglobin Trend      Other Visit Diagnoses       Gastritis and duodenitis         AKI (acute kidney injury) (HCC)                       This note was created using dictation software, which may contain spelling or grammatical errors.    Franklyn Sid SAILOR, MD 03/16/24 2258

## 2024-03-16 NOTE — Assessment & Plan Note (Signed)
 CIWA protocol

## 2024-03-16 NOTE — Assessment & Plan Note (Signed)
 P.o. repletion Trend

## 2024-03-16 NOTE — Assessment & Plan Note (Signed)
 Likely cause of GI bleeding GI consult General Surgery reviewed images and did not think there was free air as was noted on CT and declined consultation at this time GI panel to rule out infectious etiology

## 2024-03-16 NOTE — Assessment & Plan Note (Signed)
 Resume HCTZ which she does not take when he is not in the hospital as he does not have funds to get this medication.

## 2024-03-16 NOTE — Assessment & Plan Note (Addendum)
 Likely related to alcohol use.  It is unclear how much alcohol he drinks as he is not very forthcoming with his answers.  In the setting of concomitant gastritis we will give twice daily PPI. GI consultation

## 2024-03-16 NOTE — ED Notes (Signed)
 Pt unable to provide urine sample at this time

## 2024-03-16 NOTE — Hospital Course (Signed)
 Patient is a 63 year old with extensive history of alcohol use, homelessness, recurrent upper GI bleeding with multiple EGDs the last which was in 2018 returns today with rectal bleeding which has been ongoing since Thursday.  He notes mostly just oozing of blood from his rectum that he is unable to control.  He also notes abdominal pain with nausea and vomiting and diarrhea.  He has vomited x 3 today.

## 2024-03-17 DIAGNOSIS — K529 Noninfective gastroenteritis and colitis, unspecified: Secondary | ICD-10-CM

## 2024-03-17 LAB — COMPREHENSIVE METABOLIC PANEL WITH GFR
ALT: 15 U/L (ref 0–44)
AST: 15 U/L (ref 15–41)
Albumin: 2.8 g/dL — ABNORMAL LOW (ref 3.5–5.0)
Alkaline Phosphatase: 41 U/L (ref 38–126)
Anion gap: 9 (ref 5–15)
BUN: 22 mg/dL (ref 8–23)
CO2: 27 mmol/L (ref 22–32)
Calcium: 8.6 mg/dL — ABNORMAL LOW (ref 8.9–10.3)
Chloride: 102 mmol/L (ref 98–111)
Creatinine, Ser: 1.06 mg/dL (ref 0.61–1.24)
GFR, Estimated: >60 mL/min (ref >60)
Glucose, Bld: 102 mg/dL — ABNORMAL HIGH (ref 70–99)
Potassium: 3.1 mmol/L — ABNORMAL LOW (ref 3.5–5.1)
Sodium: 138 mmol/L (ref 135–145)
Total Bilirubin: 0.7 mg/dL (ref 0.0–1.2)
Total Protein: 5.9 g/dL — ABNORMAL LOW (ref 6.5–8.1)

## 2024-03-17 LAB — GASTROINTESTINAL PANEL BY PCR, STOOL (REPLACES STOOL CULTURE)
Adenovirus F40/41: NOT DETECTED
Astrovirus: NOT DETECTED
Campylobacter species: NOT DETECTED
Cryptosporidium: NOT DETECTED
Cyclospora cayetanensis: NOT DETECTED
Entamoeba histolytica: NOT DETECTED
Enteroaggregative E coli (EAEC): DETECTED — AB
Enteropathogenic E coli (EPEC): DETECTED — AB
Enterotoxigenic E coli (ETEC): DETECTED — AB
Giardia lamblia: NOT DETECTED
Norovirus GI/GII: NOT DETECTED
Plesimonas shigelloides: NOT DETECTED
Rotavirus A: NOT DETECTED
Salmonella species: NOT DETECTED
Sapovirus (I, II, IV, and V): NOT DETECTED
Shiga like toxin producing E coli (STEC): NOT DETECTED
Shigella/Enteroinvasive E coli (EIEC): DETECTED — AB
Vibrio cholerae: NOT DETECTED
Vibrio species: NOT DETECTED
Yersinia enterocolitica: NOT DETECTED

## 2024-03-17 LAB — HIV ANTIBODY (ROUTINE TESTING W REFLEX): HIV Screen 4th Generation wRfx: NONREACTIVE

## 2024-03-17 LAB — CBC
HCT: 46.6 % (ref 39.0–52.0)
Hemoglobin: 14.8 g/dL (ref 13.0–17.0)
MCH: 26.9 pg (ref 26.0–34.0)
MCHC: 31.8 g/dL (ref 30.0–36.0)
MCV: 84.6 fL (ref 80.0–100.0)
Platelets: 159 K/uL (ref 150–400)
RBC: 5.51 MIL/uL (ref 4.22–5.81)
RDW: 13.7 % (ref 11.5–15.5)
WBC: 7 K/uL (ref 4.0–10.5)
nRBC: 0 % (ref 0.0–0.2)

## 2024-03-17 LAB — TSH: TSH: 0.407 u[IU]/mL (ref 0.350–4.500)

## 2024-03-17 LAB — C DIFFICILE QUICK SCREEN W PCR REFLEX
C Diff antigen: NEGATIVE
C Diff interpretation: NOT DETECTED
C Diff toxin: NEGATIVE

## 2024-03-17 LAB — PROTIME-INR
INR: 1 (ref 0.8–1.2)
Prothrombin Time: 14 s (ref 11.4–15.2)

## 2024-03-17 LAB — MAGNESIUM: Magnesium: 2.3 mg/dL (ref 1.7–2.4)

## 2024-03-17 MED ORDER — PEG 3350-KCL-NA BICARB-NACL 420 G PO SOLR
4000.0000 mL | Freq: Once | ORAL | Status: AC
  Administered 2024-03-17: 4000 mL via ORAL

## 2024-03-17 MED ORDER — HYDRALAZINE HCL 20 MG/ML IJ SOLN: 5.0000 mg | Freq: Four times a day (QID) | INTRAMUSCULAR | Status: DC | PRN

## 2024-03-17 MED ORDER — SODIUM CHLORIDE 0.9 % IV SOLN: INTRAVENOUS | Status: DC

## 2024-03-17 MED ORDER — BISACODYL 5 MG PO TBEC
10.0000 mg | DELAYED_RELEASE_TABLET | Freq: Once | ORAL | Status: AC
  Administered 2024-03-17: 10 mg via ORAL
  Filled 2024-03-17: qty 2

## 2024-03-17 NOTE — Progress Notes (Signed)
 PROGRESS NOTE    [name]  FMW:[id number] DOB: [birth year] DOA: 03/16/2024 PCP: Catalina Bare, MD    Brief Narrative:  Patient is a 63 year old with extensive history of alcohol use, homelessness, recurrent upper GI bleeding with multiple EGDs the last which was in 2018 returns today with rectal bleeding which has been ongoing since Thursday.  He notes mostly just oozing of blood from his rectum that he is unable to control.  He also notes abdominal pain with nausea and vomiting and diarrhea.  He has vomited x 3 today.    Assessment and Plan: ?Colitis Likely cause of GI bleeding GI consult appreciated General Surgery reviewed images and did not think there was free air as was noted on CT and declined consultation at this time GI panel to rule out infectious etiology-C. difficile negative, GI pathogen PCR pending  Acute kidney injury (HCC) Resolved  Hypokalemia Replete and recheck  Rectal bleeding Stable hemoglobin Trend GI consult  Alcohol abuse CIWA protocol  Duodenitis Likely related to alcohol use.  It is unclear how much alcohol he drinks as he is not very forthcoming with his answers.  In the setting of concomitant gastritis we will give twice daily PPI. GI consultation  Hypertension -On hydrochlorothiazide  - Added as needed, may need further adjustment if blood pressure continues to be elevated        DVT prophylaxis: SCDs Start: 03/16/24 2019    Code Status: Full Code Family Communication: None at bedside  Disposition Plan:  Level of care: Med-Surg Status is: Inpatient     Consultants:  GI   Subjective: Denies further bleeding, tolerating p.o.  Objective: Vitals:   03/16/24 2132 03/16/24 2225 03/17/24 0038 03/17/24 0547  BP: 114/64 114/64 136/64 (!) 172/73  Pulse: 74 74 73 70  Resp:  17 20 19   Temp:   (!) 97.5 F (36.4 C) 97.6 F (36.4 C)  TempSrc:   Oral Oral  SpO2:  97% 98% 96%  Weight:      Height:        Intake/Output  Summary (Last 24 hours) at 03/17/2024 1129 Last data filed at 03/17/2024 1100 Gross per 24 hour  Intake 757.99 ml  Output 401 ml  Net 356.99 ml   Filed Weights   03/16/24 1429  Weight: 74.8 kg    Examination:   General: Appearance:    Well developed, well nourished male in no acute distress     Lungs:      respirations unlabored  Heart:    Normal heart rate.    MS:   All extremities are intact.    Neurologic:   Awake, alert       Data Reviewed: I have personally reviewed following labs and imaging studies  CBC: Recent Labs  Lab 03/16/24 1448 03/17/24 0542  WBC 7.5 7.0  HGB 15.1 14.8  HCT 47.5 46.6  MCV 83.2 84.6  PLT 159 159   Basic Metabolic Panel: Recent Labs  Lab 03/16/24 1448 03/17/24 0542  NA 135 138  K 3.0* 3.1*  CL 98 102  CO2 24 27  GLUCOSE 114* 102*  BUN 40* 22  CREATININE 1.71* 1.06  CALCIUM  8.7* 8.6*  MG  --  2.3   GFR: Estimated Creatinine Clearance: 71.3 mL/min (by C-G formula based on SCr of 1.06 mg/dL). Liver Function Tests: Recent Labs  Lab 03/16/24 1448 03/17/24 0542  AST 24 15  ALT 18 15  ALKPHOS 44 41  BILITOT 0.5 0.7  PROT 6.6 5.9*  ALBUMIN  3.2* 2.8*   Recent Labs  Lab 03/16/24 1448  LIPASE 26   No results for input(s): AMMONIA in the last 168 hours. Coagulation Profile: Recent Labs  Lab 03/17/24 0542  INR 1.0   Cardiac Enzymes: Recent Labs  Lab 03/16/24 1448  CKTOTAL 130   BNP (last 3 results) No results for input(s): PROBNP in the last 8760 hours. HbA1C: No results for input(s): HGBA1C in the last 72 hours. CBG: No results for input(s): GLUCAP in the last 168 hours. Lipid Profile: No results for input(s): CHOL, HDL, LDLCALC, TRIG, CHOLHDL, LDLDIRECT in the last 72 hours. Thyroid Function Tests: Recent Labs    03/16/24 2257  TSH 0.407   Anemia Panel: No results for input(s): VITAMINB12, FOLATE, FERRITIN, TIBC, IRON , RETICCTPCT in the last 72 hours. Sepsis Labs: No  results for input(s): PROCALCITON, LATICACIDVEN in the last 168 hours.  Recent Results (from the past 240 hours)  C Difficile Quick Screen w PCR reflex     Status: None   Collection Time: 03/17/24  9:10 AM   Specimen: STOOL  Result Value Ref Range Status   C Diff antigen NEGATIVE NEGATIVE Final   C Diff toxin NEGATIVE NEGATIVE Final   C Diff interpretation No C. difficile detected.  Final    Comment: Performed at [facility], [address], [place], [place] [address]         Radiology Studies: CT ABDOMEN PELVIS W CONTRAST Result Date: 03/16/2024 CLINICAL DATA:  Abdominal pain, acute, nonlocalized. Nausea, vomiting, and diarrhea. Bright red blood with white spots in bowel movements. Rash in inner thighs and buttocks. EXAM: CT ABDOMEN AND PELVIS WITH CONTRAST TECHNIQUE: Multidetector CT imaging of the abdomen and pelvis was performed using the standard protocol following bolus administration of intravenous contrast. RADIATION DOSE REDUCTION: This exam was performed according to the departmental dose-optimization program which includes automated exposure control, adjustment of the mA and/or kV according to patient size and/or use of iterative reconstruction technique. CONTRAST:  80mL OMNIPAQUE  IOHEXOL  300 MG/ML  SOLN COMPARISON:  04/02/2016. FINDINGS: Lower chest: Emphysematous changes and mild atelectasis or scarring are present at the lung bases. Hepatobiliary: No focal liver abnormality is seen. No gallstones, gallbladder wall thickening, or biliary dilatation. Pancreas: There is pancreatic ductal dilatation measuring 5 mm. No surrounding inflammatory changes. Spleen: The adrenal glands are within normal limits. The kidneys enhance symmetrically. No renal calculus or hydronephrosis. The bladder is unremarkable. Adrenals/Urinary Tract: The adrenal glands are within normal limits. The kidneys enhance symmetrically. No renal calculus or hydronephrosis bilaterally. The bladder  is unremarkable. Stomach/Bowel: There is a small hiatal hernia. Mild gastric and proximal duodenum wall thickening with surrounding inflammatory changes are present. No bowel obstruction is seen. There is diffuse colonic and rectal wall thickening with surrounding fat stranding, most pronounced at the rectum. Tiny foci of air are noted in the anterior aspect of the rectum, axial images 67 and 68, which may or may not be extraluminal. Appendix is not seen. Vascular/Lymphatic: Aortic atherosclerosis. Enlarged lymph nodes are present in the inguinal regions bilaterally, may be reactive. Prominent lymph nodes are noted in the right lower quadrant, which may be reactive. Reproductive: Prostate is unremarkable. Other: No abdominopelvic ascites. Musculoskeletal: Degenerative changes are present in the thoracolumbar spine. No acute osseous abnormality is seen. IMPRESSION: 1. Diffuse colonic wall and rectal wall thickening with surrounding fat stranding, most pronounced at the rectum, compatible with colitis/proctitis. A few tiny foci of air are seen in the anterior aspect of the  rectum which may or may not be extraluminal and the possibility of free air cannot be excluded. 2. Gastric and proximal duodenum wall thickening, suggesting gastritis/duodenitis. 3. Pancreatic ductal dilatation measuring 5 mm, new from the previous exam. 4. Small hiatal hernia. 5. Aortic atherosclerosis. Electronically Signed   By: Leita Birmingham M.D.   On: 03/16/2024 17:27        Scheduled Meds:  folic acid   1 mg Oral Daily   hydrochlorothiazide   25 mg Oral Daily   multivitamin with minerals  1 tablet Oral Daily   nicotine   21 mg Transdermal Daily   pantoprazole  (PROTONIX ) IV  40 mg Intravenous Q12H   potassium chloride   40 mEq Oral BID   thiamine   100 mg Oral Daily   Or   thiamine   100 mg Intravenous Daily   Continuous Infusions:  lactated ringers  100 mL/hr at 03/17/24 0731     LOS: 1 day    Time spent: 45 minutes spent on  chart review, discussion with nursing staff, consultants, updating family and interview/physical exam; more than 50% of that time was spent in counseling and/or coordination of care.    Harlene RAYMOND Bowl, DO [facility] Available via Epic secure chat 7am-7pm After these hours, please refer to coverage provider listed on [email or web address] 03/17/2024, 11:29 AM

## 2024-03-17 NOTE — Consult Note (Addendum)
 [facility] Gastroenterology Consult  Referring Provider: No ref. provider found Primary Care Physician:  Catalina Bare, MD Primary Gastroenterologist: [place]   Reason for Consultation: abnormal CT imaging, rectal bleeding  SUBJECTIVE:   HPI: [name] is a 63 y.o. male with past medical history significant for complex/chronic peptic ulcer disease. EGD 10/06/2014 for anemia and melena showed large non-bleeding duodenal bulb ulcer with visible vessel - no intervention attempted given concern to cause re-bleeding. He subsequently underwent coil embolization of gastroduodenal artery on 10/08/2014 and exploratory laparotomy with over sewing of duodenal ulcer and feeder vessels on 10/11/2014. He underwent EGD 10/09/2015 which showed persistent duodenal ulcer which was treated with hemoclip placement. He was given Rx for H.Pylori as ulcer appeared to be typical presentation of H.Pylori. EGD 08/10/16 for coffee ground emesis showed LA grade D esophagitis, prepyloric gastritis and duodenitis.   Today, he noted that he began to see blood per rectum on 03/14/24. Blood per rectum was painful. He does not take prescription medications. No anticoagulant use. No sick contacts. He has not had painful blood per rectum before. He denied previous colonoscopy. He does not take PPI therapy.   CT scan of abdomen and pelvis showed pancreatic ductal dilatation 5 mm, small hiatal hernia, mild gastric and proximal duodenal wall thickening with surrounding inflammatory changes, diffuse colonic inflammation and proctitis.   C.diff testing negative. GI pathogen panel pending.   Past Medical History:  Diagnosis Date   Abdominal pain, other specified site    Abnormal CT scan, esophagus 03/2012   GERD (gastroesophageal reflux disease)    Substance abuse (HCC) 03/2012   tox screen positive for cocaine.    Past Surgical History:  Procedure Laterality Date   ESOPHAGOGASTRODUODENOSCOPY  08/10/2012   Procedure:  ESOPHAGOGASTRODUODENOSCOPY (EGD);  Surgeon: Toribio SHAUNNA Cedar, MD;  Location: [facility] ENDOSCOPY;  Service: Endoscopy;  Laterality: N/A;  will be done in room 1    ESOPHAGOGASTRODUODENOSCOPY Left 05/01/2013   Procedure: ESOPHAGOGASTRODUODENOSCOPY (EGD);  Surgeon: Renaye Sous, MD;  Location: [facility] ENDOSCOPY;  Service: Endoscopy;  Laterality: Left;   ESOPHAGOGASTRODUODENOSCOPY N/A 05/11/2013   Procedure: ESOPHAGOGASTRODUODENOSCOPY (EGD);  Surgeon: Lesta JULIANNA Fitz, MD;  Location: [facility] ENDOSCOPY;  Service: Endoscopy;  Laterality: N/A;   ESOPHAGOGASTRODUODENOSCOPY N/A 10/06/2014   Procedure: ESOPHAGOGASTRODUODENOSCOPY (EGD);  Surgeon: Gordy CHRISTELLA Starch, MD;  Location: [facility] ENDOSCOPY;  Service: Endoscopy;  Laterality: N/A;   ESOPHAGOGASTRODUODENOSCOPY N/A 10/09/2014   Procedure: ESOPHAGOGASTRODUODENOSCOPY (EGD);  Surgeon: Gordy CHRISTELLA Starch, MD;  Location: [facility];  Service: Gastroenterology;  Laterality: N/A;  Bedside   ESOPHAGOGASTRODUODENOSCOPY N/A 04/03/2016   Procedure: ESOPHAGOGASTRODUODENOSCOPY (EGD);  Surgeon: Kavitha V Nandigam, MD;  Location: [facility] ORS;  Service: Gastroenterology;  Laterality: N/A;   ESOPHAGOGASTRODUODENOSCOPY (EGD) WITH PROPOFOL  N/A 08/10/2016   Procedure: ESOPHAGOGASTRODUODENOSCOPY (EGD) WITH PROPOFOL ;  Surgeon: Gordy CHRISTELLA Starch, MD;  Location: [facility] ENDOSCOPY;  Service: Gastroenterology;  Laterality: N/A;   LAPAROTOMY  08/10/2012   Procedure: EXPLORATORY LAPAROTOMY;  Surgeon: Lynwood MALVA Pina, MD;  Location: [facility] OR;  Service: General;  Laterality: N/A;   LAPAROTOMY N/A 10/10/2014   Procedure: EXPLORATORY LAPAROTOMY WITH GASTROPATHY AND OVER SEWEN DUODENAL ULCER REGION ;  Surgeon: Donnice Lunger, MD;  Location: [facility] ORS;  Service: General;  Laterality: N/A;   undescended testicle     on right. noted on 03/2012 CT scan.  no surgery referral.    Prior to Admission medications   Medication Sig Start Date End Date Taking? Authorizing Provider  bismuth-metronidazole-tetracycline [facility]) 140-125-125 MG capsule Take 3  capsules by mouth 4 (four) times  daily -  before meals and at bedtime. Patient not taking: Reported on 03/16/2024 08/11/16   Vann, Jessica U, DO  folic acid  (FOLVITE ) 1 MG tablet Take 1 tablet (1 mg total) by mouth daily. Patient not taking: Reported on 03/16/2024 04/06/16   Mikhail, Maryann, DO  hydrochlorothiazide  (HYDRODIURIL ) 25 MG tablet Take 1 tablet (25 mg total) by mouth daily. Patient not taking: Reported on 03/16/2024 03/15/19   Theadore Ozell HERO, MD  Multiple Vitamins-Minerals (MULTIVITAMIN) tablet Take 1 tablet by mouth daily. Patient not taking: Reported on 03/16/2024 04/06/16   Mikhail, Maryann, DO  ondansetron  (ZOFRAN  ODT) 4 MG disintegrating tablet Take 1 tablet (4 mg total) by mouth every 8 (eight) hours as needed for nausea or vomiting. Patient not taking: Reported on 03/16/2024 08/09/16   Horton, Charmaine FALCON, MD  pantoprazole  (PROTONIX ) 40 MG tablet Take 1 tablet (40 mg total) by mouth 2 (two) times daily. Patient not taking: Reported on 03/16/2024 08/11/16   Vann, Jessica U, DO  thiamine  (VITAMIN B-1) 100 MG tablet Take 1 tablet (100 mg total) by mouth daily. Patient not taking: Reported on 03/16/2024 04/06/16   Mikhail, Maryann, DO   Current Facility-Administered Medications  Medication Dose Route Frequency Provider Last Rate Last Admin   acetaminophen  (TYLENOL ) tablet 650 mg  650 mg Oral Q6H PRN Pratt, Tanya S, MD       Or   acetaminophen  (TYLENOL ) suppository 650 mg  650 mg Rectal Q6H PRN Pratt, Tanya S, MD       folic acid  (FOLVITE ) tablet 1 mg  1 mg Oral Daily Pratt, Tanya S, MD   1 mg at 03/17/24 9073   hydrALAZINE  (APRESOLINE ) injection 5 mg  5 mg Intravenous Q6H PRN Vann, Jessica U, DO       hydrochlorothiazide  (HYDRODIURIL ) tablet 25 mg  25 mg Oral Daily Pratt, Tanya S, MD   25 mg at 03/17/24 9073   lactated ringers  infusion   Intravenous Continuous Fredirick Glenys RAMAN, MD 100 mL/hr at 03/17/24 0731 New Bag at 03/17/24 0731   LORazepam  (ATIVAN ) tablet 1-4 mg  1-4 mg Oral Q1H PRN Pratt,  Tanya S, MD   4 mg at 03/16/24 2125   Or   LORazepam  (ATIVAN ) injection 1-4 mg  1-4 mg Intravenous Q1H PRN Pratt, Tanya S, MD       metoprolol  tartrate (LOPRESSOR ) injection 5 mg  5 mg Intravenous Q6H PRN Pratt, Tanya S, MD       multivitamin with minerals tablet 1 tablet  1 tablet Oral Daily Pratt, Tanya S, MD   1 tablet at 03/17/24 9074   nicotine  (NICODERM CQ  - dosed in mg/24 hours) patch 21 mg  21 mg Transdermal Daily Pratt, Tanya S, MD   21 mg at 03/17/24 9070   ondansetron  (ZOFRAN ) tablet 4 mg  4 mg Oral Q6H PRN Pratt, Tanya S, MD       Or   ondansetron  (ZOFRAN ) injection 4 mg  4 mg Intravenous Q6H PRN Pratt, Tanya S, MD       pantoprazole  (PROTONIX ) injection 40 mg  40 mg Intravenous Q12H Pratt, Tanya S, MD   40 mg at 03/17/24 9073   potassium chloride  SA (KLOR-CON  M) CR tablet 40 mEq  40 mEq Oral BID Pratt, Tanya S, MD   40 mEq at 03/17/24 9074   thiamine  (VITAMIN B1) tablet 100 mg  100 mg Oral Daily Pratt, Tanya S, MD   100 mg at 03/17/24 9074   Or   thiamine  (VITAMIN B1)  injection 100 mg  100 mg Intravenous Daily Fredirick Glenys RAMAN, MD       traZODone  (DESYREL ) tablet 25 mg  25 mg Oral QHS PRN Pratt, Tanya S, MD   25 mg at 03/16/24 2126   Allergies as of 03/16/2024 - Review Complete 03/16/2024  Allergen Reaction Noted   Nsaids Other (See Comments) 10/20/2014   Chocolate Nausea And Vomiting and Other (See Comments) 04/08/2013   Peanut-containing drug products Nausea And Vomiting and Other (See Comments) 03/16/2013   Spinach Nausea And Vomiting and Other (See Comments) 03/16/2013   History reviewed. No pertinent family history. Social History   Socioeconomic History   Marital status: Single    Spouse name: Not on file   Number of children: Not on file   Years of education: Not on file   Highest education level: Not on file  Occupational History   Not on file  Tobacco Use   Smoking status: Every Day    Current packs/day: 1.00    Average packs/day: 1 pack/day for 34.0 years  (34.0 ttl pk-yrs)    Types: Cigarettes   Smokeless tobacco: Never  Substance and Sexual Activity   Alcohol use: Yes    Comment: 40oz beer/day   Drug use: Yes    Types: Marijuana    Comment: occ - $5 bag only when I got money   Sexual activity: Never  Other Topics Concern   Not on file  Social History Narrative   Not on file   Social Drivers of Health   Financial Resource Strain: Not on file  Food Insecurity: Food Insecurity Present (03/17/2024)   Hunger Vital Sign    Worried About Running Out of Food in the Last Year: Often true    Ran Out of Food in the Last Year: Often true  Transportation Needs: Patient Declined (03/17/2024)   PRAPARE - Administrator, Civil Service (Medical): Patient declined    Lack of Transportation (Non-Medical): Patient declined  Physical Activity: Not on file  Stress: Not on file  Social Connections: Not on file  Intimate Partner Violence: Patient Declined (03/17/2024)   Humiliation, Afraid, Rape, and Kick questionnaire    Fear of Current or Ex-Partner: Patient declined    Emotionally Abused: Patient declined    Physically Abused: Patient declined    Sexually Abused: Patient declined   Review of Systems:  Review of Systems  Respiratory:  Negative for shortness of breath.   Cardiovascular:  Negative for chest pain.  Gastrointestinal:  Positive for blood in stool. Negative for abdominal pain, nausea and vomiting.       Painful defecation.    OBJECTIVE:   Temp:  [97.5 F (36.4 C)-98.2 F (36.8 C)] 98.2 F (36.8 C) (08/17 1222) Pulse Rate:  [69-88] 81 (08/17 1222) Resp:  [16-20] 20 (08/17 1222) BP: (114-172)/(64-95) 163/95 (08/17 1222) SpO2:  [96 %-100 %] 99 % (08/17 1222) Weight:  [74.8 kg] 74.8 kg (08/16 1429) Last BM Date : 03/17/24 Physical Exam Constitutional:      General: He is not in acute distress.    Appearance: He is not ill-appearing, toxic-appearing or diaphoretic.  Cardiovascular:     Rate and Rhythm: Normal  rate and regular rhythm.  Pulmonary:     Effort: No respiratory distress.     Breath sounds: Normal breath sounds.  Abdominal:     General: Bowel sounds are normal. There is no distension.     Palpations: Abdomen is soft.     Tenderness: There  is no abdominal tenderness. There is no guarding.  Neurological:     Mental Status: He is alert.     Labs: Recent Labs    03/16/24 1448 03/17/24 0542  WBC 7.5 7.0  HGB 15.1 14.8  HCT 47.5 46.6  PLT 159 159   BMET Recent Labs    03/16/24 1448 03/17/24 0542  NA 135 138  K 3.0* 3.1*  CL 98 102  CO2 24 27  GLUCOSE 114* 102*  BUN 40* 22  CREATININE 1.71* 1.06  CALCIUM  8.7* 8.6*   LFT Recent Labs    03/17/24 0542  PROT 5.9*  ALBUMIN 2.8*  AST 15  ALT 15  ALKPHOS 41  BILITOT 0.7   PT/INR Recent Labs    03/17/24 0542  LABPROT 14.0  INR 1.0    Diagnostic imaging: CT ABDOMEN PELVIS W CONTRAST Result Date: 03/16/2024 CLINICAL DATA:  Abdominal pain, acute, nonlocalized. Nausea, vomiting, and diarrhea. Bright red blood with white spots in bowel movements. Rash in inner thighs and buttocks. EXAM: CT ABDOMEN AND PELVIS WITH CONTRAST TECHNIQUE: Multidetector CT imaging of the abdomen and pelvis was performed using the standard protocol following bolus administration of intravenous contrast. RADIATION DOSE REDUCTION: This exam was performed according to the departmental dose-optimization program which includes automated exposure control, adjustment of the mA and/or kV according to patient size and/or use of iterative reconstruction technique. CONTRAST:  80mL OMNIPAQUE  IOHEXOL  300 MG/ML  SOLN COMPARISON:  04/02/2016. FINDINGS: Lower chest: Emphysematous changes and mild atelectasis or scarring are present at the lung bases. Hepatobiliary: No focal liver abnormality is seen. No gallstones, gallbladder wall thickening, or biliary dilatation. Pancreas: There is pancreatic ductal dilatation measuring 5 mm. No surrounding inflammatory changes.  Spleen: The adrenal glands are within normal limits. The kidneys enhance symmetrically. No renal calculus or hydronephrosis. The bladder is unremarkable. Adrenals/Urinary Tract: The adrenal glands are within normal limits. The kidneys enhance symmetrically. No renal calculus or hydronephrosis bilaterally. The bladder is unremarkable. Stomach/Bowel: There is a small hiatal hernia. Mild gastric and proximal duodenum wall thickening with surrounding inflammatory changes are present. No bowel obstruction is seen. There is diffuse colonic and rectal wall thickening with surrounding fat stranding, most pronounced at the rectum. Tiny foci of air are noted in the anterior aspect of the rectum, axial images 67 and 68, which may or may not be extraluminal. Appendix is not seen. Vascular/Lymphatic: Aortic atherosclerosis. Enlarged lymph nodes are present in the inguinal regions bilaterally, may be reactive. Prominent lymph nodes are noted in the right lower quadrant, which may be reactive. Reproductive: Prostate is unremarkable. Other: No abdominopelvic ascites. Musculoskeletal: Degenerative changes are present in the thoracolumbar spine. No acute osseous abnormality is seen. IMPRESSION: 1. Diffuse colonic wall and rectal wall thickening with surrounding fat stranding, most pronounced at the rectum, compatible with colitis/proctitis. A few tiny foci of air are seen in the anterior aspect of the rectum which may or may not be extraluminal and the possibility of free air cannot be excluded. 2. Gastric and proximal duodenum wall thickening, suggesting gastritis/duodenitis. 3. Pancreatic ductal dilatation measuring 5 mm, new from the previous exam. 4. Small hiatal hernia. 5. Aortic atherosclerosis. Electronically Signed   By: Leita Birmingham M.D.   On: 03/16/2024 17:27   IMPRESSION: Blood per rectum Proctitis Colitis, diffuse Gastritis Duodenitis, history of duodenal ulcer  -Has required coil embolization of GDA in  2016  -Subsequently required ex lap with oversewing of duodenal ulcer/feeder vessels in 2016 Pancreatic ductal dilatation  PLAN: -  Await stool studies, monitoring off antibiotic therapy  -Recommend EGD and colonoscopy to further evaluate blood per rectum and abnormal CT findings, especially given his history of peptic ulcer disease -Discussed these procedures with patient including benefits, alternatives and risks of bleeding/infection/perforation/missed lesion/anesthesia, he verbalized understanding and elected to proceed -At end of my evaluation, patient was asking for solid food/graham crackers - I reiterated to him the importance of a clear liquid diet to complete bowel preparation for colonoscopy, he verbalized understanding  -Clear liquid diet today, bowel preparation this afternoon, NPO at midnight for procedures 03/18/24 -Dr. Burnette to assume care of patient tomorrow, further recommendations to follow pending procedures -Outpatient follow up/monitoring for pancreatic ductal dilatation   LOS: 1 day   Estefana Keas, DO [facility] Gastroenterology

## 2024-03-17 NOTE — Progress Notes (Addendum)
 Pt refused admission questions  Charge RN present. Pt agitated rambling stating in pain then about how pt mother and grandmother died in hospital, its been years since he has been in a hospital.. don't want to answer questions just want to be better.

## 2024-03-17 NOTE — Consult Note (Signed)
 Disregard, note entered in error.  [facility] covering.

## 2024-03-17 NOTE — Progress Notes (Signed)
 This RN continuously encouraged pt to drink bowel prep since arriving at 1900, pt stated that he is not drinking any more at this time as he doesn't feel as if there's a need for it and became agitated. This RN then educated pt on the need for bowel prep and scheduled procedure for tomorrow, 8/18, pt again refused to cooperate. Pt resting at this time, no obvious signs of distress, breathing is regular, symmetrical and unlabored.

## 2024-03-17 NOTE — Plan of Care (Signed)
  Problem: Clinical Measurements: Goal: Respiratory complications will improve Outcome: Progressing Goal: Cardiovascular complication will be avoided Outcome: Progressing   Problem: Activity: Goal: Risk for activity intolerance will decrease Outcome: Progressing   Problem: Elimination: Goal: Will not experience complications related to urinary retention Outcome: Progressing   Problem: Education: Goal: Knowledge of General Education information will improve Description: Including pain rating scale, medication(s)/side effects and non-pharmacologic comfort measures Outcome: Not Progressing   Problem: Health Behavior/Discharge Planning: Goal: Ability to manage health-related needs will improve Outcome: Not Progressing   Problem: Clinical Measurements: Goal: Ability to maintain clinical measurements within normal limits will improve Outcome: Not Progressing Goal: Will remain free from infection Outcome: Not Progressing Goal: Diagnostic test results will improve Outcome: Not Progressing   Problem: Nutrition: Goal: Adequate nutrition will be maintained Outcome: Not Progressing   Problem: Coping: Goal: Level of anxiety will decrease Outcome: Not Progressing   Problem: Elimination: Goal: Will not experience complications related to bowel motility Outcome: Not Progressing   Problem: Pain Managment: Goal: General experience of comfort will improve and/or be controlled Outcome: Not Progressing   Problem: Safety: Goal: Ability to remain free from injury will improve Outcome: Not Progressing   Problem: Skin Integrity: Goal: Risk for impaired skin integrity will decrease Outcome: Not Progressing

## 2024-03-18 ENCOUNTER — Other Ambulatory Visit: Payer: Self-pay

## 2024-03-18 ENCOUNTER — Other Ambulatory Visit (HOSPITAL_COMMUNITY): Payer: Self-pay

## 2024-03-18 ENCOUNTER — Emergency Department (HOSPITAL_COMMUNITY)
Admission: EM | Admit: 2024-03-18 | Discharge: 2024-03-18 | Discharge status: Against Medical Advice | Payer: MEDICAID | Attending: Emergency Medicine | Admitting: Emergency Medicine

## 2024-03-18 ENCOUNTER — Encounter (HOSPITAL_COMMUNITY): Payer: Self-pay

## 2024-03-18 DIAGNOSIS — R531 Weakness: Secondary | ICD-10-CM | POA: Insufficient documentation

## 2024-03-18 DIAGNOSIS — Z5321 Procedure and treatment not carried out due to patient leaving prior to being seen by health care provider: Secondary | ICD-10-CM | POA: Insufficient documentation

## 2024-03-18 DIAGNOSIS — R2681 Unsteadiness on feet: Secondary | ICD-10-CM | POA: Diagnosis present

## 2024-03-18 LAB — COMPREHENSIVE METABOLIC PANEL WITH GFR
ALT: 14 U/L (ref 0–44)
AST: 14 U/L — ABNORMAL LOW (ref 15–41)
Albumin: 2.9 g/dL — ABNORMAL LOW (ref 3.5–5.0)
Alkaline Phosphatase: 49 U/L (ref 38–126)
Anion gap: 9 (ref 5–15)
BUN: 9 mg/dL (ref 8–23)
CO2: 27 mmol/L (ref 22–32)
Calcium: 8.8 mg/dL — ABNORMAL LOW (ref 8.9–10.3)
Chloride: 98 mmol/L (ref 98–111)
Creatinine, Ser: 1.02 mg/dL (ref 0.61–1.24)
GFR, Estimated: >60 mL/min (ref >60)
Glucose, Bld: 103 mg/dL — ABNORMAL HIGH (ref 70–99)
Potassium: 3.7 mmol/L (ref 3.5–5.1)
Sodium: 134 mmol/L — ABNORMAL LOW (ref 135–145)
Total Bilirubin: 0.6 mg/dL (ref 0.0–1.2)
Total Protein: 6.7 g/dL (ref 6.5–8.1)

## 2024-03-18 LAB — CBC WITH DIFFERENTIAL/PLATELET
Abs Granulocyte: 4 K/uL (ref 1.5–6.5)
Abs Immature Granulocytes: 0.67 K/uL — ABNORMAL HIGH (ref 0.00–0.07)
Basophils Absolute: 0 K/uL (ref 0.0–0.1)
Basophils Relative: 0 %
Eosinophils Absolute: 0.1 K/uL (ref 0.0–0.5)
Eosinophils Relative: 1 %
HCT: 49.2 % (ref 39.0–52.0)
Hemoglobin: 16.2 g/dL (ref 13.0–17.0)
Immature Granulocytes: 8 %
Lymphocytes Relative: 25 %
Lymphs Abs: 2.2 K/uL (ref 0.7–4.0)
MCH: 27.2 pg (ref 26.0–34.0)
MCHC: 32.9 g/dL (ref 30.0–36.0)
MCV: 82.7 fL (ref 80.0–100.0)
Monocytes Absolute: 1.9 K/uL — ABNORMAL HIGH (ref 0.1–1.0)
Monocytes Relative: 22 %
Neutro Abs: 4 K/uL (ref 1.7–7.7)
Neutrophils Relative %: 44 %
Platelets: 191 K/uL (ref 150–400)
RBC: 5.95 MIL/uL — ABNORMAL HIGH (ref 4.22–5.81)
RDW: 13.4 % (ref 11.5–15.5)
Smear Review: NORMAL
WBC: 8.8 K/uL (ref 4.0–10.5)
nRBC: 0 % (ref 0.0–0.2)

## 2024-03-18 LAB — BASIC METABOLIC PANEL WITH GFR
Anion gap: 9 (ref 5–15)
BUN: 9 mg/dL (ref 8–23)
CO2: 28 mmol/L (ref 22–32)
Calcium: 8.6 mg/dL — ABNORMAL LOW (ref 8.9–10.3)
Chloride: 96 mmol/L — ABNORMAL LOW (ref 98–111)
Creatinine, Ser: 0.88 mg/dL (ref 0.61–1.24)
GFR, Estimated: >60 mL/min (ref >60)
Glucose, Bld: 110 mg/dL — ABNORMAL HIGH (ref 70–99)
Potassium: 3.3 mmol/L — ABNORMAL LOW (ref 3.5–5.1)
Sodium: 133 mmol/L — ABNORMAL LOW (ref 135–145)

## 2024-03-18 LAB — CBC
HCT: 48.5 % (ref 39.0–52.0)
Hemoglobin: 15.7 g/dL (ref 13.0–17.0)
MCH: 27.3 pg (ref 26.0–34.0)
MCHC: 32.4 g/dL (ref 30.0–36.0)
MCV: 84.2 fL (ref 80.0–100.0)
Platelets: 184 K/uL (ref 150–400)
RBC: 5.76 MIL/uL (ref 4.22–5.81)
RDW: 13.5 % (ref 11.5–15.5)
WBC: 6.7 K/uL (ref 4.0–10.5)
nRBC: 0 % (ref 0.0–0.2)

## 2024-03-18 LAB — HEMOGLOBIN A1C
Hgb A1c MFr Bld: 5.8 % — ABNORMAL HIGH (ref 4.8–5.6)
Mean Plasma Glucose: 120 mg/dL

## 2024-03-18 SURGERY — EGD (ESOPHAGOGASTRODUODENOSCOPY)
Anesthesia: Monitor Anesthesia Care

## 2024-03-18 MED ORDER — AZITHROMYCIN 250 MG PO TABS: 500.0000 mg | ORAL_TABLET | Freq: Every day | ORAL | Status: DC

## 2024-03-18 MED ORDER — SUCRALFATE 1 G PO TABS
1.0000 g | ORAL_TABLET | Freq: Four times a day (QID) | ORAL | 0 refills | Status: AC
  Filled 2024-03-18 (×2): qty 120, 30d supply, fill #0

## 2024-03-18 MED ORDER — PANTOPRAZOLE SODIUM 40 MG PO TBEC
40.0000 mg | DELAYED_RELEASE_TABLET | Freq: Two times a day (BID) | ORAL | 0 refills | Status: AC
  Filled 2024-03-18: qty 60, 30d supply, fill #0

## 2024-03-18 MED ORDER — AZITHROMYCIN 250 MG PO TABS
250.0000 mg | ORAL_TABLET | Freq: Every day | ORAL | 0 refills | Status: AC
  Filled 2024-03-18: qty 5, 5d supply, fill #0

## 2024-03-18 NOTE — ED Notes (Addendum)
 Rosaline called facility back and stated she is not his caregiver anymore due to patient being combative and non compliant she states she could come speak to patient but she is not transporting him. She stated let him leave AMA, nurse informed her for safety we could not. She also stated to remove her from his emergency contact and to contact his sister serbia.

## 2024-03-18 NOTE — ED Notes (Signed)
 RN left VM for patient sister and caregiver

## 2024-03-18 NOTE — TOC Initial Note (Signed)
 Transition of Care [facility]) - Initial/Assessment Note    Patient Details  Name: [name] MRN: [id number] Date of Birth: 1961/01/23  [facility] CM/SW Contact:    Heather DELENA Saltness, LCSW Phone Number: 03/18/2024, 10:39 AM  Clinical Narrative:                 TOC consulted for SUD counseling resources and housing resources. Pt is currently homeless. Resources added to AVS. TOC will continue to follow for discharge needs.    Expected Discharge Plan: Homeless Shelter Barriers to Discharge: Continued Medical Work up   Patient Goals and CMS Choice Patient states their goals for this hospitalization and ongoing recovery are:: To return to shelter        Expected Discharge Plan and Services In-house Referral: Clinical Social Work Discharge Planning Services: NA Post Acute Care Choice: NA Living arrangements for the past 2 months: Homeless                 DME Arranged: N/A         HH Arranged: NA HH Agency: NA        Prior Living Arrangements/Services Living arrangements for the past 2 months: Homeless Lives with:: Self Patient language and need for interpreter reviewed:: Yes Do you feel safe going back to the place where you live?: No   Homeless  Need for Family Participation in Patient Care: No (Comment) Care giver support system in place?: No (comment)   Criminal Activity/Legal Involvement Pertinent to Current Situation/Hospitalization: No - Comment as needed   Permission Sought/Granted   Permission granted to share information with : No     Emotional Assessment Appearance::  (UTA) Attitude/Demeanor/Rapport: Unable to Assess Affect (typically observed): Unable to Assess Orientation: : Oriented to Self, Oriented to Place, Oriented to  Time, Oriented to Situation Alcohol / Substance Use: Alcohol Use Psych Involvement: No (comment)  Admission diagnosis:  Colitis [K52.9] Patient Active Problem List   Diagnosis Date Noted   Colitis 03/16/2024    Rectal bleeding 03/16/2024   Hypokalemia 03/16/2024   Acute kidney injury (HCC) 03/16/2024   Alcohol abuse 08/09/2016   History of alcohol abuse    Duodenitis 04/02/2016   Personality disorder (HCC) 10/20/2014   Aspiration pneumonia (HCC) 10/14/2014   Polysubstance abuse (HCC) 10/09/2014   Hypertension 08/10/2012   PCP:  Catalina Bare, MD Pharmacy:   [facility] [address] [place] [address] Phone: [phone] Fax: [phone]   Social Drivers of Health (SDOH) Social History: SDOH Screenings   Food Insecurity: Food Insecurity Present (03/17/2024)  Housing: High Risk (03/17/2024)  Transportation Needs: Patient Declined (03/17/2024)  Utilities: Patient Declined (03/17/2024)  Tobacco Use: High Risk (03/16/2024)   SDOH Interventions: Housing resources     Readmission Risk Interventions    03/18/2024   10:36 AM  Readmission Risk Prevention Plan  Transportation Screening Complete  PCP or Specialist Appt within 5-7 Days Complete  Home Care Screening Complete  Medication Review (RN CM) Complete    Signed: Heather Saltness, MSW, LCSW Clinical Social Worker Inpatient Care Management 03/18/2024 10:40 AM

## 2024-03-18 NOTE — Progress Notes (Signed)
 After patient laid on the ground and refused to get up, security was called. Patient stated he is getting out of here,  when asked if he wanted to stay.   Patient was then escorted out of the hospital by security.  Chiquita LULLA Longs, RN

## 2024-03-18 NOTE — Progress Notes (Addendum)
 Patient meds delivered at bedside, [facility] [place] paperwork signed a copy given to patient. IV removed without complications. After signing the AMA paperwork patient decided to lay on the floor in his room.

## 2024-03-18 NOTE — Progress Notes (Signed)
 Discharge meds in a secure bag given to primary nurse.

## 2024-03-18 NOTE — Plan of Care (Signed)
  Problem: Elimination: Goal: Will not experience complications related to bowel motility Outcome: Progressing   Problem: Elimination: Goal: Will not experience complications related to urinary retention Outcome: Progressing   Problem: Clinical Measurements: Goal: Cardiovascular complication will be avoided Outcome: Progressing

## 2024-03-18 NOTE — ED Notes (Signed)
 Pt refusing cbg and vitals, states for me to get the hell away from him.

## 2024-03-18 NOTE — ED Triage Notes (Signed)
 Pt left AMA from the 5th floor, went out to the parking lot and had several falls trying to get on his bike and leave. Pt unsteady on his feet.

## 2024-06-21 NOTE — Progress Notes (Signed)
 Pt attended 05/04/2024 screening event with BP of 136/85. Pt noted at event that he does have a PCP. At event pt did not indicate any SDOH needs. Pt also noted that he is a smoker and listed Private as his insurance at the event.   Pt was mailed letter to address provided in updated event paperwork. Letter included Get [facility] flyer, multiple community primary care clinics flyer, housing resources, smoking cessation flyer, BP management flyer & BP log.  Per chart review pt does have a PCP (unable to confirm and no CHL-visible visits), insurance, and is a smoker. Pt does not indicate any SDOH needs at this time.  Additional pt f/u to be scheduled at this time per health equity protocol.

## 2024-08-09 NOTE — Progress Notes (Signed)
 During initial follow up-Pt attended 05/04/2024 screening event with BP of 136/85. Pt noted at event that he doesn't have a PCP. At event pt did not indicate any SDOH needs. Pt also noted that he is a smoker and listed Private as his insurance at the event.    Pt was mailed letter to address provided in updated event paperwork. Letter included [employer] Care [facility] flyer, multiple community primary care clinics flyer, housing resources, smoking cessation flyer, BP management flyer & BP log.  Per chart review pt does have a PCP (unable to confirm and no CHL-visible visits), insurance, and is a smoker. Pt does not indicate any SDOH needs at this time. Additional pt f/u to be scheduled at this time per health equity protocol. VB  During 60 day follow up- CHW called pt using updated number on the consent form to see if he has a pcp or needs help scheduling one. PT did not answer the phone and there was no option to leave a VM. CHW will resend letter and pcp resources via mail. CHW also sent a sign up link for My[facility] via text. One last follow up attempt will be made at a later date. TW
